# Patient Record
Sex: Female | Born: 1940 | Race: Black or African American | Hispanic: No | Marital: Married | Smoking: Never smoker
Health system: Southern US, Community
[De-identification: ages and names within clinical notes are randomized; demographics above are authoritative.]

## PROBLEM LIST (undated history)

## (undated) DIAGNOSIS — R000 Tachycardia, unspecified: Secondary | ICD-10-CM

## (undated) DIAGNOSIS — N840 Polyp of corpus uteri: Secondary | ICD-10-CM

## (undated) DIAGNOSIS — G4733 Obstructive sleep apnea (adult) (pediatric): Secondary | ICD-10-CM

## (undated) DIAGNOSIS — M1990 Unspecified osteoarthritis, unspecified site: Secondary | ICD-10-CM

## (undated) DIAGNOSIS — E669 Obesity, unspecified: Secondary | ICD-10-CM

## (undated) DIAGNOSIS — Z8742 Personal history of other diseases of the female genital tract: Secondary | ICD-10-CM

## (undated) DIAGNOSIS — I10 Essential (primary) hypertension: Secondary | ICD-10-CM

## (undated) DIAGNOSIS — Z86010 Personal history of colonic polyps: Secondary | ICD-10-CM

## (undated) DIAGNOSIS — E785 Hyperlipidemia, unspecified: Secondary | ICD-10-CM

## (undated) DIAGNOSIS — F99 Mental disorder, not otherwise specified: Secondary | ICD-10-CM

## (undated) HISTORY — DX: Mental disorder, not otherwise specified: F99

## (undated) HISTORY — DX: Obesity, unspecified: E66.9

## (undated) HISTORY — PX: OTHER SURGICAL HISTORY: SHX169

## (undated) HISTORY — PX: TUBAL LIGATION: SHX77

## (undated) HISTORY — DX: Unspecified osteoarthritis, unspecified site: M19.90

## (undated) HISTORY — PX: BACK SURGERY: SHX140

## (undated) HISTORY — PX: CHOLECYSTECTOMY: SHX55

## (undated) HISTORY — DX: Essential (primary) hypertension: I10

## (undated) HISTORY — DX: Obstructive sleep apnea (adult) (pediatric): G47.33

## (undated) HISTORY — DX: Hyperlipidemia, unspecified: E78.5

## (undated) HISTORY — DX: Personal history of colonic polyps: Z86.010

## (undated) HISTORY — DX: Personal history of other diseases of the female genital tract: Z87.42

## (undated) HISTORY — DX: Tachycardia, unspecified: R00.0

## (undated) HISTORY — DX: Polyp of corpus uteri: N84.0

---

## 1997-12-06 DIAGNOSIS — Z86010 Personal history of colonic polyps: Secondary | ICD-10-CM

## 1997-12-06 DIAGNOSIS — Z860101 Personal history of adenomatous and serrated colon polyps: Secondary | ICD-10-CM

## 1997-12-06 HISTORY — DX: Personal history of colonic polyps: Z86.010

## 1997-12-06 HISTORY — DX: Personal history of adenomatous and serrated colon polyps: Z86.0101

## 2001-10-02 ENCOUNTER — Ambulatory Visit (HOSPITAL_COMMUNITY): Admission: RE | Admit: 2001-10-02 | Discharge: 2001-10-02 | Payer: Self-pay | Admitting: *Deleted

## 2001-10-02 ENCOUNTER — Encounter: Payer: Self-pay | Admitting: *Deleted

## 2002-03-06 HISTORY — PX: COLONOSCOPY: SHX174

## 2002-03-21 ENCOUNTER — Ambulatory Visit (HOSPITAL_COMMUNITY): Admission: RE | Admit: 2002-03-21 | Discharge: 2002-03-21 | Payer: Self-pay | Admitting: Internal Medicine

## 2002-06-16 ENCOUNTER — Other Ambulatory Visit: Admission: RE | Admit: 2002-06-16 | Discharge: 2002-06-16 | Payer: Self-pay | Admitting: Obstetrics & Gynecology

## 2002-10-03 ENCOUNTER — Encounter: Payer: Self-pay | Admitting: *Deleted

## 2002-10-03 ENCOUNTER — Ambulatory Visit (HOSPITAL_COMMUNITY): Admission: RE | Admit: 2002-10-03 | Discharge: 2002-10-03 | Payer: Self-pay | Admitting: *Deleted

## 2003-12-25 ENCOUNTER — Ambulatory Visit (HOSPITAL_COMMUNITY): Admission: RE | Admit: 2003-12-25 | Discharge: 2003-12-25 | Payer: Self-pay | Admitting: Family Medicine

## 2004-03-24 ENCOUNTER — Ambulatory Visit (HOSPITAL_COMMUNITY): Admission: RE | Admit: 2004-03-24 | Discharge: 2004-03-24 | Payer: Self-pay | Admitting: Obstetrics & Gynecology

## 2004-12-11 ENCOUNTER — Ambulatory Visit (HOSPITAL_COMMUNITY): Admission: RE | Admit: 2004-12-11 | Discharge: 2004-12-11 | Payer: Self-pay | Admitting: Family Medicine

## 2005-12-13 ENCOUNTER — Ambulatory Visit (HOSPITAL_COMMUNITY): Admission: RE | Admit: 2005-12-13 | Discharge: 2005-12-13 | Payer: Self-pay | Admitting: Family Medicine

## 2006-12-15 ENCOUNTER — Ambulatory Visit (HOSPITAL_COMMUNITY): Admission: RE | Admit: 2006-12-15 | Discharge: 2006-12-15 | Payer: Self-pay | Admitting: Obstetrics & Gynecology

## 2007-08-21 ENCOUNTER — Other Ambulatory Visit: Admission: RE | Admit: 2007-08-21 | Discharge: 2007-08-21 | Payer: Self-pay | Admitting: Obstetrics and Gynecology

## 2007-12-18 ENCOUNTER — Ambulatory Visit (HOSPITAL_COMMUNITY): Admission: RE | Admit: 2007-12-18 | Discharge: 2007-12-18 | Payer: Self-pay | Admitting: Obstetrics & Gynecology

## 2008-09-19 ENCOUNTER — Other Ambulatory Visit: Admission: RE | Admit: 2008-09-19 | Discharge: 2008-09-19 | Payer: Self-pay | Admitting: Obstetrics and Gynecology

## 2008-09-24 ENCOUNTER — Ambulatory Visit (HOSPITAL_COMMUNITY): Admission: RE | Admit: 2008-09-24 | Discharge: 2008-09-24 | Payer: Self-pay | Admitting: Obstetrics & Gynecology

## 2008-09-24 IMAGING — US US TRANSVAGINAL NON-OB
1 series · 13 of 25 positions shown · non-contrast
Comparison: None.

CLINICAL DATA: Post menopausal bleeding.

TRANSABDOMINAL AND TRANSVAGINAL ULTRASOUND OF PELVIS
TECHNIQUE: Both transabdominal and transvaginal ultrasound
examinations of the pelvis were performed including evaluation of
the uterus, ovaries, adnexal regions, and pelvic cul-de-sac.

[Series 1: us transvaginal non-ob · 0.28mm/px · 13 of 73 slices shown]
[im 1/73]
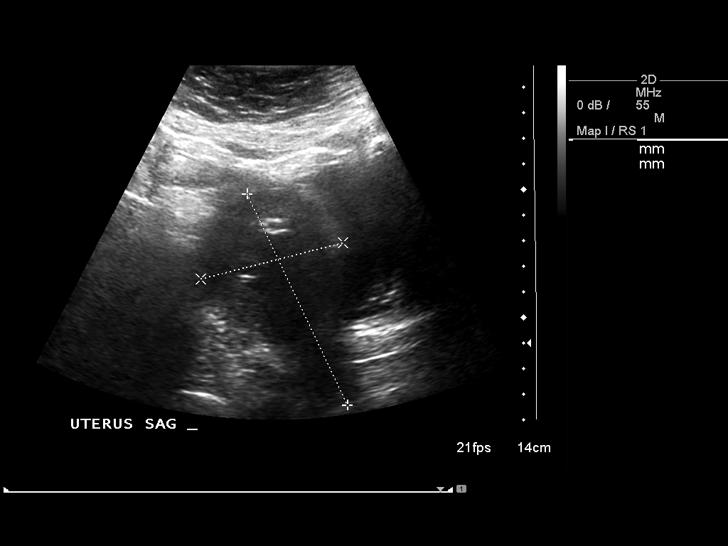
[im 7/73]
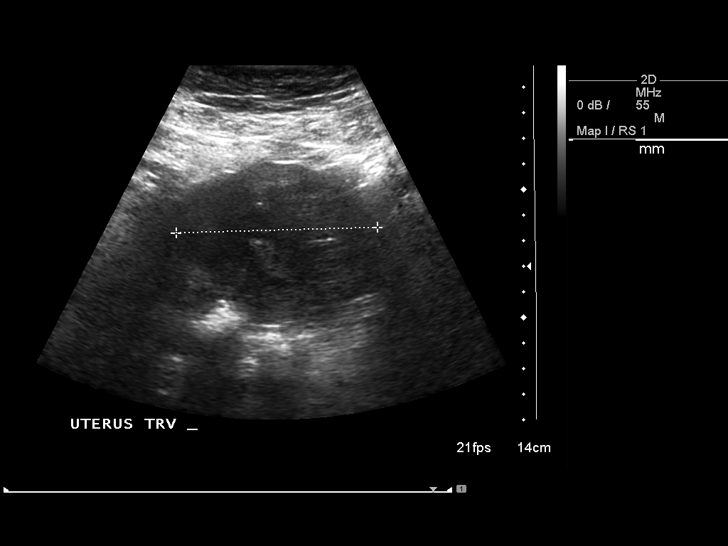
[im 13/73]
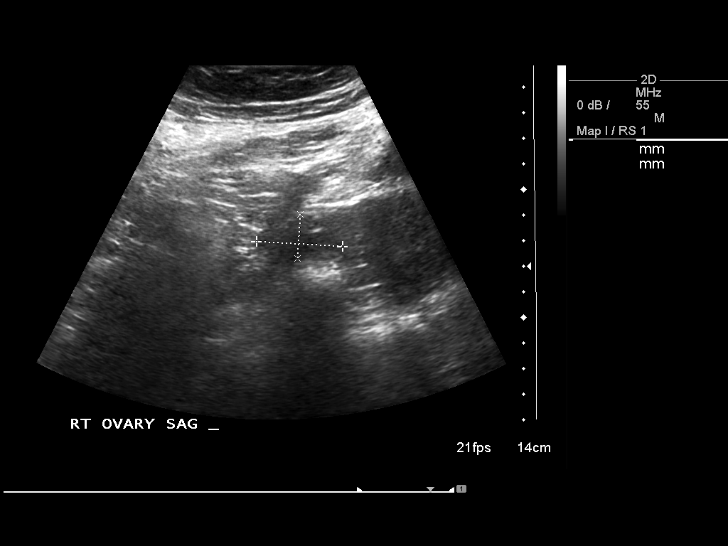
[im 19/73]
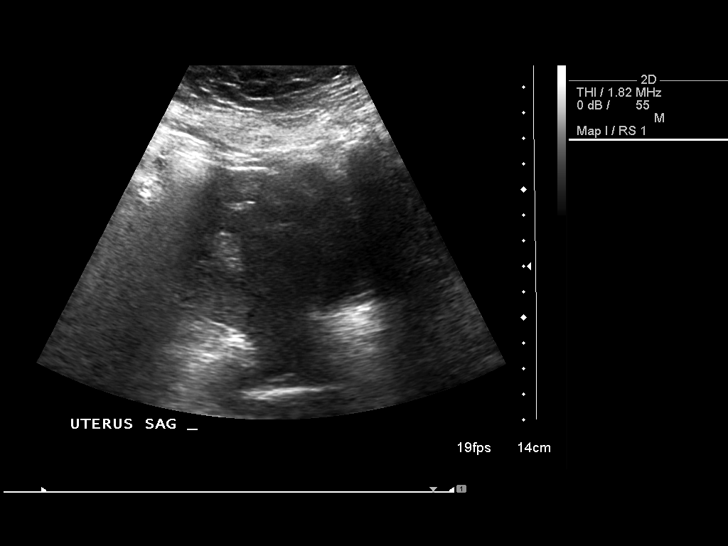
[im 25/73]
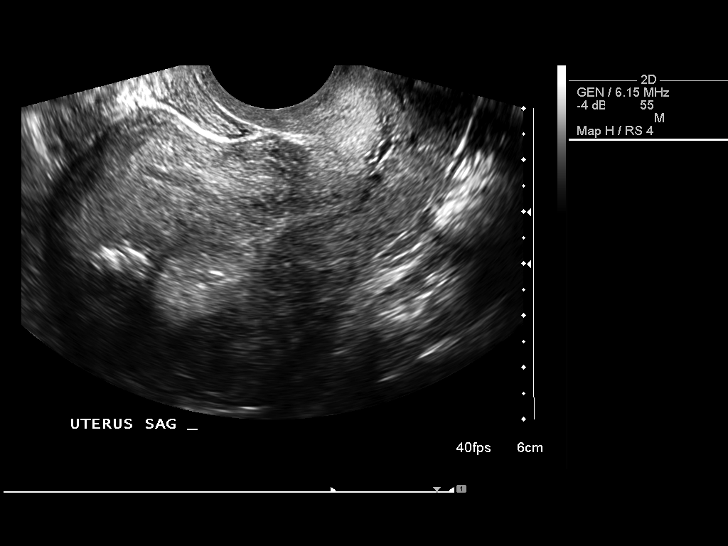
[im 31/73]
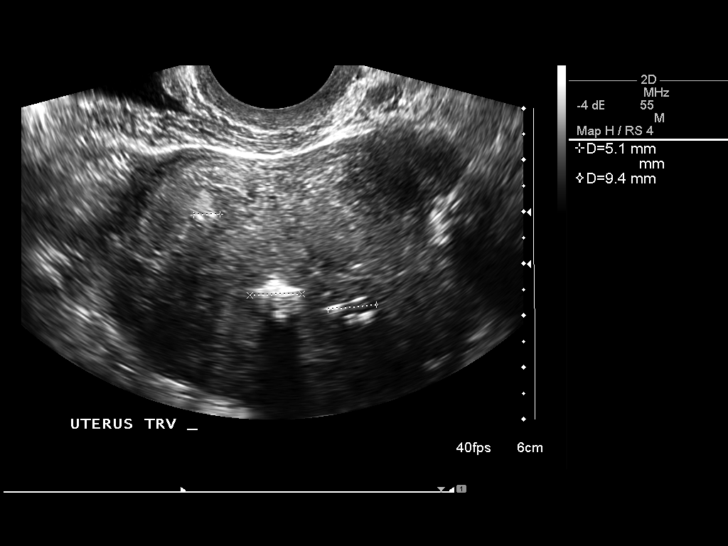
[im 37/73]
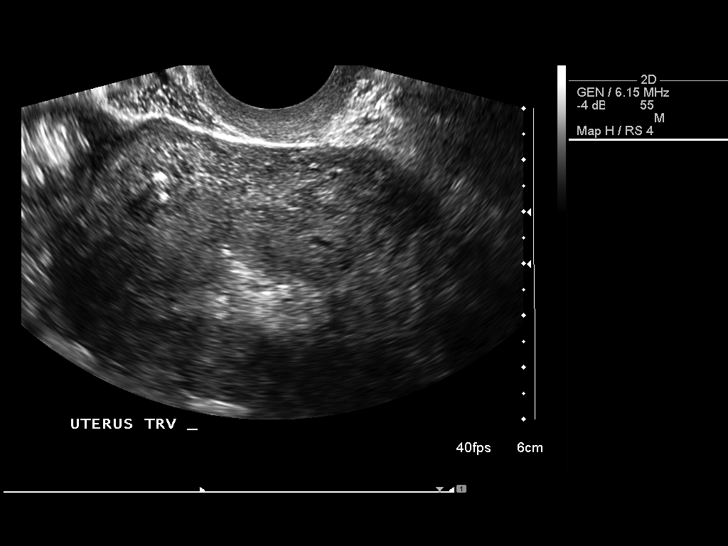
[im 43/73]
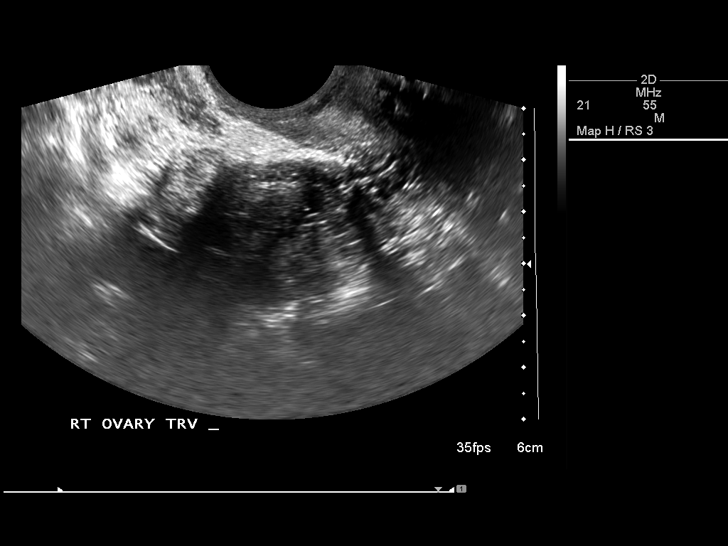
[im 49/73]
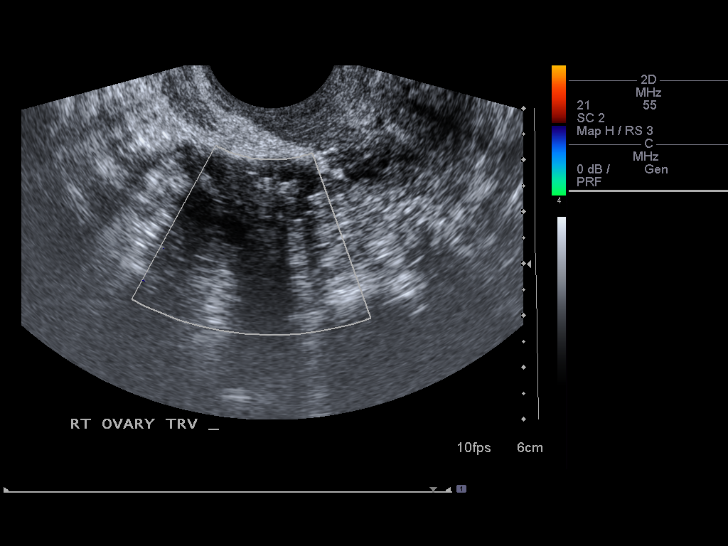
[im 55/73]
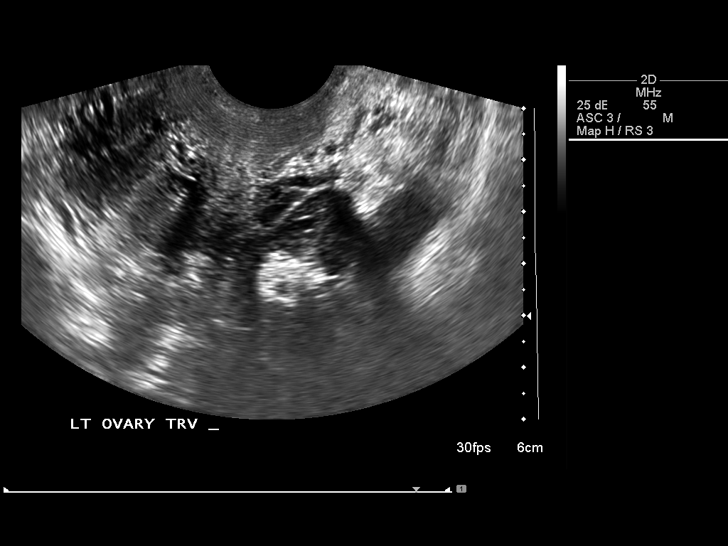
[im 61/73]
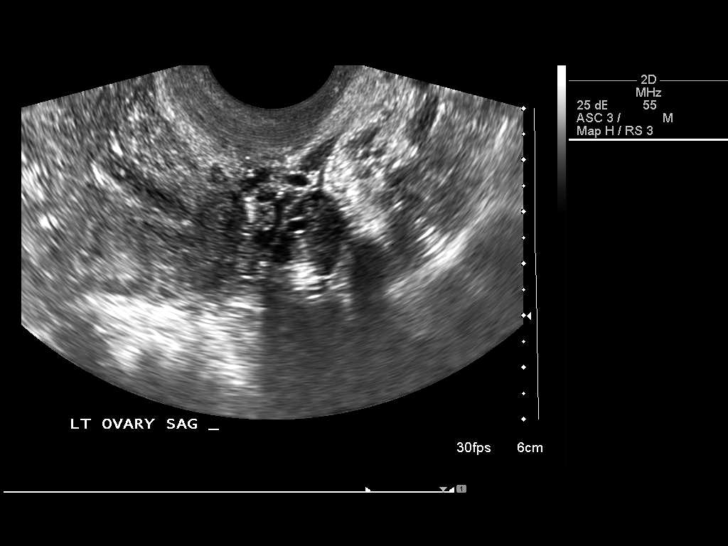
[im 67/73]
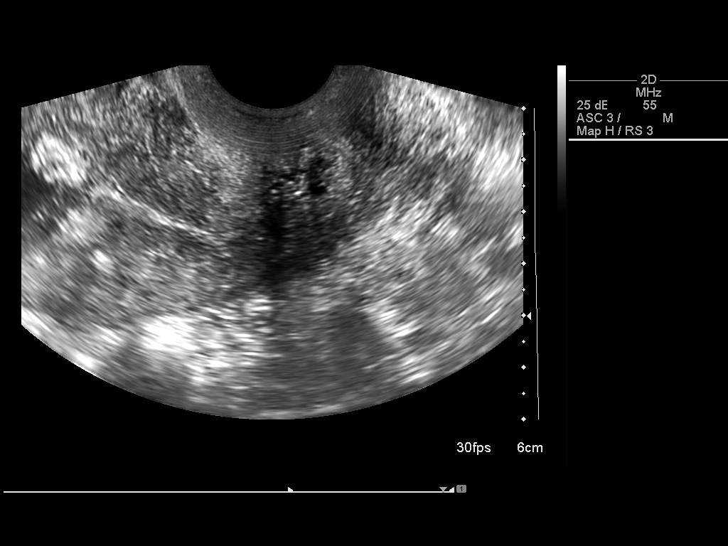
[im 73/73]
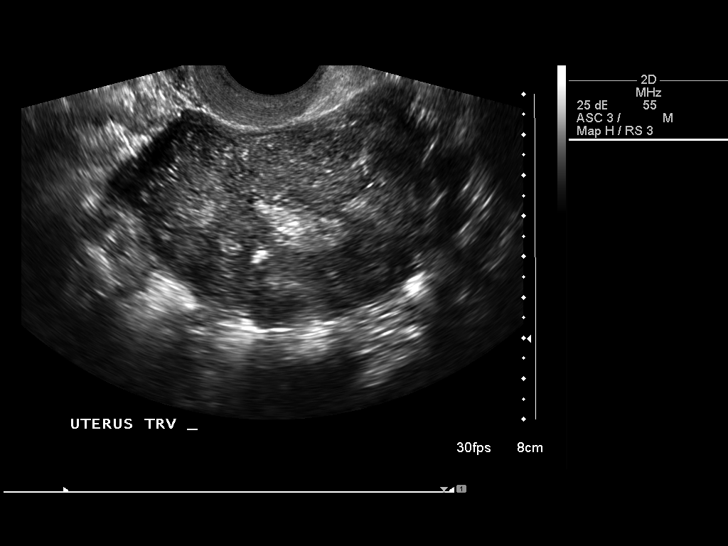

[13 of 25 positions shown; findings below may reference images not displayed]

FINDINGS: Uterus measures 9.2 x 5.7 x 7.9 cm.  There are multiple
fibroids which are difficult to measure as separate fibroids some
which contains calcifications extending into a sub serosal and
submucosal position and potentially contributing to the patient's
post menopausal bleeding.  Endometrial lining thickness is more
than expected based on the post menopausal state with maximal
thickness of 1.3 cm.  Within the endometrial lining thickness there
is echogenic material.  No well-defined polyp is identified however
this will require further delineation as malignancy cannot be
completely excluded.

Right ovary measures 2.9 x 1.7 x 2.6 cm and contains a 1 cm cyst.

The left ovary measures 2.6 x 1.7 x 1.7 cm.  Along the margin of
the left ovary is a 1.2 cm echogenic structure which appears to
arise from the ovary.  This does not shadow as expected with
calcifications.  This may represent a fatty lesion possibly a
dermoid.  If further delineation at the present time is clinically
desired, this can be evaluated with MR. This will require follow-
up.
IMPRESSION: Multiple uterine fibroids with prominent endometrial lining
thickness as described above.

1.2 cm echogenic structure arises from the left ovary.  Please see
above discussion.

## 2008-10-18 ENCOUNTER — Other Ambulatory Visit: Admission: RE | Admit: 2008-10-18 | Discharge: 2008-10-18 | Payer: Self-pay | Admitting: Obstetrics & Gynecology

## 2008-12-23 ENCOUNTER — Ambulatory Visit (HOSPITAL_COMMUNITY): Admission: RE | Admit: 2008-12-23 | Discharge: 2008-12-23 | Payer: Self-pay | Admitting: Obstetrics & Gynecology

## 2008-12-23 IMAGING — MG MM DIGITAL SCREENING
6 series · 6 of 6 positions shown · non-contrast
Comparison: Prior studies.

DG SCREEN MAMMOGRAM BILATERAL
Bilateral CC and MLO view(s) were taken.

DIGITAL SCREENING MAMMOGRAM

[L CC]
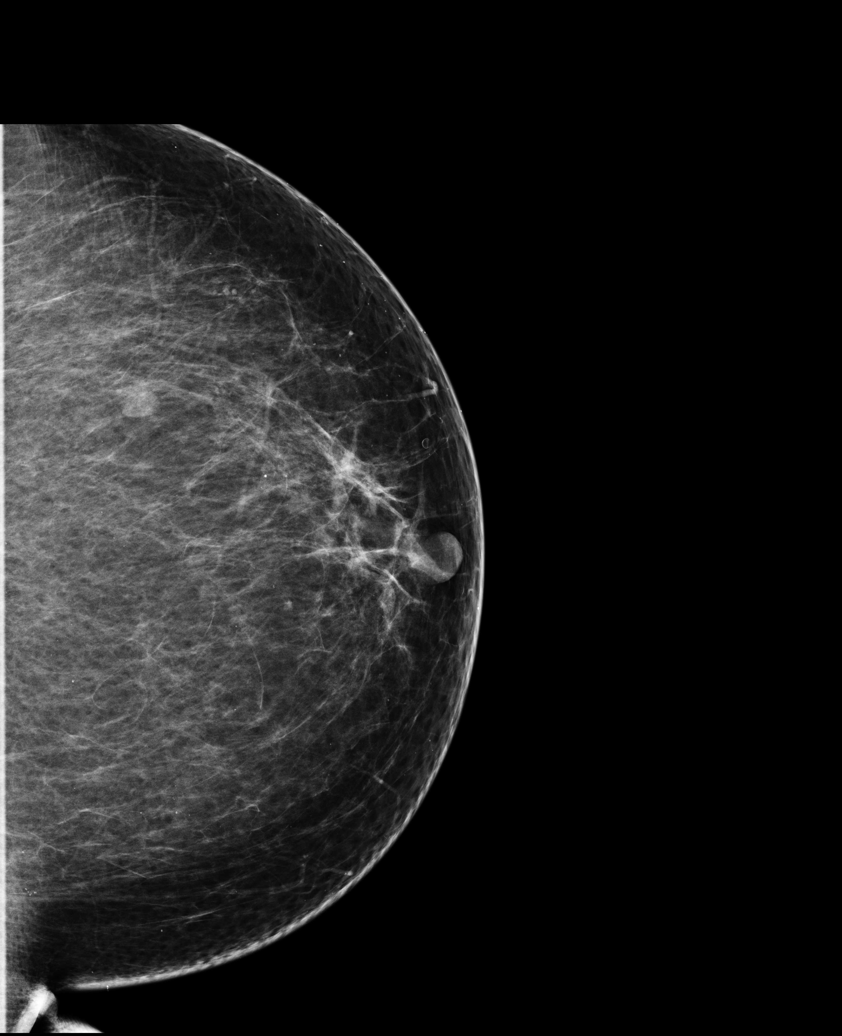

[L MLO (1 of 2)]
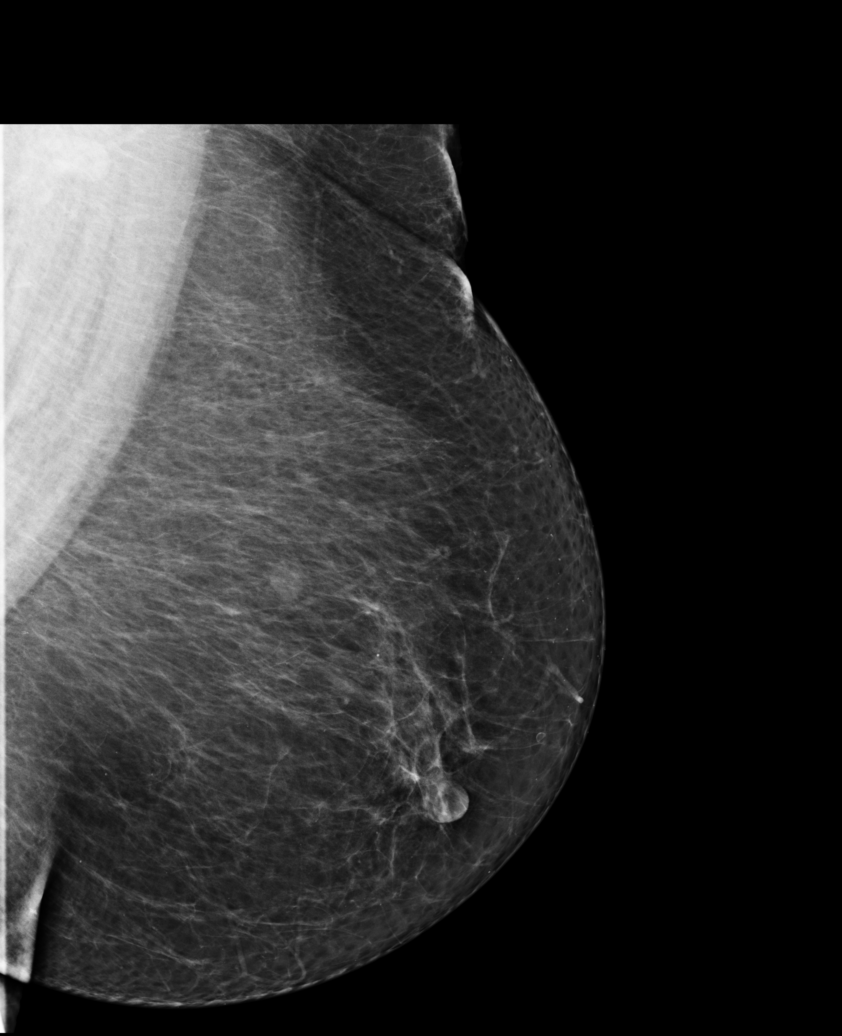

[R CC]
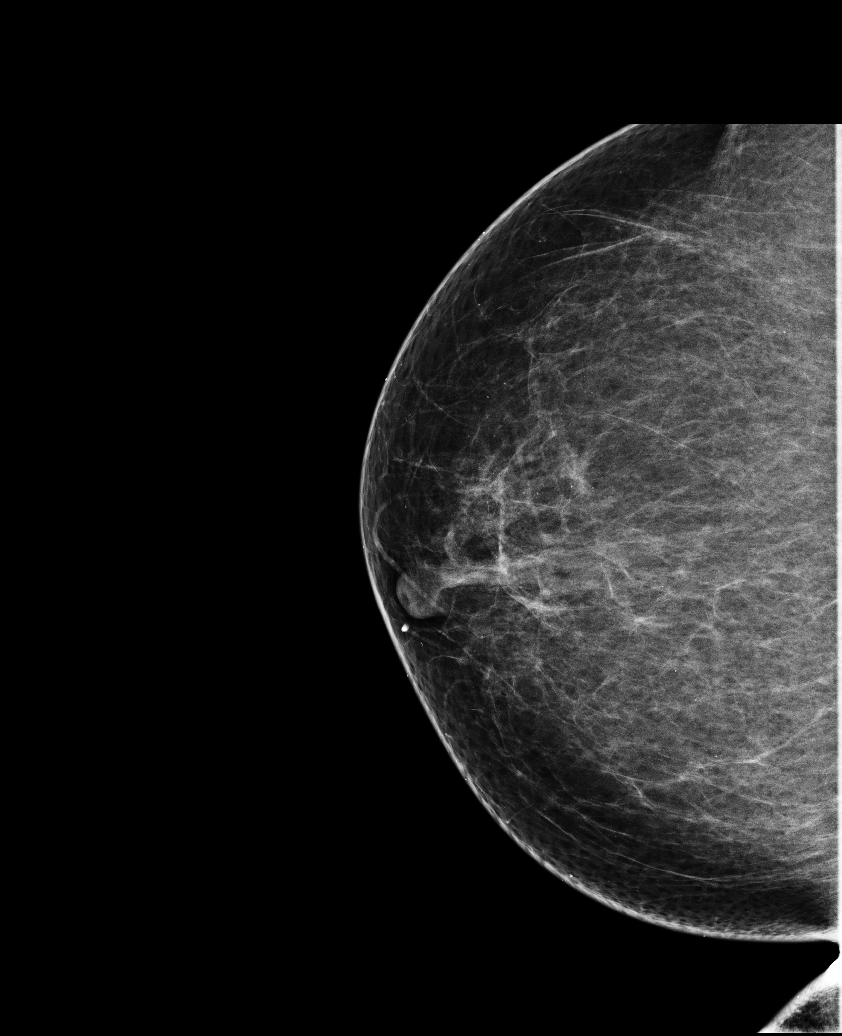

[R MLO (1 of 2)]
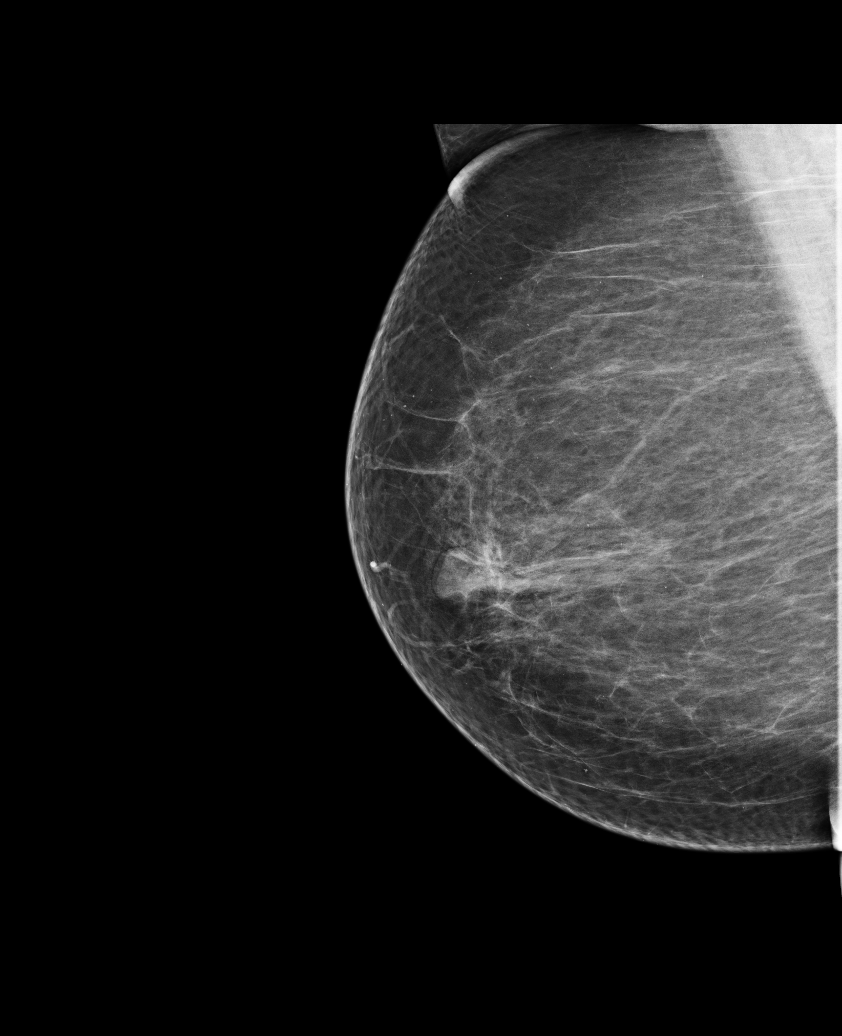

[L MLO (2 of 2)]
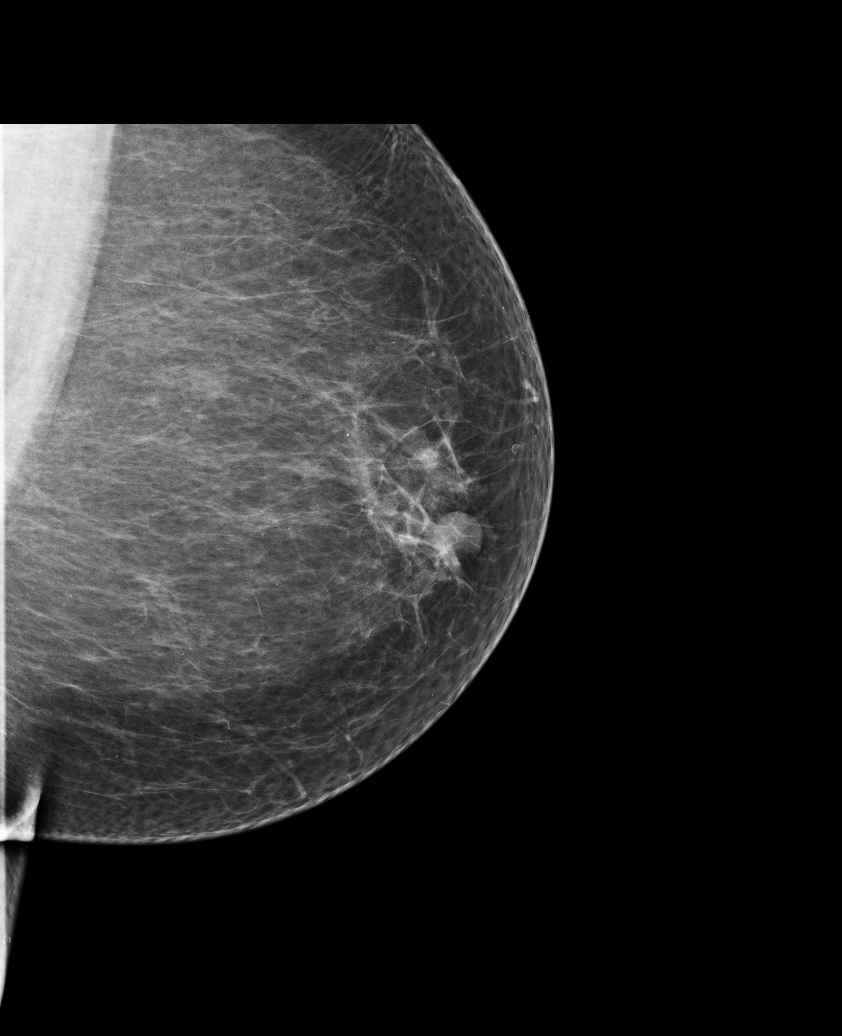

[R MLO (2 of 2)]
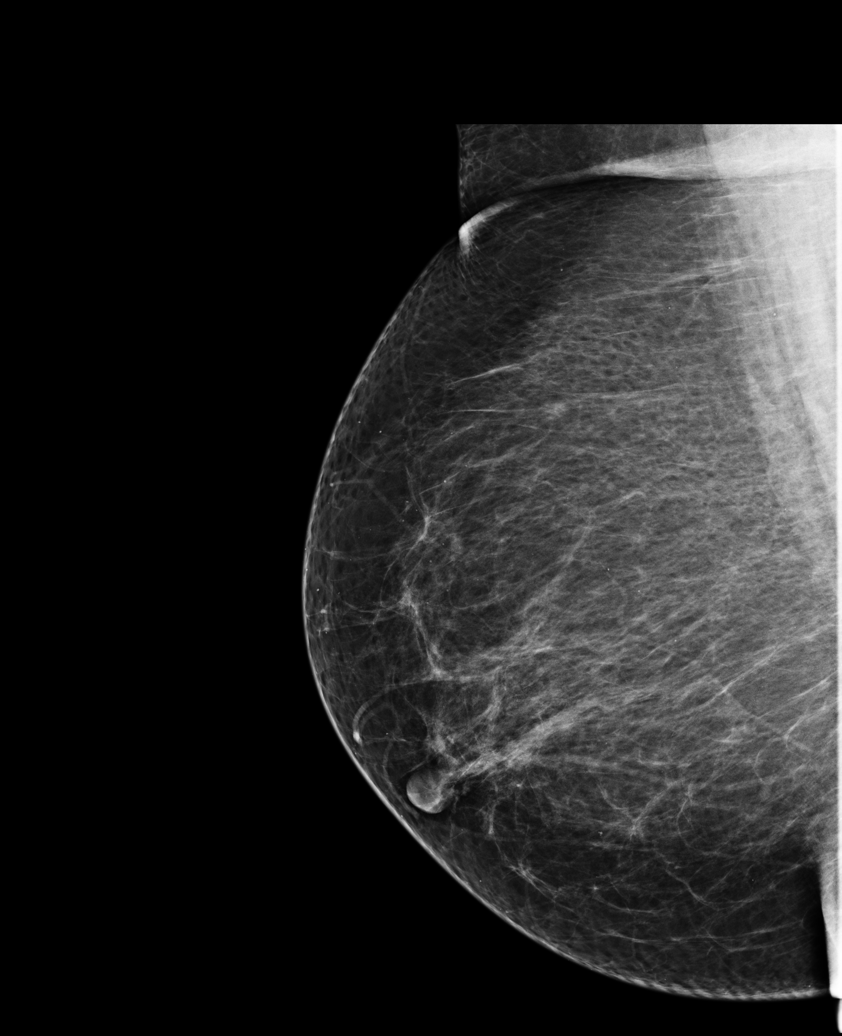

[6 of 6 positions shown; findings below may reference images not displayed]

The breast tissue is almost entirely fatty.  There is no dominant mass, architectural distortion or
calcification to suggest malignancy.
IMPRESSION: No mammographic evidence of malignancy.  Suggest yearly screening mammography.

ASSESSMENT: Negative - BI-RADS 1

Screening mammogram in 1 year.
, THIS PROCEDURE WAS A DIGITAL MAMMOGRAM.

## 2009-09-25 ENCOUNTER — Other Ambulatory Visit: Admission: RE | Admit: 2009-09-25 | Discharge: 2009-09-25 | Payer: Self-pay | Admitting: Obstetrics and Gynecology

## 2009-10-01 ENCOUNTER — Ambulatory Visit (HOSPITAL_COMMUNITY): Admission: RE | Admit: 2009-10-01 | Discharge: 2009-10-01 | Payer: Self-pay | Admitting: Obstetrics & Gynecology

## 2009-10-01 IMAGING — US US PELVIS COMPLETE MODIFY
1 series · 13 of 25 positions shown · non-contrast
Comparison: [DATE]

CLINICAL DATA: Post menopausal bleeding

TRANSABDOMINAL AND TRANSVAGINAL ULTRASOUND OF PELVIS
TECHNIQUE: Both transabdominal and transvaginal ultrasound
examinations of the pelvis were performed including evaluation of
the uterus, ovaries, adnexal regions, and pelvic cul-de-sac.

[Series 1: us pelvis complete modify · 0.39mm/px · 13 of 56 slices shown]
[im 1/56]
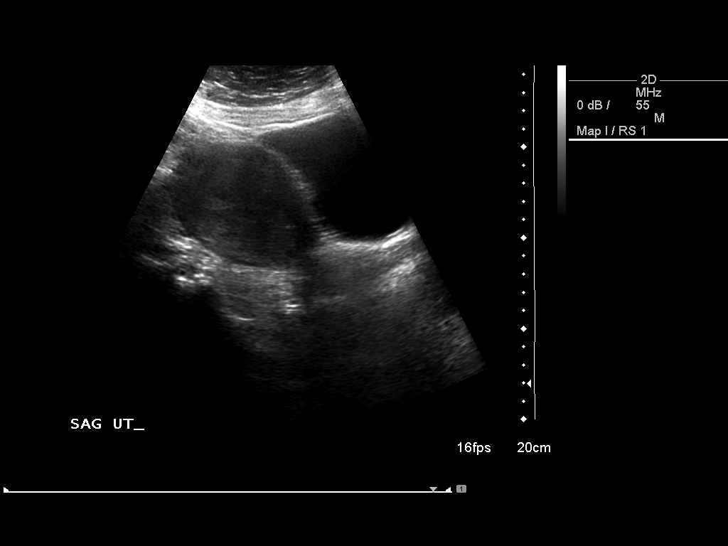
[im 5/56]
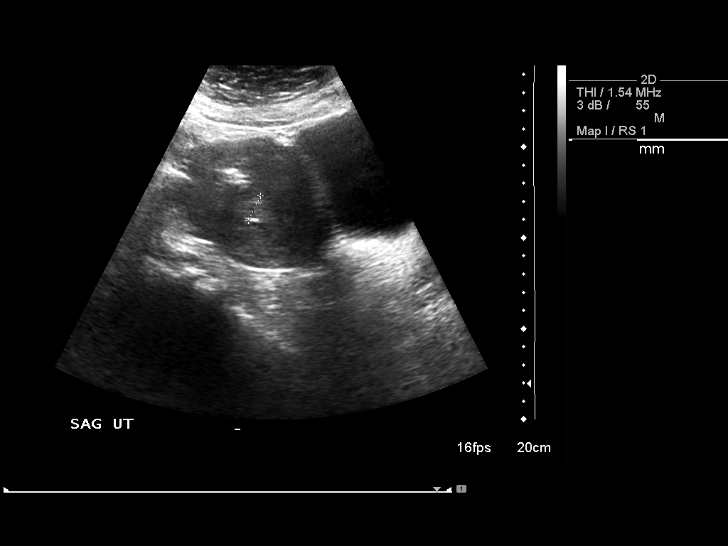
[im 10/56]
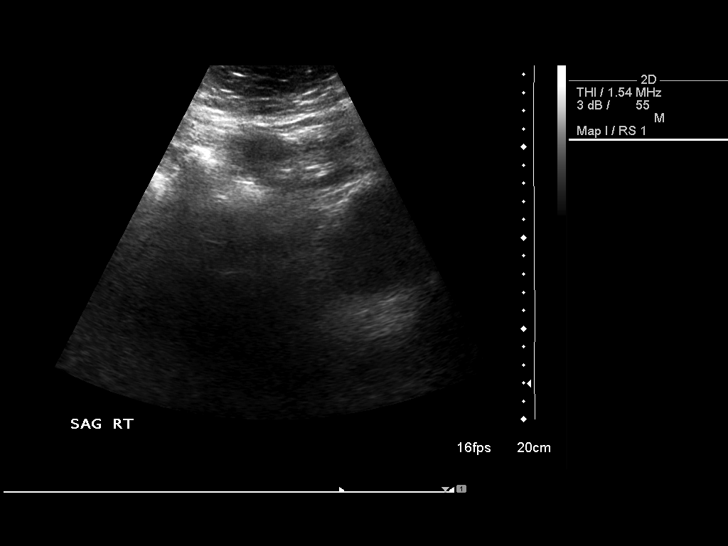
[im 14/56]
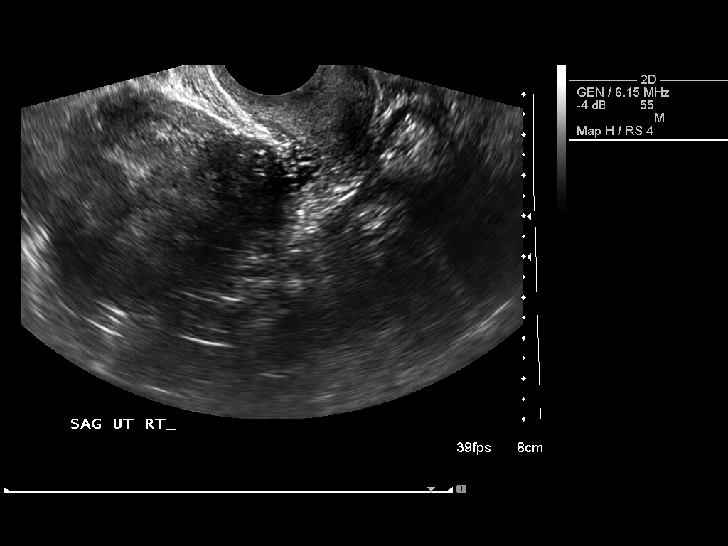
[im 19/56]
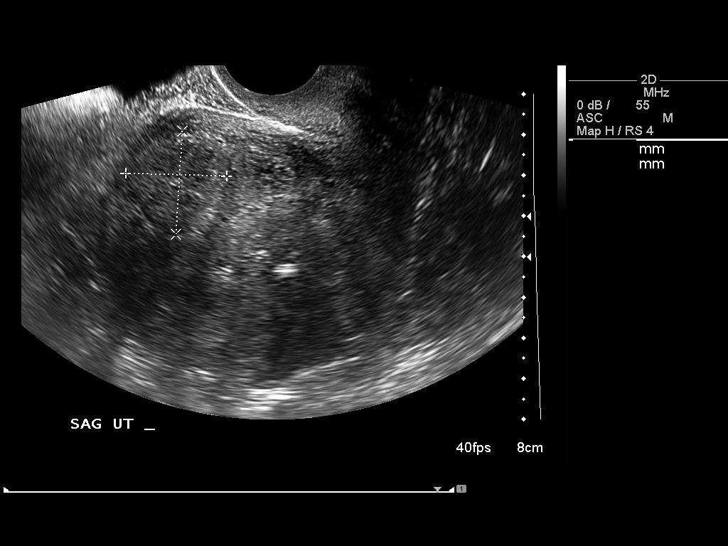
[im 23/56]
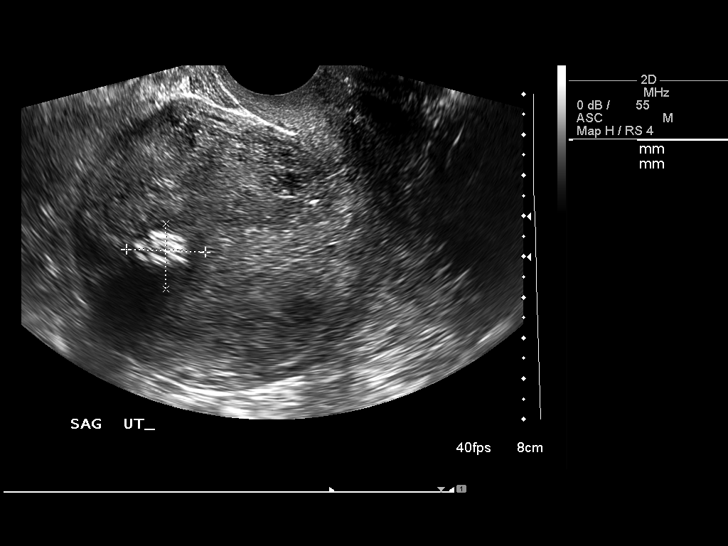
[im 28/56]
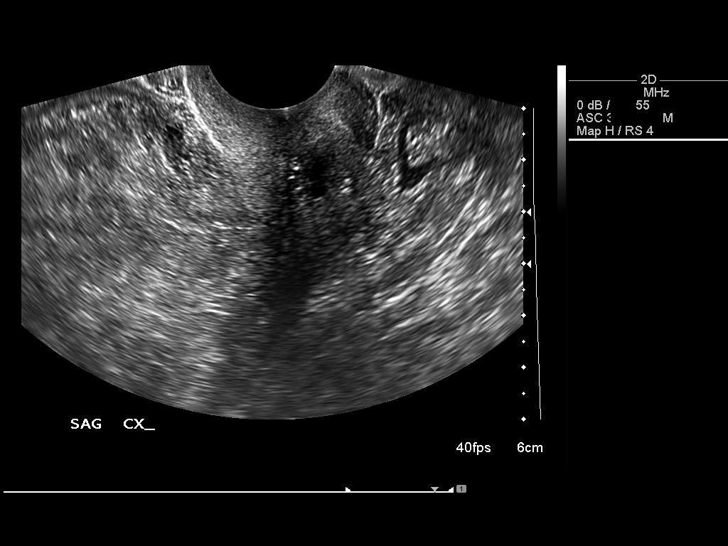
[im 33/56]
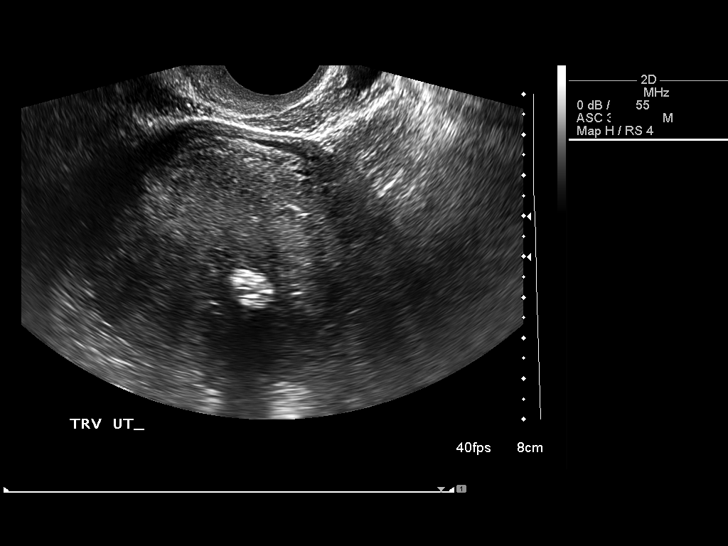
[im 37/56]
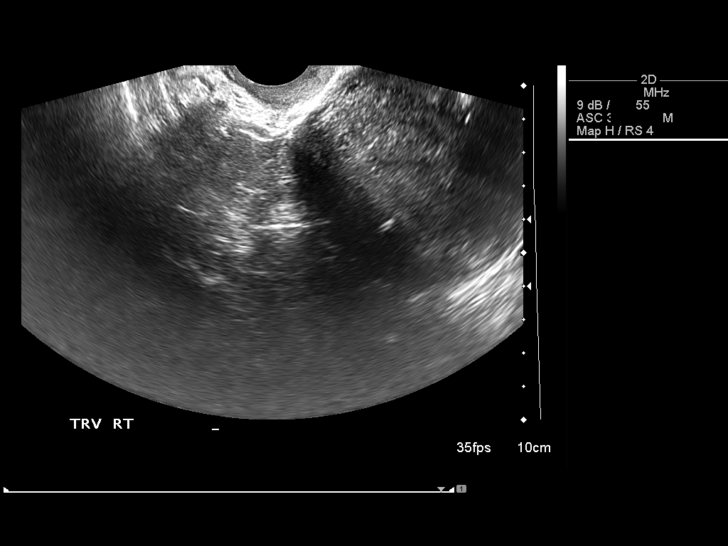
[im 42/56]
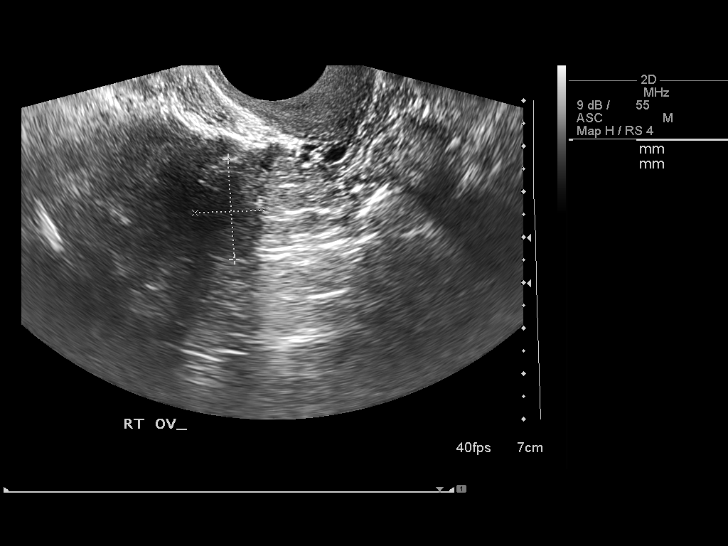
[im 46/56]
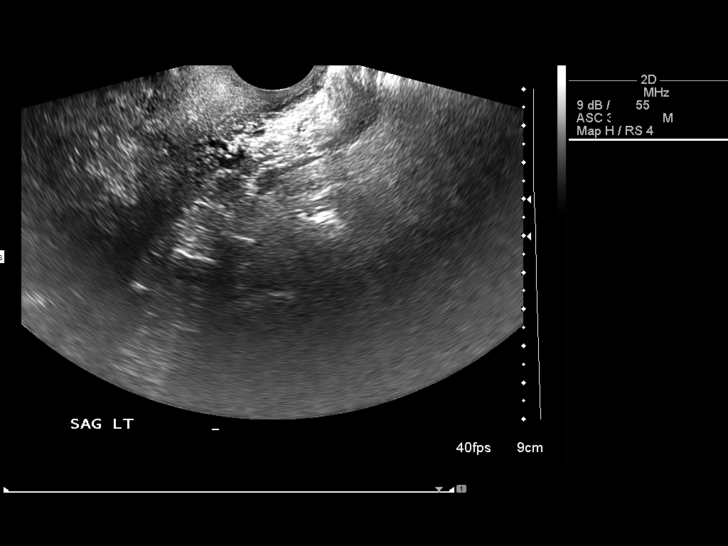
[im 51/56]
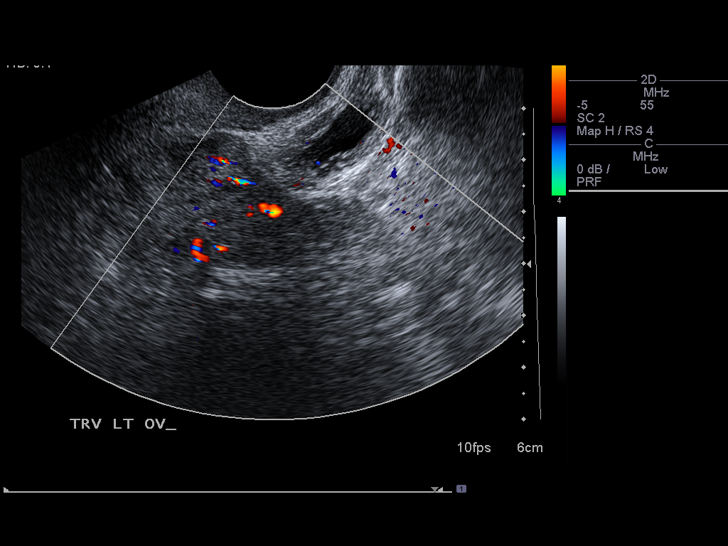
[im 56/56]
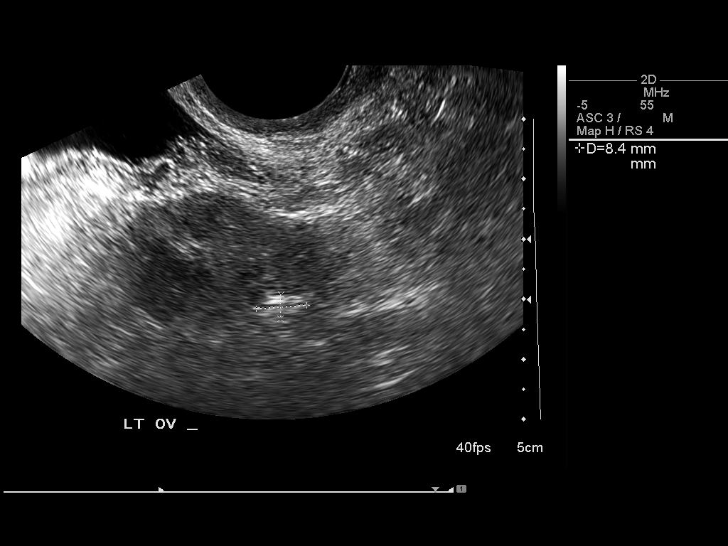

[13 of 25 positions shown; findings below may reference images not displayed]

FINDINGS: Uterus 11.4 cm length by 6.4 cm AP by 8.3 cm transverse.
Multiple calcified noncalcified uterine fibroids are identified.
Largest fibroid is in the anterior fundus, 2.7 x 2.5 x 2.5 cm.

Endometrium abnormally thickened for a post menopausal patient at
15 mm diameter

Right Ovary measures 2.2 x 1.6 x 2.1 cm.  Normal morphology without
mass.

Left Ovary measures 2.6 x 1.2 x 1.8 cm.  Small hyperechoic focus 8
mm diameter, nonspecific, without definite shadowing.  This could
represent an nonshadowing calcification or a small dermoid tumor.
This appears smaller but is less well visualized than on the
previous study, question related to bowel loops in the left adnexa.

Other Findings:  No other adnexal masses or free pelvic fluid.
IMPRESSION: Enlarged uterus containing multiple leiomyomas.
Significantly thickened abnormal endometrial stripe for post
menopausal patient, cannot exclude malignancy, tissue diagnosis
recommended.
Hyperechoic focus at left ovary, question dermoid tumor, measuring
smaller than seen on the preceding exam though though the left
adnexa is less well visualized due to bowel gas.

## 2009-12-25 ENCOUNTER — Ambulatory Visit (HOSPITAL_COMMUNITY): Admission: RE | Admit: 2009-12-25 | Discharge: 2009-12-25 | Payer: Self-pay | Admitting: Obstetrics & Gynecology

## 2009-12-25 IMAGING — MG MM DIGITAL SCREENING
2 series · 2 of 2 positions shown · non-contrast
Comparison: none

DG SCREEN MAMMOGRAM BILATERAL
Bilateral CC and MLO view(s) were taken.

DIGITAL SCREENING MAMMOGRAM WITH CAD:
There are scattered fibroglandular densities.  No masses or malignant type calcifications are 
identified.  Compared with prior studies.
Images were processed with CAD.

[R CC]
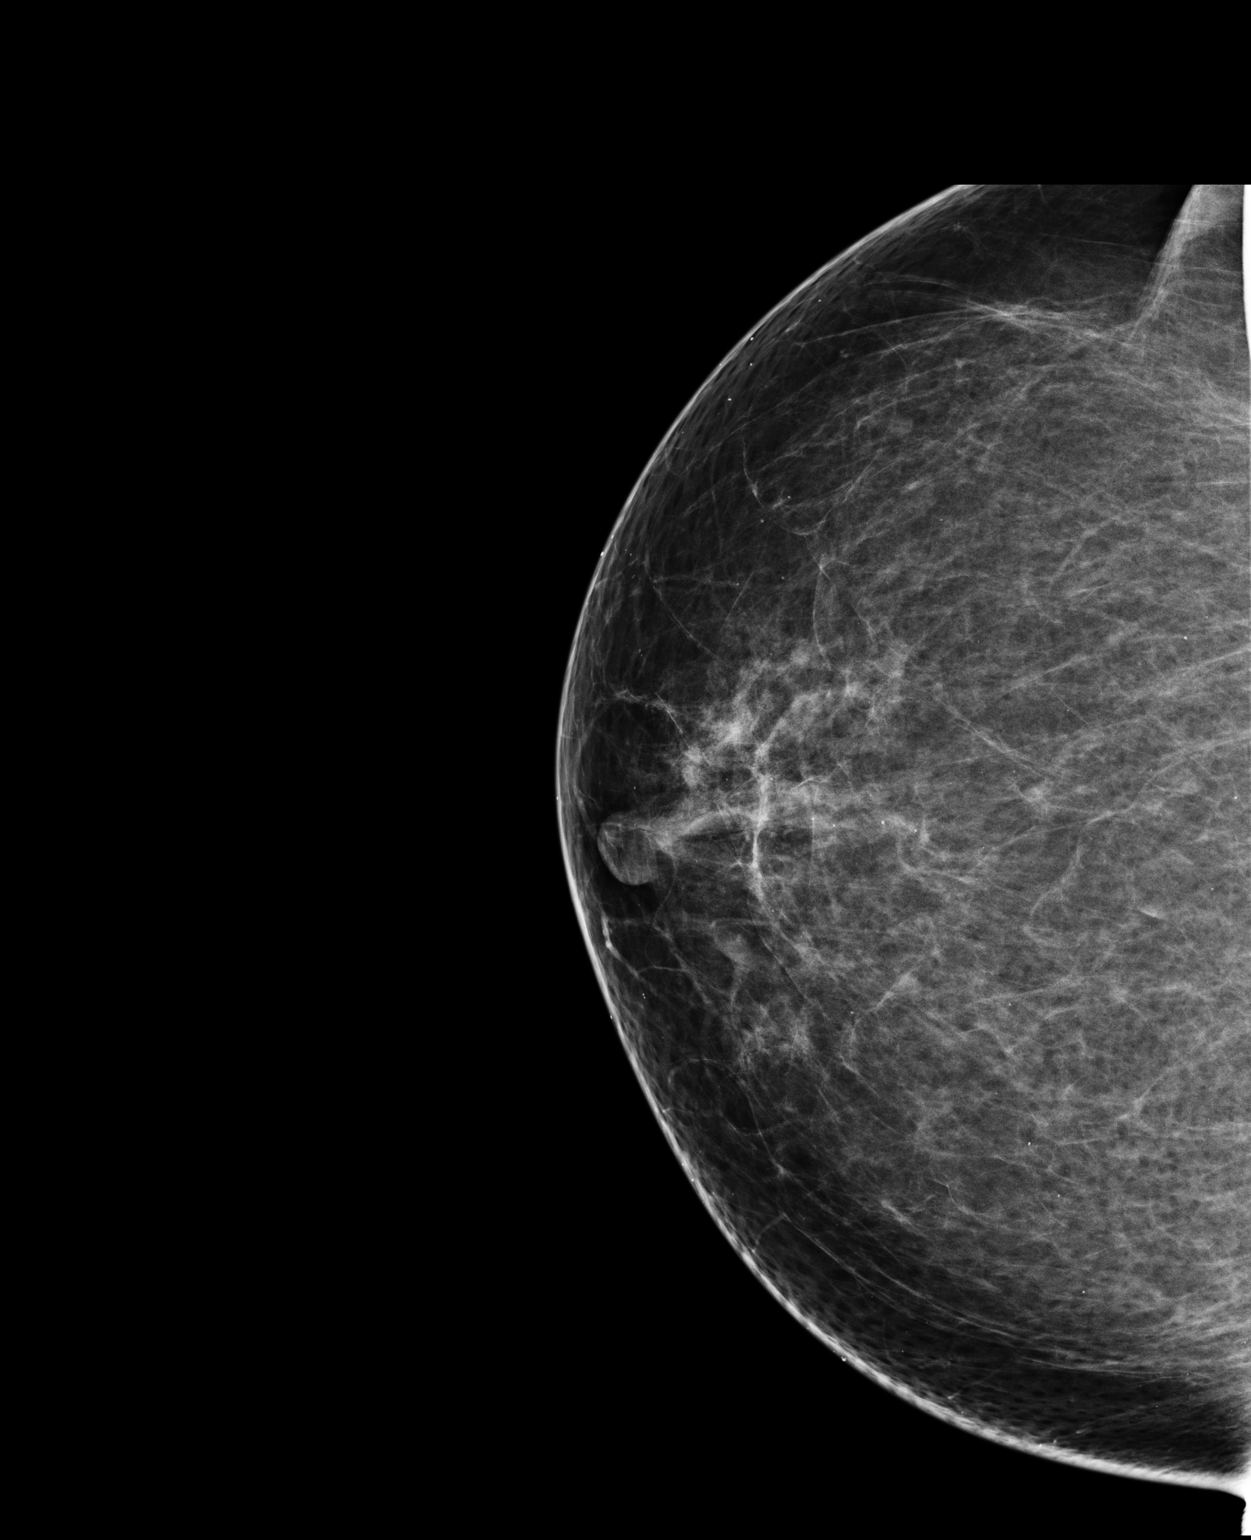

[R MLO]
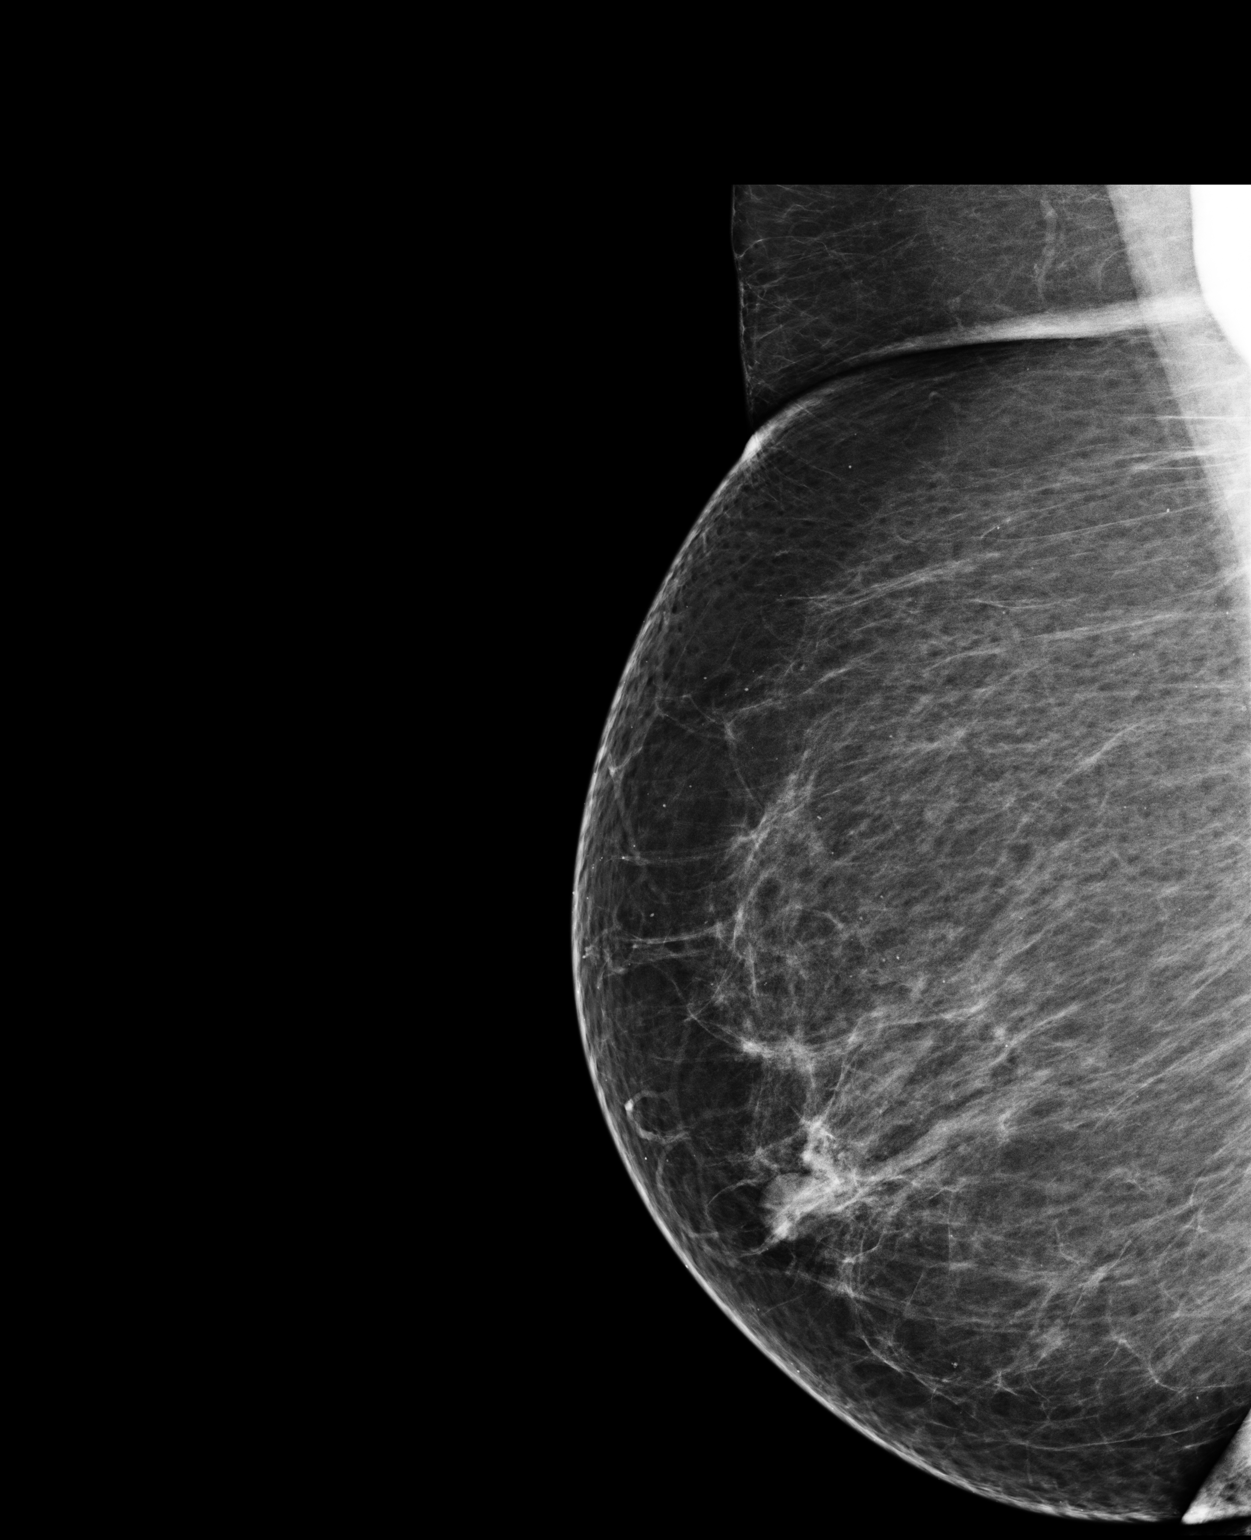

[2 of 2 positions shown; findings below may reference images not displayed]

IMPRESSION: No specific mammographic evidence of malignancy.  Next screening mammogram is recommended in one 
year.

A result letter of this screening mammogram will be mailed directly to the patient.

ASSESSMENT: Negative - BI-RADS 1

Screening mammogram in 1 year.
,

## 2010-01-13 ENCOUNTER — Ambulatory Visit: Payer: Self-pay | Admitting: Emergency Medicine

## 2010-10-13 ENCOUNTER — Other Ambulatory Visit: Admission: RE | Admit: 2010-10-13 | Discharge: 2010-10-13 | Payer: Self-pay | Admitting: Obstetrics & Gynecology

## 2010-12-28 ENCOUNTER — Ambulatory Visit (HOSPITAL_COMMUNITY)
Admission: RE | Admit: 2010-12-28 | Discharge: 2010-12-28 | Payer: Self-pay | Source: Home / Self Care | Attending: Obstetrics & Gynecology | Admitting: Obstetrics & Gynecology

## 2010-12-28 IMAGING — MG MM DIGITAL SCREENING
5 series · 5 of 5 positions shown · non-contrast
Comparison: none

DG SCREEN MAMMOGRAM BILATERAL
Bilateral CC and MLO view(s) were taken.

DIGITAL SCREENING MAMMOGRAM WITH CAD:
There are scattered fibroglandular densities.  No masses or malignant type calcifications are 
identified.  Compared with prior studies.
Images were processed with CAD.

[L CC]
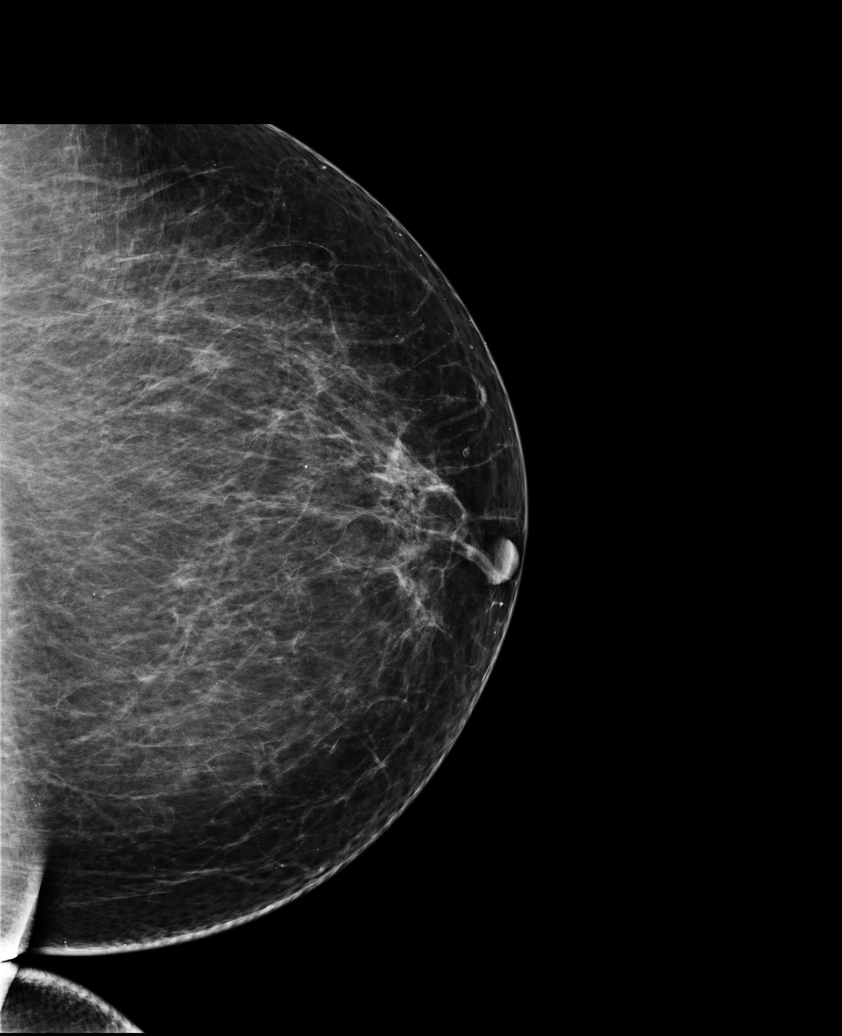

[L MLO]
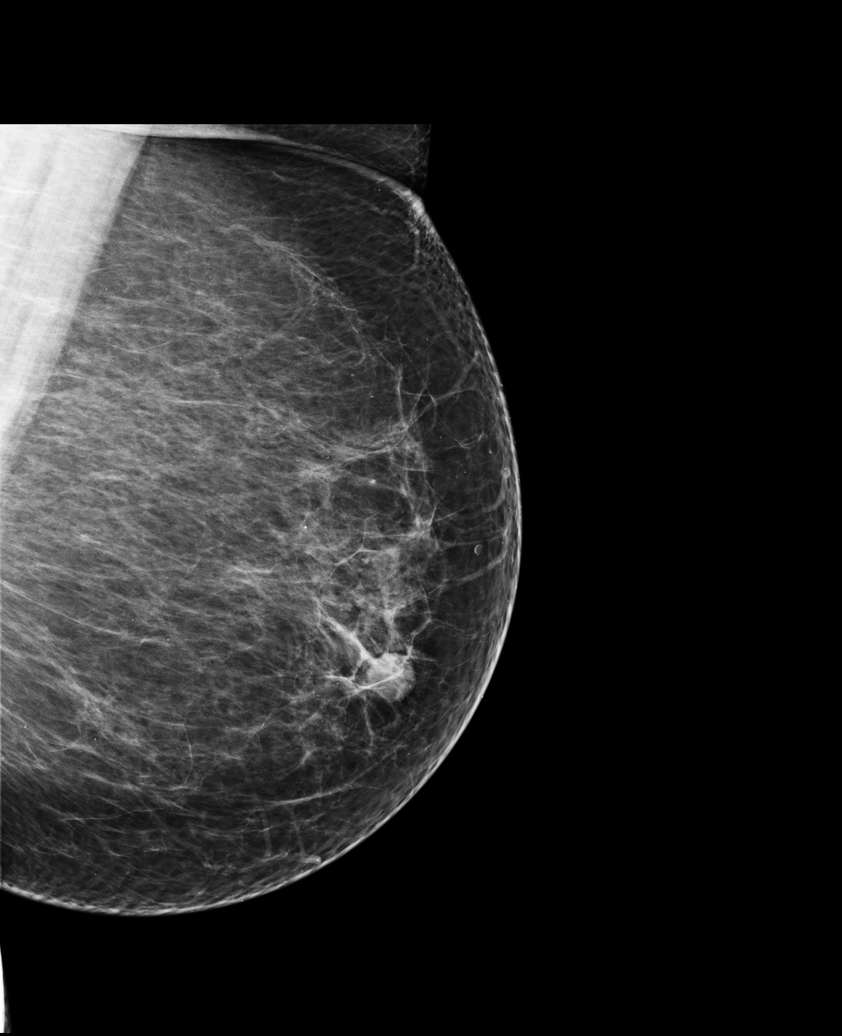

[R CC (1 of 2)]
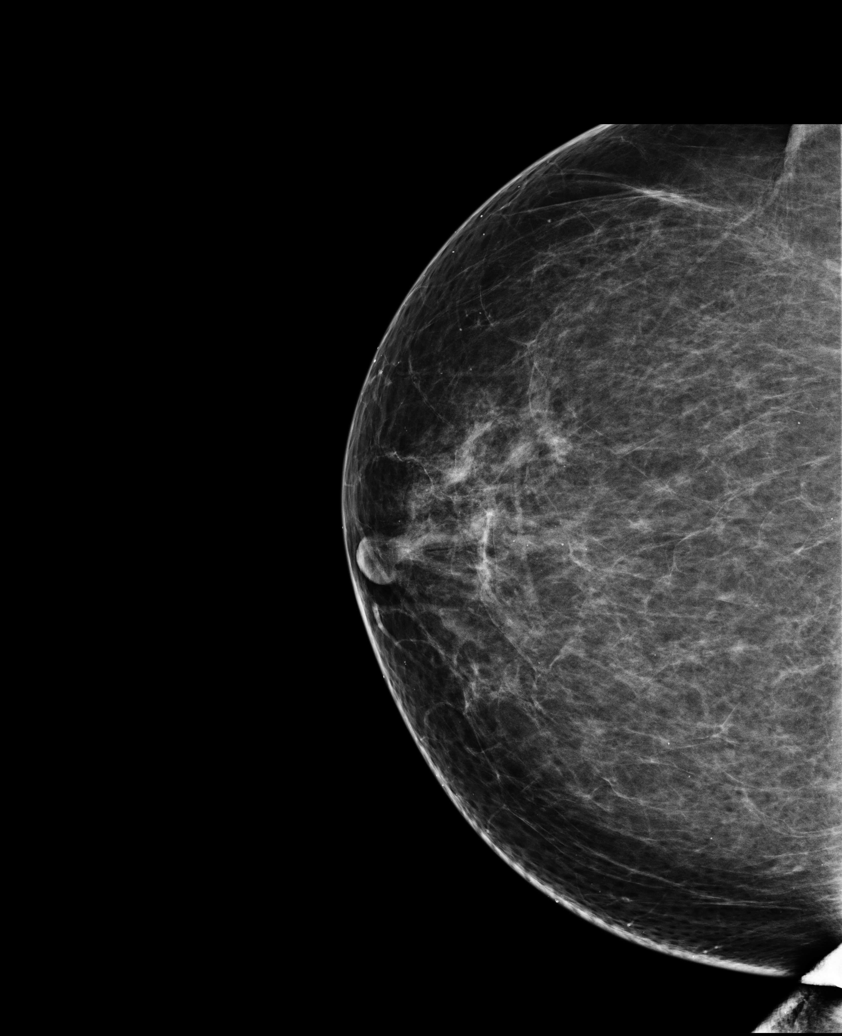

[R MLO]
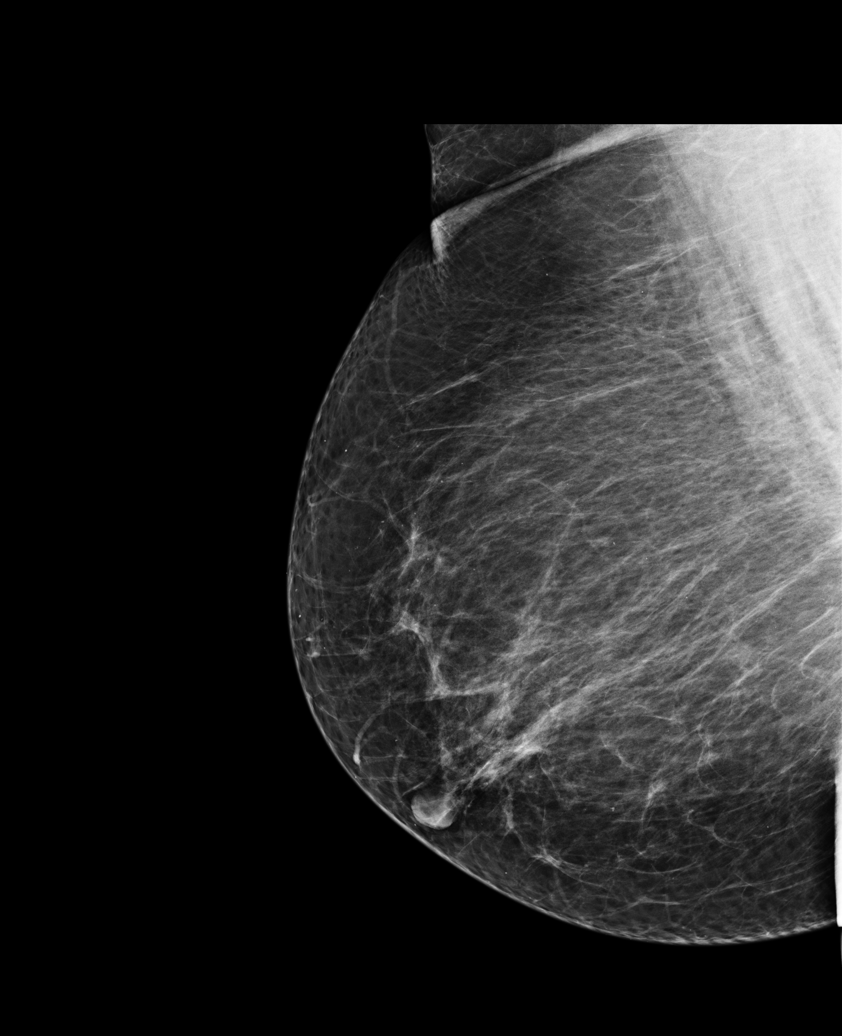

[R CC (2 of 2)]
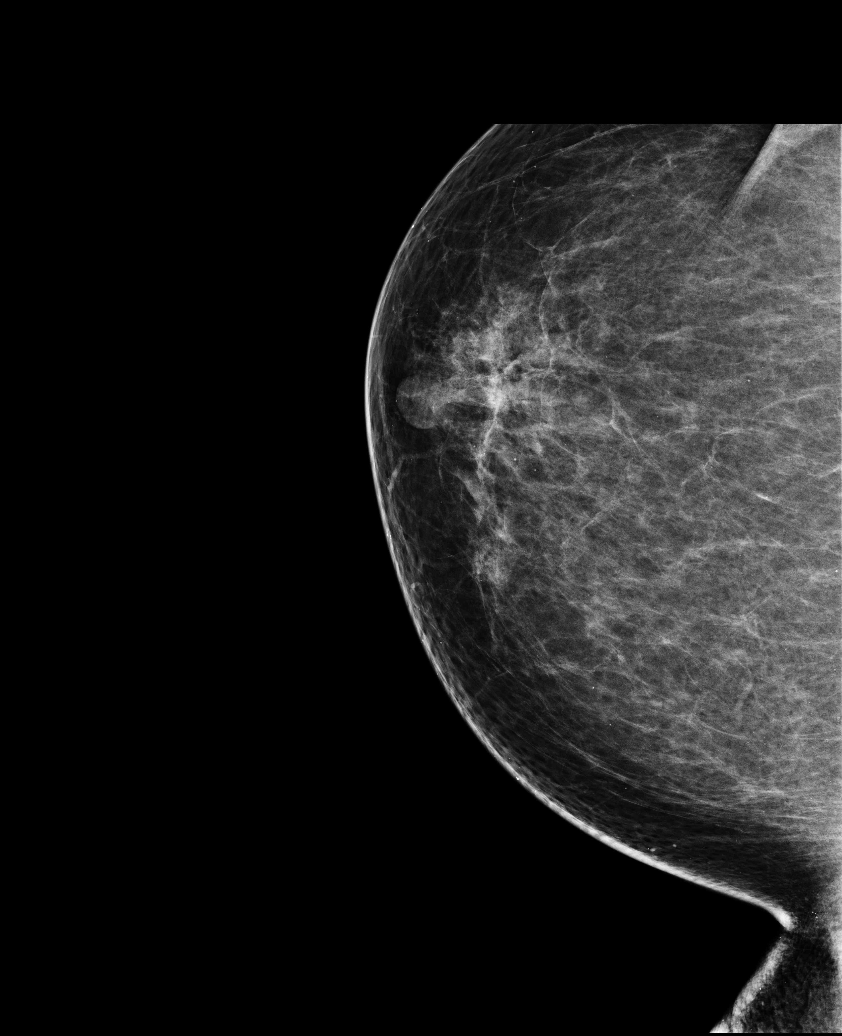

[5 of 5 positions shown; findings below may reference images not displayed]

IMPRESSION: No specific mammographic evidence of malignancy.  Next screening mammogram is recommended in one 
year.

A result letter of this screening mammogram will be mailed directly to the patient.

ASSESSMENT: Negative - BI-RADS 1

Screening mammogram in 1 year.
,

## 2011-04-23 NOTE — Op Note (Signed)
NAME:  Shelley Mitchell, Shelley Mitchell                            ACCOUNT NO.:  [id number]   MEDICAL RECORD NO.:  [id number]                   PATIENT TYPE:  AMB   LOCATION:  DAY                                  FACILITY:  [facility]   PHYSICIAN:  Lazaro Arms, M.D.                DATE OF BIRTH:  1941-02-11   DATE OF PROCEDURE:  03/24/2004  DATE OF DISCHARGE:  03/24/2004                                 OPERATIVE REPORT   PREOPERATIVE DIAGNOSES:  1. Postmenopausal bleeding.  2. Complex endometrial stripe.   POSTOPERATIVE DIAGNOSES:  1. Postmenopausal bleeding.  2. Complex endometrial stripe.  3. Endometrial polyp.   PROCEDURE:  Hysteroscopy dilatation and curettage with removal of  endometrial polyp.   SURGEON:  Lazaro Arms, M.D.   ANESTHESIA:  Laryngeal mask airway.   FINDINGS:  At the time of surgery with hysteroscopy was found to have a  relatively large endometrial polyp.  The rest of the endometrium appeared  normal and appeared benign.   DESCRIPTION OF PROCEDURE:  The patient was taken to the operating room,  underwent her anesthetic, was placed in the dorsal lithotomy position and  prepped and draped in the usual sterile fashion.  A weighted speculum was  placed, bladder was drained.  Her cervix was grasped.  It was dilated  serially to allow passage of the hysteroscope.  The hysteroscope was placed  in the endometrial cavity without difficulty.  Normal saline was used as the  distending media.  Endometrial polyps were seen. The rest of the endometrium  appeared to be normal.  The polyps were removed using sharp curettage.  I  would scrape and then look back again to make sure that everything was  removed.  All specimens were sent to pathology.  Blood loss was minimal.  The endometrium had good uterine cry in all areas.  All polyps were removed.  The patient was awakened from anesthesia, instruments removed.  She  underwent routine postoperative care in the recovery room and blood loss,  again, was minimal.  All counts were correct.      ___________________________________________                                            Lazaro Arms, M.D.   LHE/MEDQ  D:  04/14/2004  T:  04/14/2004  Job:  [id number]

## 2011-04-23 NOTE — Op Note (Signed)
[facility]  Patient:    Shelley Mitchell, Shelley Mitchell Visit Number: [id number] MRN: [id number]          Service Type: END Location: DAY Attending Physician:  Jonathon Bellows Dictated by:   Roetta Sessions, M.D. Proc. Date: 03/21/02 Admit Date:  03/21/2002   CC:         [facility], [place], [place] e   Operative Report  PROCEDURE:  Colonoscopy.  INDICATIONS:  The patient has a history of adenoma removed from her colon at hepatic flexure in 1999.  She is here for surveillance.  She has not had any interim bowel complaints.  Colonoscopy is now being done as surveillance maneuver.  This approach has been discussed with Ms. Brusseau previously. Potential risks, benefits, and alternatives have been reviewed with questions answered.  She is agreeable.  Please see my handwritten H&P for more information.  I feel she is low risk for conscious sedation with Versed and Demerol.  She developed bradycardia during her last colonoscopy which was asymptomatic.  GASTROENTEROLOGIST:  Roetta Sessions, M.D.  PROCEDURE NOTE:  O2 saturation, blood pressure, pulses of this patient were monitored throughout the entire procedure.  CONSCIOUS SEDATION:  Versed 3 mg IV, Demerol 75 mg IV in divided doses.  OTHER MEDICATIONS:  Ampicillin 2 g IV, gentamicin 80 mg IV for prophylaxis. Atropine 0.5 mg IV.  INSTRUMENT: Olympus video chip colonoscope.  FINDINGS:  Digital rectal examination revealed no abnormalities.  ENDOSCOPIC FINDINGS:  Prep was good.  Rectum:  Examination of rectal mucosa including retroflexed view of the anal verge revealed minimal internal hemorrhoids.  Otherwise the rectal mucosa appeared normal.  COLON:  Colonic mucosa was surveyed from the rectosigmoid junction through the left transverse right colon to the area of the appendiceal orifice, ileocecal valve, and cecum.  These structures were well seen and appeared normal as did the remainder  of the colon.  From the level of the cecum and ileocecal valve, the scope was slowly withdrawn.  All previously mentioned mucosal surfaces were again seen.  No other abnormalities were observed.  The patient tolerated the procedure well and was reacted in endoscopy.  IMPRESSION: 1. Minimal internal hemorrhoids. 2. Otherwise normal rectum. 3. Normal colon.  RECOMMENDATIONS: Repeat surveillance colonoscopy in five years. Dictated by:   Roetta Sessions, M.D. Attending Physician:  Jonathon Bellows DD:  03/21/02 TD:  03/21/02 Job: [id number]

## 2011-05-25 ENCOUNTER — Ambulatory Visit: Payer: Self-pay | Admitting: Gastroenterology

## 2011-05-31 ENCOUNTER — Encounter: Payer: Self-pay | Admitting: Gastroenterology

## 2011-05-31 ENCOUNTER — Ambulatory Visit (INDEPENDENT_AMBULATORY_CARE_PROVIDER_SITE_OTHER): Payer: Medicare Other | Admitting: Gastroenterology

## 2011-05-31 ENCOUNTER — Ambulatory Visit: Payer: Self-pay | Admitting: Urgent Care

## 2011-05-31 VITALS — BP 145/86 | HR 71 | Temp 97.1°F | Ht 63.0 in | Wt 264.4 lb

## 2011-05-31 DIAGNOSIS — Z86010 Personal history of colonic polyps: Secondary | ICD-10-CM | POA: Insufficient documentation

## 2011-05-31 DIAGNOSIS — Z800 Family history of malignant neoplasm of digestive organs: Secondary | ICD-10-CM

## 2011-05-31 NOTE — Assessment & Plan Note (Signed)
Due for five year surveillance TCS for h/o adenomatous colonic polyps. ?FH of CRC. Patient states she's not sure what kind of cancer her father had.  I have discussed the risks, alternatives, benefits with regards to but not limited to the risk of reaction to medication, bleeding, infection, perforation and the patient is agreeable to proceed. Written consent to be obtained.

## 2011-05-31 NOTE — Progress Notes (Signed)
Primary Care Physician:  Elease Hashimoto, MD, MD  Primary Gastroenterologist:  Roetta Sessions, MD  Chief Complaint  Patient presents with  . Colonoscopy    HPI:  Shelley Mitchell is a 70 y.o. female here Chronic constipation. BM regular with high fiber diet. Some vaginal spotting, followed by Dr. Despina Hidden. No rectal bleeding. No heartburn or N/V. Good appetite.   Current Outpatient Prescriptions  Medication Sig Dispense Refill  . aspirin 81 MG EC tablet Take 81 mg by mouth daily.        . CELEBREX 200 MG capsule       . doxazosin (CARDURA) 1 MG tablet       . hydrALAZINE (APRESOLINE) 100 MG tablet       . hydrochlorothiazide 50 MG tablet       . losartan (COZAAR) 100 MG tablet       . megestrol (MEGACE) 40 MG tablet       . Multiple Vitamin (MULTIVITAMIN) capsule Take 1 capsule by mouth daily.        Marland Kitchen NIFEdipine (PROCARDIA XL/ADALAT-CC) 90 MG 24 hr tablet       . omeprazole (PRILOSEC) 20 MG capsule Take 20 mg by mouth daily.        . pravastatin (PRAVACHOL) 40 MG tablet         Allergies as of 05/31/2011  . (No Known Allergies)   Past Medical History  Diagnosis Date  . Hx of adenomatous colonic polyps 1999  . Osteoarthritis   . HTN (hypertension)   . Hyperlipidemia   . Obesity   . Endometrial polyp     Dr. Despina Hidden  . Tachycardia     subsequently with bradycardia (?secondary to meds)     Past Surgical History  Procedure Date  . Tubal ligation   . Cholecystectomy   . Colonoscopy 03/2002    Dr. Fleet Contras internal hemorrhoids  . 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 Diabetes Brother   . Diabetes Sister   . Cancer Father     ?primary  . Stroke Mother 106    History   Social History  . Marital Status: Married    Spouse Name: N/A    Number of Children: 4  . Years of Education: N/A   Occupational History  .     Social History Main Topics  . Smoking status: Never Smoker   . Smokeless tobacco: Not on  file  . Alcohol Use: No  . Drug Use: No  . Sexually Active: Not on file   Other Topics Concern  . Not on file   Social History Narrative  . No narrative on file      ROS:  General: Negative for anorexia, weight loss, fever, chills, fatigue, weakness. Eyes: Negative for vision changes.  ENT: Negative for hoarseness, difficulty swallowing , nasal congestion. CV: Negative for chest pain, angina, palpitations, dyspnea on exertion, peripheral edema.  Respiratory: Negative for dyspnea at rest, dyspnea on exertion, cough, sputum, wheezing.  GI: See history of present illness. GU:  Negative for dysuria, hematuria, urinary incontinence, urinary frequency, nocturnal urination.  MS: Negative for joint pain, low back pain.  Derm: Negative for rash or itching.  Neuro: Negative for weakness, abnormal sensation, seizure, frequent headaches, memory loss, confusion.  Psych: Negative for anxiety, depression, suicidal ideation, hallucinations.  Endo: Negative for unusual weight change.  Heme: Negative for bruising or bleeding. Allergy: Negative for rash or hives.  Physical Examination:  BP 145/86  Pulse 71  Temp(Src) 97.1 F (36.2 C) (Temporal)  Ht 5\' 3"  (1.6 m)  Wt 264 lb 6.4 oz (119.931 kg)  BMI 46.84 kg/m2   General: Well-nourished, well-developed in no acute distress.  Head: Normocephalic, atraumatic.   Eyes: Conjunctiva pink, no icterus. Mouth: Oropharyngeal mucosa moist and pink , no lesions erythema or exudate. Neck: Supple without thyromegaly, masses, or lymphadenopathy.  Lungs: Clear to auscultation bilaterally.  Heart: Regular rate and rhythm, no murmurs rubs or gallops.  Abdomen: Bowel sounds are normal, nontender, nondistended, no hepatosplenomegaly or masses, no abdominal bruits or    hernia , no rebound or guarding.   Extremities: No lower extremity edema.  Neuro: Alert and oriented x 4 , grossly normal neurologically.  Skin: Warm and dry, no rash or jaundice.     Psych: Alert and cooperative, normal mood and affect.

## 2011-06-04 NOTE — Progress Notes (Signed)
AGREE

## 2011-06-07 NOTE — Progress Notes (Signed)
Cc to PCP 

## 2011-06-18 MED ORDER — SODIUM CHLORIDE 0.45 % IV SOLN
Freq: Once | INTRAVENOUS | Status: AC
  Administered 2011-06-21: 10:00:00 via INTRAVENOUS

## 2011-06-21 ENCOUNTER — Encounter (HOSPITAL_COMMUNITY)
Admission: RE | Disposition: A | Discharge status: Home / Self Care | Payer: Self-pay | Source: Ambulatory Visit | Attending: Internal Medicine

## 2011-06-21 ENCOUNTER — Other Ambulatory Visit: Payer: Self-pay | Admitting: Internal Medicine

## 2011-06-21 ENCOUNTER — Encounter: Payer: Medicare Other | Admitting: Internal Medicine

## 2011-06-21 ENCOUNTER — Encounter (HOSPITAL_COMMUNITY): Payer: Self-pay | Admitting: *Deleted

## 2011-06-21 ENCOUNTER — Ambulatory Visit (HOSPITAL_COMMUNITY)
Admission: RE | Admit: 2011-06-21 | Discharge: 2011-06-21 | Disposition: A | Discharge status: Home / Self Care | Payer: Medicare Other | Source: Ambulatory Visit | Attending: Internal Medicine | Admitting: Internal Medicine

## 2011-06-21 DIAGNOSIS — I10 Essential (primary) hypertension: Secondary | ICD-10-CM | POA: Insufficient documentation

## 2011-06-21 DIAGNOSIS — Z860100 Personal history of colon polyps, unspecified: Secondary | ICD-10-CM | POA: Insufficient documentation

## 2011-06-21 DIAGNOSIS — K648 Other hemorrhoids: Secondary | ICD-10-CM | POA: Insufficient documentation

## 2011-06-21 DIAGNOSIS — Z7982 Long term (current) use of aspirin: Secondary | ICD-10-CM | POA: Insufficient documentation

## 2011-06-21 DIAGNOSIS — E785 Hyperlipidemia, unspecified: Secondary | ICD-10-CM | POA: Insufficient documentation

## 2011-06-21 DIAGNOSIS — Z1211 Encounter for screening for malignant neoplasm of colon: Secondary | ICD-10-CM

## 2011-06-21 DIAGNOSIS — Z79899 Other long term (current) drug therapy: Secondary | ICD-10-CM | POA: Insufficient documentation

## 2011-06-21 DIAGNOSIS — D126 Benign neoplasm of colon, unspecified: Secondary | ICD-10-CM

## 2011-06-21 HISTORY — PX: COLONOSCOPY: SHX5424

## 2011-06-21 SURGERY — COLONOSCOPY
Anesthesia: Moderate Sedation

## 2011-06-21 MED ORDER — SIMETHICONE 40 MG/0.6ML PO SUSP
ORAL | Status: DC | PRN
  Administered 2011-06-21: 10:00:00

## 2011-06-21 MED ORDER — MEPERIDINE HCL 100 MG/ML IJ SOLN
INTRAMUSCULAR | Status: AC
  Filled 2011-06-21: qty 1

## 2011-06-21 MED ORDER — MIDAZOLAM HCL 5 MG/5ML IJ SOLN
INTRAMUSCULAR | Status: DC | PRN
Start: 2011-06-21 — End: 2011-06-21
  Administered 2011-06-21 (×2): 2 mg via INTRAVENOUS

## 2011-06-21 MED ORDER — MIDAZOLAM HCL 5 MG/5ML IJ SOLN
INTRAMUSCULAR | Status: AC
  Filled 2011-06-21: qty 10

## 2011-06-21 MED ORDER — MEPERIDINE HCL 100 MG/ML IJ SOLN
INTRAMUSCULAR | Status: DC | PRN
  Administered 2011-06-21: 50 mg via INTRAVENOUS
  Administered 2011-06-21: 25 mg via INTRAVENOUS

## 2011-07-01 ENCOUNTER — Encounter (HOSPITAL_COMMUNITY): Payer: Self-pay | Admitting: Internal Medicine

## 2011-10-19 ENCOUNTER — Other Ambulatory Visit (HOSPITAL_COMMUNITY)
Admission: RE | Admit: 2011-10-19 | Discharge: 2011-10-19 | Disposition: A | Discharge status: Home / Self Care | Payer: Medicare Other | Source: Ambulatory Visit | Attending: Obstetrics & Gynecology | Admitting: Obstetrics & Gynecology

## 2011-10-19 ENCOUNTER — Other Ambulatory Visit: Payer: Self-pay | Admitting: Obstetrics & Gynecology

## 2011-10-19 DIAGNOSIS — Z124 Encounter for screening for malignant neoplasm of cervix: Secondary | ICD-10-CM | POA: Insufficient documentation

## 2011-11-25 ENCOUNTER — Other Ambulatory Visit: Payer: Self-pay | Admitting: Obstetrics & Gynecology

## 2011-11-25 DIAGNOSIS — Z139 Encounter for screening, unspecified: Secondary | ICD-10-CM

## 2011-12-31 ENCOUNTER — Ambulatory Visit (HOSPITAL_COMMUNITY)
Admission: RE | Admit: 2011-12-31 | Discharge: 2011-12-31 | Disposition: A | Discharge status: Home / Self Care | Payer: Medicare Other | Source: Ambulatory Visit | Attending: Obstetrics & Gynecology | Admitting: Obstetrics & Gynecology

## 2011-12-31 ENCOUNTER — Ambulatory Visit (HOSPITAL_COMMUNITY): Payer: Medicare Other

## 2011-12-31 DIAGNOSIS — Z139 Encounter for screening, unspecified: Secondary | ICD-10-CM

## 2011-12-31 DIAGNOSIS — Z1231 Encounter for screening mammogram for malignant neoplasm of breast: Secondary | ICD-10-CM | POA: Insufficient documentation

## 2011-12-31 IMAGING — MG MM DIGITAL SCREENING BILAT
7 series · 7 of 7 positions shown · non-contrast
Comparison: none

DG SCREEN MAMMOGRAM BILATERAL
Bilateral CC and MLO view(s) were taken.

DIGITAL SCREENING MAMMOGRAM WITH CAD:
There are scattered fibroglandular densities.  No masses or malignant type calcifications are 
identified.  Compared with prior studies.
Images were processed with CAD.

[L CC]
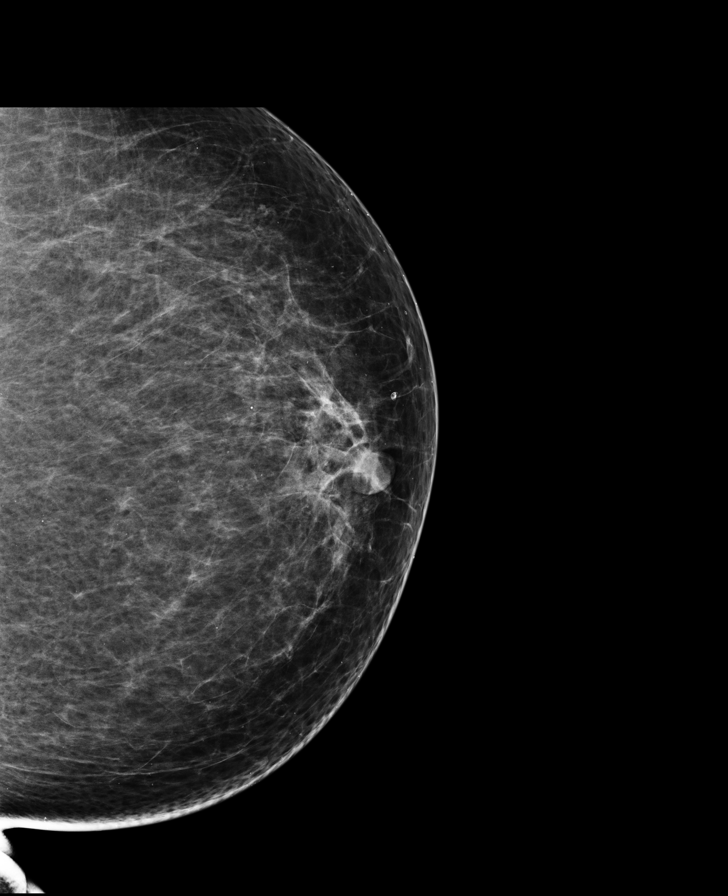

[L MLO]
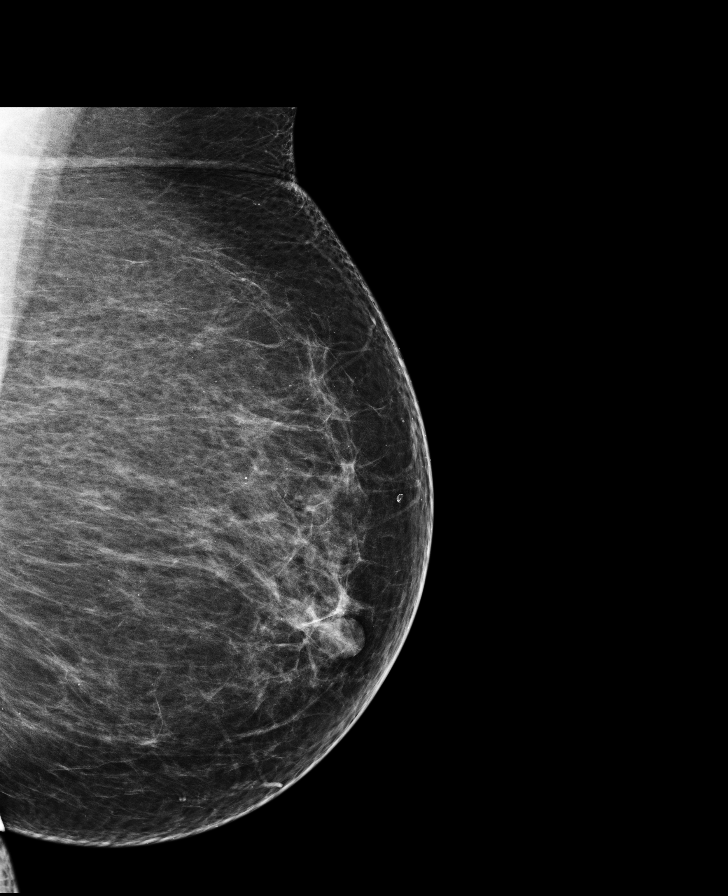

[R CC]
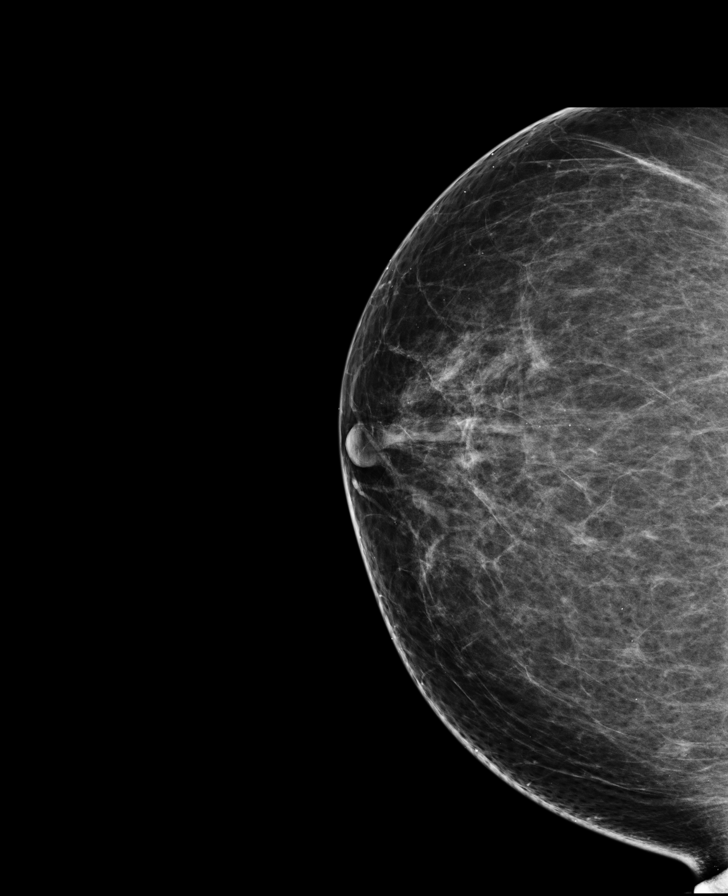

[R MLO (1 of 4)]
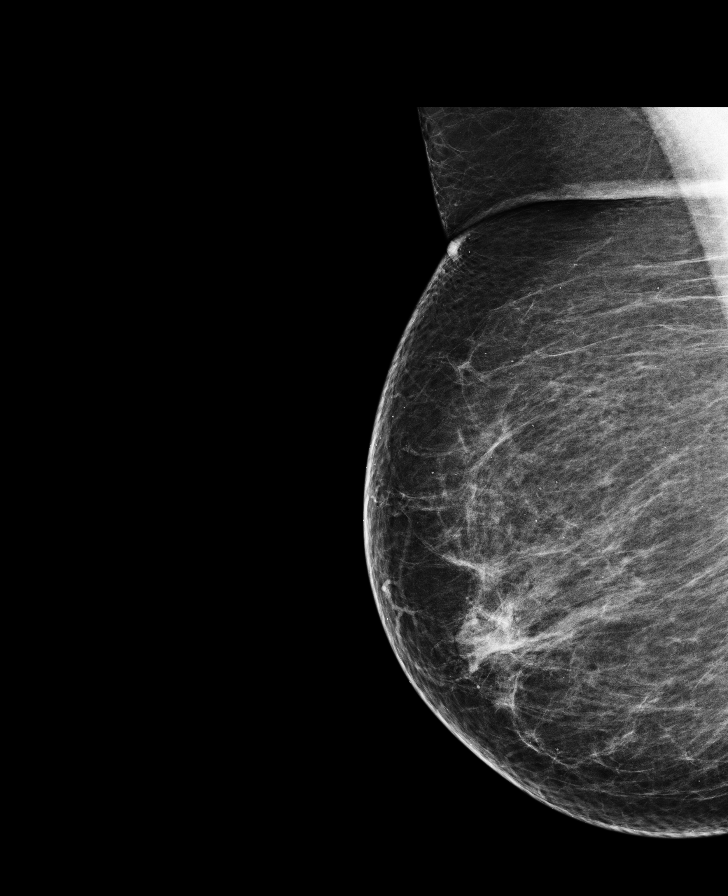

[R MLO (2 of 4)]
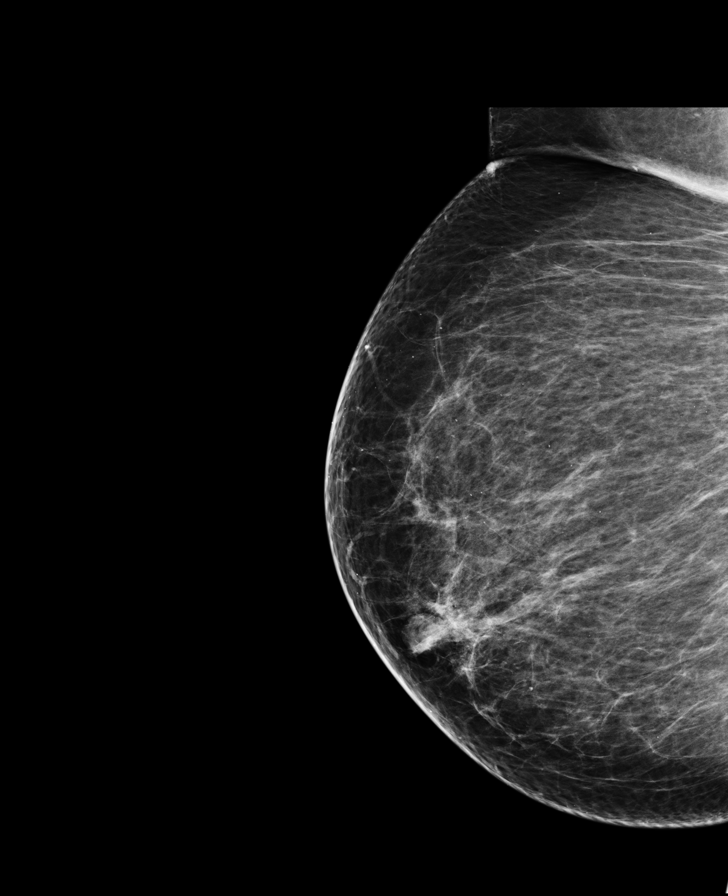

[R MLO (3 of 4)]
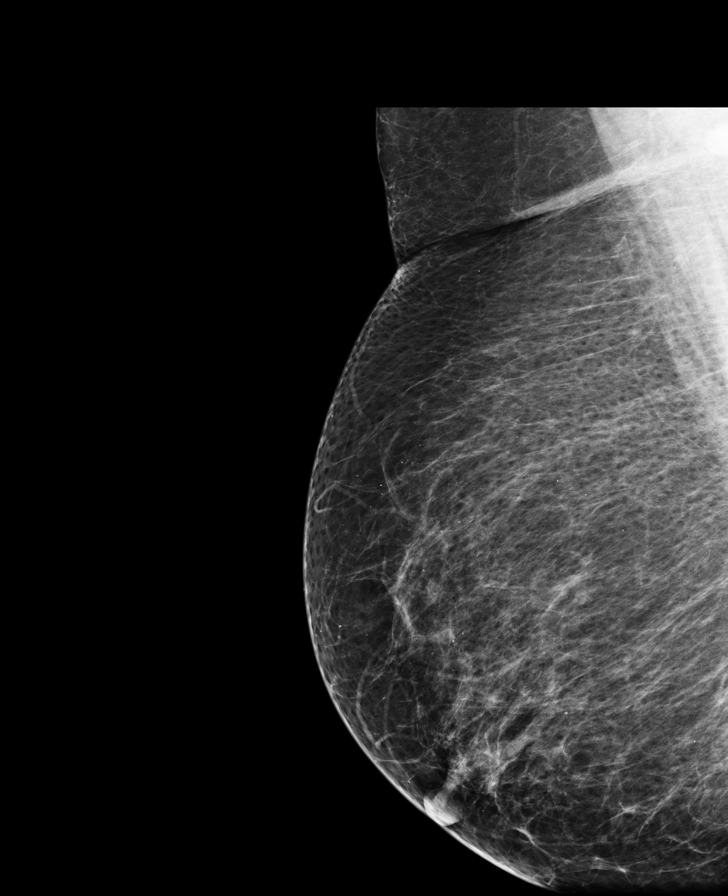

[R MLO (4 of 4)]
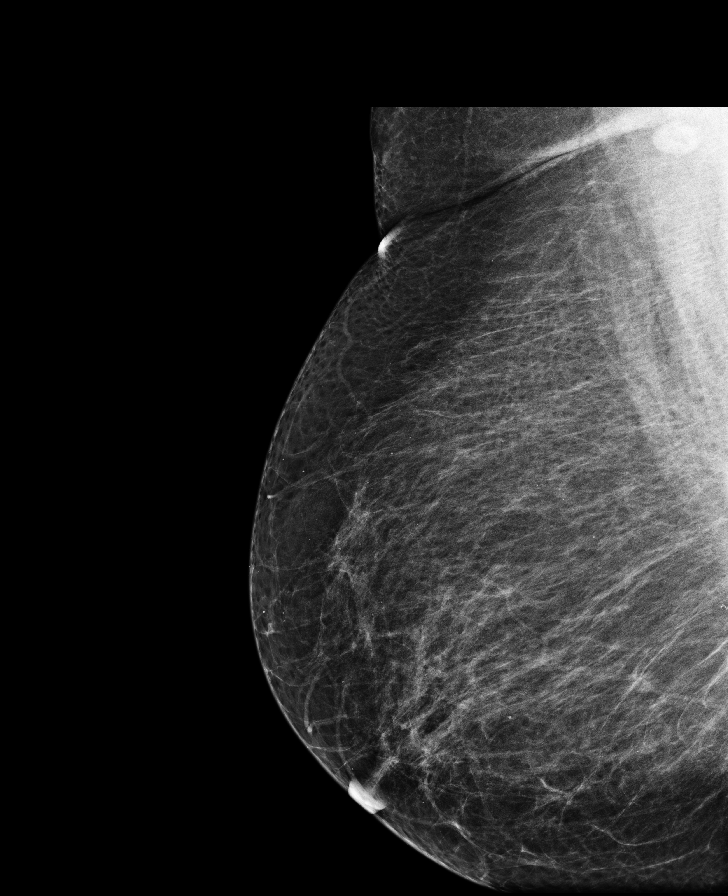

[7 of 7 positions shown; findings below may reference images not displayed]

IMPRESSION: No specific mammographic evidence of malignancy.  Next screening mammogram is recommended in one 
year.

A result letter of this screening mammogram will be mailed directly to the patient.

ASSESSMENT: Negative - BI-RADS 1

Screening mammogram in 1 year.
,

## 2012-12-01 ENCOUNTER — Other Ambulatory Visit: Payer: Self-pay | Admitting: Obstetrics & Gynecology

## 2012-12-01 DIAGNOSIS — Z139 Encounter for screening, unspecified: Secondary | ICD-10-CM

## 2013-01-02 ENCOUNTER — Ambulatory Visit (HOSPITAL_COMMUNITY)
Admission: RE | Admit: 2013-01-02 | Discharge: 2013-01-02 | Disposition: A | Discharge status: Home / Self Care | Payer: Medicare Other | Source: Ambulatory Visit | Attending: Obstetrics & Gynecology | Admitting: Obstetrics & Gynecology

## 2013-01-02 DIAGNOSIS — Z1231 Encounter for screening mammogram for malignant neoplasm of breast: Secondary | ICD-10-CM | POA: Insufficient documentation

## 2013-01-02 DIAGNOSIS — Z139 Encounter for screening, unspecified: Secondary | ICD-10-CM

## 2013-01-02 IMAGING — MG MM DIGITAL SCREENING BILAT
6 series · 6 of 6 positions shown · non-contrast
Comparison: Previous exams.

CLINICAL DATA: Screening.

DIGITAL BILATERAL SCREENING MAMMOGRAM WITH CAD

[L CC]
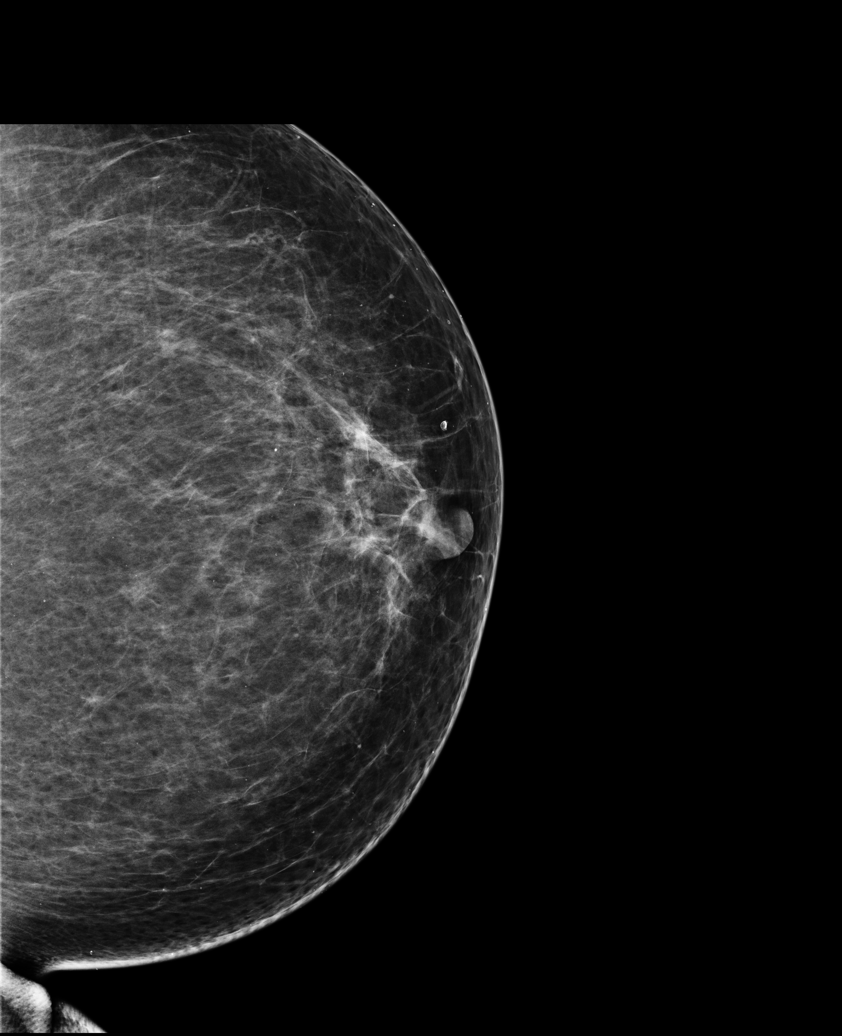

[L MLO (1 of 2)]
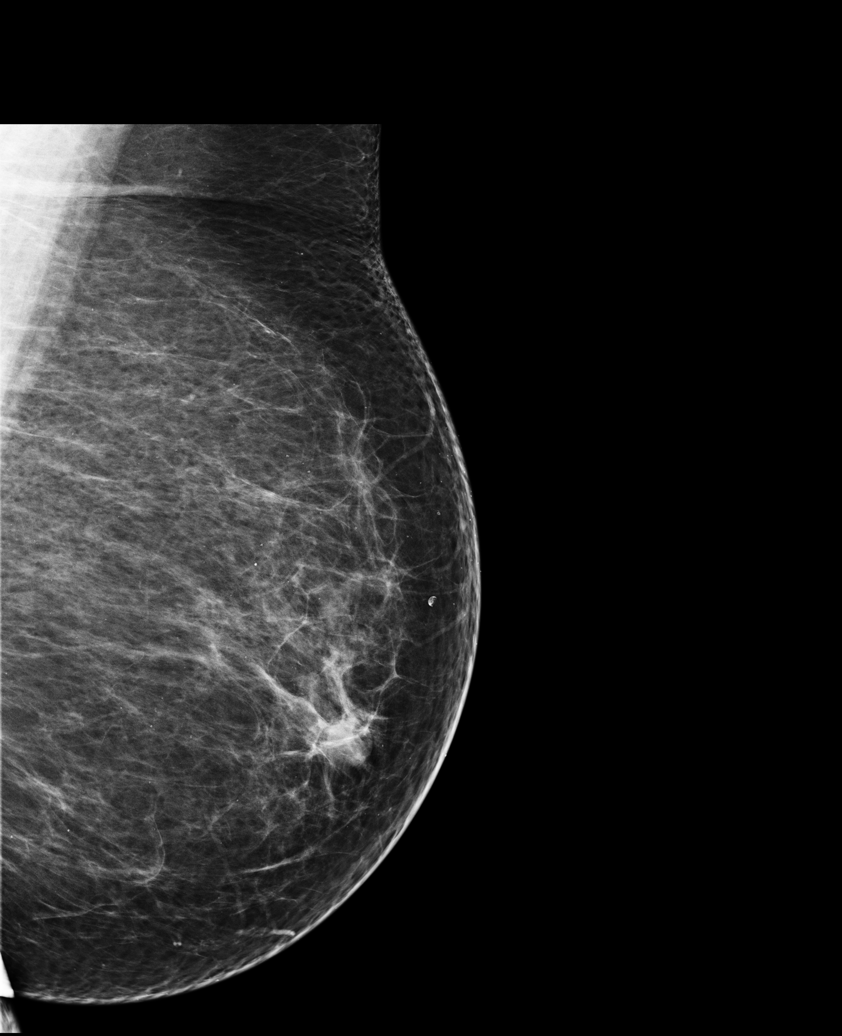

[R CC]
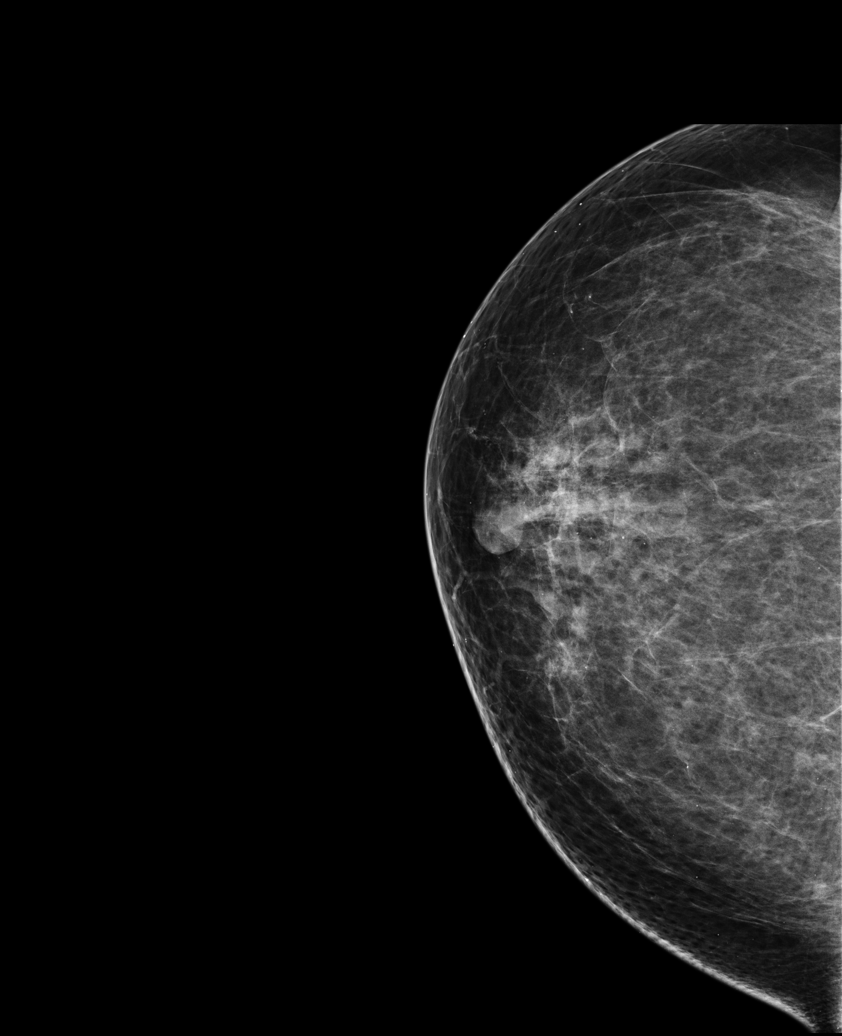

[R MLO (1 of 2)]
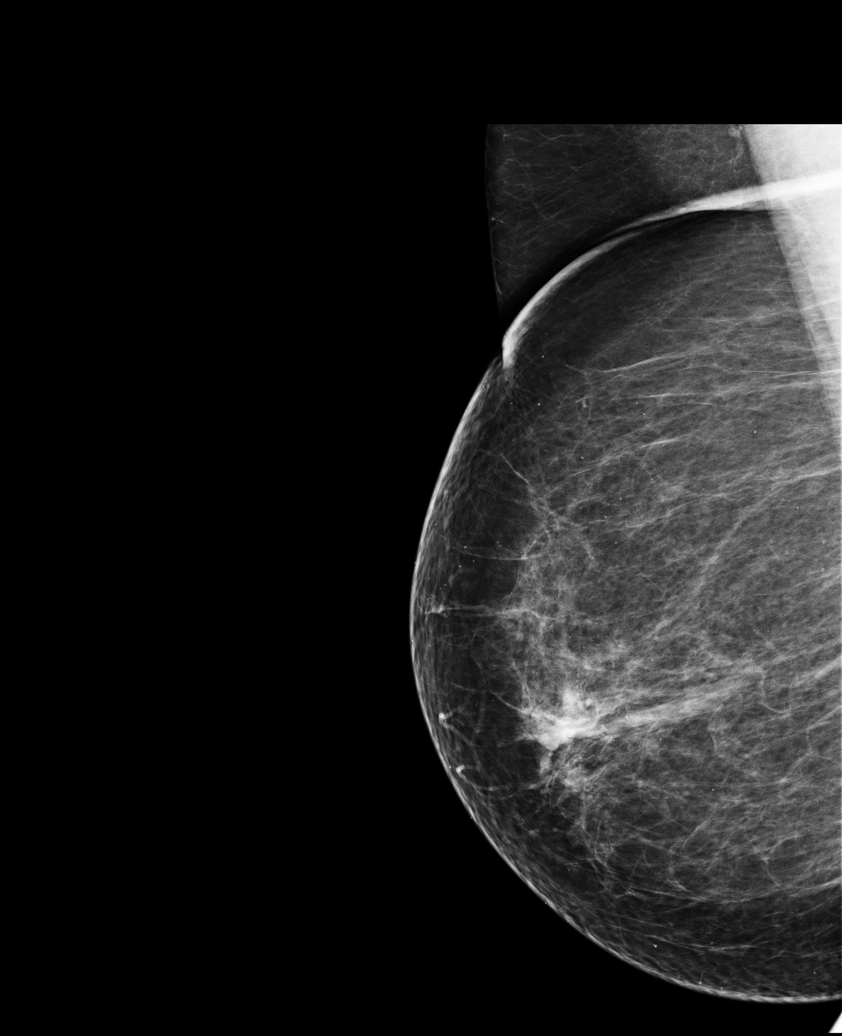

[L MLO (2 of 2)]
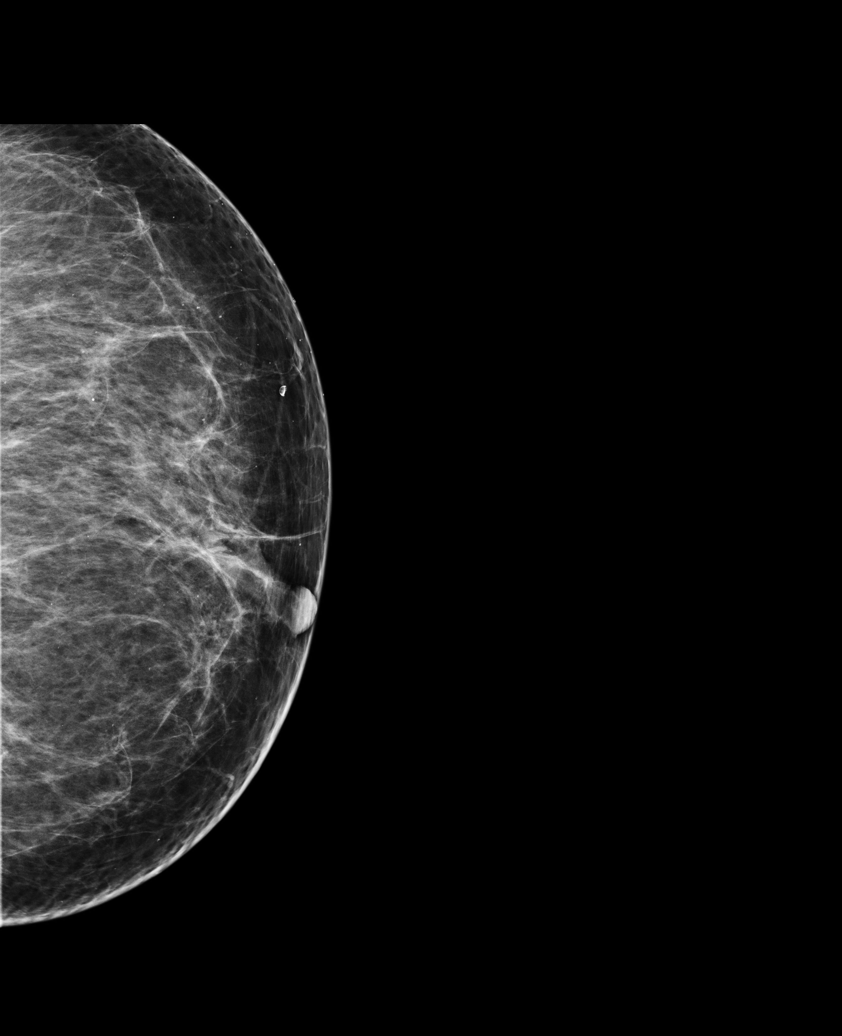

[R MLO (2 of 2)]
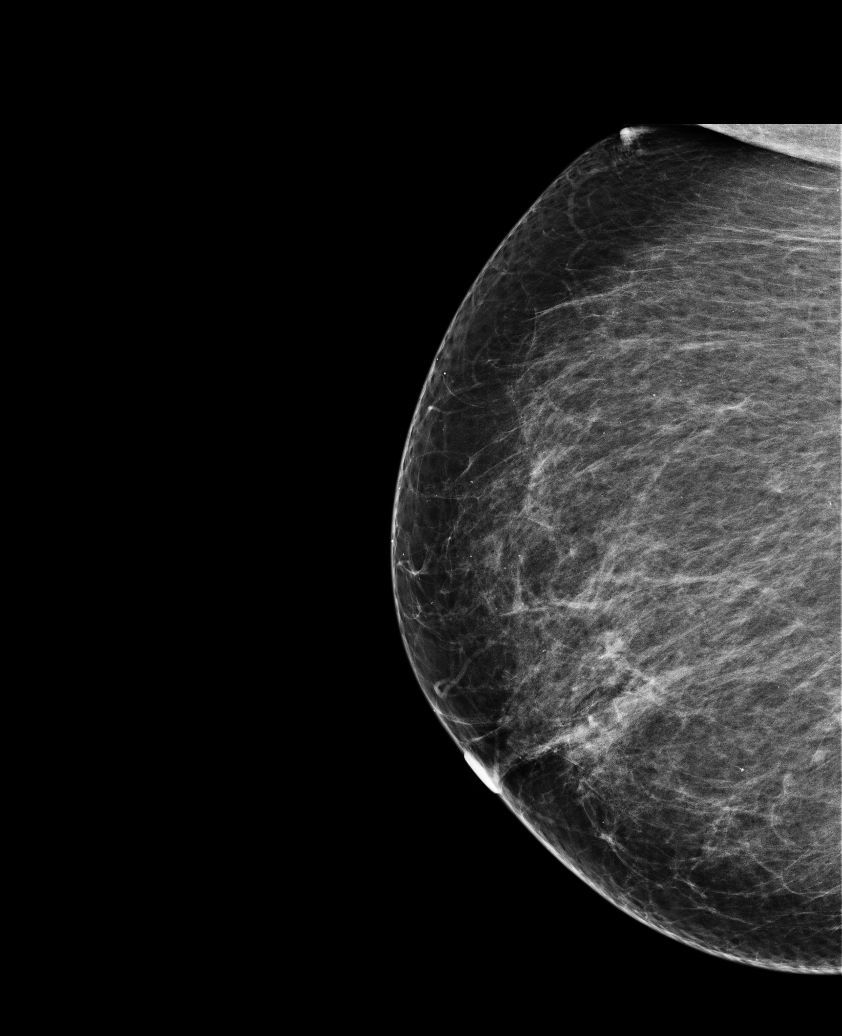

[6 of 6 positions shown; findings below may reference images not displayed]

FINDINGS: ACR Breast Density Category 2: There is a scattered fibroglandular
pattern.

No suspicious masses, architectural distortion, or calcifications
are present.

Images were processed with CAD.
IMPRESSION: No mammographic evidence of malignancy.

A result letter of this screening mammogram will be mailed directly
to the patient.

RECOMMENDATION:
Screening mammogram in one year. (Code:[FI])

BI-RADS CATEGORY 1:  Negative.

## 2013-06-11 ENCOUNTER — Other Ambulatory Visit: Payer: Self-pay | Admitting: *Deleted

## 2013-06-11 MED ORDER — MEGESTROL ACETATE 40 MG PO TABS: 40.0000 mg | ORAL_TABLET | Freq: Every day | ORAL | Status: DC

## 2013-10-23 ENCOUNTER — Other Ambulatory Visit (HOSPITAL_COMMUNITY)
Admission: RE | Admit: 2013-10-23 | Discharge: 2013-10-23 | Disposition: A | Discharge status: Home / Self Care | Payer: Medicare Other | Source: Ambulatory Visit | Attending: Obstetrics & Gynecology | Admitting: Obstetrics & Gynecology

## 2013-10-23 ENCOUNTER — Encounter: Payer: Self-pay | Admitting: Obstetrics & Gynecology

## 2013-10-23 ENCOUNTER — Ambulatory Visit (INDEPENDENT_AMBULATORY_CARE_PROVIDER_SITE_OTHER): Payer: PRIVATE HEALTH INSURANCE | Admitting: Obstetrics & Gynecology

## 2013-10-23 VITALS — BP 130/60 | Ht 63.0 in | Wt 263.0 lb

## 2013-10-23 DIAGNOSIS — Z01419 Encounter for gynecological examination (general) (routine) without abnormal findings: Secondary | ICD-10-CM

## 2013-10-23 DIAGNOSIS — Z124 Encounter for screening for malignant neoplasm of cervix: Secondary | ICD-10-CM | POA: Insufficient documentation

## 2013-10-23 DIAGNOSIS — Z1212 Encounter for screening for malignant neoplasm of rectum: Secondary | ICD-10-CM

## 2013-10-23 MED ORDER — MEDROXYPROGESTERONE ACETATE 10 MG PO TABS: 10.0000 mg | ORAL_TABLET | Freq: Every day | ORAL | Status: DC

## 2013-10-23 NOTE — Addendum Note (Signed)
Addended by: Richardson Chiquito on: 10/23/2013 10:41 AM   Modules accepted: Orders

## 2013-10-23 NOTE — Progress Notes (Signed)
Patient ID: Shelley Mitchell, female   DOB: Apr 18, 1941, 72 y.o.   MRN: [id number] Subjective:     NYAZIA Mitchell is a 72 y.o. female here for a routine exam.  No LMP recorded. Patient is postmenopausal. No obstetric history on file. Current complaints: none Does bleed/spot monthly after finishing her megace.  Increases her apetite will try to switch to provera.  She is on monthly progesterone to suppress her intrinsic stimulation of her endometrium   Gynecologic History No LMP recorded. Patient is postmenopausal. Contraception: post menopausal status Last Pap: 2013. Results were: normal Last mammogram: 2014. Results were: normal  Past Medical History  Diagnosis Date  . Hx of adenomatous colonic polyps 1999  . Osteoarthritis   . HTN (hypertension)   . Hyperlipidemia   . Obesity   . Endometrial polyp     Dr. Despina Hidden  . Tachycardia     subsequently with bradycardia (?secondary to meds)    Past Surgical History  Procedure Laterality Date  . Tubal ligation    . Cholecystectomy    . Colonoscopy  03/2002    Dr. Fleet Contras internal hemorrhoids  . 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 Colonoscopy  06/21/2011    Procedure: COLONOSCOPY;  Surgeon: Corbin Ade, MD;  Location: [facility];  Service: Endoscopy;  Laterality: N/A;    OB History   Grav Para Term Preterm Abortions TAB SAB Ect Mult Living                  History   Social History  . Marital Status: Married    Spouse Name: N/A    Number of Children: 4  . Years of Education: N/A   Occupational History  .     Social History Main Topics  . Smoking status: Never Smoker   . Smokeless tobacco: None  . Alcohol Use: No  . Drug Use: No  . Sexual Activity: None   Other Topics Concern  . None   Social History Narrative  . None    Family History  Problem Relation Age of Onset  . Diabetes Brother   . Diabetes Sister   . Cancer Father     ?primary  . Stroke Mother 49     Review of Systems  Review of  Systems  Constitutional: Negative for fever, chills, weight loss, malaise/fatigue and diaphoresis.  HENT: Negative for hearing loss, ear pain, nosebleeds, congestion, sore throat, neck pain, tinnitus and ear discharge.   Eyes: Negative for blurred vision, double vision, photophobia, pain, discharge and redness.  Respiratory: Negative for cough, hemoptysis, sputum production, shortness of breath, wheezing and stridor.   Cardiovascular: Negative for chest pain, palpitations, orthopnea, claudication, leg swelling and PND.  Gastrointestinal: negative for abdominal pain. Negative for heartburn, nausea, vomiting, diarrhea, constipation, blood in stool and melena.  Genitourinary: Negative for dysuria, urgency, frequency, hematuria and flank pain.  Musculoskeletal: Negative for myalgias, back pain, joint pain and falls.  Skin: Negative for itching and rash.  Neurological: Negative for dizziness, tingling, tremors, sensory change, speech change, focal weakness, seizures, loss of consciousness, weakness and headaches.  Endo/Heme/Allergies: Negative for environmental allergies and polydipsia. Does not bruise/bleed easily.  Psychiatric/Behavioral: Negative for depression, suicidal ideas, hallucinations, memory loss and substance abuse. The patient is not nervous/anxious and does not have insomnia.        Objective:    Physical Exam  Vitals reviewed. Constitutional: She is oriented to person, place, and time. She appears well-developed and well-nourished.  HENT:  Head: Normocephalic and atraumatic.        Right Ear: External ear normal.  Left Ear: External ear normal.  Nose: Nose normal.  Mouth/Throat: Oropharynx is clear and moist.  Eyes: Conjunctivae and EOM are normal. Pupils are equal, round, and reactive to light. Right eye exhibits no discharge. Left eye exhibits no discharge. No scleral icterus.  Neck: Normal range of motion. Neck supple. No tracheal deviation present. No thyromegaly present.   Cardiovascular: Normal rate, regular rhythm, normal heart sounds and intact distal pulses.  Exam reveals no gallop and no friction rub.   No murmur heard. Respiratory: Effort normal and breath sounds normal. No respiratory distress. She has no wheezes. She has no rales. She exhibits no tenderness.  GI: Soft. Bowel sounds are normal. She exhibits no distension and no mass. There is no tenderness. There is no rebound and no guarding.  Genitourinary:  Breasts no masses skin changes or nipple changes bilaterally      Vulva is normal without lesions Vagina is pink moist without discharge Cervix normal in appearance and pap is done Uterus is normal size shape and contour Adnexa is negative with normal sized ovaries  Rectal    hemoccult negative, normal tone, no masses  Musculoskeletal: Normal range of motion. She exhibits no edema and no tenderness.  Neurological: She is alert and oriented to person, place, and time. She has normal reflexes. She displays normal reflexes. No cranial nerve deficit. She exhibits normal muscle tone. Coordination normal.  Skin: Skin is warm and dry. No rash noted. No erythema. No pallor.  Psychiatric: She has a normal mood and affect. Her behavior is normal. Judgment and thought content normal.       Assessment:    Healthy female exam.    Plan:    Follow up in: 1 year. Provera 10 mg x 10 days  month to suppress endometrial development

## 2013-12-20 ENCOUNTER — Other Ambulatory Visit (HOSPITAL_COMMUNITY): Payer: Self-pay | Admitting: Family Medicine

## 2013-12-20 DIAGNOSIS — Z139 Encounter for screening, unspecified: Secondary | ICD-10-CM

## 2014-01-03 ENCOUNTER — Ambulatory Visit (HOSPITAL_COMMUNITY)
Admission: RE | Admit: 2014-01-03 | Discharge: 2014-01-03 | Disposition: A | Discharge status: Home / Self Care | Payer: Medicare Other | Source: Ambulatory Visit | Attending: Family Medicine | Admitting: Family Medicine

## 2014-01-03 DIAGNOSIS — Z139 Encounter for screening, unspecified: Secondary | ICD-10-CM

## 2014-01-03 DIAGNOSIS — Z1231 Encounter for screening mammogram for malignant neoplasm of breast: Secondary | ICD-10-CM | POA: Insufficient documentation

## 2014-10-29 ENCOUNTER — Other Ambulatory Visit: Payer: Self-pay | Admitting: *Deleted

## 2014-10-30 MED ORDER — MEDROXYPROGESTERONE ACETATE 10 MG PO TABS: 10.0000 mg | ORAL_TABLET | Freq: Every day | ORAL | Status: DC

## 2014-12-10 ENCOUNTER — Other Ambulatory Visit: Payer: Self-pay | Admitting: Obstetrics & Gynecology

## 2014-12-10 DIAGNOSIS — Z1231 Encounter for screening mammogram for malignant neoplasm of breast: Secondary | ICD-10-CM

## 2015-01-06 ENCOUNTER — Ambulatory Visit (HOSPITAL_COMMUNITY)
Admission: RE | Admit: 2015-01-06 | Discharge: 2015-01-06 | Disposition: A | Discharge status: Home / Self Care | Payer: Medicare PPO | Source: Ambulatory Visit | Attending: Obstetrics & Gynecology | Admitting: Obstetrics & Gynecology

## 2015-01-06 DIAGNOSIS — Z1231 Encounter for screening mammogram for malignant neoplasm of breast: Secondary | ICD-10-CM | POA: Diagnosis not present

## 2015-11-06 ENCOUNTER — Other Ambulatory Visit: Payer: Self-pay | Admitting: Obstetrics & Gynecology

## 2015-11-21 ENCOUNTER — Other Ambulatory Visit (HOSPITAL_COMMUNITY)
Admission: RE | Admit: 2015-11-21 | Discharge: 2015-11-21 | Disposition: A | Discharge status: Home / Self Care | Payer: Medicare PPO | Source: Ambulatory Visit | Attending: Obstetrics & Gynecology | Admitting: Obstetrics & Gynecology

## 2015-11-21 ENCOUNTER — Ambulatory Visit (INDEPENDENT_AMBULATORY_CARE_PROVIDER_SITE_OTHER): Payer: Medicare PPO | Admitting: Obstetrics & Gynecology

## 2015-11-21 VITALS — BP 140/80 | HR 62 | Ht 64.0 in | Wt 246.0 lb

## 2015-11-21 DIAGNOSIS — Z124 Encounter for screening for malignant neoplasm of cervix: Secondary | ICD-10-CM | POA: Insufficient documentation

## 2015-11-21 DIAGNOSIS — Z01419 Encounter for gynecological examination (general) (routine) without abnormal findings: Secondary | ICD-10-CM | POA: Diagnosis not present

## 2015-11-21 MED ORDER — MEDROXYPROGESTERONE ACETATE 10 MG PO TABS: 10.0000 mg | ORAL_TABLET | Freq: Every day | ORAL | Status: DC

## 2015-11-21 NOTE — Progress Notes (Signed)
Patient ID: Shelley Mitchell, female   DOB: [birth year], 74 y.o.   MRN: [id number] Subjective:     Shelley Mitchell is a 74 y.o. female here for a routine exam.  No LMP recorded. Patient is postmenopausal. No obstetric history on file. Birth Control Method:  pm Menstrual Calendar(currently): occasional  Current complaints: none.   Current acute medical issues:  Take cyclical provera x 10 days monthly  Due to her history of complex endoemtrial hyperplasia from years ago, she has rare bleeding   Recent Gynecologic History No LMP recorded. Patient is postmenopausal. Last Pap: 2015,  normal Last mammogram: 2016,  normal  Past Medical History  Diagnosis Date  . Hx of adenomatous colonic polyps 1999  . Osteoarthritis   . HTN (hypertension)   . Hyperlipidemia   . Obesity   . Endometrial polyp     Dr. Elonda Husky  . Tachycardia     subsequently with bradycardia (?secondary to meds)    Past Surgical History  Procedure Laterality Date  . Tubal ligation    . Cholecystectomy    . Colonoscopy  03/2002    Dr. Jamesetta Geralds internal hemorrhoids  . 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 Colonoscopy  06/21/2011    Procedure: COLONOSCOPY;  Surgeon: Daneil Dolin, MD;  Location: [facility];  Service: Endoscopy;  Laterality: N/A;    OB History    No data available      Social History   Social History  . Marital Status: Married    Spouse Name: N/A  . Number of Children: 4  . Years of Education: N/A   Occupational History  .     Social History Main Topics  . Smoking status: Never Smoker   . Smokeless tobacco: Not on file  . Alcohol Use: No  . Drug Use: No  . Sexual Activity: Not on file   Other Topics Concern  . Not on file   Social History Narrative  . No narrative on file    Family History  Problem Relation Age of Onset  . Diabetes Brother   . Diabetes Sister   . Cancer Father     ?primary  . Stroke Mother 73     Current outpatient prescriptions:  .  aspirin 81 MG EC  tablet, Take 81 mg by mouth daily.  , Disp: , Rfl:  .  CELEBREX 200 MG capsule, , Disp: , Rfl:  .  hydrALAZINE (APRESOLINE) 100 MG tablet, , Disp: , Rfl:  .  hydrochlorothiazide 50 MG tablet, , Disp: , Rfl:  .  losartan (COZAAR) 100 MG tablet, , Disp: , Rfl:  .  medroxyPROGESTERone (PROVERA) 10 MG tablet, Take 1 tablet (10 mg total) by mouth daily., Disp: 30 tablet, Rfl: 11 .  NIFEdipine (PROCARDIA XL/ADALAT-CC) 90 MG 24 hr tablet, , Disp: , Rfl:  .  pravastatin (PRAVACHOL) 40 MG tablet, , Disp: , Rfl:   Review of Systems  Review of Systems  Constitutional: Negative for fever, chills, weight loss, malaise/fatigue and diaphoresis.  HENT: Negative for hearing loss, ear pain, nosebleeds, congestion, sore throat, neck pain, tinnitus and ear discharge.   Eyes: Negative for blurred vision, double vision, photophobia, pain, discharge and redness.  Respiratory: Negative for cough, hemoptysis, sputum production, shortness of breath, wheezing and stridor.   Cardiovascular: Negative for chest pain, palpitations, orthopnea, claudication, leg swelling and PND.  Gastrointestinal: negative for abdominal pain. Negative for heartburn, nausea, vomiting, diarrhea, constipation, blood in stool and melena.  Genitourinary: Negative for  dysuria, urgency, frequency, hematuria and flank pain.  Musculoskeletal: Negative for myalgias, back pain, joint pain and falls.  Skin: Negative for itching and rash.  Neurological: Negative for dizziness, tingling, tremors, sensory change, speech change, focal weakness, seizures, loss of consciousness, weakness and headaches.  Endo/Heme/Allergies: Negative for environmental allergies and polydipsia. Does not bruise/bleed easily.  Psychiatric/Behavioral: Negative for depression, suicidal ideas, hallucinations, memory loss and substance abuse. The patient is not nervous/anxious and does not have insomnia.        Objective:  Blood pressure 140/80, pulse 62, height 5\' 4"  (1.626 m),  weight 246 lb (111.585 kg).   Physical Exam  Vitals reviewed. Constitutional: She is oriented to person, place, and time. She appears well-developed and well-nourished.  HENT:  Head: Normocephalic and atraumatic.        Right Ear: External ear normal.  Left Ear: External ear normal.  Nose: Nose normal.  Mouth/Throat: Oropharynx is clear and moist.  Eyes: Conjunctivae and EOM are normal. Pupils are equal, round, and reactive to light. Right eye exhibits no discharge. Left eye exhibits no discharge. No scleral icterus.  Neck: Normal range of motion. Neck supple. No tracheal deviation present. No thyromegaly present.  Cardiovascular: Normal rate, regular rhythm, normal heart sounds and intact distal pulses.  Exam reveals no gallop and no friction rub.   No murmur heard. Respiratory: Effort normal and breath sounds normal. No respiratory distress. She has no wheezes. She has no rales. She exhibits no tenderness.  GI: Soft. Bowel sounds are normal. She exhibits no distension and no mass. There is no tenderness. There is no rebound and no guarding.  Genitourinary:  Breasts no masses skin changes or nipple changes bilaterally      Vulva is normal without lesions Vagina is pink moist without discharge Cervix normal in appearance and pap is done Uterus is normal size shape and contour Adnexa is negative with normal sized ovaries   Musculoskeletal: Normal range of motion. She exhibits no edema and no tenderness.  Neurological: She is alert and oriented to person, place, and time. She has normal reflexes. She displays normal reflexes. No cranial nerve deficit. She exhibits normal muscle tone. Coordination normal.  Skin: Skin is warm and dry. No rash noted. No erythema. No pallor.  Psychiatric: She has a normal mood and affect. Her behavior is normal. Judgment and thought content normal.       Assessment:    Healthy female exam.    Plan:    Follow up in: 1 year.    Provera 10 mg x 10 days  monthly

## 2015-11-25 LAB — CYTOLOGY - PAP

## 2015-12-05 ENCOUNTER — Other Ambulatory Visit: Payer: Self-pay | Admitting: Obstetrics & Gynecology

## 2015-12-05 DIAGNOSIS — Z1231 Encounter for screening mammogram for malignant neoplasm of breast: Secondary | ICD-10-CM

## 2016-01-09 ENCOUNTER — Ambulatory Visit (HOSPITAL_COMMUNITY)
Admission: RE | Admit: 2016-01-09 | Discharge: 2016-01-09 | Disposition: A | Discharge status: Home / Self Care | Payer: Medicare Other | Source: Ambulatory Visit | Attending: Obstetrics & Gynecology | Admitting: Obstetrics & Gynecology

## 2016-01-09 DIAGNOSIS — Z1231 Encounter for screening mammogram for malignant neoplasm of breast: Secondary | ICD-10-CM | POA: Insufficient documentation

## 2016-05-13 ENCOUNTER — Encounter: Payer: Self-pay | Admitting: Internal Medicine

## 2016-06-23 ENCOUNTER — Other Ambulatory Visit: Payer: Self-pay

## 2016-06-23 ENCOUNTER — Ambulatory Visit (INDEPENDENT_AMBULATORY_CARE_PROVIDER_SITE_OTHER): Payer: Medicare Other | Admitting: Nurse Practitioner

## 2016-06-23 ENCOUNTER — Encounter: Payer: Self-pay | Admitting: Nurse Practitioner

## 2016-06-23 VITALS — BP 128/67 | HR 48 | Temp 98.5°F | Ht 63.0 in | Wt 250.0 lb

## 2016-06-23 DIAGNOSIS — Z86010 Personal history of colonic polyps: Secondary | ICD-10-CM | POA: Diagnosis not present

## 2016-06-23 MED ORDER — PEG-KCL-NACL-NASULF-NA ASC-C 100 G PO SOLR: 1.0000 | ORAL | Status: DC

## 2016-06-23 NOTE — Progress Notes (Signed)
Primary Care Physician:  Petra Kuba, MD Primary Gastroenterologist:  Dr. Gala Romney  Chief Complaint  Patient presents with  . Colonoscopy    5 year recall    HPI:   Shelley Mitchell is a 75 y.o. female who presents to schedule 5 year repeat colonoscopy. Last colonoscopy performed 06/21/2011 for history of colonic adenomas, which found ileocecal valve polyp status post removal, anal canal hemorrhoids and papilla. Surgical pathology found the polyp to be tubular adenoma. Recommended repeat colonoscopy in 5 years.  Today she states she's doing well. Denies abdominal pain, N/V, fever, chills, unintentional weight loss, acute change in bowel habits. Denies chest pain, dyspnea, dizziness, lightheadedness, syncope, near syncope. Denies any other upper or lower GI symptoms.  Past Medical History  Diagnosis Date  . Hx of adenomatous colonic polyps 1999  . Osteoarthritis   . HTN (hypertension)   . Hyperlipidemia   . Obesity   . Endometrial polyp     Dr. Elonda Husky  . Tachycardia     subsequently with bradycardia (?secondary to meds)    Past Surgical History  Procedure Laterality Date  . Tubal ligation    . Cholecystectomy    . Colonoscopy  03/2002    Dr. Jamesetta Geralds internal hemorrhoids  . 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 Colonoscopy  06/21/2011    Procedure: COLONOSCOPY;  Surgeon: Daneil Dolin, MD;  Location: [facility];  Service: Endoscopy;  Laterality: N/A;    Current Outpatient Prescriptions  Medication Sig Dispense Refill  . aspirin 81 MG EC tablet Take 81 mg by mouth daily.      . CELEBREX 200 MG capsule     . hydrALAZINE (APRESOLINE) 100 MG tablet     . hydrochlorothiazide 50 MG tablet     . losartan (COZAAR) 100 MG tablet     . medroxyPROGESTERone (PROVERA) 10 MG tablet Take 1 tablet (10 mg total) by mouth daily. 30 tablet 11  . NIFEdipine (PROCARDIA XL/ADALAT-CC) 90 MG 24 hr tablet     . pravastatin (PRAVACHOL) 40 MG tablet      No current  facility-administered medications for this visit.    Allergies as of 06/23/2016  . (No Known Allergies)    Family History  Problem Relation Age of Onset  . Diabetes Brother   . Diabetes Sister   . Cancer Father     Likely prostate  . Stroke Mother 50  . Colon cancer Neg Hx     Social History   Social History  . Marital Status: Married    Spouse Name: N/A  . Number of Children: 4  . Years of Education: N/A   Occupational History  .     Social History Main Topics  . Smoking status: Never Smoker   . Smokeless tobacco: Never Used  . Alcohol Use: No  . Drug Use: No  . Sexual Activity: Not on file   Other Topics Concern  . Not on file   Social History Narrative    Review of Systems: General: Negative for anorexia, weight loss, fever, chills, fatigue, weakness. ENT: Negative for hoarseness, difficulty swallowing. CV: Negative for chest pain, angina, palpitations, peripheral edema.  Respiratory: Negative for dyspnea at rest, cough, sputum, wheezing.  GI: See history of present illness. MS: Chronic joint pain.  Derm: Negative for rash or itching.  Endo: Negative for unusual weight change.  Heme: Negative for bruising or bleeding. Allergy: Negative for rash or hives.    Physical Exam: BP 128/67 mmHg  Pulse 48  Temp(Src) 98.5 F (36.9 C) (Oral)  Ht 5\' 3"  (1.6 m)  Wt 250 lb (113.399 kg)  BMI 44.30 kg/m2 General:   Alert and oriented. Pleasant and cooperative. Well-nourished and well-developed.  Head:  Normocephalic and atraumatic. Eyes:  Without icterus, sclera clear and conjunctiva pink.  Ears:  Normal auditory acuity. Cardiovascular:  S1, S2 present without murmurs appreciated. Extremities without clubbing or edema. Respiratory:  Clear to auscultation bilaterally. No wheezes, rales, or rhonchi. No distress.  Gastrointestinal:  +BS, obese but soft, non-tender and non-distended. No HSM noted. No guarding or rebound. No masses appreciated.  Rectal:  Deferred    Neurologic:  Alert and oriented x4;  grossly normal neurologically. Psych:  Alert and cooperative. Normal mood and affect. Heme/Lymph/Immune: No excessive bruising noted.    06/23/2016 11:34 AM   Disclaimer: This note was dictated with voice recognition software. Similar sounding words can inadvertently be transcribed and may not be corrected upon review.

## 2016-06-23 NOTE — Patient Instructions (Signed)
1. We will schedule your procedure for you. 2. Return for follow-up based on the recommendations made after your procedure.

## 2016-06-23 NOTE — Progress Notes (Signed)
cc'ed to pcp °

## 2016-06-23 NOTE — Assessment & Plan Note (Signed)
History of adenomatous colon polyps. Last colonoscopy in 2012 with a single polyp found to be tubular adenoma. She returns today to schedule recommended 5 year repeat colonoscopy. She is generally asymptomatic from a GI standpoint. We will proceed with recommended colonoscopy.  Proceed with TCS with Dr. Gala Romney in near future: the risks, benefits, and alternatives have been discussed with the patient in detail. The patient states understanding and desires to proceed.  The patient is not on any anticoagulants, anxiolytics, chronic pain medications, or antidepressants. Conscious sedation should be adequate for her procedure.

## 2016-07-12 ENCOUNTER — Telehealth: Payer: Self-pay | Admitting: Internal Medicine

## 2016-07-12 ENCOUNTER — Other Ambulatory Visit: Payer: Self-pay

## 2016-07-12 MED ORDER — PEG 3350-KCL-NA BICARB-NACL 420 G PO SOLR: 4000.0000 mL | ORAL | 0 refills | Status: DC

## 2016-07-12 NOTE — Telephone Encounter (Signed)
Pt is scheduled for tcs on 8/10 and her prep is too expensive and can we call something else in for her. She uses [facility] in [place]

## 2016-07-12 NOTE — Telephone Encounter (Signed)
NEW PREP CALLED IN AN NEW INSTRUCTIONS FAXED TO PHARMACY AND SHE IS AWARE

## 2016-07-15 ENCOUNTER — Encounter (HOSPITAL_COMMUNITY): Payer: Self-pay | Admitting: *Deleted

## 2016-07-15 ENCOUNTER — Ambulatory Visit (HOSPITAL_COMMUNITY)
Admission: RE | Admit: 2016-07-15 | Discharge: 2016-07-15 | Disposition: A | Discharge status: Home / Self Care | Payer: Medicare Other | Source: Ambulatory Visit | Attending: Internal Medicine | Admitting: Internal Medicine

## 2016-07-15 ENCOUNTER — Encounter (HOSPITAL_COMMUNITY)
Admission: RE | Disposition: A | Discharge status: Home / Self Care | Payer: Self-pay | Source: Ambulatory Visit | Attending: Internal Medicine

## 2016-07-15 DIAGNOSIS — E669 Obesity, unspecified: Secondary | ICD-10-CM | POA: Insufficient documentation

## 2016-07-15 DIAGNOSIS — Z79899 Other long term (current) drug therapy: Secondary | ICD-10-CM | POA: Insufficient documentation

## 2016-07-15 DIAGNOSIS — I10 Essential (primary) hypertension: Secondary | ICD-10-CM | POA: Diagnosis not present

## 2016-07-15 DIAGNOSIS — Z7982 Long term (current) use of aspirin: Secondary | ICD-10-CM | POA: Diagnosis not present

## 2016-07-15 DIAGNOSIS — D123 Benign neoplasm of transverse colon: Secondary | ICD-10-CM | POA: Insufficient documentation

## 2016-07-15 DIAGNOSIS — E785 Hyperlipidemia, unspecified: Secondary | ICD-10-CM | POA: Diagnosis not present

## 2016-07-15 DIAGNOSIS — Z86010 Personal history of colonic polyps: Secondary | ICD-10-CM | POA: Insufficient documentation

## 2016-07-15 DIAGNOSIS — M1990 Unspecified osteoarthritis, unspecified site: Secondary | ICD-10-CM | POA: Diagnosis not present

## 2016-07-15 DIAGNOSIS — Z96653 Presence of artificial knee joint, bilateral: Secondary | ICD-10-CM | POA: Diagnosis not present

## 2016-07-15 DIAGNOSIS — Z1211 Encounter for screening for malignant neoplasm of colon: Secondary | ICD-10-CM | POA: Diagnosis present

## 2016-07-15 HISTORY — PX: COLONOSCOPY: SHX5424

## 2016-07-15 HISTORY — PX: POLYPECTOMY: SHX5525

## 2016-07-15 SURGERY — COLONOSCOPY
Anesthesia: Moderate Sedation

## 2016-07-15 MED ORDER — MEPERIDINE HCL 100 MG/ML IJ SOLN
INTRAMUSCULAR | Status: AC
  Filled 2016-07-15: qty 2

## 2016-07-15 MED ORDER — MIDAZOLAM HCL 5 MG/5ML IJ SOLN
INTRAMUSCULAR | Status: AC
  Filled 2016-07-15: qty 10

## 2016-07-15 MED ORDER — ONDANSETRON HCL 4 MG/2ML IJ SOLN
INTRAMUSCULAR | Status: AC
  Filled 2016-07-15: qty 2

## 2016-07-15 MED ORDER — MIDAZOLAM HCL 5 MG/5ML IJ SOLN
INTRAMUSCULAR | Status: DC | PRN
  Administered 2016-07-15: 2 mg via INTRAVENOUS

## 2016-07-15 MED ORDER — MEPERIDINE HCL 100 MG/ML IJ SOLN
INTRAMUSCULAR | Status: DC | PRN
  Administered 2016-07-15: 25 mg via INTRAVENOUS

## 2016-07-15 MED ORDER — SODIUM CHLORIDE 0.9 % IV SOLN
INTRAVENOUS | Status: DC
Start: 2016-07-15 — End: 2016-07-15
  Administered 2016-07-15: 1000 mL via INTRAVENOUS

## 2016-07-15 MED ORDER — ONDANSETRON HCL 4 MG/2ML IJ SOLN
INTRAMUSCULAR | Status: DC | PRN
  Administered 2016-07-15: 4 mg via INTRAVENOUS

## 2016-07-15 MED ORDER — STERILE WATER FOR IRRIGATION IR SOLN
Status: DC | PRN
  Administered 2016-07-15: 13:00:00

## 2016-07-15 NOTE — Discharge Instructions (Addendum)
°Colonoscopy °Discharge Instructions ° °Read the instructions outlined below and refer to this sheet in the next few weeks. These discharge instructions provide you with general information on caring for yourself after you leave the hospital. Your doctor may also give you specific instructions. While your treatment has been planned according to the most current medical practices available, unavoidable complications occasionally occur. If you have any problems or questions after discharge, call Dr. Rourk at [phone]. °ACTIVITY °· You may resume your regular activity, but move at a slower pace for the next 24 hours.  °· Take frequent rest periods for the next 24 hours.  °· Walking will help get rid of the air and reduce the bloated feeling in your belly (abdomen).  °· No driving for 24 hours (because of the medicine (anesthesia) used during the test).   °· Do not sign any important legal documents or operate any machinery for 24 hours (because of the anesthesia used during the test).  °NUTRITION °· Drink plenty of fluids.  °· You may resume your normal diet as instructed by your doctor.  °· Begin with a light meal and progress to your normal diet. Heavy or fried foods are harder to digest and may make you feel sick to your stomach (nauseated).  °· Avoid alcoholic beverages for 24 hours or as instructed.  °MEDICATIONS °· You may resume your normal medications unless your doctor tells you otherwise.  °WHAT YOU CAN EXPECT TODAY °· Some feelings of bloating in the abdomen.  °· Passage of more gas than usual.  °· Spotting of blood in your stool or on the toilet paper.  °IF YOU HAD POLYPS REMOVED DURING THE COLONOSCOPY: °· No aspirin products for 7 days or as instructed.  °· No alcohol for 7 days or as instructed.  °· Eat a soft diet for the next 24 hours.  °FINDING OUT THE RESULTS OF YOUR TEST °Not all test results are available during your visit. If your test results are not back during the visit, make an appointment  with your caregiver to find out the results. Do not assume everything is normal if you have not heard from your caregiver or the medical facility. It is important for you to follow up on all of your test results.  °SEEK IMMEDIATE MEDICAL ATTENTION IF: °· You have more than a spotting of blood in your stool.  °· Your belly is swollen (abdominal distention).  °· You are nauseated or vomiting.  °· You have a temperature over 101.  °· You have abdominal pain or discomfort that is severe or gets worse throughout the day.  ° ° °Colon polyp information provided °Further recommendations to follow pending review of pathology report ° ° °Colon Polyps °Polyps are lumps of extra tissue growing inside the body. Polyps can grow in the large intestine (colon). Most colon polyps are noncancerous (benign). However, some colon polyps can become cancerous over time. Polyps that are larger than a pea may be harmful. To be safe, caregivers remove and test all polyps. °CAUSES  °Polyps form when mutations in the genes cause your cells to grow and divide even though no more tissue is needed. °RISK FACTORS °There are a number of risk factors that can increase your chances of getting colon polyps. They include: °· Being older than 50 years. °· Family history of colon polyps or colon cancer. °· Long-term colon diseases, such as colitis or Crohn disease. °· Being overweight. °· Smoking. °· Being inactive. °· Drinking too much alcohol. °  SYMPTOMS  °Most small polyps do not cause symptoms. If symptoms are present, they may include: °· Blood in the stool. The stool may look dark red or black. °· Constipation or diarrhea that lasts longer than 1 week. °DIAGNOSIS °People often do not know they have polyps until their caregiver finds them during a regular checkup. Your caregiver can use 4 tests to check for polyps: °· Digital rectal exam. The caregiver wears gloves and feels inside the rectum. This test would find polyps only in the rectum. °· Barium  enema. The caregiver puts a liquid called barium into your rectum before taking X-rays of your colon. Barium makes your colon look white. Polyps are dark, so they are easy to see in the X-ray pictures. °· Sigmoidoscopy. A thin, flexible tube (sigmoidoscope) is placed into your rectum. The sigmoidoscope has a light and tiny camera in it. The caregiver uses the sigmoidoscope to look at the last third of your colon. °· Colonoscopy. This test is like sigmoidoscopy, but the caregiver looks at the entire colon. This is the most common method for finding and removing polyps. °TREATMENT  °Any polyps will be removed during a sigmoidoscopy or colonoscopy. The polyps are then tested for cancer. °PREVENTION  °To help lower your risk of getting more colon polyps: °· Eat plenty of fruits and vegetables. Avoid eating fatty foods. °· Do not smoke. °· Avoid drinking alcohol. °· Exercise every day. °· Lose weight if recommended by your caregiver. °· Eat plenty of calcium and folate. Foods that are rich in calcium include milk, cheese, and broccoli. Foods that are rich in folate include chickpeas, kidney beans, and spinach. °HOME CARE INSTRUCTIONS °Keep all follow-up appointments as directed by your caregiver. You may need periodic exams to check for polyps. °SEEK MEDICAL CARE IF: °You notice bleeding during a bowel movement. °  °This information is not intended to replace advice given to you by your health care provider. Make sure you discuss any questions you have with your health care provider. °  °Document Released: 08/18/2004 Document Revised: 12/13/2014 Document Reviewed: 02/01/2012 °Elsevier Interactive Patient Education ©2016 [employer] ° °

## 2016-07-15 NOTE — Progress Notes (Signed)
Patient will follow up with her PCP who manages her heart medication.  Husband and Patient verbalized understanding to follow up with continued low heart rate. History of tachycardia with subsequent bradycardia.

## 2016-07-15 NOTE — H&P (View-Only) (Signed)
Primary Care Physician:  Petra Kuba, MD Primary Gastroenterologist:  Dr. Gala Romney  Chief Complaint  Patient presents with  . Colonoscopy    5 year recall    HPI:   Shelley Mitchell is a 75 y.o. female who presents to schedule 5 year repeat colonoscopy. Last colonoscopy performed 06/21/2011 for history of colonic adenomas, which found ileocecal valve polyp status post removal, anal canal hemorrhoids and papilla. Surgical pathology found the polyp to be tubular adenoma. Recommended repeat colonoscopy in 5 years.  Today she states she's doing well. Denies abdominal pain, N/V, fever, chills, unintentional weight loss, acute change in bowel habits. Denies chest pain, dyspnea, dizziness, lightheadedness, syncope, near syncope. Denies any other upper or lower GI symptoms.  Past Medical History  Diagnosis Date  . Hx of adenomatous colonic polyps 1999  . Osteoarthritis   . HTN (hypertension)   . Hyperlipidemia   . Obesity   . Endometrial polyp     Dr. Elonda Husky  . Tachycardia     subsequently with bradycardia (?secondary to meds)    Past Surgical History  Procedure Laterality Date  . Tubal ligation    . Cholecystectomy    . Colonoscopy  03/2002    Dr. Jamesetta Geralds internal hemorrhoids  . 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 Colonoscopy  06/21/2011    Procedure: COLONOSCOPY;  Surgeon: Daneil Dolin, MD;  Location: [facility];  Service: Endoscopy;  Laterality: N/A;    Current Outpatient Prescriptions  Medication Sig Dispense Refill  . aspirin 81 MG EC tablet Take 81 mg by mouth daily.      . CELEBREX 200 MG capsule     . hydrALAZINE (APRESOLINE) 100 MG tablet     . hydrochlorothiazide 50 MG tablet     . losartan (COZAAR) 100 MG tablet     . medroxyPROGESTERone (PROVERA) 10 MG tablet Take 1 tablet (10 mg total) by mouth daily. 30 tablet 11  . NIFEdipine (PROCARDIA XL/ADALAT-CC) 90 MG 24 hr tablet     . pravastatin (PRAVACHOL) 40 MG tablet      No current  facility-administered medications for this visit.    Allergies as of 06/23/2016  . (No Known Allergies)    Family History  Problem Relation Age of Onset  . Diabetes Brother   . Diabetes Sister   . Cancer Father     Likely prostate  . Stroke Mother 50  . Colon cancer Neg Hx     Social History   Social History  . Marital Status: Married    Spouse Name: N/A  . Number of Children: 4  . Years of Education: N/A   Occupational History  .     Social History Main Topics  . Smoking status: Never Smoker   . Smokeless tobacco: Never Used  . Alcohol Use: No  . Drug Use: No  . Sexual Activity: Not on file   Other Topics Concern  . Not on file   Social History Narrative    Review of Systems: General: Negative for anorexia, weight loss, fever, chills, fatigue, weakness. ENT: Negative for hoarseness, difficulty swallowing. CV: Negative for chest pain, angina, palpitations, peripheral edema.  Respiratory: Negative for dyspnea at rest, cough, sputum, wheezing.  GI: See history of present illness. MS: Chronic joint pain.  Derm: Negative for rash or itching.  Endo: Negative for unusual weight change.  Heme: Negative for bruising or bleeding. Allergy: Negative for rash or hives.    Physical Exam: BP 128/67 mmHg  Pulse 48  Temp(Src) 98.5 F (36.9 C) (Oral)  Ht 5\' 3"  (1.6 m)  Wt 250 lb (113.399 kg)  BMI 44.30 kg/m2 General:   Alert and oriented. Pleasant and cooperative. Well-nourished and well-developed.  Head:  Normocephalic and atraumatic. Eyes:  Without icterus, sclera clear and conjunctiva pink.  Ears:  Normal auditory acuity. Cardiovascular:  S1, S2 present without murmurs appreciated. Extremities without clubbing or edema. Respiratory:  Clear to auscultation bilaterally. No wheezes, rales, or rhonchi. No distress.  Gastrointestinal:  +BS, obese but soft, non-tender and non-distended. No HSM noted. No guarding or rebound. No masses appreciated.  Rectal:  Deferred    Neurologic:  Alert and oriented x4;  grossly normal neurologically. Psych:  Alert and cooperative. Normal mood and affect. Heme/Lymph/Immune: No excessive bruising noted.    06/23/2016 11:34 AM   Disclaimer: This note was dictated with voice recognition software. Similar sounding words can inadvertently be transcribed and may not be corrected upon review.

## 2016-07-15 NOTE — Op Note (Signed)
[facility] Patient Name: Shelley Mitchell Procedure Date: 07/15/2016 12:49 PM MRN: [id number] Date of Birth: 05/04/1941 Attending MD: Norvel Richards , MD CSN: [id number] Age: 75 Admit Type: Outpatient Procedure:                Colonoscopy with multiple snare polypectomies Indications:              High risk colon cancer surveillance: Personal                            history of colonic polyps Providers:                Norvel Richards, MD, Renda Rolls, RN, Randa Spike, Technician Referring MD:              Medicines:                Midazolam 2 mg IV, Meperidine 25 mg IV, Ondansetron                            4 mg IV Complications:            No immediate complications. Estimated Blood Loss:     Estimated blood loss: none. Procedure:                Pre-Anesthesia Assessment:                           - Prior to the procedure, a History and Physical                            was performed, and patient medications and                            allergies were reviewed. The patient's tolerance of                            previous anesthesia was also reviewed. The risks                            and benefits of the procedure and the sedation                            options and risks were discussed with the patient.                            All questions were answered, and informed consent                            was obtained. [ASA Grade]. After reviewing the                            risks and benefits, the patient was deemed in  satisfactory condition to undergo the procedure.                           - Prior to the procedure, a History and Physical                            was performed, and patient medications and                            allergies were reviewed. The patient's tolerance of                            previous anesthesia was also reviewed. The risks                            and  benefits of the procedure and the sedation                            options and risks were discussed with the patient.                            All questions were answered, and informed consent                            was obtained. Prior Anticoagulants: The patient has                            taken no previous anticoagulant or antiplatelet                            agents. ASA Grade Assessment: III - A patient with                            severe systemic disease. After reviewing the risks                            and benefits, the patient was deemed in                            satisfactory condition to undergo the procedure.                           After obtaining informed consent, the colonoscope                            was passed under direct vision. Throughout the                            procedure, the patient's blood pressure, pulse, and                            oxygen saturations were monitored continuously. The  [id number]) scope was introduced through                            the anus and advanced to the the cecum, identified                            by appendiceal orifice and ileocecal valve. The                            quality of the bowel preparation was adequate. The                            entire colon was well visualized. The entire colon                            was well visualized. The ileocecal valve,                            appendiceal orifice, and rectum were photographed. Scope In: 1:10:13 PM Scope Out: 1:33:48 PM Scope Withdrawal Time: 0 hours 15 minutes 55 seconds  Total Procedure Duration: 0 hours 23 minutes 35 seconds  Findings:      The perianal and digital rectal examinations were normal.      A 8 mm polyp was found in the hepatic flexure. The polyp was       semi-pedunculated. The polyp was removed with a hot snare. Resection and       retrieval were complete. Estimated blood loss:  none.      A 12 mm polyp was found in the hepatic flexure. The polyp was       semi-pedunculated. The polyp was removed with a hot snare. Resection and       retrieval were complete. Estimated blood loss: none.      The exam was otherwise without abnormality on direct and retroflexion       views. Impression:               - One 8 mm polyp at the hepatic flexure, removed                            with a hot snare. Resected and retrieved.                           - One 12 mm polyp at the hepatic flexure, removed                            with a hot snare. Resected and retrieved.                           - The examination was otherwise normal on direct                            and retroflexion views. Moderate Sedation:      Moderate (conscious) sedation was administered by the endoscopy nurse       and supervised by the endoscopist. The following parameters were  monitored: oxygen saturation, heart rate, blood pressure, respiratory       rate, EKG, adequacy of pulmonary ventilation, and response to care.       Total physician intraservice time was 27 minutes. Recommendation:           - Patient has a contact number available for                            emergencies. The signs and symptoms of potential                            delayed complications were discussed with the                            patient. Return to normal activities tomorrow.                            Written discharge instructions were provided to the                            patient.                           - Advance diet as tolerated.                           - Continue present medications.                           - Await pathology results.                           - Repeat colonoscopy date to be determined after                            pending pathology results are reviewed for                            surveillance based on pathology results.                           - Return to GI  clinic (date not yet determined). Procedure Code(s):        --- Professional ---                           [phone], Colonoscopy, flexible; with removal of                            tumor(s), polyp(s), or other lesion(s) by snare                            technique                           [id number], Moderate sedation services provided by the  same physician or other qualified health care                            professional performing the diagnostic or                            therapeutic service that the sedation supports,                            requiring the presence of an independent trained                            observer to assist in the monitoring of the                            patient's level of consciousness and physiological                            status; initial 15 minutes of intraservice time,                            patient age 17 years or older                           [phone], Moderate sedation services; each additional                            15 minutes intraservice time Diagnosis Code(s):        --- Professional ---                           Z86.010, Personal history of colonic polyps                           D12.3, Benign neoplasm of transverse colon (hepatic                            flexure or splenic flexure) CPT copyright 2016 American [employer]. All rights reserved. The codes documented in this report are preliminary and upon coder review may  be revised to meet current compliance requirements. Cristopher Estimable. Jailan Trimm, MD Norvel Richards, MD 07/15/2016 2:41:08 PM This report has been signed electronically. Number of Addenda: 0

## 2016-07-15 NOTE — Interval H&P Note (Signed)
History and Physical Interval Note:  07/15/2016 1:02 PM  Shelley Mitchell  has presented today for surgery, with the diagnosis of history of polyps  The various methods of treatment have been discussed with the patient and family. After consideration of risks, benefits and other options for treatment, the patient has consented to  Procedure(s) with comments: COLONOSCOPY (N/A) - 100 as a surgical intervention .  The patient's history has been reviewed, patient examined, no change in status, stable for surgery.  I have reviewed the patient's chart and labs.  Questions were answered to the patient's satisfaction.     Shelley Mitchell  No change. Surveillance colonoscopy per plan. The risks, benefits, limitations, alternatives and imponderables have been reviewed with the patient. Questions have been answered. All parties are agreeable.

## 2016-07-20 ENCOUNTER — Encounter (HOSPITAL_COMMUNITY): Payer: Self-pay | Admitting: Internal Medicine

## 2016-07-20 ENCOUNTER — Encounter: Payer: Self-pay | Admitting: Internal Medicine

## 2016-11-22 ENCOUNTER — Other Ambulatory Visit: Payer: Medicare PPO | Admitting: Obstetrics & Gynecology

## 2016-12-10 ENCOUNTER — Ambulatory Visit (INDEPENDENT_AMBULATORY_CARE_PROVIDER_SITE_OTHER): Payer: Medicare Other | Admitting: Obstetrics & Gynecology

## 2016-12-10 ENCOUNTER — Encounter: Payer: Self-pay | Admitting: Obstetrics & Gynecology

## 2016-12-10 ENCOUNTER — Encounter (INDEPENDENT_AMBULATORY_CARE_PROVIDER_SITE_OTHER): Payer: Self-pay

## 2016-12-10 ENCOUNTER — Other Ambulatory Visit (HOSPITAL_COMMUNITY)
Admission: RE | Admit: 2016-12-10 | Discharge: 2016-12-10 | Disposition: A | Discharge status: Home / Self Care | Payer: Medicare Other | Source: Ambulatory Visit | Attending: Obstetrics & Gynecology | Admitting: Obstetrics & Gynecology

## 2016-12-10 VITALS — BP 120/80 | HR 56 | Wt 250.0 lb

## 2016-12-10 DIAGNOSIS — Z01419 Encounter for gynecological examination (general) (routine) without abnormal findings: Secondary | ICD-10-CM | POA: Diagnosis not present

## 2016-12-10 DIAGNOSIS — Z1211 Encounter for screening for malignant neoplasm of colon: Secondary | ICD-10-CM

## 2016-12-10 DIAGNOSIS — Z1212 Encounter for screening for malignant neoplasm of rectum: Secondary | ICD-10-CM

## 2016-12-10 DIAGNOSIS — Z124 Encounter for screening for malignant neoplasm of cervix: Secondary | ICD-10-CM | POA: Diagnosis present

## 2016-12-10 MED ORDER — MEDROXYPROGESTERONE ACETATE 10 MG PO TABS: 10.0000 mg | ORAL_TABLET | Freq: Every day | ORAL | 11 refills | Status: DC

## 2016-12-10 NOTE — Progress Notes (Signed)
Patient ID: Shelley Mitchell, female   DOB: [birth year], 76 y.o.   MRN: [id number] Subjective:     Shelley Mitchell is a 76 y.o. female here for a routine exam.  No LMP recorded. Patient is postmenopausal. No obstetric history on file. Birth Control Method:  pm Menstrual Calendar(currently): occasional  Current complaints: none.   Current acute medical issues:  Take cyclical provera x 10 days monthly  Due to her history of complex endoemtrial hyperplasia from years ago, she has rare bleeding   Recent Gynecologic History No LMP recorded. Patient is postmenopausal. Last Pap: 2015,  normal Last mammogram: 2016,  normal  Past Medical History:  Diagnosis Date  . Endometrial polyp    Dr. Elonda Husky  . HTN (hypertension)   . Hx of adenomatous colonic polyps 1999  . Hyperlipidemia   . Obesity   . Osteoarthritis   . Tachycardia    subsequently with bradycardia (?secondary to meds)    Past Surgical History:  Procedure Laterality Date  . 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 CHOLECYSTECTOMY    . COLONOSCOPY  03/2002   Dr. Jamesetta Geralds internal hemorrhoids  . COLONOSCOPY  06/21/2011   Procedure: COLONOSCOPY;  Surgeon: Daneil Dolin, MD;  Location: [facility];  Service: Endoscopy;  Laterality: N/A;  . COLONOSCOPY N/A 07/15/2016   Procedure: COLONOSCOPY;  Surgeon: Daneil Dolin, MD;  Location: [facility];  Service: Endoscopy;  Laterality: N/A;  100  . POLYPECTOMY  07/15/2016   Procedure: POLYPECTOMY;  Surgeon: Daneil Dolin, MD;  Location: [facility];  Service: Endoscopy;;  Splenic Flexure polyps x 2 removed via hot snare  . TUBAL LIGATION      OB History    No data available      Social History   Social History  . Marital status: Married    Spouse name: N/A  . Number of children: 4  . Years of education: N/A   Occupational History  .  Retired   Social History Main Topics  . Smoking status: Never Smoker  . Smokeless tobacco: Never Used  . Alcohol use No  . Drug use: No   . Sexual activity: Not Asked   Other Topics Concern  . None   Social History Narrative  . None    Family History  Problem Relation Age of Onset  . Diabetes Brother   . Diabetes Sister   . Cancer Father     Likely prostate  . Stroke Mother 84  . Colon cancer Neg Hx      Current Outpatient Prescriptions:  .  aspirin 81 MG EC tablet, Take 81 mg by mouth daily.  , Disp: , Rfl:  .  CELEBREX 200 MG capsule, , Disp: , Rfl:  .  hydrALAZINE (APRESOLINE) 100 MG tablet, , Disp: , Rfl:  .  hydrochlorothiazide 50 MG tablet, , Disp: , Rfl:  .  medroxyPROGESTERone (PROVERA) 10 MG tablet, Take 1 tablet (10 mg total) by mouth daily., Disp: 30 tablet, Rfl: 11 .  NIFEdipine (PROCARDIA XL/ADALAT-CC) 90 MG 24 hr tablet, Take 90 mg by mouth daily. , Disp: , Rfl:  .  pravastatin (PRAVACHOL) 40 MG tablet, Take 40 mg by mouth daily. , Disp: , Rfl:   Review of Systems  Review of Systems  Constitutional: Negative for fever, chills, weight loss, malaise/fatigue and diaphoresis.  HENT: Negative for hearing loss, ear pain, nosebleeds, congestion, sore throat, neck pain, tinnitus and ear discharge.   Eyes: Negative for blurred vision,  double vision, photophobia, pain, discharge and redness.  Respiratory: Negative for cough, hemoptysis, sputum production, shortness of breath, wheezing and stridor.   Cardiovascular: Negative for chest pain, palpitations, orthopnea, claudication, leg swelling and PND.  Gastrointestinal: negative for abdominal pain. Negative for heartburn, nausea, vomiting, diarrhea, constipation, blood in stool and melena.  Genitourinary: Negative for dysuria, urgency, frequency, hematuria and flank pain.  Musculoskeletal: Negative for myalgias, back pain, joint pain and falls.  Skin: Negative for itching and rash.  Neurological: Negative for dizziness, tingling, tremors, sensory change, speech change, focal weakness, seizures, loss of consciousness, weakness and headaches.   Endo/Heme/Allergies: Negative for environmental allergies and polydipsia. Does not bruise/bleed easily.  Psychiatric/Behavioral: Negative for depression, suicidal ideas, hallucinations, memory loss and substance abuse. The patient is not nervous/anxious and does not have insomnia.        Objective:  Blood pressure 120/80, pulse (!) 56, weight 250 lb (113.4 kg).   Physical Exam  Vitals reviewed. Constitutional: She is oriented to person, place, and time. She appears well-developed and well-nourished.  HENT:  Head: Normocephalic and atraumatic.        Right Ear: External ear normal.  Left Ear: External ear normal.  Nose: Nose normal.  Mouth/Throat: Oropharynx is clear and moist.  Eyes: Conjunctivae and EOM are normal. Pupils are equal, round, and reactive to light. Right eye exhibits no discharge. Left eye exhibits no discharge. No scleral icterus.  Neck: Normal range of motion. Neck supple. No tracheal deviation present. No thyromegaly present.  Cardiovascular: Normal rate, regular rhythm, normal heart sounds and intact distal pulses.  Exam reveals no gallop and no friction rub.   No murmur heard. Respiratory: Effort normal and breath sounds normal. No respiratory distress. She has no wheezes. She has no rales. She exhibits no tenderness.  GI: Soft. Bowel sounds are normal. She exhibits no distension and no mass. There is no tenderness. There is no rebound and no guarding.  Genitourinary:  Breasts no masses skin changes or nipple changes bilaterally      Vulva is normal without lesions Vagina is pink moist without discharge Cervix normal in appearance and pap is done Uterus is normal size shape and contour Adnexa is negative with normal sized ovaries   Musculoskeletal: Normal range of motion. She exhibits no edema and no tenderness.  Neurological: She is alert and oriented to person, place, and time. She has normal reflexes. She displays normal reflexes. No cranial nerve deficit. She  exhibits normal muscle tone. Coordination normal.  Skin: Skin is warm and dry. No rash noted. No erythema. No pallor.  Psychiatric: She has a normal mood and affect. Her behavior is normal. Judgment and thought content normal.       Assessment:    Healthy female exam.    Plan:    Follow up in: 1 year.    Provera 10 mg x 10 days monthly Mammogram in february

## 2016-12-15 LAB — CYTOLOGY - PAP: Diagnosis: NEGATIVE

## 2017-02-09 ENCOUNTER — Other Ambulatory Visit: Payer: Self-pay | Admitting: Obstetrics & Gynecology

## 2017-02-09 DIAGNOSIS — Z1231 Encounter for screening mammogram for malignant neoplasm of breast: Secondary | ICD-10-CM

## 2017-03-04 ENCOUNTER — Ambulatory Visit (HOSPITAL_COMMUNITY)
Admission: RE | Admit: 2017-03-04 | Discharge: 2017-03-04 | Disposition: A | Discharge status: Home / Self Care | Payer: Medicare Other | Source: Ambulatory Visit | Attending: Obstetrics & Gynecology | Admitting: Obstetrics & Gynecology

## 2017-03-04 DIAGNOSIS — Z1231 Encounter for screening mammogram for malignant neoplasm of breast: Secondary | ICD-10-CM

## 2017-03-04 IMAGING — MG 2D DIGITAL SCREENING BILATERAL MAMMOGRAM WITH CAD AND ADJUNCT TO
6 series · 6 of 14 positions shown · non-contrast
Comparison: Previous exam(s).

CLINICAL DATA: Screening.

EXAM:
2D DIGITAL SCREENING BILATERAL MAMMOGRAM WITH CAD AND ADJUNCT TOMO

[L CC]
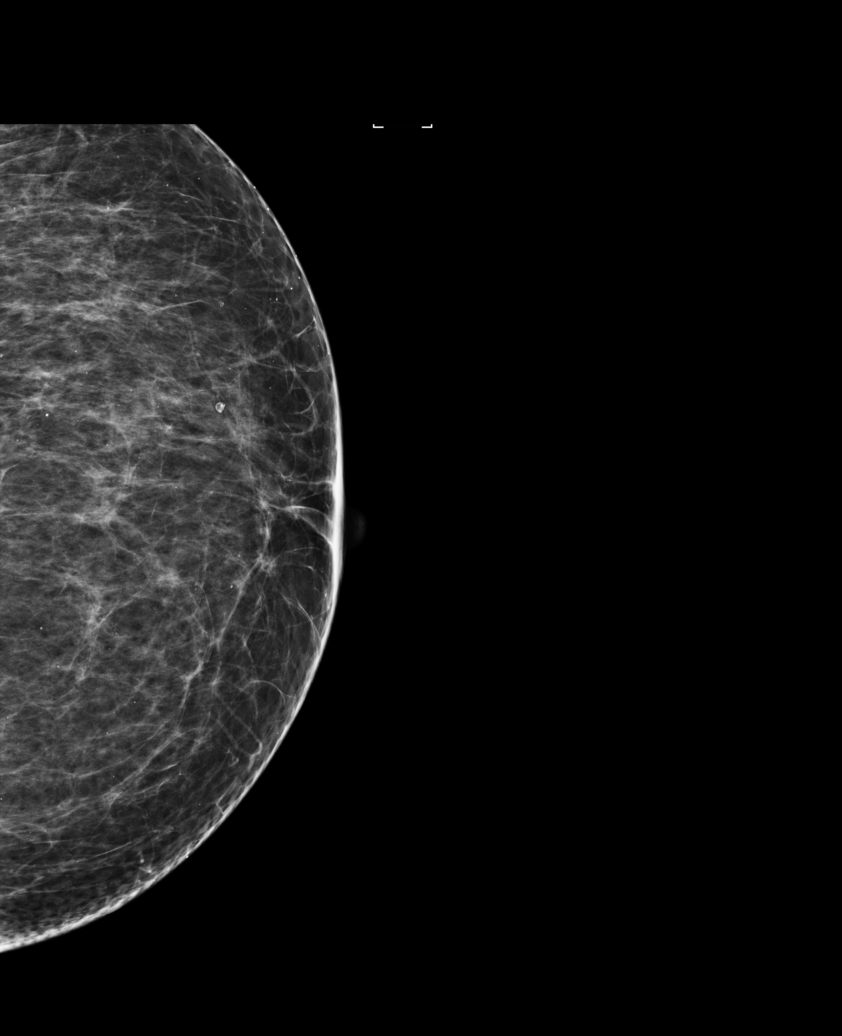

[L MLO]
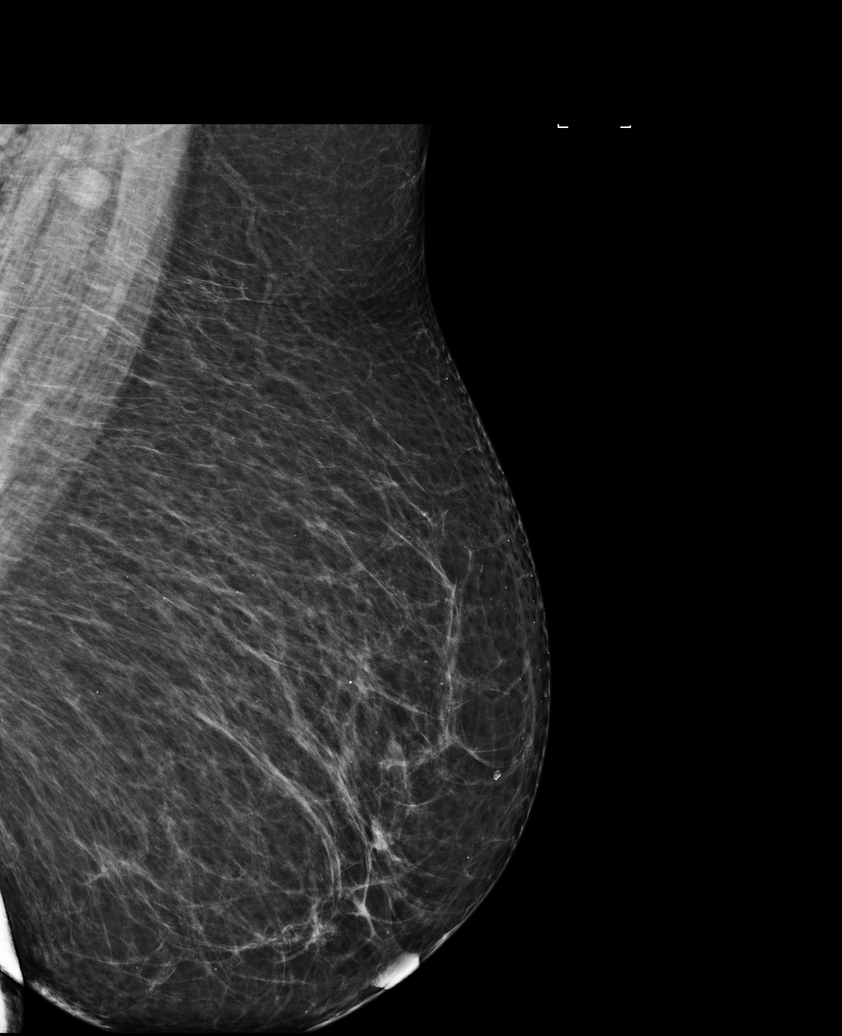

[R CC]
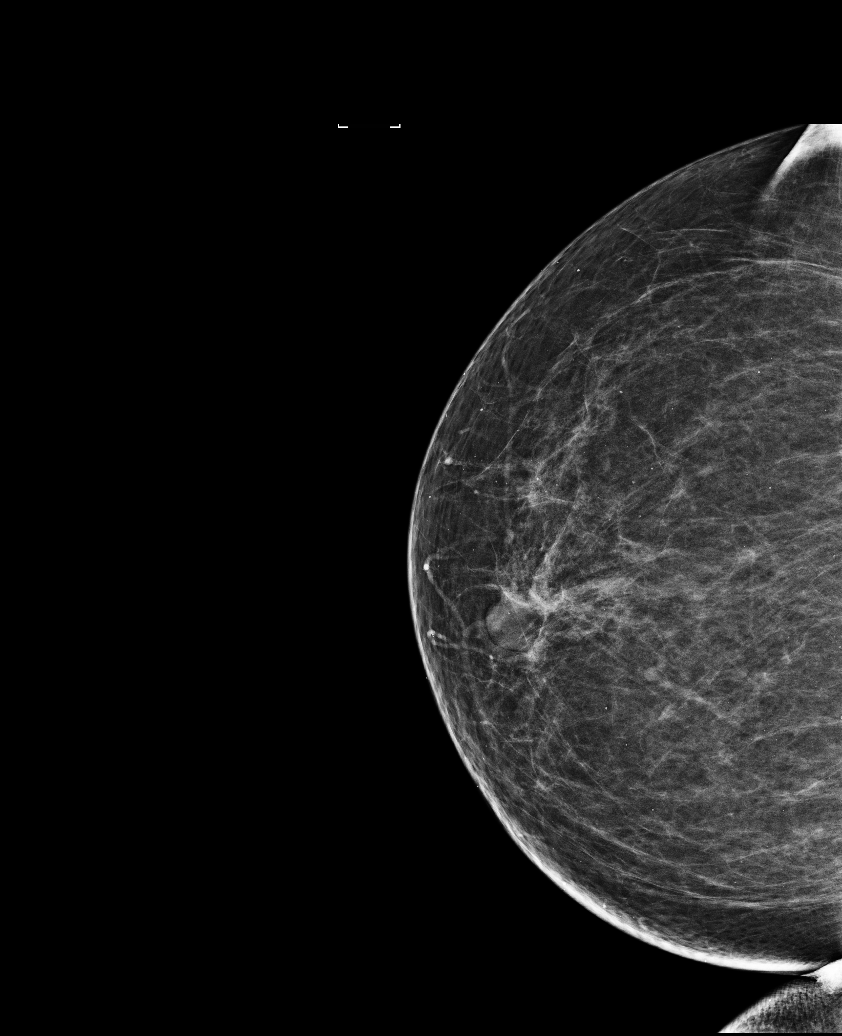

[R MLO]
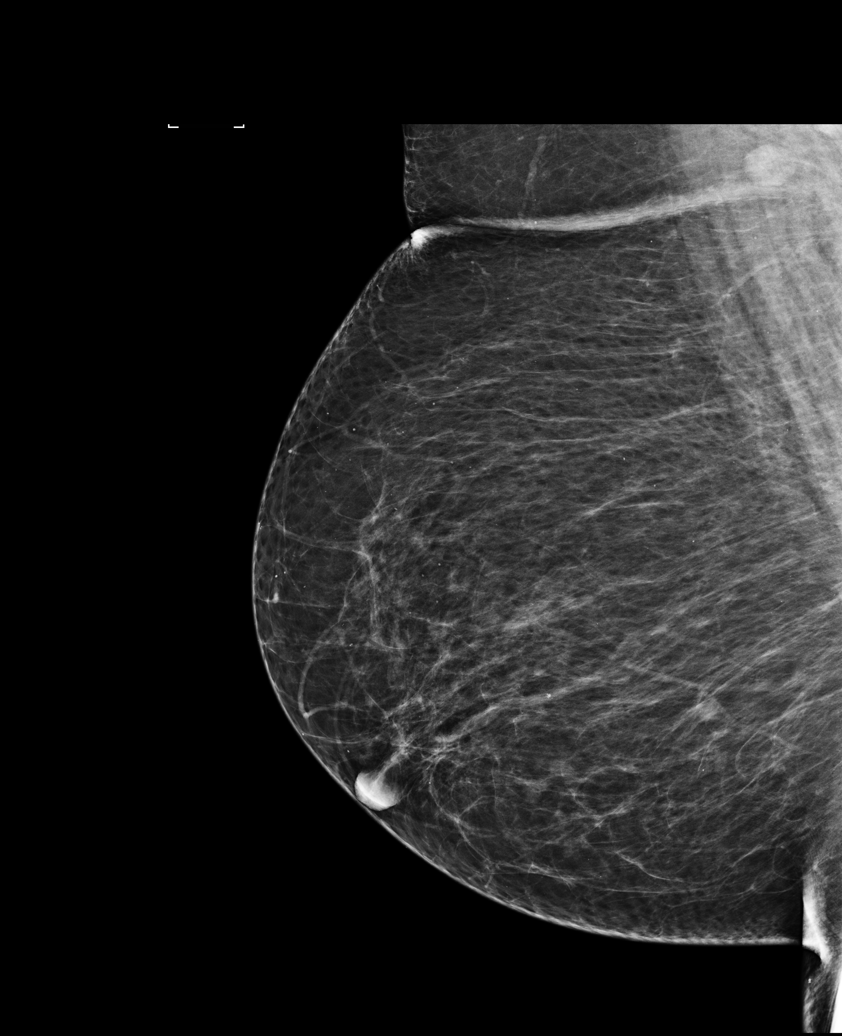

[R CC tomo · tomo slice 43/85.0]
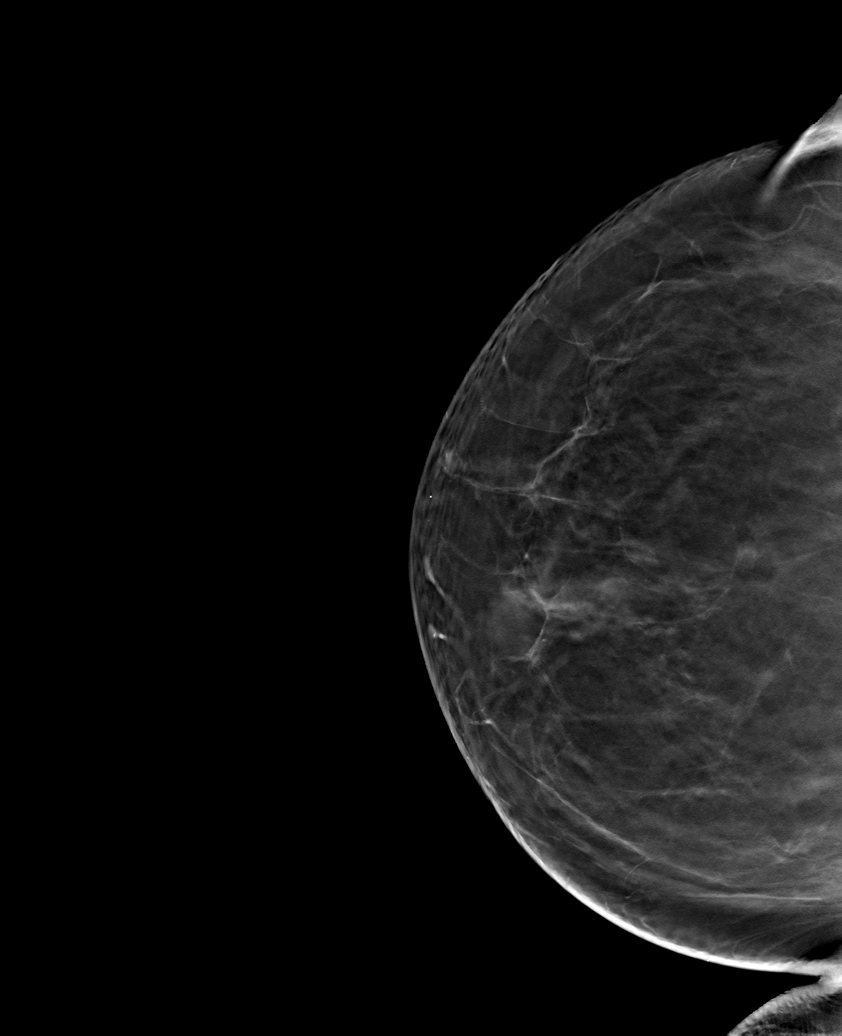

[R MLO tomo · tomo slice 51/100.0]
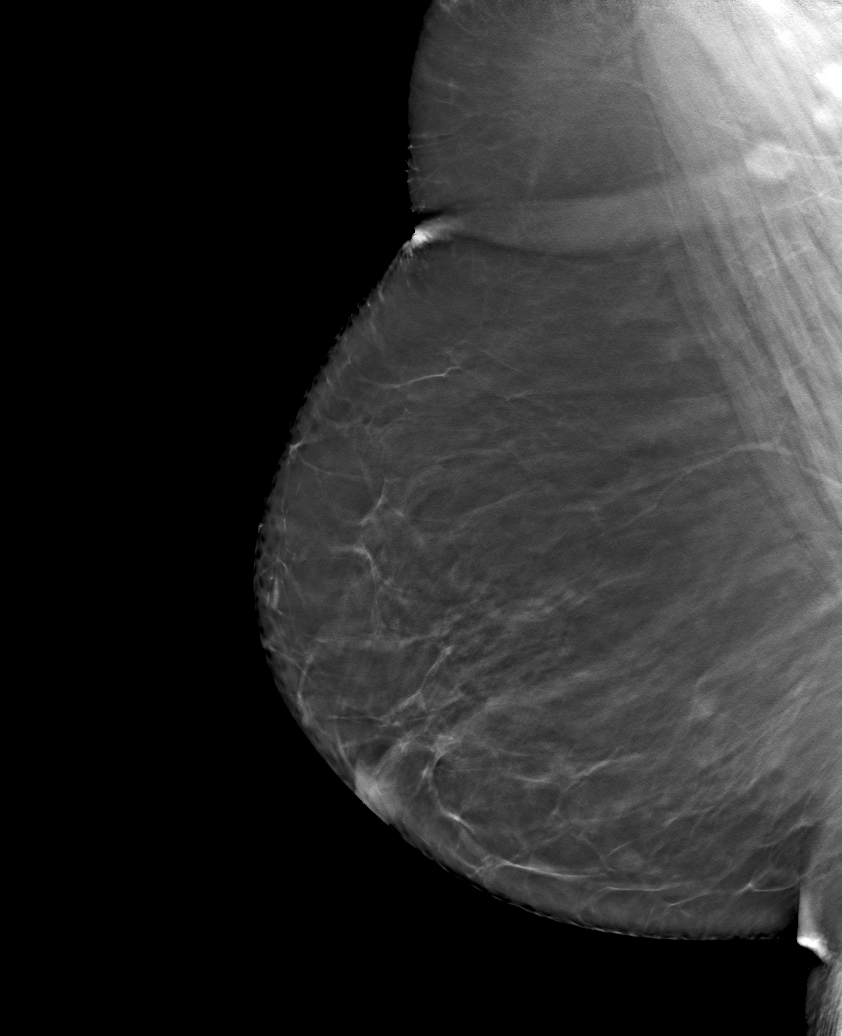

[6 of 14 positions shown; findings below may reference images not displayed]

ACR Breast Density Category b: There are scattered areas of
fibroglandular density.
FINDINGS: In the right breast, a possible asymmetry warrants further
evaluation. In the left breast, no findings suspicious for
malignancy. Images were processed with CAD.
IMPRESSION: Further evaluation is suggested for possible asymmetry in the right
breast.

RECOMMENDATION:
Diagnostic mammogram and possibly ultrasound of the right breast.
(Code:[SH])

The patient will be contacted regarding the findings, and additional
imaging will be scheduled.

BI-RADS CATEGORY  0: Incomplete. Need additional imaging evaluation
and/or prior mammograms for comparison.

## 2017-03-07 ENCOUNTER — Other Ambulatory Visit: Payer: Self-pay | Admitting: Obstetrics & Gynecology

## 2017-03-07 DIAGNOSIS — R928 Other abnormal and inconclusive findings on diagnostic imaging of breast: Secondary | ICD-10-CM

## 2017-03-22 ENCOUNTER — Ambulatory Visit (HOSPITAL_COMMUNITY)
Admission: RE | Admit: 2017-03-22 | Discharge: 2017-03-22 | Disposition: A | Discharge status: Home / Self Care | Payer: Medicare Other | Source: Ambulatory Visit | Attending: Obstetrics & Gynecology | Admitting: Obstetrics & Gynecology

## 2017-03-22 DIAGNOSIS — Z1231 Encounter for screening mammogram for malignant neoplasm of breast: Secondary | ICD-10-CM | POA: Diagnosis present

## 2017-03-22 DIAGNOSIS — R928 Other abnormal and inconclusive findings on diagnostic imaging of breast: Secondary | ICD-10-CM

## 2017-04-05 ENCOUNTER — Encounter (HOSPITAL_COMMUNITY): Payer: Medicare Other

## 2017-12-12 ENCOUNTER — Other Ambulatory Visit: Payer: Self-pay | Admitting: Obstetrics & Gynecology

## 2017-12-12 ENCOUNTER — Other Ambulatory Visit: Payer: Medicare Other | Admitting: Obstetrics & Gynecology

## 2017-12-20 ENCOUNTER — Encounter: Payer: Self-pay | Admitting: Adult Health

## 2017-12-20 ENCOUNTER — Ambulatory Visit: Payer: Medicare Other | Admitting: Adult Health

## 2017-12-20 ENCOUNTER — Encounter (INDEPENDENT_AMBULATORY_CARE_PROVIDER_SITE_OTHER): Payer: Self-pay

## 2017-12-20 VITALS — BP 130/80 | HR 97 | Ht 63.0 in | Wt 252.0 lb

## 2017-12-20 DIAGNOSIS — Z1212 Encounter for screening for malignant neoplasm of rectum: Secondary | ICD-10-CM | POA: Diagnosis not present

## 2017-12-20 DIAGNOSIS — N950 Postmenopausal bleeding: Secondary | ICD-10-CM | POA: Diagnosis not present

## 2017-12-20 DIAGNOSIS — Z1211 Encounter for screening for malignant neoplasm of colon: Secondary | ICD-10-CM

## 2017-12-20 DIAGNOSIS — Z01411 Encounter for gynecological examination (general) (routine) with abnormal findings: Secondary | ICD-10-CM

## 2017-12-20 DIAGNOSIS — Z8742 Personal history of other diseases of the female genital tract: Secondary | ICD-10-CM | POA: Insufficient documentation

## 2017-12-20 DIAGNOSIS — Z01419 Encounter for gynecological examination (general) (routine) without abnormal findings: Secondary | ICD-10-CM

## 2017-12-20 LAB — HEMOCCULT GUIAC POC 1CARD (OFFICE): Fecal Occult Blood, POC: NEGATIVE

## 2017-12-20 MED ORDER — MEDROXYPROGESTERONE ACETATE 5 MG PO TABS
5.0000 mg | ORAL_TABLET | Freq: Every day | ORAL | 12 refills | Status: DC
Start: 2017-12-20 — End: 2019-01-19

## 2017-12-20 NOTE — Progress Notes (Signed)
Patient ID: Shelley Mitchell, female   DOB: [birth year], 77 y.o.   MRN: [id number] History of Present Illness:  Shelley Mitchell is a 77 year old black female married, PM if for well woman gyn exam, she had normal pap 12/10/16 with Dr Elonda Husky.She is taking provera 10 mg for 10 days every month for history of endometrial hyperplasia.She says she bleeds some every month for about 3 days after taking provera.  Her husband is with her.  PCP is Dr Shade Flood.   Current Medications, Allergies, Past Medical History, Past Surgical History, Family History and Social History were reviewed in [employer] record.     Review of Systems: Patient denies any headaches, hearing loss, fatigue, blurred vision, shortness of breath, chest pain, abdominal pain, problems with bowel movements, urination(has trouble starting stream at times), or intercourse(not having sex). No mood swings.+PMB on provera  +pain in knees,esp right leg, had back surgery in June, and is using walker.   Physical Exam:BP 130/80 (BP Location: Left Arm, Patient Position: Sitting, Cuff Size: Large)   Pulse 97   Ht 5\' 3"  (1.6 m)   Wt 252 lb (114.3 kg)   BMI 44.64 kg/m  General:  Well developed, well nourished, no acute distress Skin:  Warm and dry Neck:  Midline trachea, normal thyroid, good ROM, no lymphadenopathy,no carotid bruits heard  Lungs; Clear to auscultation bilaterally Breast:  No dominant palpable mass, retraction, or nipple discharge Cardiovascular: Regular rate and rhythm Abdomen:  Soft, non tender, no hepatosplenomegaly Pelvic:  External genitalia is normal in appearance, no lesions.  The vagina is atrophic and has red blood in vault. Urethra has no lesions or masses. The cervix is poorly seen.  Uterus is felt to be normal size, shape, and contour.  No adnexal masses or tenderness noted.Bladder is non tender, no masses felt. Rectal: Good sphincter tone, no polyps, or hemorrhoids felt.  Hemoccult  negative. Extremities/musculoskeletal:  No swelling or varicosities noted, no clubbing or cyanosis Psych:  No mood changes, alert and cooperative,seems happy PHQ 9 score 11 is on meds and denies being suicidal. Discussed with Dr Elonda Husky will change provera to 5 mg daily and no need for [place] per him since on provera, at this time.   Impression: 1. Encounter for well woman exam with routine gynecological exam   2. Screening for colorectal cancer   3. History of endometrial hyperplasia   4. PMB (postmenopausal bleeding)       Plan: Rx provera 5 mg #30 take 1 daily with 12 refills F/U in 3 months by phone or appt for any bleeding, may get [place] if bleeding continues after dose change  Physical in 1 year Pap in 2021 if desired  Mammogram yearly  Labs with PCP

## 2018-02-14 ENCOUNTER — Other Ambulatory Visit: Payer: Self-pay | Admitting: Obstetrics & Gynecology

## 2018-02-14 DIAGNOSIS — Z1231 Encounter for screening mammogram for malignant neoplasm of breast: Secondary | ICD-10-CM

## 2018-03-06 ENCOUNTER — Ambulatory Visit (HOSPITAL_COMMUNITY): Payer: Medicare Other

## 2018-03-13 ENCOUNTER — Ambulatory Visit (HOSPITAL_COMMUNITY)
Admission: RE | Admit: 2018-03-13 | Discharge: 2018-03-13 | Disposition: A | Discharge status: Home / Self Care | Payer: Medicare Other | Source: Ambulatory Visit | Attending: Obstetrics & Gynecology | Admitting: Obstetrics & Gynecology

## 2018-03-13 ENCOUNTER — Encounter (HOSPITAL_COMMUNITY): Payer: Self-pay

## 2018-03-13 DIAGNOSIS — Z1231 Encounter for screening mammogram for malignant neoplasm of breast: Secondary | ICD-10-CM | POA: Insufficient documentation

## 2018-03-20 ENCOUNTER — Ambulatory Visit: Payer: Medicare Other | Admitting: Adult Health

## 2019-01-16 ENCOUNTER — Ambulatory Visit: Payer: Medicare Other | Attending: Neurology | Admitting: Physical Therapy

## 2019-01-16 ENCOUNTER — Other Ambulatory Visit: Payer: Self-pay

## 2019-01-16 ENCOUNTER — Encounter: Payer: Self-pay | Admitting: Physical Therapy

## 2019-01-16 DIAGNOSIS — R262 Difficulty in walking, not elsewhere classified: Secondary | ICD-10-CM

## 2019-01-16 DIAGNOSIS — M6281 Muscle weakness (generalized): Secondary | ICD-10-CM

## 2019-01-16 NOTE — Therapy (Signed)
[place] MAIN [facility] SERVICES [address] [place], [place], [address] Phone: [phone]   Fax:  [phone]  Physical Therapy Evaluation  Patient Details  Name: Shelley Mitchell MRN: [id number] Date of Birth: [birth year] Referring Provider (PT): Vladimir Crofts,   Encounter Date: 01/16/2019  PT End of Session - 01/16/19 1328    Visit Number  1    Number of Visits  17    Date for PT Re-Evaluation  03/13/19    PT Start Time  0105    PT Stop Time  0200    PT Time Calculation (min)  55 min    Equipment Utilized During Treatment  Gait belt    Activity Tolerance  Patient tolerated treatment well;Patient limited by fatigue    Behavior During Therapy  [facility] for tasks assessed/performed       Past Medical History:  Diagnosis Date  . Endometrial polyp    Dr. Elonda Husky  . History of endometrial hyperplasia   . HTN (hypertension)   . Hx of adenomatous colonic polyps 1999  . Hyperlipidemia   . Mental disorder    GAD  . Obesity   . Osteoarthritis   . Tachycardia    subsequently with bradycardia (?secondary to meds)    Past Surgical History:  Procedure Laterality Date  . BACK SURGERY    . 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 CHOLECYSTECTOMY    . COLONOSCOPY  03/2002   Dr. Jamesetta Geralds internal hemorrhoids  . COLONOSCOPY  06/21/2011   Procedure: COLONOSCOPY;  Surgeon: Daneil Dolin, MD;  [facility];  Service: Endoscopy;  Laterality: N/A;  . COLONOSCOPY N/A 07/15/2016   Procedure: COLONOSCOPY;  Surgeon: Daneil Dolin, MD;  Location[facility];  Service: Endoscopy;  Laterality: N/A;  100  . POLYPECTOMY  07/15/2016   Procedure: POLYPECTOMY;  Surgeon: Daneil Dolin, MD;  [facility];  Service: Endoscopy;;  Splenic Flexure polyps x 2 removed via hot snare  . TUBAL LIGATION      There were no vitals filed for this visit.   Subjective Assessment - 01/16/19 1315    Subjective  She is getting weaker , she cant get  her feet in and out of the car, she cant get her feet up in the bed, she is having difficulty with moving in the bed.  She walks 10 feet leaning on the AD.   Patient is accompained by:  Family member    Pertinent History  Patient had back surgery 05/2017 . she was not able to walk outside of the house following the back surgery. She was walking indoors with RW 2 years ago. Her legs began getting weak and now she cant stand up and cant walk unless she has the Rw. Currently she is able to walk short distances.     How long can you stand comfortably?  5 mins    How long can you walk comfortably?  less than 1 minute    Patient Stated Goals  She wants to be able to get stronger and walk better.     Currently in Pain?  Yes    Pain Score  3     Pain Location  Leg    Pain Orientation  Right    Pain Descriptors / Indicators  Aching    Pain Type  Chronic pain    Pain Onset  More than a month ago    Pain Frequency  Intermittent  Aggravating Factors   standing    Pain Relieving Factors  leg     Effect of Pain on Daily Activities  difficult to ambulate    Multiple Pain Sites  No         [facility] PT Assessment - 01/16/19 1320      Assessment   Medical Diagnosis  weakness    Referring Provider (PT)  Manuella Ghazi, Hemang K,    Hand Dominance  Right    Prior Therapy  no      Precautions   Precautions  Fall      Restrictions   Weight Bearing Restrictions  No      Balance Screen   Has the patient fallen in the past 6 months  Yes    How many times?  2    Has the patient had a decrease in activity level because of a fear of falling?   Yes    Is the patient reluctant to leave their home because of a fear of falling?   Yes      [place] residence    Living Arrangements  Spouse/significant other    Available Help at Discharge  Family    Type of [place]  One level    [place] - 2 wheels;Walker - 4  wheels;Bedside commode;Shower seat;Grab bars - tub/shower      Prior Function   Level of Independence  Independent with gait;Needs assistance with ADLs;Needs assistance with homemaking;Needs assistance with transfers    Vocation  Retired   Patient leans over 90 degress during ambulation    Leisure  TV      Cognition   Overall Cognitive Status  Within Functional Limits for tasks assessed        PAIN: intermittent pain to back and right hip   POSTURE: poor standing posture with trunk flexed    PROM/AROM: Hip flex limited by girth   STRENGTH:  Graded on a 0-5 scale Muscle Group Left Right                          Hip Flex 1/5 1/5  Hip Abd 1/5 2/5  Hip Add 1/5 1/5  Hip Ext NT NT      Knee Flex 3/5 3/5  Knee Ext 3/5 3+/5  Ankle DF -3/5 -3/5  Ankle PF 3/5 3/5   SENSATION: RLE numbness intermittenly  FUNCTIONAL MOBILITY: Needs max assist for rolling right side Unable to roll supine to prone Severe difficulty with supine to sit  Needs higher chair to perform transfers sit to stand with definite use of hands   BALANCE: Standing Dynamic Balance  Normal Stand independently unsupported, able to weight shift and cross midline maximally   Good Stand independently unsupported, able to weight shift and cross midline moderately   Good-/Fair+ Stand independently unsupported, able to weight shift across midline minimally   Fair Stand independently unsupported, weight shift, and reach ipsilaterally, loss of balance when crossing midline   Poor+ Able to stand with Min A and reach ipsilaterally, unable to weight shift   Poor Able to stand with Mod A and minimally reach ipsilaterally, unable to cross midline. x   Static Standing Balance  Normal Able to maintain standing balance against maximal resistance   Good Able to maintain standing balance against moderate resistance   Good-/Fair+ Able  to maintain standing balance against minimal resistance   Fair Able to stand unsupported  without UE support and without LOB for 1-2 min x  Fair- Requires Min A and UE support to maintain standing without loss of balance   Poor+ Requires mod A and UE support to maintain standing without loss of balance   Poor Requires max A and UE support to maintain standing balance without loss       GAIT: Patient ambulates 10 feet with RW and right knee hyperextending during stance and + trendelenburg, trunk leaning over AD 90 degrees   OUTCOME MEASURES: TEST Outcome Interpretation  5 times sit<>stand Unable to get up out of the 19 inch chair  >60 yo, >15 sec indicates increased risk for falls  10 meter walk test    Unable to ambulate >10 feet             <1.0 m/s indicates increased risk for falls; limited community ambulator  Timed up and Go    NA             sec <14 sec indicates increased risk for falls  6 minute walk test     NA           Feet 1000 feet is community Conservator, museum/gallery Assessment 10/56 <36/56 (100% risk for falls), 37-45 (80% risk for falls); 46-51 (>50% risk for falls); 52-55 (lower risk <25% of falls)  LEFS 12/80 Normal 80/80   Treatment HEP:  Hip abd supine x 10 x 2 Marching x 10 x 2 LAQ x 10 x2 CGA and mod verbal cues used throughout with increased in postural sway and LOB most seen with narrow base of support and while on uneven surfaces. Continues to have balance deficits typical with diagnosis. Patient performs intermediate level exercises without pain behaviors and needs verbal cuing for postural alignment and head positioning       Objective measurements completed on examination: See above findings.              PT Education - 01/16/19 1327    Education Details  plan of care    Person(s) Educated  Patient    Methods  Explanation    Comprehension  Verbalized understanding       PT Short Term Goals - 01/16/19 1329      PT SHORT TERM GOAL #1   Title  Patient will be independent in home exercise program to improve strength/mobility  for better functional independence with ADLs.    Time  4    Period  Weeks    Status  New    Target Date  02/13/19      PT SHORT TERM GOAL #2   Title  Patient (> 71 years old) will complete five times sit to stand test in < 15 seconds indicating an increased LE strength and improved balance.    Time  4    Period  Weeks    Status  New    Target Date  02/13/19        PT Long Term Goals - 01/16/19 1424      PT LONG TERM GOAL #1   Title  Patient will increase 10 meter walk test to >1.79m/s as to improve gait speed for better community ambulation and to reduce fall risk.    Time  8    Period  Weeks    Status  New    Target Date  03/13/19  PT LONG TERM GOAL #2   Title  Patient will increase BLE gross strength to 3/5 as to improve functional strength for independent gait, increased standing tolerance and increased ADL ability.    Time  8    Period  Weeks    Status  New    Target Date  03/13/19      PT LONG TERM GOAL #3   Title  Patient will increase lower extremity functional scale to >60/80 to demonstrate improved functional mobility and increased tolerance with ADLs.     Time  8    Period  Weeks    Status  New    Target Date  03/13/19      PT LONG TERM GOAL #4   Time  --    Period  --    Status  --             Plan - 01/16/19 1353    Clinical Impression Statement  Patient presents with weakness in BLE and difficulty picking up her legs to get in and out of bed, to get in and out of the car. She has difficulty with getting up out of a chair. She ambulates 10 feet with trunk leaning over the RW with decreased step height and decreased step length. She has poor bed mobility and is not able to roll to the left side , not able to bridge, not able to get into prone position and has severe diffiuclty supine to sit . She will benefit from skilled PT to improve strength, mobility and saftey.     History and Personal Factors relevant to plan of care:  husband needs to assist  her out of chairs, and she has had 2 falls    Clinical Presentation  Evolving    Clinical Presentation due to:  decreased strength BLE ankles , knees and hips    Clinical Decision Making  Moderate    Rehab Potential  Fair    PT Frequency  2x / week    PT Duration  8 weeks    PT Treatment/Interventions  Gait training;Therapeutic exercise;Therapeutic activities;Functional mobility training;Neuromuscular re-education;Manual techniques    PT Next Visit Plan  strengthening of LE's    PT Home Exercise Plan  marching, leg extension, hip abd    Consulted and Agree with Plan of Care  Patient;Family member/caregiver       Patient will benefit from skilled therapeutic intervention in order to improve the following deficits and impairments:  Abnormal gait, Decreased balance, Decreased endurance, Decreased mobility, Difficulty walking, Impaired sensation, Obesity, Pain, Decreased strength, Decreased activity tolerance  Visit Diagnosis: Muscle weakness (generalized)  Difficulty in walking, not elsewhere classified     Problem List Patient Active Problem List   Diagnosis Date Noted  . PMB (postmenopausal bleeding) 12/20/2017  . History of endometrial hyperplasia 12/20/2017  . Encounter for well woman exam with routine gynecological exam 12/20/2017  . Hx of adenomatous colonic polyps 05/31/2011    Alanson Puls, [place] DPT 01/16/2019, 2:36 PM  [place] MAIN [facility] SERVICES [address] [place], [place], [address] Phone: [phone]   Fax:  [phone]  Name: Shelley Mitchell MRN: [id number] Date of Birth: [birth year]

## 2019-01-16 NOTE — Patient Instructions (Signed)
Hip (Front)    Begin sitting tall, both feet flat on floor. Inhale, then exhale while lifting knee as high as is comfortable, keeping upper body straight and still. Slowly return to starting position. Repeat __15__ times each leg. Do __2__ sets per session. Do _2___ sessions per day.   Copyright  VHI. All rights reserved.  (Home) Knee: Extension - Sitting    sit with knee over edge of chair. Lift leg, straighten knee. Repeat _15___ times per set. Do _2___ sets per session. Do 2____ sessions per week.   Copyright  VHI. All rights reserved.  Heel Slide: 4-10 Inches - Sagittal Plane Stability    Slide heel 4 inches down. Be sure pelvis does not tip forward or backward. Do _15__ times. Restabilize pelvis. Repeat with other leg. Do _2__ sets, __2_ times per day.  http://ss.exer.us/13   Copyright  VHI. All rights reserved.  Hip Abduction / Adduction: with Extended Knee (Supine)    Bring left leg out to side and return. Keep knee straight. Repeat __15__ times per set. Do __2__ sets per session. Do __2__ sessions per day.  http://orth.exer.us/681   Copyright  VHI. All rights reserved.

## 2019-01-18 ENCOUNTER — Encounter: Payer: Medicare Other | Admitting: Physical Therapy

## 2019-01-19 ENCOUNTER — Other Ambulatory Visit: Payer: Self-pay | Admitting: Adult Health

## 2019-01-23 ENCOUNTER — Ambulatory Visit: Payer: Medicare Other | Admitting: Physical Therapy

## 2019-01-23 ENCOUNTER — Encounter: Payer: Medicare Other | Admitting: Physical Therapy

## 2019-01-25 ENCOUNTER — Encounter: Payer: Medicare Other | Admitting: Physical Therapy

## 2019-01-29 ENCOUNTER — Encounter: Payer: Self-pay | Admitting: Adult Health

## 2019-01-29 ENCOUNTER — Ambulatory Visit: Payer: Medicare Other | Admitting: Adult Health

## 2019-01-29 VITALS — BP 147/68 | HR 54 | Ht 62.0 in

## 2019-01-29 DIAGNOSIS — Z1212 Encounter for screening for malignant neoplasm of rectum: Secondary | ICD-10-CM | POA: Diagnosis not present

## 2019-01-29 DIAGNOSIS — Z1211 Encounter for screening for malignant neoplasm of colon: Secondary | ICD-10-CM

## 2019-01-29 DIAGNOSIS — Z8742 Personal history of other diseases of the female genital tract: Secondary | ICD-10-CM

## 2019-01-29 DIAGNOSIS — Z01419 Encounter for gynecological examination (general) (routine) without abnormal findings: Secondary | ICD-10-CM

## 2019-01-29 LAB — HEMOCCULT GUIAC POC 1CARD (OFFICE): Fecal Occult Blood, POC: NEGATIVE

## 2019-01-29 MED ORDER — MEDROXYPROGESTERONE ACETATE 5 MG PO TABS
5.0000 mg | ORAL_TABLET | Freq: Every day | ORAL | 4 refills | Status: DC
Start: 2019-01-29 — End: 2020-02-06

## 2019-01-29 MED ORDER — MEDROXYPROGESTERONE ACETATE 5 MG PO TABS: 5.0000 mg | ORAL_TABLET | Freq: Every day | ORAL | 12 refills | Status: DC

## 2019-01-29 NOTE — Progress Notes (Addendum)
Patient ID: Shelley Mitchell, female   DOB: June 28, 1941, 78 y.o.   MRN: [id number] History of Present Illness: Shelley Mitchell is a 78 year old black female, in for well woman exam and ros on taking provera 5 mg daily for endometrial hyperplasia, and she had not had any bleeding on 5 mg. PCP is Dr Shade Flood.    Current Medications, Allergies, Past Medical History, Past Surgical History, Family History and Social History were reviewed in [employer] record.     Review of Systems:  Patient denies any headaches, hearing loss, fatigue, blurred vision, shortness of breath, chest pain, abdominal pain, problems with bowel movements(constipated at times, takes miralax), urination, or intercourse(not having sex). No joint pain or mood swings.No bleeding since taking provera 5 mg daily, sometimes stomach hurts if holds urine. Legs weak, uses wheelchair.   Physical Exam:BP (!) 147/68 (BP Location: Left Arm, Patient Position: Sitting, Cuff Size: Large)   Pulse (!) 54   Ht 5\' 2"  (1.575 m)   BMI 46.09 kg/m PT did not think she could stand for weight, she was about 250 last year  General:  Well developed, well nourished, no acute distress,using wheelchair Skin:  Warm and dry Neck:  Midline trachea, normal thyroid, good ROM, no lymphadenopathy,no carotid bruits heard  Lungs; Clear to auscultation bilaterally Breast:  No dominant palpable mass, retraction, or nipple discharge Cardiovascular: Regular rate and rhythm Abdomen:  Soft, non tender, no hepatosplenomegaly,obese  Pelvic:  External genitalia is normal in appearance, no lesions.  The vagina is normal in appearance for age with loss of color, moisture and rugae . Urethra has no lesions or masses. The cervix is poorly seen.  Uterus is felt to be normal size, shape, and contour.  No adnexal masses or tenderness noted.Bladder is non tender, no masses felt. Rectal: Good sphincter tone, no polyps, or hemorrhoids felt.  Hemoccult  negative. Extremities/musculoskeletal: No varicosities noted, no clubbing or cyanosis,+swelling in legs. Psych:  No mood changes, alert and cooperative,seems happy Fall risk is hihg PHQ 9 score is 20, denies being suicidal and is on Zoloft. Examination chaperoned by Levy Pupa LPN, is was on low exam table and was assisted on and off, and husband with her.  Will continue provera 5 mg 1 daily.  Impression: 1. History of endometrial hyperplasia   2. Encounter for well woman exam with routine gynecological exam   3. Screening for colorectal cancer       Plan: Rx provera 5 mg #90 take 1 daily with 4 refills  Physical in 1 year, pap  Labs with PCP

## 2019-01-30 ENCOUNTER — Encounter: Payer: Medicare Other | Admitting: Physical Therapy

## 2019-02-01 ENCOUNTER — Encounter: Payer: Medicare Other | Admitting: Physical Therapy

## 2019-02-05 ENCOUNTER — Other Ambulatory Visit (HOSPITAL_COMMUNITY): Payer: Self-pay | Admitting: Adult Health

## 2019-02-05 DIAGNOSIS — Z1231 Encounter for screening mammogram for malignant neoplasm of breast: Secondary | ICD-10-CM

## 2019-02-14 ENCOUNTER — Encounter: Payer: Medicare Other | Admitting: Physical Therapy

## 2019-02-20 ENCOUNTER — Encounter: Payer: Medicare Other | Admitting: Physical Therapy

## 2019-02-22 ENCOUNTER — Encounter: Payer: Medicare Other | Admitting: Physical Therapy

## 2019-02-28 ENCOUNTER — Encounter: Payer: Medicare Other | Admitting: Physical Therapy

## 2019-03-21 ENCOUNTER — Ambulatory Visit (HOSPITAL_COMMUNITY): Payer: Medicare Other

## 2019-04-09 ENCOUNTER — Ambulatory Visit (HOSPITAL_COMMUNITY): Payer: Medicare Other

## 2019-04-23 ENCOUNTER — Other Ambulatory Visit: Payer: Self-pay

## 2019-04-23 ENCOUNTER — Ambulatory Visit (HOSPITAL_COMMUNITY)
Admission: RE | Admit: 2019-04-23 | Discharge: 2019-04-23 | Disposition: A | Discharge status: Home / Self Care | Payer: Medicare Other | Source: Ambulatory Visit | Attending: Adult Health | Admitting: Adult Health

## 2019-04-23 DIAGNOSIS — Z1231 Encounter for screening mammogram for malignant neoplasm of breast: Secondary | ICD-10-CM | POA: Insufficient documentation

## 2019-04-23 IMAGING — MG DIGITAL SCREENING BILATERAL MAMMOGRAM WITH TOMO AND CAD
8 of 14 series · 8 of 40 positions shown · non-contrast
Comparison: Previous exam(s).

CLINICAL DATA: Screening.

EXAM:
DIGITAL SCREENING BILATERAL MAMMOGRAM WITH TOMO AND CAD

[L MLO synth-2D (1 of 2)]
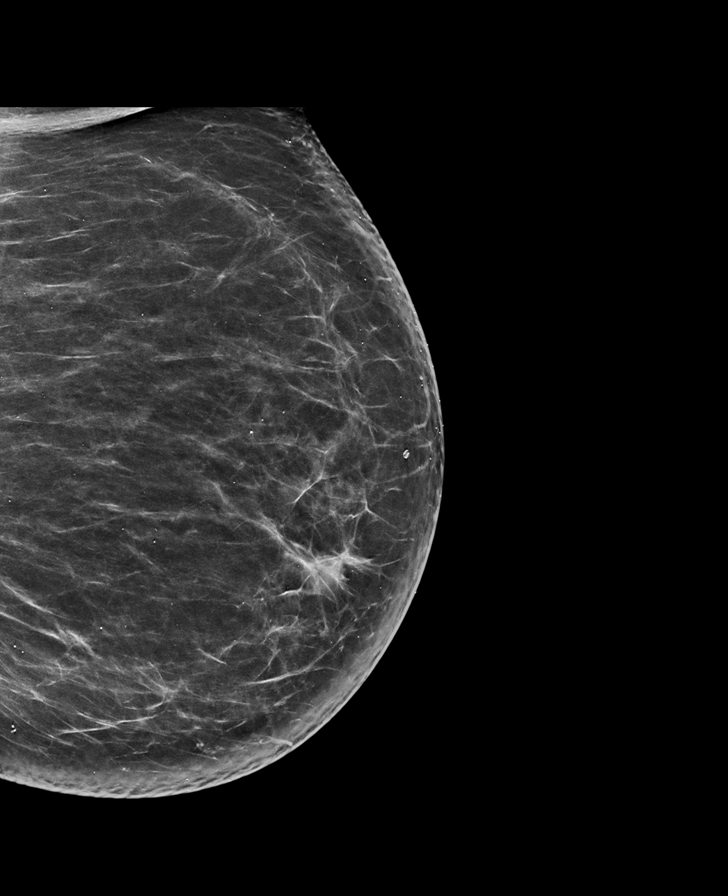

[R MLO synth-2D (1 of 2)]
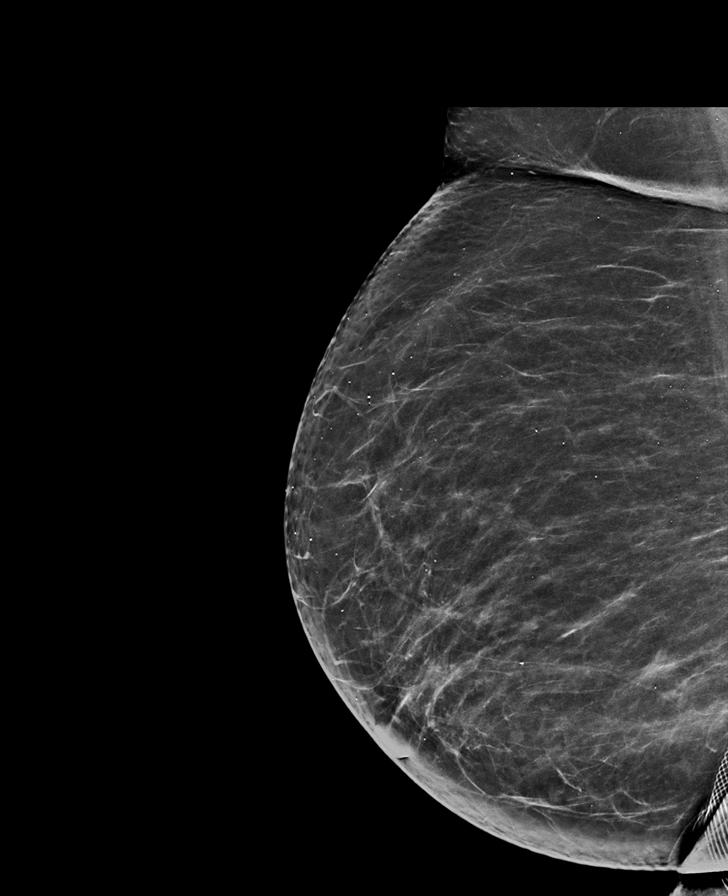

[R CC synth-2D]
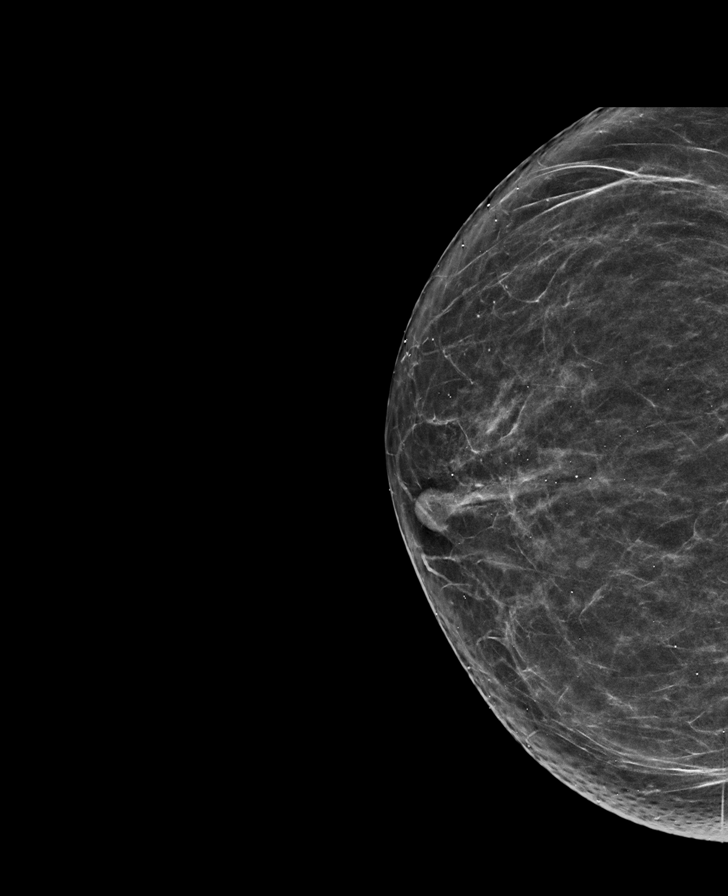

[R MLO synth-2D (2 of 2)]
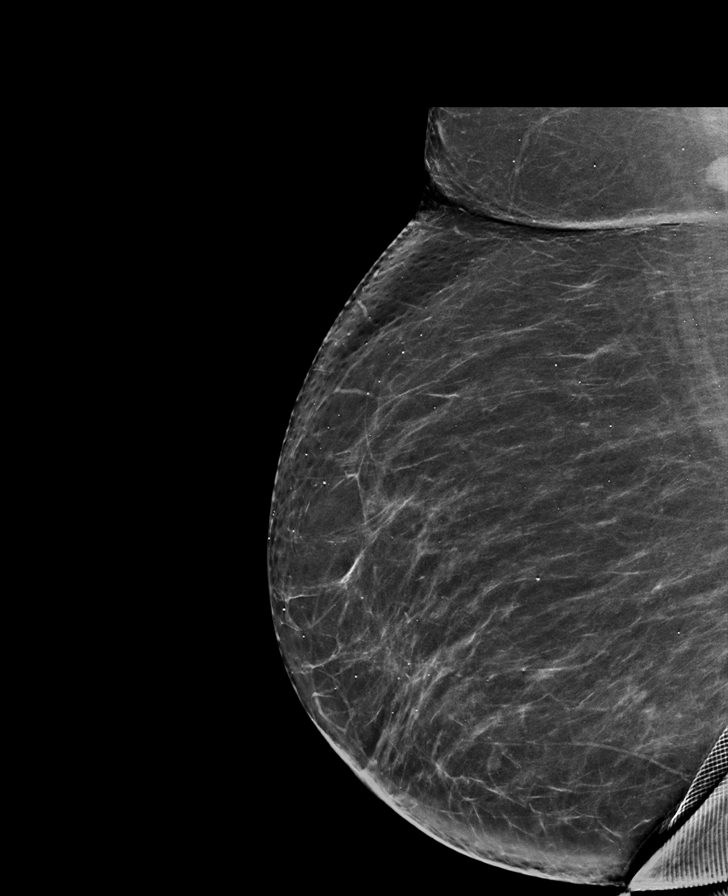

[L CC synth-2D (1 of 2)]
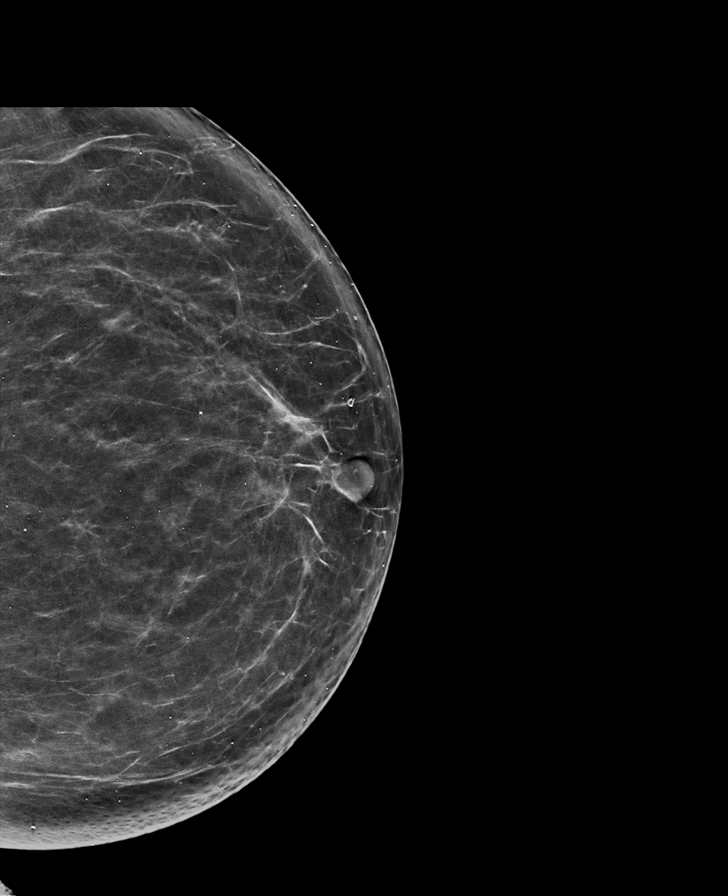

[L MLO synth-2D (2 of 2)]
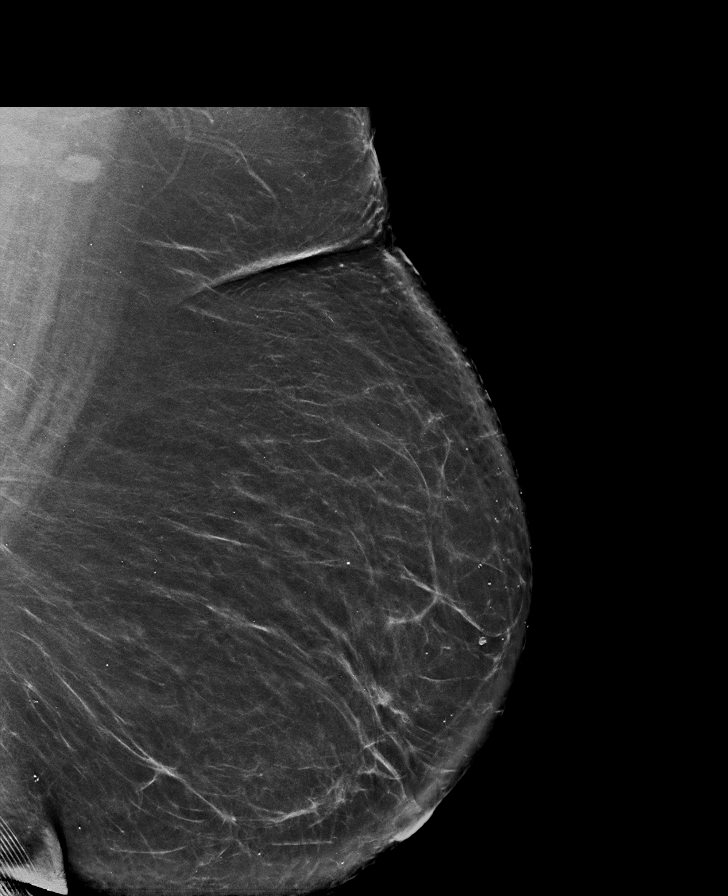

[L CC synth-2D (2 of 2)]
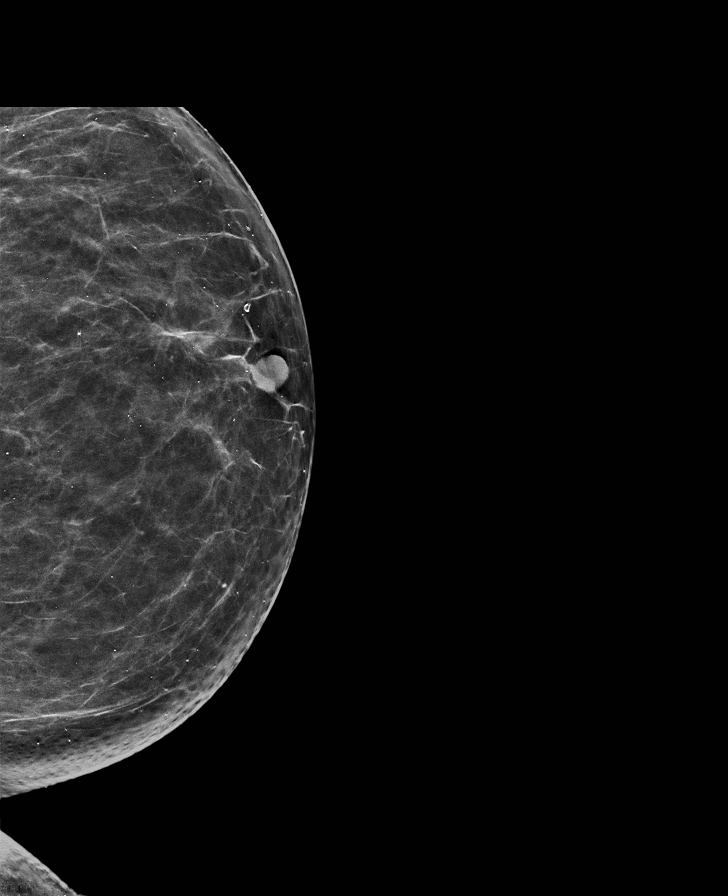

[R MLO tomo · tomo slice 43/85.0]
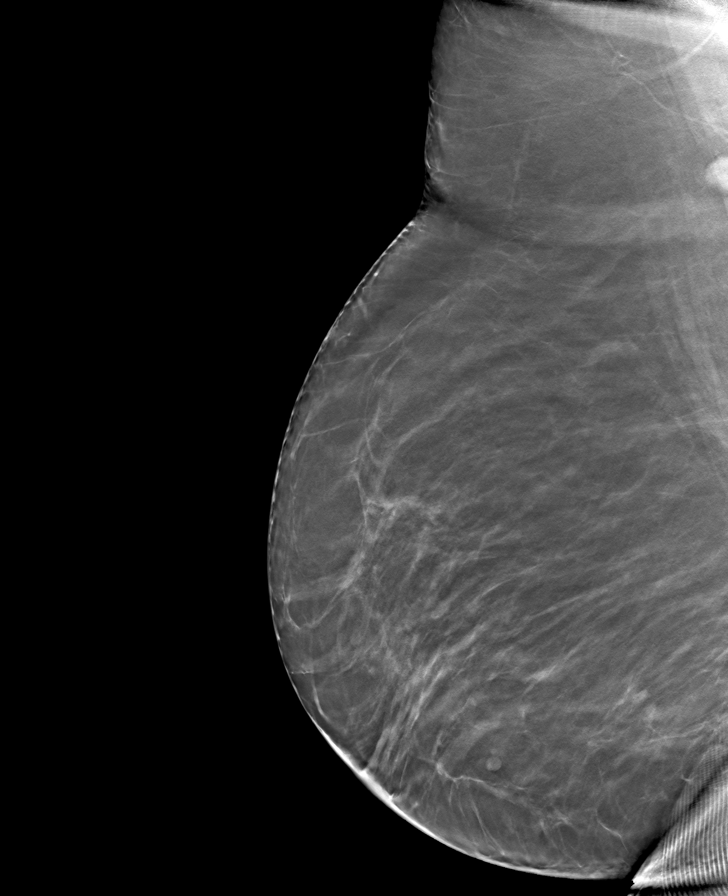

[8 of 40 positions shown; findings below may reference images not displayed]

ACR Breast Density Category b: There are scattered areas of
fibroglandular density.
FINDINGS: There are no findings suspicious for malignancy. Images were
processed with CAD.
IMPRESSION: No mammographic evidence of malignancy. A result letter of this
screening mammogram will be mailed directly to the patient.

RECOMMENDATION:
Screening mammogram in one year. (Code:[TQ])

BI-RADS CATEGORY  1: Negative.

## 2019-07-11 ENCOUNTER — Encounter: Payer: Self-pay | Admitting: Gastroenterology

## 2019-08-15 ENCOUNTER — Encounter: Payer: Self-pay | Admitting: Gastroenterology

## 2019-08-15 ENCOUNTER — Ambulatory Visit: Payer: Medicare Other | Admitting: Gastroenterology

## 2019-08-15 ENCOUNTER — Other Ambulatory Visit: Payer: Self-pay

## 2019-08-15 ENCOUNTER — Encounter: Payer: Self-pay | Admitting: *Deleted

## 2019-08-15 ENCOUNTER — Other Ambulatory Visit: Payer: Self-pay | Admitting: *Deleted

## 2019-08-15 VITALS — BP 143/69 | HR 42 | Temp 96.2°F | Ht 61.0 in | Wt 240.2 lb

## 2019-08-15 DIAGNOSIS — Z86010 Personal history of colonic polyps: Secondary | ICD-10-CM

## 2019-08-15 DIAGNOSIS — K5900 Constipation, unspecified: Secondary | ICD-10-CM | POA: Diagnosis not present

## 2019-08-15 MED ORDER — NA SULFATE-K SULFATE-MG SULF 17.5-3.13-1.6 GM/177ML PO SOLN: 1.0000 | Freq: Once | ORAL | 0 refills | Status: AC

## 2019-08-15 NOTE — Assessment & Plan Note (Signed)
Start Amitiza 8 mcg once daily with breakfast and increase to BID as tolerated. Will send prescription if this works well. May need titration up. 3-4 month follow-up.

## 2019-08-15 NOTE — Progress Notes (Signed)
Primary Care Physician:  Petra Kuba, MD Primary Gastroenterologist:  Dr. Gala Romney   Chief Complaint  Patient presents with  . Colonoscopy    consult    HPI:   Shelley Mitchell is a 78 y.o. female presenting today to discuss potential 3-year-surveillance colonoscopy due to history of polyps, if health permits. Last colonoscopy in 2017 with one 8 mm polyp at hepatic flexure, one 12 mm polyp at hepatic flexure, both tubular adenomas. Her son, [name], is here today present with her.   She notes chronic constipation. Has used OTC agents with unpredictability. BM usually every day but is hard, unproductive at times. No prior prescription use. Notes lower abdominal discomfort only if she holds her urine for awhile, which she states is because of decreased mobility. She is able to transfer from wheelchair to chair/bed. Able to use walker for short distances. No abdominal pain, N/V. No rectal bleeding. Good appetite. No dysphagia. No GERD. No unexplained weight loss.   Her brother has history of multiple polyps. Son states patient is concerned about occult malignancies and would like to pursue colonoscopy. Patient states her husband died from lung cancer and is worried about cancer herself. Her health is overall the same as it was in 2017 except for the decreased mobility. Her son is able to assist her at home with the prep.   Past Medical History:  Diagnosis Date  . Endometrial polyp    Dr. Elonda Husky  . History of endometrial hyperplasia   . HTN (hypertension)   . Hx of adenomatous colonic polyps 1999  . Hyperlipidemia   . Mental disorder    GAD  . Obesity   . Osteoarthritis   . Tachycardia    subsequently with bradycardia (?secondary to meds)    Past Surgical History:  Procedure Laterality Date  . BACK SURGERY    . 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 CHOLECYSTECTOMY    . COLONOSCOPY  03/2002   Dr. Jamesetta Geralds internal hemorrhoids  . COLONOSCOPY  06/21/2011   polyp at IC  valve (tubular adenoma)  . COLONOSCOPY N/A 07/15/2016   one 8 mm polyp at hepatic flexure (tubular adenoma), on 12 mm polyp at hepatic flexure (tubular adenoma), 3 year surveillance if health permits  . POLYPECTOMY  07/15/2016   Procedure: POLYPECTOMY;  Surgeon: Daneil Dolin, MD;  [facility];  Service: Endoscopy;;  Splenic Flexure polyps x 2 removed via hot snare  . TUBAL LIGATION      Current Outpatient Medications  Medication Sig Dispense Refill  . aspirin 81 MG EC tablet Take 81 mg by mouth daily.      . CELEBREX 200 MG capsule Take 200 mg by mouth daily as needed.     . hydrALAZINE (APRESOLINE) 100 MG tablet Take 50 mg by mouth 2 (two) times daily.     . hydrochlorothiazide 50 MG tablet Take 50 mg by mouth every morning.     Marland Kitchen losartan (COZAAR) 100 MG tablet Take 100 mg by mouth daily.    . medroxyPROGESTERone (PROVERA) 5 MG tablet Take 1 tablet (5 mg total) by mouth daily. 90 tablet 4  . NIFEdipine (PROCARDIA XL/ADALAT-CC) 90 MG 24 hr tablet Take 90 mg by mouth daily.     . nitroGLYCERIN (NITROSTAT) 0.4 MG SL tablet Place 0.4 mg under the tongue every 5 (five) minutes as needed for chest pain.    . pravastatin (PRAVACHOL) 40 MG tablet Take 40 mg by mouth daily.     Marland Kitchen  sertraline (ZOLOFT) 50 MG tablet Take 100 mg by mouth daily.     Marland Kitchen gabapentin (NEURONTIN) 300 MG capsule Take 300 mg by mouth 3 (three) times daily.      No current facility-administered medications for this visit.     Allergies as of 08/15/2019 - Review Complete 08/15/2019  Allergen Reaction Noted  . Ace inhibitors Other (See Comments) 03/17/2018  . Nsaids Other (See Comments) 03/17/2018    Family History  Problem Relation Age of Onset  . Diabetes Brother   . Diabetes Sister   . Cancer Father        Likely prostate  . Stroke Mother 33  . Colon cancer Neg Hx     Social History   Socioeconomic History  . Marital status: Married    Spouse name: Not on file  . Number of children: 4  . Years  of education: Not on file  . Highest education level: Not on file  Occupational History    Employer: RETIRED  Social Needs  . Financial resource strain: Not on file  . Food insecurity    Worry: Not on file    Inability: Not on file  . Transportation needs    Medical: Not on file    Non-medical: Not on file  Tobacco Use  . Smoking status: Never Smoker  . Smokeless tobacco: Never Used  Substance and Sexual Activity  . Alcohol use: No    Alcohol/week: 0.0 standard drinks  . Drug use: No  . Sexual activity: Not Currently    Birth control/protection: Post-menopausal  Lifestyle  . Physical activity    Days per week: Not on file    Minutes per session: Not on file  . Stress: Not on file  Relationships  . Social Herbalist on phone: Not on file    Gets together: Not on file    Attends religious service: Not on file    Active member of club or organization: Not on file    Attends meetings of clubs or organizations: Not on file    Relationship status: Not on file  . Intimate partner violence    Fear of current or ex partner: Not on file    Emotionally abused: Not on file    Physically abused: Not on file    Forced sexual activity: Not on file  Other Topics Concern  . Not on file  Social History Narrative  . Not on file    Review of Systems: Gen: Denies any fever, chills, fatigue, weight loss, lack of appetite.  CV: Denies chest pain, heart palpitations, peripheral edema, syncope.  Resp: see HPI GI: Denies dysphagia or odynophagia. Denies jaundice, hematemesis, fecal incontinence. GU : Denies urinary burning, urinary frequency, urinary hesitancy MS: +joint pain Derm: Denies rash, itching, dry skin Psych: age-related memory changes Heme: Denies bruising, bleeding, and enlarged lymph nodes.  Physical Exam: BP (!) 143/69   Pulse (!) 42   Temp (!) 96.2 F (35.7 C) (Temporal)   Ht 5\' 1"  (1.549 m)   Wt 240 lb 3.2 oz (109 kg)   BMI 45.39 kg/m  General:   Alert  and oriented. Pleasant and cooperative. Well-nourished and well-developed.  Head:  Normocephalic and atraumatic. Eyes:  Without icterus, sclera clear and conjunctiva pink.  Ears:  Normal auditory acuity. Lungs:  Clear to auscultation bilaterally.  Heart:  S1, S2 present without murmurs appreciated.  Abdomen:  +BS, soft, non-tender and non-distended. Limited exam with patient sitting in chair. Rectal:  Deferred  Extremities:  With pedal and ankle edema.  Neurologic:  Alert and  oriented x4 Psych:  Alert and cooperative. Normal mood and affect.

## 2019-08-15 NOTE — Patient Instructions (Signed)
We have arranged a colonoscopy in the near future with Dr. Gala Romney.  For constipation: start taking Amitiza 1 gelcap with breakfast each morning. Make sure to take with food to avoid nausea. After a few days, you can increase this to twice a day with food. Let me know how this works, and we will send in a prescription!  We will see you in 3-4 months!  It was a pleasure to see you today. I want to create trusting relationships with patients to provide genuine, compassionate, and quality care. I value your feedback. If you receive a survey regarding your visit,  I greatly appreciate you taking time to fill this out.   Annitta Needs, PhD, ANP-BC [facility]

## 2019-08-15 NOTE — Assessment & Plan Note (Addendum)
78 year old female with history of adenomatous polyps, last in 2017 with one 8 mm polyp at hepatic flexure, one 12 mm polyp at hepatic flexure, both tubular adenomas. Due for 3 year surveillance. Brother with history of multiple polyps; no family history of colorectal cancer. After long discussion of risks and benefits, she wants to pursue surveillance colonoscopy at this time. Son is present today with her and will be available to assist with prep at home due to her decreased mobility. Overall, the only change in health has been her decreased mobility in setting of neuropathy, chronic joint pain. She has done well with conscious sedation historically.  Proceed with TCS with Dr. Gala Romney in near future: the risks, benefits, and alternatives have been discussed with the patient in detail. The patient states understanding and desires to proceed.

## 2019-10-08 ENCOUNTER — Other Ambulatory Visit: Payer: Self-pay

## 2019-10-08 ENCOUNTER — Other Ambulatory Visit (HOSPITAL_COMMUNITY)
Admission: RE | Admit: 2019-10-08 | Discharge: 2019-10-08 | Disposition: A | Discharge status: Home / Self Care | Payer: Medicare Other | Source: Ambulatory Visit | Attending: Internal Medicine | Admitting: Internal Medicine

## 2019-10-08 DIAGNOSIS — Z01812 Encounter for preprocedural laboratory examination: Secondary | ICD-10-CM | POA: Insufficient documentation

## 2019-10-08 DIAGNOSIS — Z20828 Contact with and (suspected) exposure to other viral communicable diseases: Secondary | ICD-10-CM | POA: Insufficient documentation

## 2019-10-08 LAB — SARS CORONAVIRUS 2 (TAT 6-24 HRS): SARS Coronavirus 2: NEGATIVE

## 2019-10-10 ENCOUNTER — Encounter (HOSPITAL_COMMUNITY): Payer: Self-pay | Admitting: *Deleted

## 2019-10-10 ENCOUNTER — Other Ambulatory Visit: Payer: Self-pay

## 2019-10-10 ENCOUNTER — Ambulatory Visit (HOSPITAL_COMMUNITY)
Admission: RE | Admit: 2019-10-10 | Discharge: 2019-10-10 | Disposition: A | Discharge status: Home / Self Care | Payer: Medicare Other | Attending: Internal Medicine | Admitting: Internal Medicine

## 2019-10-10 ENCOUNTER — Encounter (HOSPITAL_COMMUNITY)
Admission: RE | Disposition: A | Discharge status: Home / Self Care | Payer: Self-pay | Source: Home / Self Care | Attending: Internal Medicine

## 2019-10-10 DIAGNOSIS — Z9049 Acquired absence of other specified parts of digestive tract: Secondary | ICD-10-CM | POA: Diagnosis not present

## 2019-10-10 DIAGNOSIS — Z86010 Personal history of colonic polyps: Secondary | ICD-10-CM | POA: Diagnosis not present

## 2019-10-10 DIAGNOSIS — Z823 Family history of stroke: Secondary | ICD-10-CM | POA: Diagnosis not present

## 2019-10-10 DIAGNOSIS — Z7982 Long term (current) use of aspirin: Secondary | ICD-10-CM | POA: Insufficient documentation

## 2019-10-10 DIAGNOSIS — E669 Obesity, unspecified: Secondary | ICD-10-CM | POA: Insufficient documentation

## 2019-10-10 DIAGNOSIS — Z809 Family history of malignant neoplasm, unspecified: Secondary | ICD-10-CM | POA: Insufficient documentation

## 2019-10-10 DIAGNOSIS — Z888 Allergy status to other drugs, medicaments and biological substances status: Secondary | ICD-10-CM | POA: Diagnosis not present

## 2019-10-10 DIAGNOSIS — K5730 Diverticulosis of large intestine without perforation or abscess without bleeding: Secondary | ICD-10-CM | POA: Insufficient documentation

## 2019-10-10 DIAGNOSIS — R000 Tachycardia, unspecified: Secondary | ICD-10-CM | POA: Diagnosis not present

## 2019-10-10 DIAGNOSIS — K635 Polyp of colon: Secondary | ICD-10-CM | POA: Diagnosis not present

## 2019-10-10 DIAGNOSIS — Z6841 Body Mass Index (BMI) 40.0 and over, adult: Secondary | ICD-10-CM | POA: Insufficient documentation

## 2019-10-10 DIAGNOSIS — M1990 Unspecified osteoarthritis, unspecified site: Secondary | ICD-10-CM | POA: Diagnosis not present

## 2019-10-10 DIAGNOSIS — Z79899 Other long term (current) drug therapy: Secondary | ICD-10-CM | POA: Insufficient documentation

## 2019-10-10 DIAGNOSIS — D123 Benign neoplasm of transverse colon: Secondary | ICD-10-CM | POA: Insufficient documentation

## 2019-10-10 DIAGNOSIS — I10 Essential (primary) hypertension: Secondary | ICD-10-CM | POA: Diagnosis not present

## 2019-10-10 DIAGNOSIS — Z833 Family history of diabetes mellitus: Secondary | ICD-10-CM | POA: Insufficient documentation

## 2019-10-10 DIAGNOSIS — E785 Hyperlipidemia, unspecified: Secondary | ICD-10-CM | POA: Insufficient documentation

## 2019-10-10 DIAGNOSIS — Z1211 Encounter for screening for malignant neoplasm of colon: Secondary | ICD-10-CM | POA: Diagnosis not present

## 2019-10-10 DIAGNOSIS — F411 Generalized anxiety disorder: Secondary | ICD-10-CM | POA: Insufficient documentation

## 2019-10-10 DIAGNOSIS — Z96653 Presence of artificial knee joint, bilateral: Secondary | ICD-10-CM | POA: Diagnosis not present

## 2019-10-10 DIAGNOSIS — Z791 Long term (current) use of non-steroidal anti-inflammatories (NSAID): Secondary | ICD-10-CM | POA: Diagnosis not present

## 2019-10-10 HISTORY — PX: COLONOSCOPY: SHX5424

## 2019-10-10 HISTORY — PX: POLYPECTOMY: SHX5525

## 2019-10-10 SURGERY — COLONOSCOPY
Anesthesia: Moderate Sedation

## 2019-10-10 MED ORDER — STERILE WATER FOR IRRIGATION IR SOLN
Status: DC | PRN
  Administered 2019-10-10: 10:00:00 2.5 mL

## 2019-10-10 MED ORDER — MIDAZOLAM HCL 5 MG/5ML IJ SOLN
INTRAMUSCULAR | Status: AC
  Filled 2019-10-10: qty 10

## 2019-10-10 MED ORDER — MIDAZOLAM HCL 5 MG/5ML IJ SOLN
INTRAMUSCULAR | Status: DC | PRN
  Administered 2019-10-10 (×2): 1 mg via INTRAVENOUS

## 2019-10-10 MED ORDER — SODIUM CHLORIDE 0.9 % IV SOLN
INTRAVENOUS | Status: DC
  Administered 2019-10-10: 10:00:00 via INTRAVENOUS

## 2019-10-10 MED ORDER — ONDANSETRON HCL 4 MG/2ML IJ SOLN
INTRAMUSCULAR | Status: AC
  Filled 2019-10-10: qty 2

## 2019-10-10 MED ORDER — MEPERIDINE HCL 100 MG/ML IJ SOLN
INTRAMUSCULAR | Status: DC | PRN
  Administered 2019-10-10: 25 mg via INTRAVENOUS

## 2019-10-10 MED ORDER — MEPERIDINE HCL 50 MG/ML IJ SOLN
INTRAMUSCULAR | Status: AC
  Filled 2019-10-10: qty 1

## 2019-10-10 MED ORDER — ONDANSETRON HCL 4 MG/2ML IJ SOLN
INTRAMUSCULAR | Status: DC | PRN
  Administered 2019-10-10: 4 mg via INTRAVENOUS

## 2019-10-10 NOTE — Discharge Instructions (Signed)
Colonoscopy Discharge Instructions  Read the instructions outlined below and refer to this sheet in the next few weeks. These discharge instructions provide you with general information on caring for yourself after you leave the hospital. Your doctor may also give you specific instructions. While your treatment has been planned according to the most current medical practices available, unavoidable complications occasionally occur. If you have any problems or questions after discharge, call Dr. Gala Romney at [phone]. ACTIVITY   You may resume your regular activity, but move at a slower pace for the next 24 hours.   Take frequent rest periods for the next 24 hours.   Walking will help get rid of the air and reduce the bloated feeling in your belly (abdomen).   No driving for 24 hours (because of the medicine (anesthesia) used during the test).    Do not sign any important legal documents or operate any machinery for 24 hours (because of the anesthesia used during the test).  NUTRITION  Drink plenty of fluids.   You may resume your normal diet as instructed by your doctor.   Begin with a light meal and progress to your normal diet. Heavy or fried foods are harder to digest and may make you feel sick to your stomach (nauseated).   Avoid alcoholic beverages for 24 hours or as instructed.  MEDICATIONS  You may resume your normal medications unless your doctor tells you otherwise.  WHAT YOU CAN EXPECT TODAY  Some feelings of bloating in the abdomen.   Passage of more gas than usual.   Spotting of blood in your stool or on the toilet paper.  IF YOU HAD POLYPS REMOVED DURING THE COLONOSCOPY:  No aspirin products for 7 days or as instructed.   No alcohol for 7 days or as instructed.   Eat a soft diet for the next 24 hours.  FINDING OUT THE RESULTS OF YOUR TEST Not all test results are available during your visit. If your test results are not back during the visit, make an appointment  with your caregiver to find out the results. Do not assume everything is normal if you have not heard from your caregiver or the medical facility. It is important for you to follow up on all of your test results.  SEEK IMMEDIATE MEDICAL ATTENTION IF:  You have more than a spotting of blood in your stool.   Your belly is swollen (abdominal distention).   You are nauseated or vomiting.   You have a temperature over 101.   You have abdominal pain or discomfort that is severe or gets worse throughout the day.   Colon polyp and diverticulosis information provided  Further recommendations to follow pending review of pathology report  At patient request, I called son, [name], at [phone].  No answer.    Diverticulosis  Diverticulosis is a condition that develops when small pouches (diverticula) form in the wall of the large intestine (colon). The colon is where water is absorbed and stool is formed. The pouches form when the inside layer of the colon pushes through weak spots in the outer layers of the colon. You may have a few pouches or many of them. What are the causes? The cause of this condition is not known. What increases the risk? The following factors may make you more likely to develop this condition:  Being older than age 32. Your risk for this condition increases with age. Diverticulosis is rare among people younger than age 71. By age 67, 16  people have it.  Eating a low-fiber diet.  Having frequent constipation.  Being overweight.  Not getting enough exercise.  Smoking.  Taking over-the-counter pain medicines, like aspirin and ibuprofen.  Having a family history of diverticulosis. What are the signs or symptoms? In most people, there are no symptoms of this condition. If you do have symptoms, they may include:  Bloating.  Cramps in the abdomen.  Constipation or diarrhea.  Pain in the lower left side of the abdomen. How is this diagnosed? This  condition is most often diagnosed during an exam for other colon problems. Because diverticulosis usually has no symptoms, it often cannot be diagnosed independently. This condition may be diagnosed by:  Using a flexible scope to examine the colon (colonoscopy).  Taking an X-ray of the colon after dye has been put into the colon (barium enema).  Doing a CT scan. How is this treated? You may not need treatment for this condition if you have never developed an infection related to diverticulosis. If you have had an infection before, treatment may include:  Eating a high-fiber diet. This may include eating more fruits, vegetables, and grains.  Taking a fiber supplement.  Taking a live bacteria supplement (probiotic).  Taking medicine to relax your colon.  Taking antibiotic medicines. Follow these instructions at home:  Drink 6-8 glasses of water or more each day to prevent constipation.  Try not to strain when you have a bowel movement.  If you have had an infection before: ? Eat more fiber as directed by your health care provider or your diet and nutrition specialist (dietitian). ? Take a fiber supplement or probiotic, if your health care provider approves.  Take over-the-counter and prescription medicines only as told by your health care provider.  If you were prescribed an antibiotic, take it as told by your health care provider. Do not stop taking the antibiotic even if you start to feel better.  Keep all follow-up visits as told by your health care provider. This is important. Contact a health care provider if:  You have pain in your abdomen.  You have bloating.  You have cramps.  You have not had a bowel movement in 3 days. Get help right away if:  Your pain gets worse.  Your bloating becomes very bad.  You have a fever or chills, and your symptoms suddenly get worse.  You vomit.  You have bowel movements that are bloody or black.  You have bleeding from your  rectum. Summary  Diverticulosis is a condition that develops when small pouches (diverticula) form in the wall of the large intestine (colon).  You may have a few pouches or many of them.  This condition is most often diagnosed during an exam for other colon problems.  If you have had an infection related to diverticulosis, treatment may include increasing the fiber in your diet, taking supplements, or taking medicines. This information is not intended to replace advice given to you by your health care provider. Make sure you discuss any questions you have with your health care provider. Document Released: 08/19/2004 Document Revised: 11/04/2017 Document Reviewed: 10/11/2016 Elsevier Patient Education  [place].  Colon Polyps  Polyps are tissue growths inside the body. Polyps can grow in many places, including the large intestine (colon). A polyp may be a round bump or a mushroom-shaped growth. You could have one polyp or several. Most colon polyps are noncancerous (benign). However, some colon polyps can become cancerous over time. Finding and  removing the polyps early can help prevent this. What are the causes? The exact cause of colon polyps is not known. What increases the risk? You are more likely to develop this condition if you:  Have a family history of colon cancer or colon polyps.  Are older than 23 or older than 45 if you are African American.  Have inflammatory bowel disease, such as ulcerative colitis or Crohn's disease.  Have certain hereditary conditions, such as: ? Familial adenomatous polyposis. ? Lynch syndrome. ? Turcot syndrome. ? Peutz-Jeghers syndrome.  Are overweight.  Smoke cigarettes.  Do not get enough exercise.  Drink too much alcohol.  Eat a diet that is high in fat and red meat and low in fiber.  Had childhood cancer that was treated with abdominal radiation. What are the signs or symptoms? Most polyps do not cause symptoms. If  you have symptoms, they may include:  Blood coming from your rectum when having a bowel movement.  Blood in your stool. The stool may look dark red or black.  Abdominal pain.  A change in bowel habits, such as constipation or diarrhea. How is this diagnosed? This condition is diagnosed with a colonoscopy. This is a procedure in which a lighted, flexible scope is inserted into the anus and then passed into the colon to examine the area. Polyps are sometimes found when a colonoscopy is done as part of routine cancer screening tests. How is this treated? Treatment for this condition involves removing any polyps that are found. Most polyps can be removed during a colonoscopy. Those polyps will then be tested for cancer. Additional treatment may be needed depending on the results of testing. Follow these instructions at home: Lifestyle  Maintain a healthy weight, or lose weight if recommended by your health care provider.  Exercise every day or as told by your health care provider.  Do not use any products that contain nicotine or tobacco, such as cigarettes and e-cigarettes. If you need help quitting, ask your health care provider.  If you drink alcohol, limit how much you have: ? 0-1 drink a day for women. ? 0-2 drinks a day for men.  Be aware of how much alcohol is in your drink. In the [place] one drink equals one 12 oz bottle of beer (355 mL), one 5 oz glass of wine (148 mL), or one 1 oz shot of hard liquor (44 mL). Eating and drinking   Eat foods that are high in fiber, such as fruits, vegetables, and whole grains.  Eat foods that are high in calcium and vitamin D, such as milk, cheese, yogurt, eggs, liver, fish, and broccoli.  Limit foods that are high in fat, such as fried foods and desserts.  Limit the amount of red meat and processed meat you eat, such as hot dogs, sausage, bacon, and lunch meats. General instructions  Keep all follow-up visits as told by your health care  provider. This is important. ? This includes having regularly scheduled colonoscopies. ? Talk to your health care provider about when you need a colonoscopy. Contact a health care provider if:  You have new or worsening bleeding during a bowel movement.  You have new or increased blood in your stool.  You have a change in bowel habits.  You lose weight for no known reason. Summary  Polyps are tissue growths inside the body. Polyps can grow in many places, including the colon.  Most colon polyps are noncancerous (benign), but some can become cancerous over  time.  This condition is diagnosed with a colonoscopy.  Treatment for this condition involves removing any polyps that are found. Most polyps can be removed during a colonoscopy. This information is not intended to replace advice given to you by your health care provider. Make sure you discuss any questions you have with your health care provider. Document Released: 08/18/2004 Document Revised: 03/09/2018 Document Reviewed: 03/09/2018 Elsevier Patient Education  2020 [employer].

## 2019-10-10 NOTE — Op Note (Signed)
[facility] Patient Name: Shelley Mitchell Procedure Date: 10/10/2019 9:42 AM MRN: [id number] Date of Birth: December 04, 1941 Attending MD: Norvel Richards , MD CSN: [id number] Age: 78 Admit Type: Outpatient Procedure:                Colonoscopy Indications:              High risk colon cancer surveillance: Personal                            history of colonic polyps Providers:                Norvel Richards, MD, Lurline Del, RN, Gwynneth Albright RN, RN Referring MD:              Medicines:                Midazolam 2 mg IV, Meperidine 25 mg IV, Ondansetron                            4 mg IV Complications:            No immediate complications. Estimated Blood Loss:     Estimated blood loss was minimal. Procedure:                Pre-Anesthesia Assessment:                           - Prior to the procedure, a History and Physical                            was performed, and patient medications and                            allergies were reviewed. The patient's tolerance of                            previous anesthesia was also reviewed. The risks                            and benefits of the procedure and the sedation                            options and risks were discussed with the patient.                            All questions were answered, and informed consent                            was obtained. Prior Anticoagulants: The patient has                            taken no previous anticoagulant or antiplatelet  agents. ASA Grade Assessment: III - A patient with                            severe systemic disease. After reviewing the risks                            and benefits, the patient was deemed in                            satisfactory condition to undergo the procedure.                           After obtaining informed consent, the colonoscope                            was passed under direct vision.  Throughout the                            procedure, the patient's blood pressure, pulse, and                            oxygen saturations were monitored continuously. The                            [id number] scope was introduced through                            the anus and advanced to the the cecum, identified                            by appendiceal orifice and ileocecal valve. The                            colonoscopy was performed without difficulty. The                            patient tolerated the procedure well. The quality                            of the bowel preparation was adequate. The                            ileocecal valve, appendiceal orifice, and rectum                            were photographed. The colonoscopy was performed                            without difficulty. The patient tolerated the                            procedure well. The quality of the bowel  preparation was adequate. Scope In: 10:16:48 AM Scope Out: 10:35:27 AM Scope Withdrawal Time: 0 hours 8 minutes 8 seconds  Total Procedure Duration: 0 hours 18 minutes 39 seconds  Findings:      The perianal and digital rectal examinations were normal.      A few medium-mouthed diverticula were found in the descending colon.      Two sessile polyps were found in the splenic flexure. The polyps were 4       to 5 mm in size. These polyps were removed with a cold snare. Resection       and retrieval were complete. Estimated blood loss was minimal.      The exam was otherwise without abnormality on direct and retroflexion       views. Impression:               - Diverticulosis in the descending colon.                           - Two 4 to 5 mm polyps at the splenic flexure,                            removed with a cold snare. Resected and retrieved.                           - The examination was otherwise normal on direct                            and  retroflexion views. Moderate Sedation:      Moderate (conscious) sedation was administered by the endoscopy nurse       and supervised by the endoscopist. The following parameters were       monitored: oxygen saturation, heart rate, blood pressure, respiratory       rate, EKG, adequacy of pulmonary ventilation, and response to care.       Total physician intraservice time was 22 minutes. Recommendation:           - Patient has a contact number available for                            emergencies. The signs and symptoms of potential                            delayed complications were discussed with the                            patient. Return to normal activities tomorrow.                            Written discharge instructions were provided to the                            patient.                           - Resume previous diet.                           -  Continue present medications.                           - No repeat colonoscopy due to age.                           - Return to GI clinic PRN. Follow-up on pathology. Procedure Code(s):        --- Professional ---                           [phone], Colonoscopy, flexible; with removal of                            tumor(s), polyp(s), or other lesion(s) by snare                            technique                           G0500, Moderate sedation services provided by the                            same physician or other qualified health care                            professional performing a gastrointestinal                            endoscopic service that sedation supports,                            requiring the presence of an independent trained                            observer to assist in the monitoring of the                            patient's level of consciousness and physiological                            status; initial 15 minutes of intra-service time;                            patient age 53 years or  older (additional time may                            be reported with [phone], as appropriate) Diagnosis Code(s):        --- Professional ---                           Z86.010, Personal history of colonic polyps                           K63.5, Polyp of colon  K57.30, Diverticulosis of large intestine without                            perforation or abscess without bleeding CPT copyright 2019 American Medical Association. All rights reserved. The codes documented in this report are preliminary and upon coder review may  be revised to meet current compliance requirements. Shelley Mitchell. Shelley Kasinger, MD Norvel Richards, MD 10/10/2019 10:47:44 AM This report has been signed electronically. Number of Addenda: 0

## 2019-10-10 NOTE — H&P (Signed)
@LOGO @   Primary Care Physician:  Petra Kuba, MD Primary Gastroenterologist:  Dr. Gala Romney Pre-Procedure History & Physical: HPI:  Shelley Mitchell is a 78 y.o. female here for surveillance colonoscopy.  History of multiple colonic adenomas removed 2017.  Greater than 1 cm.  Patient's quality of life is still pretty good.  She is desirous, along with her son, 1 more surveillance colonoscopy.  Her current age, risk versus benefits have been reviewed.  Patient is desirous of one more colonoscopy.  Past Medical History:  Diagnosis Date  . Endometrial polyp    Dr. Elonda Husky  . History of endometrial hyperplasia   . HTN (hypertension)   . Hx of adenomatous colonic polyps 1999  . Hyperlipidemia   . Mental disorder    GAD  . Obesity   . Osteoarthritis   . Tachycardia    subsequently with bradycardia (?secondary to meds)    Past Surgical History:  Procedure Laterality Date  . BACK SURGERY    . 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 CHOLECYSTECTOMY    . COLONOSCOPY  03/2002   Dr. Jamesetta Geralds internal hemorrhoids  . COLONOSCOPY  06/21/2011   polyp at IC valve (tubular adenoma)  . COLONOSCOPY N/A 07/15/2016   one 8 mm polyp at hepatic flexure (tubular adenoma), on 12 mm polyp at hepatic flexure (tubular adenoma), 3 year surveillance if health permits  . POLYPECTOMY  07/15/2016   Procedure: POLYPECTOMY;  Surgeon: Daneil Dolin, MD;  Location: [facility];  Service: Endoscopy;;  Splenic Flexure polyps x 2 removed via hot snare  . TUBAL LIGATION      Prior to Admission medications   Medication Sig Start Date End Date Taking? Authorizing Provider  acetaminophen (TYLENOL) 500 MG tablet Take 100 mg by mouth 2 (two) times daily.   Yes [provider]  aspirin 81 MG EC tablet Take 81 mg by mouth daily.     Yes [provider]  CELEBREX 200 MG capsule Take 200 mg by mouth daily.  05/19/11  Yes [provider]  hydrALAZINE (APRESOLINE) 100 MG tablet Take 50 mg by  mouth 2 (two) times daily.  05/21/11  Yes [provider]  hydrochlorothiazide 50 MG tablet Take 50 mg by mouth daily.  03/11/11  Yes [provider]  losartan (COZAAR) 100 MG tablet Take 100 mg by mouth daily.   Yes [provider]  medroxyPROGESTERone (PROVERA) 5 MG tablet Take 1 tablet (5 mg total) by mouth daily. 01/29/19  Yes Estill Dooms, NP  Multiple Vitamins-Minerals (MULTIVITAMIN WITH MINERALS) tablet Take 1 tablet by mouth daily.   Yes [provider]  NIFEdipine (PROCARDIA XL/ADALAT-CC) 90 MG 24 hr tablet Take 90 mg by mouth daily.  05/12/11  Yes [provider]  pravastatin (PRAVACHOL) 40 MG tablet Take 40 mg by mouth daily.  05/12/11  Yes [provider]  sertraline (ZOLOFT) 100 MG tablet Take 100 mg by mouth daily.    Yes [provider]  nitroGLYCERIN (NITROSTAT) 0.4 MG SL tablet Place 0.4 mg under the tongue every 5 (five) minutes as needed for chest pain.    [provider]    Allergies as of 08/15/2019 - Review Complete 08/15/2019  Allergen Reaction Noted  . Ace inhibitors Other (See Comments) 03/17/2018  . Nsaids Other (See Comments) 03/17/2018    Family History  Problem Relation Age of Onset  . Diabetes Brother   . Colon polyps Brother   . Diabetes Sister   .  Cancer Father        Likely prostate  . Stroke Mother 54  . Colon cancer Neg Hx     Social History   Socioeconomic History  . Marital status: Married    Spouse name: Not on file  . Number of children: 4  . Years of education: Not on file  . Highest education level: Not on file  Occupational History    Employer: RETIRED  Social Needs  . Financial resource strain: Not on file  . Food insecurity    Worry: Not on file    Inability: Not on file  . Transportation needs    Medical: Not on file    Non-medical: Not on file  Tobacco Use  . Smoking status: Never Smoker  . Smokeless tobacco: Never Used  Substance and Sexual Activity   . Alcohol use: No    Alcohol/week: 0.0 standard drinks  . Drug use: No  . Sexual activity: Not Currently    Birth control/protection: Post-menopausal  Lifestyle  . Physical activity    Days per week: Not on file    Minutes per session: Not on file  . Stress: Not on file  Relationships  . Social Herbalist on phone: Not on file    Gets together: Not on file    Attends religious service: Not on file    Active member of club or organization: Not on file    Attends meetings of clubs or organizations: Not on file    Relationship status: Not on file  . Intimate partner violence    Fear of current or ex partner: Not on file    Emotionally abused: Not on file    Physically abused: Not on file    Forced sexual activity: Not on file  Other Topics Concern  . Not on file  Social History Narrative  . Not on file    Review of Systems: See HPI, otherwise negative ROS  Physical Exam: BP (!) 112/52   Pulse 64   Temp (!) 97.4 F (36.3 C) (Oral)   Resp 20   Ht 5\' 2"  (1.575 m)   Wt 108.9 kg   SpO2 100%   BMI 43.90 kg/m  General:   Alert,  Well-developed, well-nourished, pleasant and cooperative in NAD Neck:  Supple; no masses or thyromegaly. No significant cervical adenopathy. Lungs:  Clear throughout to auscultation.   No wheezes, crackles, or rhonchi. No acute distress. Heart:  Regular rate and rhythm; no murmurs, clicks, rubs,  or gallops. Abdomen: Non-distended, normal bowel sounds.  Soft and nontender without appreciable mass or hepatosplenomegaly.  Pulses:  Normal pulses noted. Extremities:  Without clubbing or edema.  Impression/Plan: History of multiple colonic adenomas removed previously.  Here for 1 more surveillance colonoscopy per plan.  The risks, benefits, limitations, alternatives and imponderables have been reviewed with the patient. Questions have been answered. All parties are agreeable.      Notice: This dictation was prepared with Dragon dictation  along with smaller phrase technology. Any transcriptional errors that result from this process are unintentional and may not be corrected upon review.

## 2019-10-11 ENCOUNTER — Encounter: Payer: Self-pay | Admitting: Internal Medicine

## 2019-10-11 LAB — SURGICAL PATHOLOGY

## 2019-10-15 ENCOUNTER — Encounter (HOSPITAL_COMMUNITY): Payer: Self-pay | Admitting: Internal Medicine

## 2019-12-12 NOTE — Progress Notes (Signed)
Referring Provider: Petra Kuba, MD Primary Care Physician:  Petra Kuba, MD Primary GI: Dr. Gala Romney   Chief Complaint  Patient presents with  . Follow-up    fu from colonoscopy    HPI:   Shelley Mitchell is an 79 y.o. female presenting today with a history of constipation, recent colonoscopy completed in the interim with descending colon diverticulosis, two 4-5 mm polyps at splenic flexure (tubular adenomas). No surveillance due to age.   States she tried the Amitiza 8 mcg samples and like this. However, she has only been taking one gelcap as needed and not consistently. When needed, she will take with breakfast and sometimes has a BM that day, but sometimes will be a few days. No rectal bleeding. No GERD exacerbation or dysphagia. Good appetite. Purposeful weight loss with reducing soda intake and breads. Fleeting pain lower abdomen and sides lasting only a second every once in awhile. No N/V.   Past Medical History:  Diagnosis Date  . Endometrial polyp    Dr. Elonda Husky  . History of endometrial hyperplasia   . HTN (hypertension)   . Hx of adenomatous colonic polyps 1999  . Hyperlipidemia   . Mental disorder    GAD  . Obesity   . Osteoarthritis   . Tachycardia    subsequently with bradycardia (?secondary to meds)    Past Surgical History:  Procedure Laterality Date  . BACK SURGERY    . 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 CHOLECYSTECTOMY    . COLONOSCOPY  03/2002   Dr. Jamesetta Geralds internal hemorrhoids  . COLONOSCOPY  06/21/2011   polyp at IC valve (tubular adenoma)  . COLONOSCOPY N/A 07/15/2016   one 8 mm polyp at hepatic flexure (tubular adenoma), on 12 mm polyp at hepatic flexure (tubular adenoma), 3 year surveillance if health permits  . COLONOSCOPY N/A 10/10/2019   descending colon diverticulosis, two 4-5 mm polyps at splenic flexure (tubular adenomas). No surveillance due to age.   Marland Kitchen POLYPECTOMY  07/15/2016   Procedure: POLYPECTOMY;  Surgeon:  Daneil Dolin, MD;  Location: [facility];  Service: Endoscopy;;  Splenic Flexure polyps x 2 removed via hot snare  . POLYPECTOMY  10/10/2019   Procedure: POLYPECTOMY;  Surgeon: Daneil Dolin, MD;  Location: [facility];  Service: Endoscopy;;  . TUBAL LIGATION      Current Outpatient Medications  Medication Sig Dispense Refill  . acetaminophen (TYLENOL) 500 MG tablet Take 100 mg by mouth 2 (two) times daily.    Marland Kitchen aspirin 81 MG EC tablet Take 81 mg by mouth daily.      . CELEBREX 200 MG capsule Take 200 mg by mouth daily.     . hydrALAZINE (APRESOLINE) 100 MG tablet Take 50 mg by mouth 2 (two) times daily.     . hydrochlorothiazide 50 MG tablet Take 50 mg by mouth daily.     Marland Kitchen losartan (COZAAR) 100 MG tablet Take 100 mg by mouth daily.    . medroxyPROGESTERone (PROVERA) 5 MG tablet Take 1 tablet (5 mg total) by mouth daily. 90 tablet 4  . Multiple Vitamins-Minerals (MULTIVITAMIN WITH MINERALS) tablet Take 1 tablet by mouth daily.    Marland Kitchen NIFEdipine (PROCARDIA XL/ADALAT-CC) 90 MG 24 hr tablet Take 90 mg by mouth daily.     . nitroGLYCERIN (NITROSTAT) 0.4 MG SL tablet Place 0.4 mg under the tongue every 5 (five) minutes as needed for chest pain.    . pravastatin (PRAVACHOL) 40  MG tablet Take 40 mg by mouth daily.     . sertraline (ZOLOFT) 100 MG tablet Take 100 mg by mouth daily.     Marland Kitchen lubiprostone (AMITIZA) 8 MCG capsule Take 1 capsule (8 mcg total) by mouth daily with breakfast. 90 capsule 3   No current facility-administered medications for this visit.    Allergies as of 12/13/2019 - Review Complete 12/13/2019  Allergen Reaction Noted  . Ace inhibitors Other (See Comments) 03/17/2018  . Nsaids Other (See Comments) 03/17/2018    Family History  Problem Relation Age of Onset  . Diabetes Brother   . Colon polyps Brother   . Diabetes Sister   . Cancer Father        Likely prostate  . Stroke Mother 66  . Colon cancer Neg Hx     Social History   Socioeconomic History  .  Marital status: Married    Spouse name: Not on file  . Number of children: 4  . Years of education: Not on file  . Highest education level: Not on file  Occupational History    Employer: RETIRED  Tobacco Use  . Smoking status: Never Smoker  . Smokeless tobacco: Never Used  Substance and Sexual Activity  . Alcohol use: No    Alcohol/week: 0.0 standard drinks  . Drug use: No  . Sexual activity: Not Currently    Birth control/protection: Post-menopausal  Other Topics Concern  . Not on file  Social History Narrative  . Not on file   Social Determinants of Health   Financial Resource Strain:   . Difficulty of Paying Living Expenses: Not on file  Food Insecurity:   . Worried About Charity fundraiser in the Last Year: Not on file  . Ran Out of Food in the Last Year: Not on file  Transportation Needs:   . Lack of Transportation (Medical): Not on file  . Lack of Transportation (Non-Medical): Not on file  Physical Activity:   . Days of Exercise per Week: Not on file  . Minutes of Exercise per Session: Not on file  Stress:   . Feeling of Stress : Not on file  Social Connections:   . Frequency of Communication with Friends and Family: Not on file  . Frequency of Social Gatherings with Friends and Family: Not on file  . Attends Religious Services: Not on file  . Active Member of Clubs or Organizations: Not on file  . Attends Archivist Meetings: Not on file  . Marital Status: Not on file    Review of Systems: Gen: Denies fever, chills, anorexia. Denies fatigue, weakness, weight loss.  CV: Denies chest pain, palpitations, syncope, peripheral edema, and claudication. Resp: Denies dyspnea at rest, cough, wheezing, coughing up blood, and pleurisy. GI: see HPI Derm: Denies rash, itching, dry skin Psych: Denies depression, anxiety, memory loss, confusion. No homicidal or suicidal ideation.  Heme: Denies bruising, bleeding, and enlarged lymph nodes.  Physical Exam: BP  (!) 141/65   Pulse (!) 51   Temp (!) 96.2 F (35.7 C)   Ht 5\' 2"  (1.575 m)   Wt 235 lb (106.6 kg)   BMI 42.98 kg/m  General:   Alert and oriented. No distress noted. Pleasant and cooperative.  Head:  Normocephalic and atraumatic. Eyes:  Conjuctiva clear without scleral icterus. Abdomen:  +BS, soft, non-tender and non-distended. Limited exam with patient in chair Msk:  In wheelchair, slight kyphosis  Extremities:  With 1-2+ edema Neurologic:  Alert and  oriented x4 Psych:  Alert and cooperative. Normal mood and affect.  ASSESSMENT: NAYOMI BARUCH is an 79 y.o. female presenting today in follow-up after colonoscopy due to history of polyps; she will not need surveillance going forward due to age. Constipation was managed with Amitiza 71mcg but taking in a novel way of prn dosing. I feel she is not maximizing this as she has bouts with constipation taking in this manner. Discussed taking 8 mcg with breakfast daily, and she may increase to BID if needed. Discussed with patient that she would do best to have a more dedicated bowel regimen in this scenario. As she is doing well otherwise, will see her back in 1 year.    PLAN:   Amitiza 8 mcg sent to pharmacy to take once daily. May increase to BID if needed.  No surveillance colonoscopy needed  Return in 1 year  Annitta Needs, PhD, [facility]

## 2019-12-13 ENCOUNTER — Encounter: Payer: Self-pay | Admitting: Gastroenterology

## 2019-12-13 ENCOUNTER — Other Ambulatory Visit: Payer: Self-pay | Admitting: Gastroenterology

## 2019-12-13 ENCOUNTER — Ambulatory Visit (INDEPENDENT_AMBULATORY_CARE_PROVIDER_SITE_OTHER): Payer: Medicare HMO | Admitting: Gastroenterology

## 2019-12-13 ENCOUNTER — Other Ambulatory Visit: Payer: Self-pay

## 2019-12-13 VITALS — BP 141/65 | HR 51 | Temp 96.2°F | Ht 62.0 in | Wt 235.0 lb

## 2019-12-13 DIAGNOSIS — K5900 Constipation, unspecified: Secondary | ICD-10-CM

## 2019-12-13 DIAGNOSIS — Z86010 Personal history of colonic polyps: Secondary | ICD-10-CM | POA: Diagnosis not present

## 2019-12-13 MED ORDER — LUBIPROSTONE 8 MCG PO CAPS: 8.0000 ug | ORAL_CAPSULE | Freq: Every day | ORAL | 3 refills | Status: AC

## 2019-12-13 NOTE — Patient Instructions (Signed)
I have sent in Amitiza to start taking once each morning with breakfast. If needed, you can take up to twice a day (with a meal).  If once daily is too strong, you can do every other day.  We will see you in 1 year or sooner if needed! Please call with any questions in the meantime.  I enjoyed seeing you again today! As you know, I value our relationship and want to provide genuine, compassionate, and quality care. I welcome your feedback. If you receive a survey regarding your visit,  I greatly appreciate you taking time to fill this out. See you next time!  Annitta Needs, PhD, ANP-BC [facility]

## 2020-01-15 ENCOUNTER — Ambulatory Visit: Payer: Medicare HMO

## 2020-01-15 ENCOUNTER — Ambulatory Visit (INDEPENDENT_AMBULATORY_CARE_PROVIDER_SITE_OTHER): Payer: Medicare HMO | Admitting: Podiatry

## 2020-01-15 ENCOUNTER — Other Ambulatory Visit: Payer: Self-pay

## 2020-01-15 DIAGNOSIS — R233 Spontaneous ecchymoses: Secondary | ICD-10-CM | POA: Diagnosis not present

## 2020-01-15 DIAGNOSIS — L959 Vasculitis limited to the skin, unspecified: Secondary | ICD-10-CM | POA: Diagnosis not present

## 2020-01-15 DIAGNOSIS — M79672 Pain in left foot: Secondary | ICD-10-CM | POA: Diagnosis not present

## 2020-01-15 MED ORDER — METHYLPREDNISOLONE 4 MG PO TBPK: ORAL_TABLET | ORAL | 0 refills | Status: DC

## 2020-01-16 ENCOUNTER — Encounter: Payer: Self-pay | Admitting: Podiatry

## 2020-01-16 NOTE — Progress Notes (Signed)
Subjective:  Patient ID: Shelley Mitchell, female    DOB: 1941/09/01,  MRN: [id number]  Chief Complaint  Patient presents with  . Rash    pt has a possible skin rash around the left great hallux, as well as the left second toe, which has been going on for about 2 weeks. Pt states that she does not feel any pain, and also states that her toenails could be the cause behind it.    79 y.o. female presents with the above complaint.  Patient presents with left great toe as well as second toe with red petechiae skin lesion to the left side.  Patient states that this has been going for about 2 weeks have progressively gotten worse.  The petechiae started from the first on the second digit and now progressively getting worse in the traveling up.  Patient is not a diabetic.  They are not painful in nature.  Patient has tried some creams but has not helped much.  She denies seeing anyone else for this.  She would like to know if there is anything that could be done for this.  Patient denies that she is a smoker.   Review of Systems: Negative except as noted in the HPI. Denies N/V/F/Ch.  Past Medical History:  Diagnosis Date  . Endometrial polyp    Dr. Elonda Husky  . History of endometrial hyperplasia   . HTN (hypertension)   . Hx of adenomatous colonic polyps 1999  . Hyperlipidemia   . Mental disorder    GAD  . Obesity   . Osteoarthritis   . Tachycardia    subsequently with bradycardia (?secondary to meds)    Current Outpatient Medications:  .  acetaminophen (TYLENOL) 500 MG tablet, Take 100 mg by mouth 2 (two) times daily., Disp: , Rfl:  .  aspirin 81 MG EC tablet, Take 81 mg by mouth daily.  , Disp: , Rfl:  .  CELEBREX 200 MG capsule, Take 200 mg by mouth daily. , Disp: , Rfl:  .  hydrALAZINE (APRESOLINE) 100 MG tablet, Take 50 mg by mouth 2 (two) times daily. , Disp: , Rfl:  .  hydrochlorothiazide 50 MG tablet, Take 50 mg by mouth daily. , Disp: , Rfl:  .  losartan (COZAAR) 100 MG tablet, Take 100  mg by mouth daily., Disp: , Rfl:  .  lubiprostone (AMITIZA) 8 MCG capsule, Take 1 capsule (8 mcg total) by mouth daily with breakfast., Disp: 90 capsule, Rfl: 3 .  medroxyPROGESTERone (PROVERA) 5 MG tablet, Take 1 tablet (5 mg total) by mouth daily., Disp: 90 tablet, Rfl: 4 .  megestrol (MEGACE) 40 MG tablet, Take by mouth., Disp: , Rfl:  .  Multiple Vitamins-Minerals (MULTIVITAMIN WITH MINERALS) tablet, Take 1 tablet by mouth daily., Disp: , Rfl:  .  NIFEdipine (PROCARDIA XL/ADALAT-CC) 90 MG 24 hr tablet, Take 90 mg by mouth daily. , Disp: , Rfl:  .  nitroGLYCERIN (NITROSTAT) 0.4 MG SL tablet, Place 0.4 mg under the tongue every 5 (five) minutes as needed for chest pain., Disp: , Rfl:  .  pravastatin (PRAVACHOL) 40 MG tablet, Take 40 mg by mouth daily. , Disp: , Rfl:  .  sertraline (ZOLOFT) 100 MG tablet, Take 100 mg by mouth daily. , Disp: , Rfl:  .  methylPREDNISolone (MEDROL DOSEPAK) 4 MG TBPK tablet, Use as directed, Disp: 1 each, Rfl: 0  Social History   Tobacco Use  Smoking Status Never Smoker  Smokeless Tobacco Never Used    Allergies  Allergen Reactions  . Ace Inhibitors Other (See Comments)    itching  . Nsaids Other (See Comments)    itching   Objective:  There were no vitals filed for this visit. There is no height or weight on file to calculate BMI. Constitutional Well developed. Well nourished.  Vascular Dorsalis pedis pulses palpable bilaterally. Posterior tibial pulses palpable bilaterally. Capillary refill normal to all digits.  No cyanosis or clubbing noted. Pedal hair growth normal.  Neurologic Normal speech. Oriented to person, place, and time. Epicritic sensation to light touch grossly present bilaterally.  Dermatologic  small petechiae type lesion that are progressively traveling proximally starting from the distal tip of the second and fourth digit.  No pain on palpation to these lesions.  Orthopedic: Normal joint ROM without pain or crepitus  bilaterally. No visible deformities. No bony tenderness.   Radiographs: None Assessment:   1. 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 Petechiae    Plan:  Patient was evaluated and treated and all questions answered.  Vasculitis with skin lesions -I explained to the patient the etiology of vasculitis and various treatment options associated with it.  Given that this is progressively getting worse I believe patient will benefit from evaluation by dermatologist.  I also believe that patient will benefit from the short-term steroid management to help decrease some of the inflammatory component to this vasculitis.  I have given patient a Medrol Dosepak that is tapered down.  I would like for her to be seen by a dermatologist for further evaluation and management as this is generally not my clinical expertise. -I have explained this to the patien in great detail and patient states understanding.  Patient states that she has a dermatologist that she will follow-up in her hometown.  I have asked her to see them as soon as possible.  They state understanding    No follow-ups on file.

## 2020-02-06 ENCOUNTER — Other Ambulatory Visit: Payer: Self-pay | Admitting: Adult Health

## 2020-03-20 ENCOUNTER — Other Ambulatory Visit (HOSPITAL_COMMUNITY): Payer: Self-pay | Admitting: Family Medicine

## 2020-03-20 ENCOUNTER — Other Ambulatory Visit (HOSPITAL_COMMUNITY): Payer: Self-pay | Admitting: Anesthesiology

## 2020-03-20 ENCOUNTER — Other Ambulatory Visit (HOSPITAL_COMMUNITY): Payer: Self-pay

## 2020-03-20 DIAGNOSIS — Z1231 Encounter for screening mammogram for malignant neoplasm of breast: Secondary | ICD-10-CM

## 2020-04-28 ENCOUNTER — Ambulatory Visit (HOSPITAL_COMMUNITY): Payer: BC Managed Care – PPO

## 2020-05-01 ENCOUNTER — Other Ambulatory Visit: Payer: Self-pay

## 2020-05-01 ENCOUNTER — Ambulatory Visit (HOSPITAL_COMMUNITY)
Admission: RE | Admit: 2020-05-01 | Discharge: 2020-05-01 | Disposition: A | Discharge status: Home / Self Care | Payer: Medicare HMO | Source: Ambulatory Visit | Attending: Family Medicine | Admitting: Family Medicine

## 2020-05-01 DIAGNOSIS — Z1231 Encounter for screening mammogram for malignant neoplasm of breast: Secondary | ICD-10-CM | POA: Diagnosis not present

## 2020-05-01 IMAGING — MG DIGITAL SCREENING BILAT W/ TOMO W/ CAD
6 of 10 series · 6 of 30 positions shown · non-contrast
Comparison: Previous exam(s).

CLINICAL DATA: Screening.

EXAM:
DIGITAL SCREENING BILATERAL MAMMOGRAM WITH TOMO AND CAD

[L CC synth-2D (1 of 2)]
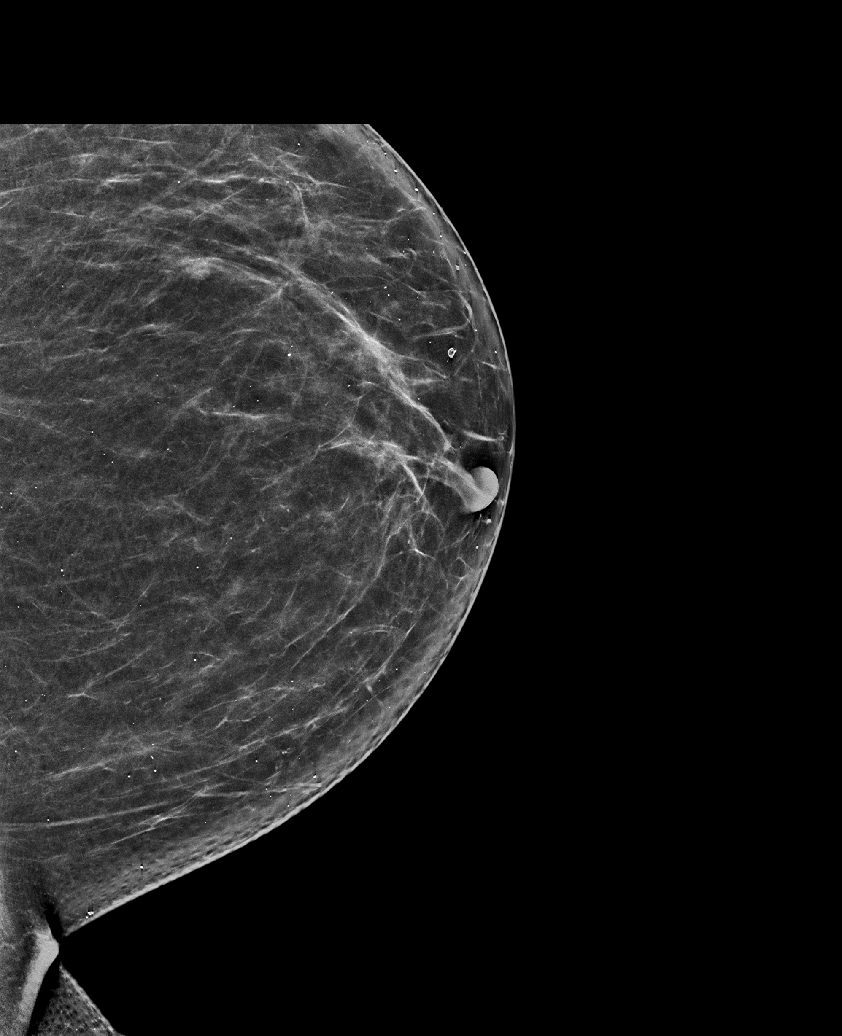

[R CC synth-2D]
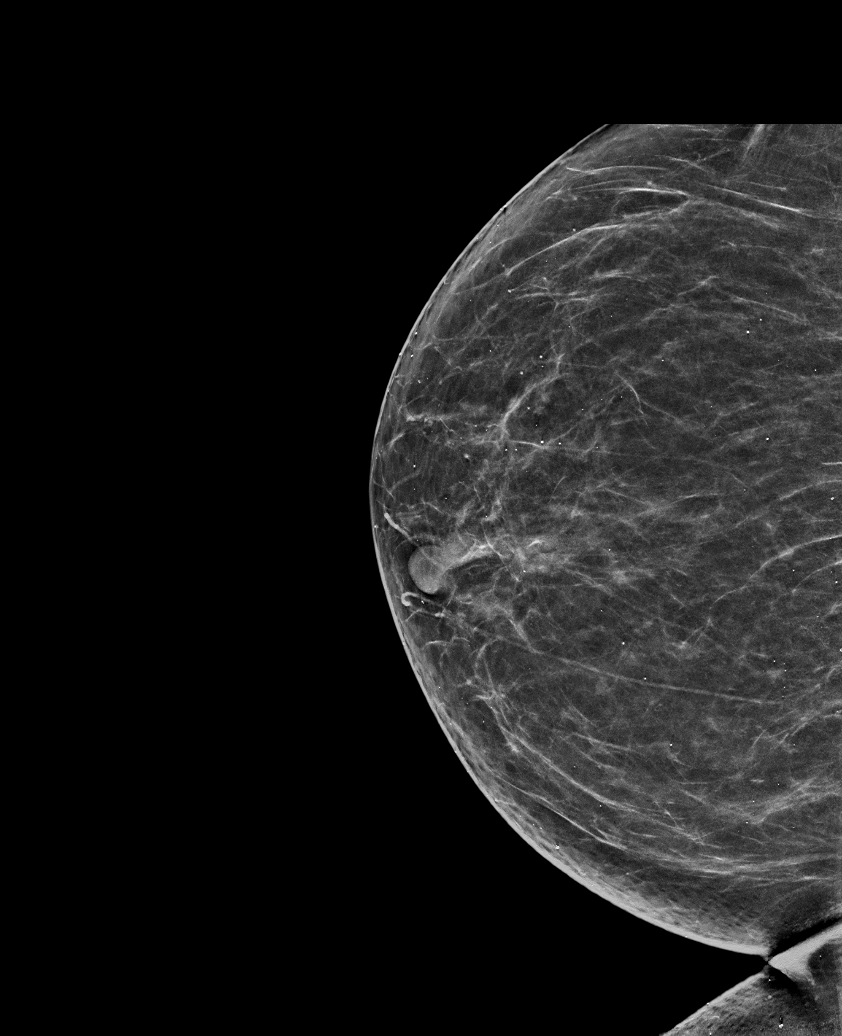

[L CC synth-2D (2 of 2)]
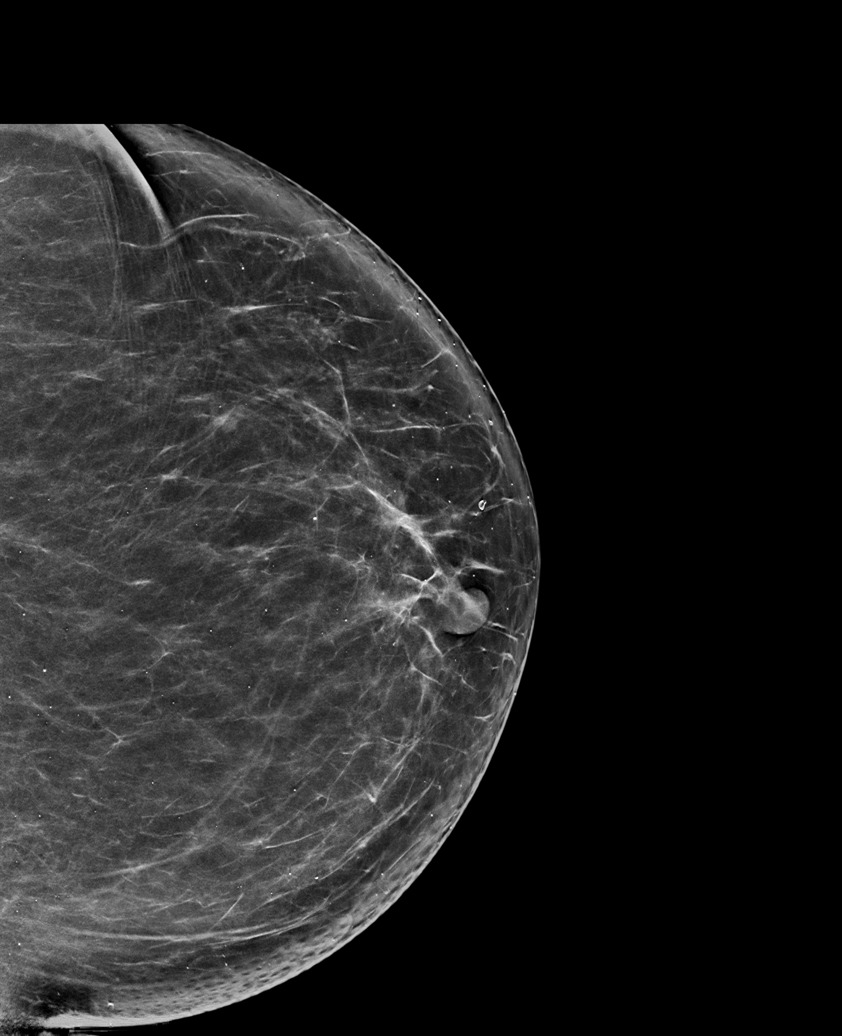

[R MLO synth-2D]
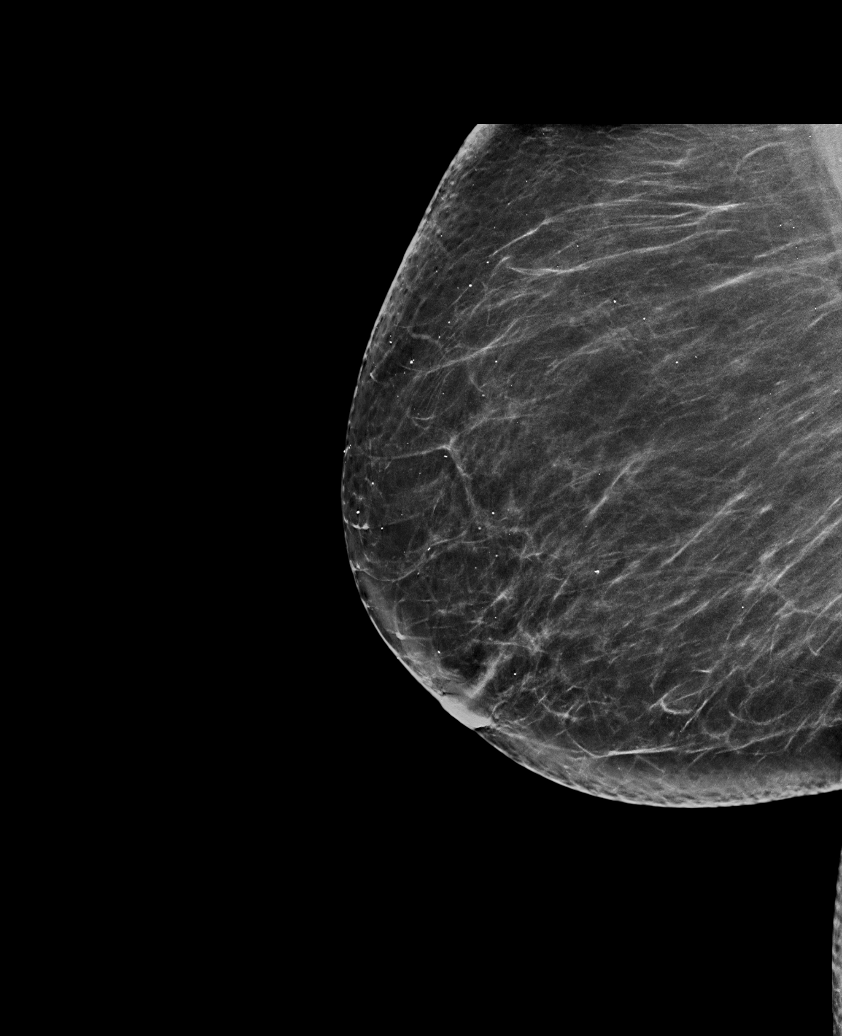

[L MLO synth-2D]
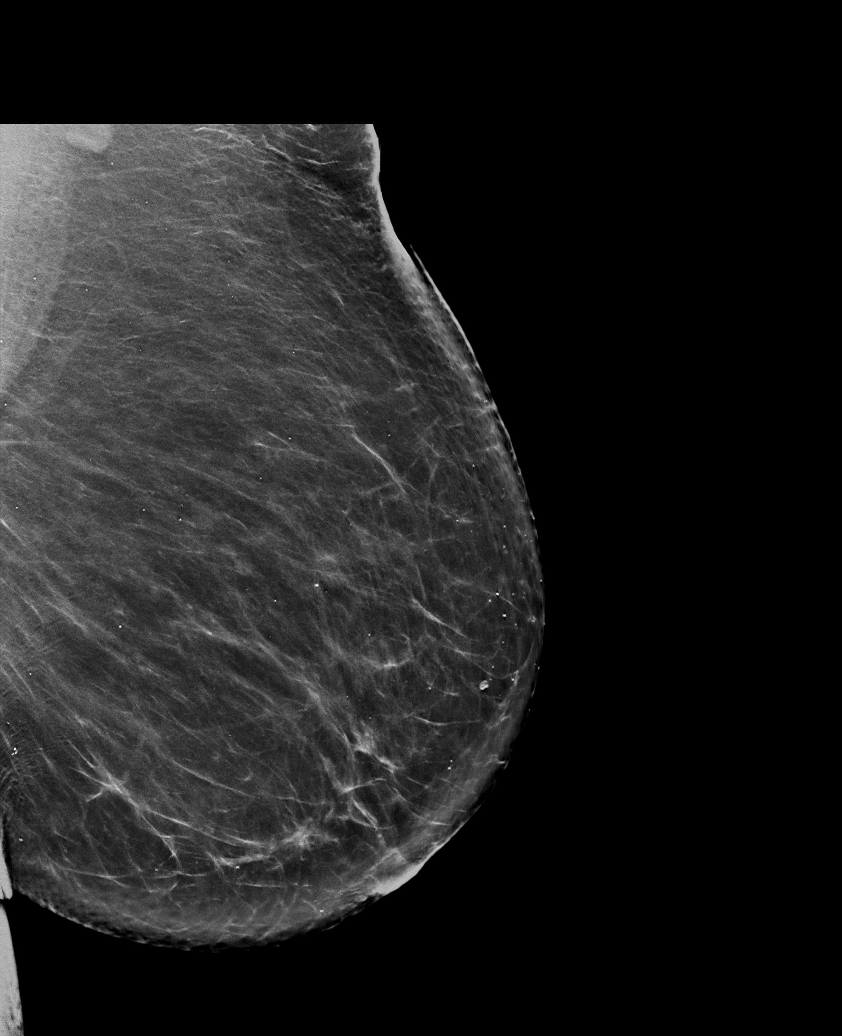

[R MLO tomo · tomo slice 43/86.0]
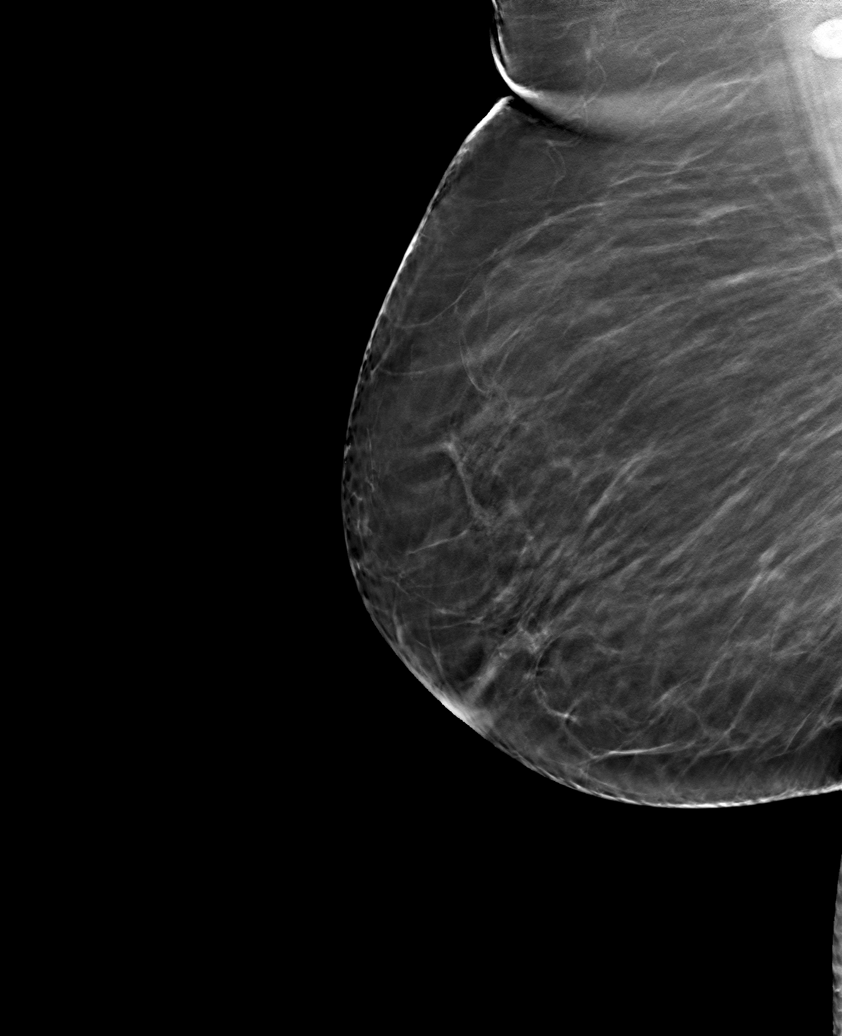

[6 of 30 positions shown; findings below may reference images not displayed]

ACR Breast Density Category b: There are scattered areas of
fibroglandular density.
FINDINGS: There are no findings suspicious for malignancy. Images were
processed with CAD.
IMPRESSION: No mammographic evidence of malignancy. A result letter of this
screening mammogram will be mailed directly to the patient.

RECOMMENDATION:
Screening mammogram in one year. (Code:[TQ])

BI-RADS CATEGORY  1: Negative.

## 2020-05-09 ENCOUNTER — Other Ambulatory Visit: Payer: Self-pay | Admitting: Adult Health

## 2020-06-30 ENCOUNTER — Telehealth: Payer: Self-pay | Admitting: Adult Health

## 2020-06-30 NOTE — Telephone Encounter (Signed)
Patient wants to know if she still needs to be taking provera 5 mg tablet still.

## 2020-06-30 NOTE — Telephone Encounter (Signed)
Telephoned patient at home number and advised patient per notes suppose to be taking Provera 5mg . Advised patient if she had concerns about medication could discuss at an appointment with physician. Patient voiced understanding.

## 2020-08-12 ENCOUNTER — Other Ambulatory Visit: Payer: Self-pay | Admitting: Adult Health

## 2020-09-08 ENCOUNTER — Telehealth: Payer: Self-pay | Admitting: Adult Health

## 2020-09-08 NOTE — Telephone Encounter (Signed)
Telephoned patient at home number and left message to return call.

## 2020-09-08 NOTE — Telephone Encounter (Signed)
Patient called stating that she uses [facility] prescription now and she states that she has been trying to send over a medication to them for them to have it so when she needs a refill of it, pt states that [facility] Rossetti has been trying to send over a prescription over to [place] but can not get in touch with [place]. Please contact pt

## 2020-09-18 ENCOUNTER — Telehealth: Payer: Self-pay | Admitting: Adult Health

## 2020-09-18 MED ORDER — MEDROXYPROGESTERONE ACETATE 5 MG PO TABS: 5.0000 mg | ORAL_TABLET | Freq: Every day | ORAL | 1 refills | Status: DC

## 2020-09-18 NOTE — Telephone Encounter (Signed)
Spoke with patient she needs Provera sent to [place].

## 2020-09-18 NOTE — Telephone Encounter (Signed)
Refilled provera 

## 2020-09-18 NOTE — Telephone Encounter (Signed)
Patient called stating that her insurance [employer] has sent over a request for a medication refill on one of her prescription. Pt states that [facility] states they have not heard anything from [place]. Please contact pt

## 2020-09-18 NOTE — Addendum Note (Signed)
Addended by: Derrek Monaco A on: 09/18/2020 05:03 PM   Modules accepted: Orders

## 2020-11-11 ENCOUNTER — Encounter: Payer: Self-pay | Admitting: Cardiology

## 2020-11-17 ENCOUNTER — Other Ambulatory Visit: Payer: Self-pay | Admitting: Family Medicine

## 2020-11-17 DIAGNOSIS — R0781 Pleurodynia: Secondary | ICD-10-CM

## 2021-02-05 ENCOUNTER — Ambulatory Visit: Payer: Medicare HMO | Admitting: Cardiology

## 2021-02-16 ENCOUNTER — Encounter: Payer: Self-pay | Admitting: Cardiology

## 2021-02-16 ENCOUNTER — Ambulatory Visit (INDEPENDENT_AMBULATORY_CARE_PROVIDER_SITE_OTHER): Payer: Medicare HMO | Admitting: Cardiology

## 2021-02-16 ENCOUNTER — Other Ambulatory Visit: Payer: Self-pay

## 2021-02-16 VITALS — BP 110/72 | HR 61 | Ht 62.0 in | Wt 241.1 lb

## 2021-02-16 DIAGNOSIS — I10 Essential (primary) hypertension: Secondary | ICD-10-CM

## 2021-02-16 DIAGNOSIS — R0683 Snoring: Secondary | ICD-10-CM

## 2021-02-16 DIAGNOSIS — R0602 Shortness of breath: Secondary | ICD-10-CM | POA: Diagnosis not present

## 2021-02-16 DIAGNOSIS — R011 Cardiac murmur, unspecified: Secondary | ICD-10-CM

## 2021-02-16 NOTE — Patient Instructions (Signed)
Medication Instructions:   Your physician recommends that you continue on your current medications as directed. Please refer to the Current Medication list given to you today.   *If you need a refill on your cardiac medications before your next appointment, please call your pharmacy*   Lab Work: None ordered If you have labs (blood work) drawn today and your tests are completely normal, you will receive your results only by: Marland Kitchen MyChart Message (if you have MyChart) OR . A paper copy in the mail If you have any lab test that is abnormal or we need to change your treatment, we will call you to review the results.   Testing/Procedures:  1.  Your physician has requested that you have an echocardiogram. Echocardiography is a painless test that uses sound waves to create images of your heart. It provides your doctor with information about the size and shape of your heart and how well your heart's chambers and valves are working. This procedure takes approximately one hour. There are no restrictions for this procedure.     Follow-Up: At [facility], you and your health needs are our priority.  As part of our continuing mission to provide you with exceptional heart care, we have created designated Provider Care Teams.  These Care Teams include your primary Cardiologist (physician) and Advanced Practice Providers (APPs -  Physician Assistants and Nurse Practitioners) who all work together to provide you with the care you need, when you need it.  We recommend signing up for the patient portal called "MyChart".  Sign up information is provided on this After Visit Summary.  MyChart is used to connect with patients for Virtual Visits (Telemedicine).  Patients are able to view lab/test results, encounter notes, upcoming appointments, etc.  Non-urgent messages can be sent to your provider as well.   To learn more about what you can do with MyChart, go to [email or web address].    Your next  appointment:   Follow up after Echo     The format for your next appointment:   In Person  Provider:   Kate Sable, MD   Other Instructions   Referral to Pulmonary for Sleep Eval

## 2021-02-16 NOTE — Progress Notes (Signed)
Cardiology Office Note:    Date:  02/16/2021   ID:  Shelley Mitchell, DOB 09/30/1941, MRN [id number]  PCP:  [name], [place]  Cardiologist:  Kate Sable, MD  Advanced Practice Provider:  No care team member to display Electrophysiologist:  None       Referring MD: [name], MD   Chief Complaint  Patient presents with  . New Patient (Initial Visit)    Ref by Dr. Maia Petties for heart murmur/diastolic dysfunction/HTN/atherosclerotic heart disease. Medications reviewed by the patient verbally. Patient c/o rapid heart beats, shortness of breath with exertion, LE edema and fatigue.      History of Present Illness:    Shelley Mitchell is a 80 y.o. female with a hx of hypertension, hyperlipidemia, cardiac murmur presenting with shortness of breath.  Patient states having shortness of breath for some time, will also establish care with cardiology.  She denies any history of heart disease, denies chest pain.  States having shortness of breath when she exerts herself.  Also endorses daytime fatigue, somnolence, lower extremity edema which usually worsens as the day progresses.  Past Medical History:  Diagnosis Date  . Endometrial polyp    Dr. Elonda Husky  . History of endometrial hyperplasia   . HTN (hypertension)   . Hx of adenomatous colonic polyps 1999  . Hyperlipidemia   . Mental disorder    GAD  . Obesity   . Osteoarthritis   . Tachycardia    subsequently with bradycardia (?secondary to meds)    Past Surgical History:  Procedure Laterality Date  . BACK SURGERY    . 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 CHOLECYSTECTOMY    . COLONOSCOPY  03/2002   Dr. Jamesetta Geralds internal hemorrhoids  . COLONOSCOPY  06/21/2011   polyp at IC valve (tubular adenoma)  . COLONOSCOPY N/A 07/15/2016   one 8 mm polyp at hepatic flexure (tubular adenoma), on 12 mm polyp at hepatic flexure (tubular adenoma), 3 year surveillance if health permits  . COLONOSCOPY N/A  10/10/2019   descending colon diverticulosis, two 4-5 mm polyps at splenic flexure (tubular adenomas). No surveillance due to age.   Marland Kitchen POLYPECTOMY  07/15/2016   Procedure: POLYPECTOMY;  Surgeon: Daneil Dolin, MD;  Location: [facility];  Service: Endoscopy;;  Splenic Flexure polyps x 2 removed via hot snare  . POLYPECTOMY  10/10/2019   Procedure: POLYPECTOMY;  Surgeon: Daneil Dolin, MD;  Location: [facility];  Service: Endoscopy;;  . TUBAL LIGATION      Current Medications: Current Meds  Medication Sig  . acetaminophen (TYLENOL) 500 MG tablet Take 100 mg by mouth 2 (two) times daily.  Marland Kitchen aspirin 81 MG EC tablet Take 81 mg by mouth daily.  . CELEBREX 200 MG capsule Take 200 mg by mouth daily.  . hydrALAZINE (APRESOLINE) 50 MG tablet Take 50 mg by mouth 3 (three) times daily.  . hydrochlorothiazide 50 MG tablet Take 50 mg by mouth daily.  Marland Kitchen latanoprost (XALATAN) 0.005 % ophthalmic solution INSTILL 1 DROP INTO BOTH EYES EVERY NIGHT  . losartan (COZAAR) 100 MG tablet Take 100 mg by mouth daily.  Marland Kitchen lubiprostone (AMITIZA) 8 MCG capsule Take 1 capsule (8 mcg total) by mouth daily with breakfast.  . methylPREDNISolone (MEDROL DOSEPAK) 4 MG TBPK tablet Use as directed  . Multiple Vitamins-Minerals (MULTIVITAMIN WITH MINERALS) tablet Take 1 tablet by mouth daily.  Marland Kitchen NIFEdipine (PROCARDIA XL/ADALAT-CC) 90 MG 24 hr tablet Take 90  mg by mouth daily.  . nitroGLYCERIN (NITROSTAT) 0.4 MG SL tablet Place 0.4 mg under the tongue every 5 (five) minutes as needed for chest pain.  . pravastatin (PRAVACHOL) 40 MG tablet Take 40 mg by mouth daily.  . sertraline (ZOLOFT) 100 MG tablet Take 100 mg by mouth daily.      Allergies:   Ace inhibitors and Nsaids   Social History   Socioeconomic History  . Marital status: Married    Spouse name: Not on file  . Number of children: 4  . Years of education: Not on file  . Highest education level: Not on file  Occupational History    Employer: RETIRED   Tobacco Use  . Smoking status: Never Smoker  . Smokeless tobacco: Never Used  Vaping Use  . Vaping Use: Never used  Substance and Sexual Activity  . Alcohol use: No    Alcohol/week: 0.0 standard drinks  . Drug use: No  . Sexual activity: Not Currently    Birth control/protection: Post-menopausal  Other Topics Concern  . Not on file  Social History Narrative  . Not on file   Social Determinants of Health   Financial Resource Strain: Not on file  Food Insecurity: Not on file  Transportation Needs: Not on file  Physical Activity: Not on file  Stress: Not on file  Social Connections: Not on file     Family History: The patient's family history includes Cancer in her father; Colon polyps in her brother; Diabetes in her brother and sister; Stroke (age of onset: 53) in her mother. There is no history of Colon cancer.  ROS:   Please see the history of present illness.     All other systems reviewed and are negative.  EKGs/Labs/Other Studies Reviewed:    The following studies were reviewed today:   EKG:  EKG is  ordered today.  The ekg ordered today demonstrates sinus rhythm, PACs  Recent Labs: No results found for requested labs within last 8760 hours.  Recent Lipid Panel No results found for: CHOL, TRIG, HDL, CHOLHDL, VLDL, LDLCALC, LDLDIRECT   Risk Assessment/Calculations:      Physical Exam:    VS:  BP 110/72 (BP Location: Left Wrist, Patient Position: Sitting, Cuff Size: Normal)   Pulse 61   Ht 5\' 2"  (1.575 m)   Wt 241 lb 2 oz (109.4 kg)   SpO2 98%   BMI 44.10 kg/m     Wt Readings from Last 3 Encounters:  02/16/21 241 lb 2 oz (109.4 kg)  12/13/19 235 lb (106.6 kg)  10/10/19 240 lb (108.9 kg)     GEN:  Well nourished, well developed in no acute distress HEENT: Normal NECK: No JVD; No carotid bruits LYMPHATICS: No lymphadenopathy CARDIAC: RRR, 2/6 systolic murmur RESPIRATORY:  Clear to auscultation without rales, wheezing or rhonchi  ABDOMEN: Soft,  non-tender, non-distended MUSCULOSKELETAL:  1+ edema; No deformity  SKIN: Warm and dry NEUROLOGIC:  Alert and oriented x 3 PSYCHIATRIC:  Normal affect   ASSESSMENT:    1. Shortness of breath   2. Systolic murmur   3. Primary hypertension   4. Snoring    PLAN:    In order of problems listed above:  1. Patient with shortness of breath, symptoms likely from deconditioning or diastolic dysfunction due to history of hypertension.  Obtain echocardiogram to evaluate systolic and diastolic function.  Continue current medications as prescribed.  She may also have OSA.  Leg elevation advised for edema.  Consider diuretic after  echo. 2. Systolic murmur on exam, echocardiogram was approved. 3. Hypertension, BP controlled.  Continue current BP meds 4. Snoring, daytime somnolence, fatigue.  Refer to pulmonary medicine for OSA eval and management.  Follow-up after echocardiogram     Medication Adjustments/Labs and Tests Ordered: Current medicines are reviewed at length with the patient today.  Concerns regarding medicines are outlined above.  Orders Placed This Encounter  Procedures  . Ambulatory referral to Pulmonology  . EKG 12-Lead  . ECHOCARDIOGRAM COMPLETE   No orders of the defined types were placed in this encounter.   Patient Instructions  Medication Instructions:   Your physician recommends that you continue on your current medications as directed. Please refer to the Current Medication list given to you today.   *If you need a refill on your cardiac medications before your next appointment, please call your pharmacy*   Lab Work: None ordered If you have labs (blood work) drawn today and your tests are completely normal, you will receive your results only by: Marland Kitchen MyChart Message (if you have MyChart) OR . A paper copy in the mail If you have any lab test that is abnormal or we need to change your treatment, we will call you to review the  results.   Testing/Procedures:  1.  Your physician has requested that you have an echocardiogram. Echocardiography is a painless test that uses sound waves to create images of your heart. It provides your doctor with information about the size and shape of your heart and how well your heart's chambers and valves are working. This procedure takes approximately one hour. There are no restrictions for this procedure.     Follow-Up: At [facility], you and your health needs are our priority.  As part of our continuing mission to provide you with exceptional heart care, we have created designated Provider Care Teams.  These Care Teams include your primary Cardiologist (physician) and Advanced Practice Providers (APPs -  Physician Assistants and Nurse Practitioners) who all work together to provide you with the care you need, when you need it.  We recommend signing up for the patient portal called "MyChart".  Sign up information is provided on this After Visit Summary.  MyChart is used to connect with patients for Virtual Visits (Telemedicine).  Patients are able to view lab/test results, encounter notes, upcoming appointments, etc.  Non-urgent messages can be sent to your provider as well.   To learn more about what you can do with MyChart, go to [email or web address].    Your next appointment:   Follow up after Echo     The format for your next appointment:   In Person  Provider:   Kate Sable, MD   Other Instructions   Referral to Pulmonary for Sleep Eval     Signed, Kate Sable, MD  02/16/2021 5:12 PM    [place]

## 2021-02-20 ENCOUNTER — Other Ambulatory Visit: Payer: Self-pay | Admitting: Adult Health

## 2021-03-04 ENCOUNTER — Telehealth: Payer: Self-pay | Admitting: Adult Health

## 2021-03-04 NOTE — Telephone Encounter (Signed)
Patient called stating that she received her a letter from [facility] stating that they will not longer pay for her Methylprednisolone, patient would like a call back from a nurse. Patient states that she would like to give her maybe the number to [facility] and their fax number. Please contact pt

## 2021-03-05 ENCOUNTER — Other Ambulatory Visit: Payer: Self-pay | Admitting: Obstetrics & Gynecology

## 2021-03-05 MED ORDER — MEDROXYPROGESTERONE ACETATE 10 MG PO TABS: 10.0000 mg | ORAL_TABLET | Freq: Every day | ORAL | 3 refills | Status: DC

## 2021-03-05 NOTE — Telephone Encounter (Signed)
Eprescribed.

## 2021-03-05 NOTE — Telephone Encounter (Signed)
There is a discrepancy, it was listed as methyprednisolone, I am assuming medroxyprogesterone,  Yes she needs to stay on it, it is cheap so she can pay out of pocket, every 3 years is fine

## 2021-03-05 NOTE — Telephone Encounter (Signed)
Pt was advised to stay on Medroxyprogesterone. Dr. Elonda Husky advised it is cheap. Please send prescription to [facility]. Thanks!! [place]

## 2021-03-05 NOTE — Telephone Encounter (Signed)
Great!

## 2021-03-10 ENCOUNTER — Other Ambulatory Visit: Payer: BC Managed Care – PPO

## 2021-03-26 ENCOUNTER — Other Ambulatory Visit: Payer: Self-pay | Admitting: Obstetrics & Gynecology

## 2021-03-26 ENCOUNTER — Other Ambulatory Visit (HOSPITAL_COMMUNITY): Payer: Self-pay | Admitting: Obstetrics & Gynecology

## 2021-03-26 DIAGNOSIS — Z1231 Encounter for screening mammogram for malignant neoplasm of breast: Secondary | ICD-10-CM

## 2021-03-31 NOTE — Progress Notes (Signed)
@Patient  ID: Shelley Mitchell, female    DOB: 1941/05/08, 80 y.o.   MRN: [id number]  Chief Complaint  Patient presents with  . Consult    Sleep consult- snoring, daytime sleepiness.    Referring provider: Kate Sable, MD  HPI: 80 year old female, never smoked.  Past medical history significant for hypertension, hyperlipidemia, systolic murmur, endometrial hyperplasia, constipation.  Patient referred to [facility] pulmonary by [name] for sleep consult due to snoring.  04/01/2021 Patient presents today for sleep consult. She experiences symptoms of shortness of breath with exertion. Associated fatigue and lower extremity edema.  She recently established with cardiology, shortness of breath likely from deconditioning or diastolic dysfunction due to history of hypertension. She had an echocardiogram earlier today. Consideration for adding diuretic after results from echo. Needing to rule out OSA.   Sleep questionnaire:   Prior sleep studies- None Symptoms-  Snoring and daytime sleepiness  Bedtime- 9-10pm Length of time to fall asleep- 15-30 mins Nocturnal awakenings- 4 Out of bed in morning- 9:30am Weight change- 10 lbs   Allergies  Allergen Reactions  . Ace Inhibitors Other (See Comments)    itching  . Nsaids Other (See Comments)    itching    Immunization History  Administered Date(s) Administered  . Moderna SARS-COV2 Booster Vaccination 11/11/2020  . Moderna Sars-Covid-2 Vaccination 12/21/2019, 01/18/2020    Past Medical History:  Diagnosis Date  . Endometrial polyp    Dr. Elonda Husky  . History of endometrial hyperplasia   . HTN (hypertension)   . Hx of adenomatous colonic polyps 1999  . Hyperlipidemia   . Mental disorder    GAD  . Obesity   . Osteoarthritis   . Tachycardia    subsequently with bradycardia (?secondary to meds)    Tobacco History: Social History   Tobacco Use  Smoking Status Never Smoker  Smokeless Tobacco Never Used   Counseling  given: Not Answered   Outpatient Medications Prior to Visit  Medication Sig Dispense Refill  . acetaminophen (TYLENOL) 500 MG tablet Take 100 mg by mouth 2 (two) times daily.    Marland Kitchen aspirin 81 MG EC tablet Take 81 mg by mouth daily.    . CELEBREX 200 MG capsule Take 200 mg by mouth daily.    . hydrALAZINE (APRESOLINE) 50 MG tablet Take 50 mg by mouth 3 (three) times daily.    . hydrochlorothiazide 50 MG tablet Take 50 mg by mouth daily.    Marland Kitchen latanoprost (XALATAN) 0.005 % ophthalmic solution INSTILL 1 DROP INTO BOTH EYES EVERY NIGHT    . losartan (COZAAR) 100 MG tablet Take 100 mg by mouth daily.    Marland Kitchen lubiprostone (AMITIZA) 8 MCG capsule Take 1 capsule (8 mcg total) by mouth daily with breakfast. 90 capsule 3  . medroxyPROGESTERone (PROVERA) 10 MG tablet Take 1 tablet (10 mg total) by mouth daily. 10 days per month 30 tablet 3  . methylPREDNISolone (MEDROL DOSEPAK) 4 MG TBPK tablet Use as directed 1 each 0  . Multiple Vitamins-Minerals (MULTIVITAMIN WITH MINERALS) tablet Take 1 tablet by mouth daily.    Marland Kitchen NIFEdipine (PROCARDIA XL/ADALAT-CC) 90 MG 24 hr tablet Take 90 mg by mouth daily.    . nitroGLYCERIN (NITROSTAT) 0.4 MG SL tablet Place 0.4 mg under the tongue every 5 (five) minutes as needed for chest pain.    . pravastatin (PRAVACHOL) 40 MG tablet Take 40 mg by mouth daily.    . sertraline (ZOLOFT) 100 MG tablet Take 100 mg by mouth daily.     Marland Kitchen  VOLTAREN 1 % GEL Apply 2 gram to affected area four times a day  for pain in neck/shoulder     No facility-administered medications prior to visit.    Review of Systems  Review of Systems  Constitutional: Positive for fatigue.  HENT: Negative.   Respiratory: Positive for shortness of breath.   Cardiovascular: Positive for leg swelling.  Psychiatric/Behavioral: Positive for sleep disturbance.    Physical Exam  BP 140/72 (BP Location: Left Arm, Patient Position: Sitting, Cuff Size: Normal)   Pulse (!) 56   Temp (!) 96.6 F (35.9 C)  (Temporal)   Ht 5\' 5"  (1.651 m)   Wt 241 lb (109.3 kg)   SpO2 97%   BMI 40.10 kg/m  Physical Exam Constitutional:      Appearance: Normal appearance.  HENT:     Mouth/Throat:     Mouth: Mucous membranes are moist.     Pharynx: Oropharynx is clear.     Comments: Mallampati class II Cardiovascular:     Rate and Rhythm: Normal rate and regular rhythm.  Pulmonary:     Effort: Pulmonary effort is normal.     Breath sounds: Normal breath sounds.  Musculoskeletal:        General: Normal range of motion.  Skin:    General: Skin is warm and dry.  Neurological:     General: No focal deficit present.     Mental Status: She is alert and oriented to person, place, and time. Mental status is at baseline.  Psychiatric:        Mood and Affect: Mood normal.        Behavior: Behavior normal.        Thought Content: Thought content normal.        Judgment: Judgment normal.      Lab Results:  CBC No results found for: WBC, RBC, HGB, HCT, PLT, MCV, MCH, MCHC, RDW, LYMPHSABS, MONOABS, EOSABS, BASOSABS  BMET No results found for: NA, K, CL, CO2, GLUCOSE, BUN, CREATININE, CALCIUM, GFRNONAA, GFRAA  BNP No results found for: BNP  ProBNP No results found for: PROBNP  Imaging: ECHOCARDIOGRAM COMPLETE  Result Date: 04/02/2021    ECHOCARDIOGRAM REPORT   Patient Name:   KATRYNA TSCHIRHART Date of Exam: 04/01/2021 Medical Rec #:  [id number]    Height:       62.0 in Accession #:    [id number]   Weight:       241.1 lb Date of Birth:  May 11, 1941    BSA:          2.070 m Patient Age:    55 years     BP:           110/72 mmHg Patient Gender: F            HR:           66 bpm. Exam Location:  [place] Procedure: 2D Echo, Color Doppler and Cardiac Doppler Indications:    R01.1 Murmur; R06.02 SOB  History:        Patient has no prior history of Echocardiogram examinations.                 Signs/Symptoms:Shortness of Breath, Dyspnea and Murmur; Risk                 Factors:Non-Smoker. Patient denies chest pain.  She has SOB with                 leg edema.  Sonographer:  Alecia Mackin RVT, RDCS (AE), RDMS Referring Phys: [id number] [facility]  Sonographer Comments: Suboptimal parasternal window and patient is morbidly obese. Image acquisition challenging due to patient body habitus and patient unable to roll or move on her own. IMPRESSIONS  1. Left ventricular ejection fraction, by estimation, is 65 to 70%. Left ventricular ejection fraction by 3D volume is 62 %. The left ventricle has normal function. The left ventricle has no regional wall motion abnormalities. Left ventricular diastolic  parameters are consistent with Grade I diastolic dysfunction (impaired relaxation).  2. Right ventricular systolic function is normal. The right ventricular size is normal.  3. The mitral valve is normal in structure. Trivial mitral valve regurgitation.  4. The aortic valve is tricuspid. Aortic valve regurgitation is not visualized. Mild to moderate aortic valve sclerosis/calcification is present, without any evidence of aortic stenosis.  5. The inferior vena cava is normal in size with greater than 50% respiratory variability, suggesting right atrial pressure of 3 mmHg. FINDINGS  Left Ventricle: Left ventricular ejection fraction, by estimation, is 65 to 70%. Left ventricular ejection fraction by 3D volume is 62 %. The left ventricle has normal function. The left ventricle has no regional wall motion abnormalities. The left ventricular internal cavity size was normal in size. There is no left ventricular hypertrophy. Left ventricular diastolic parameters are consistent with Grade I diastolic dysfunction (impaired relaxation). Right Ventricle: The right ventricular size is normal. No increase in right ventricular wall thickness. Right ventricular systolic function is normal. Left Atrium: Left atrial size was normal in size. Right Atrium: Right atrial size was normal in size. Pericardium: There is no evidence of pericardial effusion.  Mitral Valve: The mitral valve is normal in structure. Trivial mitral valve regurgitation. Tricuspid Valve: The tricuspid valve is normal in structure. Tricuspid valve regurgitation is not demonstrated. Aortic Valve: The aortic valve is tricuspid. Aortic valve regurgitation is not visualized. Mild to moderate aortic valve sclerosis/calcification is present, without any evidence of aortic stenosis. Aortic valve mean gradient measures 4.0 mmHg. Aortic valve peak gradient measures 9.0 mmHg. Aortic valve area, by VTI measures 2.56 cm. Pulmonic Valve: The pulmonic valve was not well visualized. Pulmonic valve regurgitation is not visualized. Aorta: The aortic root is normal in size and structure. Venous: The inferior vena cava is normal in size with greater than 50% respiratory variability, suggesting right atrial pressure of 3 mmHg. IAS/Shunts: No atrial level shunt detected by color flow Doppler.  LEFT VENTRICLE PLAX 2D LVIDd:         4.00 cm         Diastology LVIDs:         2.50 cm         LV e' medial:    5.28 cm/s LV PW:         0.80 cm         LV E/e' medial:  11.5 LV IVS:        1.20 cm         LV e' lateral:   10.90 cm/s LVOT diam:     2.00 cm         LV E/e' lateral: 5.6 LV SV:         66 LV SV Index:   32 LVOT Area:     3.14 cm        3D Volume EF  LV 3D EF:    Left                                             ventricular LV Volumes (MOD)                            ejection LV vol d, MOD    57.1 ml                    fraction by A2C:                                        3D volume LV vol d, MOD    57.1 ml                    is 62 %. A4C: LV vol s, MOD    24.1 ml A2C:                           3D Volume EF: LV vol s, MOD    17.4 ml       3D EF:        62 % A4C:                           LV EDV:       219 ml LV SV MOD A2C:   33.0 ml       LV ESV:       84 ml LV SV MOD A4C:   57.1 ml       LV SV:        135 ml LV SV MOD BP:    36.3 ml RIGHT VENTRICLE RV S prime:     15.10 cm/s  TAPSE (M-mode): 1.9 cm LEFT ATRIUM             Index       RIGHT ATRIUM           Index LA diam:        3.50 cm 1.69 cm/m  RA Area:     13.40 cm LA Vol (A2C):   41.9 ml 20.24 ml/m RA Volume:   30.40 ml  14.69 ml/m LA Vol (A4C):   53.2 ml 25.70 ml/m LA Biplane Vol: 48.8 ml 23.58 ml/m  AORTIC VALVE                   PULMONIC VALVE AV Area (Vmax):    2.43 cm    PV Vmax:       0.99 m/s AV Area (Vmean):   2.62 cm    PV Peak grad:  3.9 mmHg AV Area (VTI):     2.56 cm AV Vmax:           150.00 cm/s AV Vmean:          92.100 cm/s AV VTI:            0.256 m AV Peak Grad:      9.0 mmHg AV Mean Grad:      4.0 mmHg LVOT Vmax:         116.00 cm/s LVOT Vmean:  76.700 cm/s LVOT VTI:          0.209 m LVOT/AV VTI ratio: 0.82  AORTA Ao Root diam: 3.20 cm Ao Arch diam: 2.7 cm MITRAL VALVE                TRICUSPID VALVE MV Area (PHT): 2.39 cm     TR Peak grad:   29.4 mmHg MV Decel Time: 318 msec     TR Vmax:        271.00 cm/s MV E velocity: 60.60 cm/s MV A velocity: 115.50 cm/s  SHUNTS MV E/A ratio:  0.52         Systemic VTI:  0.21 m                             Systemic Diam: 2.00 cm Kate Sable MD Electronically signed by Kate Sable MD Signature Date/Time: 04/02/2021/8:01:09 AM    Final      Assessment & Plan:   Loud snoring - Referred to [facility] pulmonary for sleep consult by cardiology to r/o sleep apnea. Patient has symptoms of loud snoring and daytime sleepiness. She frequently wakes up at night. She gets on average 9-10 hours of sleep each night. Her weight is up 10lbs over the last 2 years. Needs HST to evaluate for underlying obstructive sleep apnea. We discussed risk of untreated sleep apnea including cardiac arrhthymias, stroke, pulm HTN or DM. We reviewed treatment options including weight loss, side sleeping position, oral appliance, CPAP therapy or referral to ENT for possible surgical options. FU in 4-6 weeks to review sleep study results.    Martyn Ehrich, NP 04/22/2021

## 2021-04-01 ENCOUNTER — Encounter: Payer: Self-pay | Admitting: Primary Care

## 2021-04-01 ENCOUNTER — Ambulatory Visit (INDEPENDENT_AMBULATORY_CARE_PROVIDER_SITE_OTHER): Payer: Medicare HMO

## 2021-04-01 ENCOUNTER — Other Ambulatory Visit: Payer: Self-pay | Admitting: Cardiology

## 2021-04-01 ENCOUNTER — Other Ambulatory Visit: Payer: Self-pay

## 2021-04-01 ENCOUNTER — Ambulatory Visit (INDEPENDENT_AMBULATORY_CARE_PROVIDER_SITE_OTHER): Payer: Medicare HMO | Admitting: Primary Care

## 2021-04-01 VITALS — BP 140/72 | HR 56 | Temp 96.6°F | Ht 65.0 in | Wt 241.0 lb

## 2021-04-01 DIAGNOSIS — R0683 Snoring: Secondary | ICD-10-CM

## 2021-04-01 DIAGNOSIS — R0602 Shortness of breath: Secondary | ICD-10-CM | POA: Diagnosis not present

## 2021-04-01 NOTE — Patient Instructions (Addendum)
Untreated sleep apnea put you at higher risk for cardiovascular disease, cardiac arrhythmias, pulmonary hypertension, stroke, diabetes and increased risk for daytime accidents  Treatment options for sleep apnea include weight loss, side sleeping position, oral appliance, CPAP therapy or referral to ear nose and throat for possible surgical options  Orders: Home sleep study re: snoring   Follow-up: 4-6 week televisit with Beth to review sleep study    Sleep Apnea Sleep apnea affects breathing during sleep. It causes breathing to stop for a short time or to become shallow. It can also increase the risk of:  Heart attack.  Stroke.  Being very overweight (obese).  Diabetes.  Heart failure.  Irregular heartbeat. The goal of treatment is to help you breathe normally again. What are the causes? There are three kinds of sleep apnea:  Obstructive sleep apnea. This is caused by a blocked or collapsed airway.  Central sleep apnea. This happens when the brain does not send the right signals to the muscles that control breathing.  Mixed sleep apnea. This is a combination of obstructive and central sleep apnea. The most common cause of this condition is a collapsed or blocked airway. This can happen if:  Your throat muscles are too relaxed.  Your tongue and tonsils are too large.  You are overweight.  Your airway is too small.   What increases the risk?  Being overweight.  Smoking.  Having a small airway.  Being older.  Being female.  Drinking alcohol.  Taking medicines to calm yourself (sedatives or tranquilizers).  Having family members with the condition. What are the signs or symptoms?  Trouble staying asleep.  Being sleepy or tired during the day.  Getting angry a lot.  Loud snoring.  Headaches in the morning.  Not being able to focus your mind (concentrate).  Forgetting things.  Less interest in sex.  Mood swings.  Personality  changes.  Feelings of sadness (depression).  Waking up a lot during the night to pee (urinate).  Dry mouth.  Sore throat. How is this diagnosed?  Your medical history.  A physical exam.  A test that is done when you are sleeping (sleep study). The test is most often done in a sleep lab but may also be done at home. How is this treated?  Sleeping on your side.  Using a medicine to get rid of mucus in your nose (decongestant).  Avoiding the use of alcohol, medicines to help you relax, or certain pain medicines (narcotics).  Losing weight, if needed.  Changing your diet.  Not smoking.  Using a machine to open your airway while you sleep, such as: ? An oral appliance. This is a mouthpiece that shifts your lower jaw forward. ? A CPAP device. This device blows air through a mask when you breathe out (exhale). ? An EPAP device. This has valves that you put in each nostril. ? A BPAP device. This device blows air through a mask when you breathe in (inhale) and breathe out.  Having surgery if other treatments do not work. It is important to get treatment for sleep apnea. Without treatment, it can lead to:  High blood pressure.  Coronary artery disease.  In men, not being able to have an erection (impotence).  Reduced thinking ability.   Follow these instructions at home: Lifestyle  Make changes that your doctor recommends.  Eat a healthy diet.  Lose weight if needed.  Avoid alcohol, medicines to help you relax, and some pain medicines.  Do  not use any products that contain nicotine or tobacco, such as cigarettes, e-cigarettes, and chewing tobacco. If you need help quitting, ask your doctor. General instructions  Take over-the-counter and prescription medicines only as told by your doctor.  If you were given a machine to use while you sleep, use it only as told by your doctor.  If you are having surgery, make sure to tell your doctor you have sleep apnea. You may need to bring your  device with you.  Keep all follow-up visits as told by your doctor. This is important. Contact a doctor if:  The machine that you were given to use during sleep bothers you or does not seem to be working.  You do not get better.  You get worse. Get help right away if:  Your chest hurts.  You have trouble breathing in enough air.  You have an uncomfortable feeling in your back, arms, or stomach.  You have trouble talking.  One side of your body feels weak.  A part of your face is hanging down. These symptoms may be an emergency. Do not wait to see if the symptoms will go away. Get medical help right away. Call your local emergency services (911 in the [place] Do not drive yourself to the hospital. Summary  This condition affects breathing during sleep.  The most common cause is a collapsed or blocked airway.  The goal of treatment is to help you breathe normally while you sleep. This information is not intended to replace advice given to you by your health care provider. Make sure you discuss any questions you have with your health care provider. Document Revised: 09/08/2018 Document Reviewed: 07/18/2018 Elsevier Patient Education  2021 [employer]  

## 2021-04-02 LAB — ECHOCARDIOGRAM COMPLETE
AR max vel: 2.43 cm2
AV Area VTI: 2.56 cm2
AV Area mean vel: 2.62 cm2
AV Mean grad: 4 mmHg
AV Peak grad: 9 mmHg
Ao pk vel: 1.5 m/s
Area-P 1/2: 2.39 cm2
Calc EF: 63.4 %
S' Lateral: 2.5 cm
Single Plane A2C EF: 57.8 %
Single Plane A4C EF: 69.5 %

## 2021-04-07 ENCOUNTER — Telehealth: Payer: Self-pay

## 2021-04-07 ENCOUNTER — Telehealth: Payer: Self-pay | Admitting: Medical

## 2021-04-07 NOTE — Telephone Encounter (Signed)
Patient son Shelley Mitchell calling to discuss necessity of upcoming visit.  Please call.

## 2021-04-07 NOTE — Telephone Encounter (Signed)
Spoke with patient and her son and gave them her Echo results.:  Normal systolic function, impaired relaxation, mild aortic valve calcification without any evidence for stenosis. Aortic valve calcification likely cause for cardiac murmur. Overall okay echocardiogram, no gross abnormalities.  Patient requested to cancel her follow up this week. Will route to [name] for new follow up time line recommendation.

## 2021-04-07 NOTE — Telephone Encounter (Signed)
Advised that he is not on release form but recommended they please keep appointment as scheduled. He expressed that transportation is an issue and therefore he wanted to make sure it was necessary. Advised that when she comes for appointment to request release of information form so that we can speak with him. He verbalized understanding of our conversation, agreement with plan, and had no further questions at this time.

## 2021-04-09 NOTE — Telephone Encounter (Signed)
Called and spoke with patients son [name] and informed him of Dr. Thereasa Solo recommendation as seen below. [name] verbalized understanding and agreed with plan. Put a recall in for the 1 year follow up.   Kate Sable, MD  Kavin Leech, RN Caller: Unspecified (2 days ago, 1:24 PM) Patient can follow-up in 1 year as needed.

## 2021-04-10 ENCOUNTER — Ambulatory Visit: Payer: BC Managed Care – PPO | Admitting: Medical

## 2021-04-10 ENCOUNTER — Ambulatory Visit: Payer: BC Managed Care – PPO | Admitting: Cardiology

## 2021-04-22 DIAGNOSIS — G4733 Obstructive sleep apnea (adult) (pediatric): Secondary | ICD-10-CM | POA: Insufficient documentation

## 2021-04-22 DIAGNOSIS — R0683 Snoring: Secondary | ICD-10-CM | POA: Insufficient documentation

## 2021-04-22 NOTE — Assessment & Plan Note (Addendum)
-   Referred to [facility] for sleep consult by cardiology to r/o sleep apnea. Patient has symptoms of loud snoring and daytime sleepiness. She frequently wakes up at night. She gets on average 9-10 hours of sleep each night. Her weight is up 10lbs over the last 2 years. Needs HST to evaluate for underlying obstructive sleep apnea. We discussed risk of untreated sleep apnea including cardiac arrhthymias, stroke, pulm HTN or DM. We reviewed treatment options including weight loss, side sleeping position, oral appliance, CPAP therapy or referral to ENT for possible surgical options. FU in 4-6 weeks to review sleep study results.

## 2021-04-30 ENCOUNTER — Ambulatory Visit: Payer: BC Managed Care – PPO | Admitting: Primary Care

## 2021-05-08 ENCOUNTER — Telehealth: Payer: Self-pay | Admitting: Primary Care

## 2021-05-08 NOTE — Telephone Encounter (Signed)
She does not need to keep apt with me if she is wishing to cancel HST. I still recommend she had sleep study d/t moderate suspicion she may have sleep apnea d/t frequent snoring, daytime fatigue and high amount of nocturnal awakenings. Cardiology wanted to rule out sleep apnea. I will forward to [name] to notify him that sleep study has been cancelled d.t patient request.

## 2021-05-08 NOTE — Telephone Encounter (Signed)
I called [name] today trying to get the home sleep study scheduled.  She stated that she would have to get someone in the family to bring her for the appt and would have to call me back.  [name] just called me back and she stated that no one could bring her over here and she doesn't feel that she has sleep apnea.  She stated that her heart test was fine and really didn't think that she needed to do the sleep study and wanted me to CXL it.  I told her that someone else could pick up the machine for her and bring it back but it seems like she doesn't have anyone to help her out.  Does she still need to keep her appt already scheduled with Beth?

## 2021-05-15 ENCOUNTER — Ambulatory Visit
Admission: RE | Admit: 2021-05-15 | Discharge: 2021-05-15 | Disposition: A | Discharge status: Home / Self Care | Payer: BC Managed Care – PPO | Source: Ambulatory Visit | Attending: Obstetrics & Gynecology | Admitting: Obstetrics & Gynecology

## 2021-05-15 ENCOUNTER — Other Ambulatory Visit: Payer: Self-pay

## 2021-05-15 DIAGNOSIS — Z1231 Encounter for screening mammogram for malignant neoplasm of breast: Secondary | ICD-10-CM

## 2021-05-15 IMAGING — MG MM DIGITAL SCREENING BILAT W/ TOMO AND CAD
6 of 12 series · 6 of 36 positions shown · non-contrast
Comparison: Previous exam(s).

CLINICAL DATA: Screening.

EXAM:
DIGITAL SCREENING BILATERAL MAMMOGRAM WITH TOMOSYNTHESIS AND CAD
TECHNIQUE: Bilateral screening digital craniocaudal and mediolateral oblique
mammograms were obtained. Bilateral screening digital breast
tomosynthesis was performed. The images were evaluated with
computer-aided detection.

[L CC synth-2D (1 of 2)]
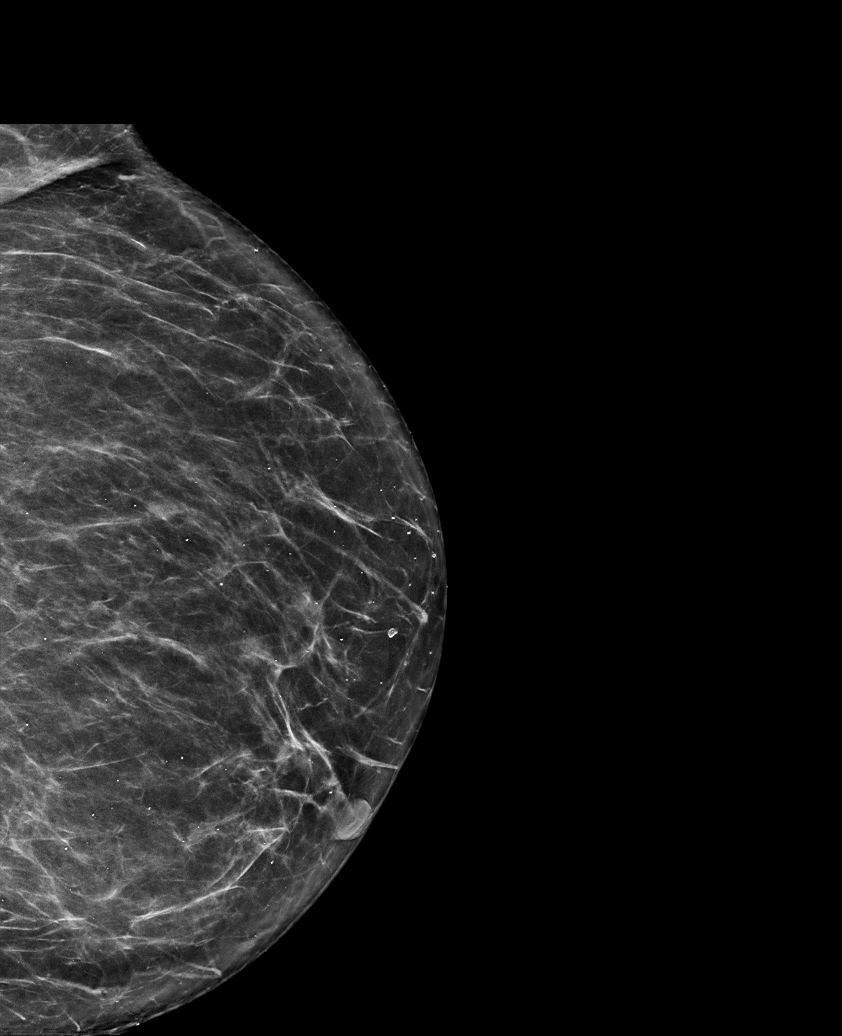

[R CC synth-2D (1 of 2)]
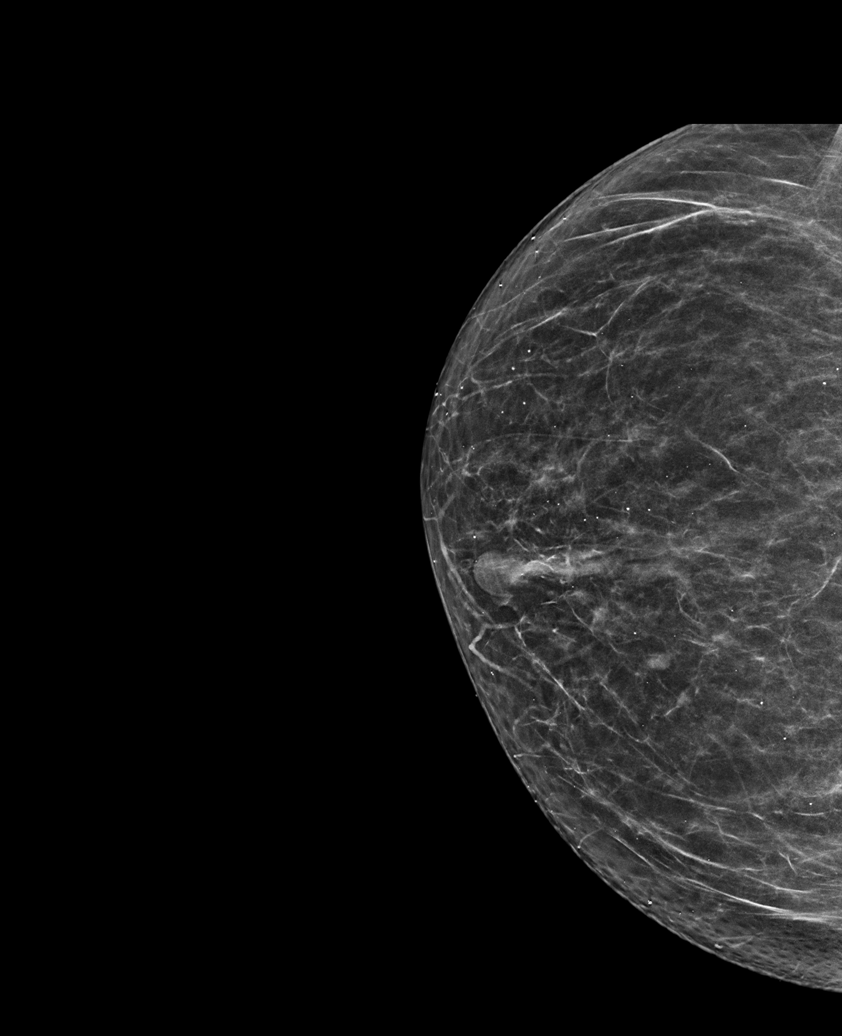

[R MLO synth-2D]
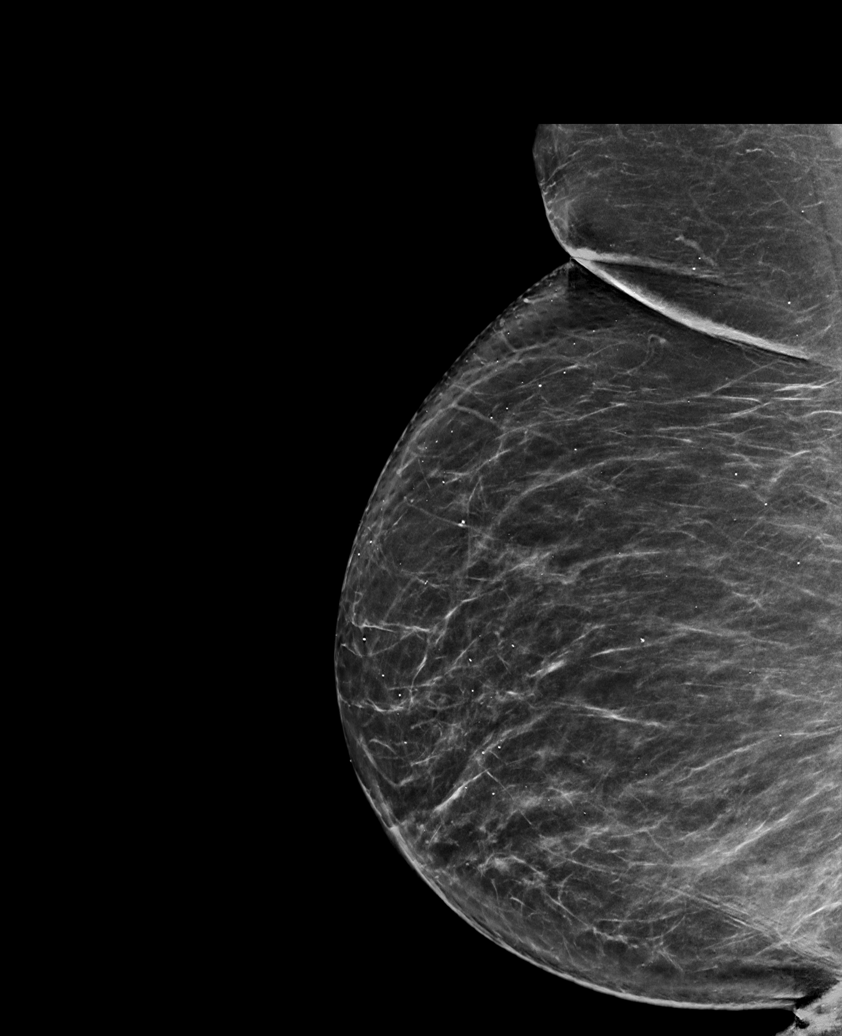

[L CC synth-2D (2 of 2)]
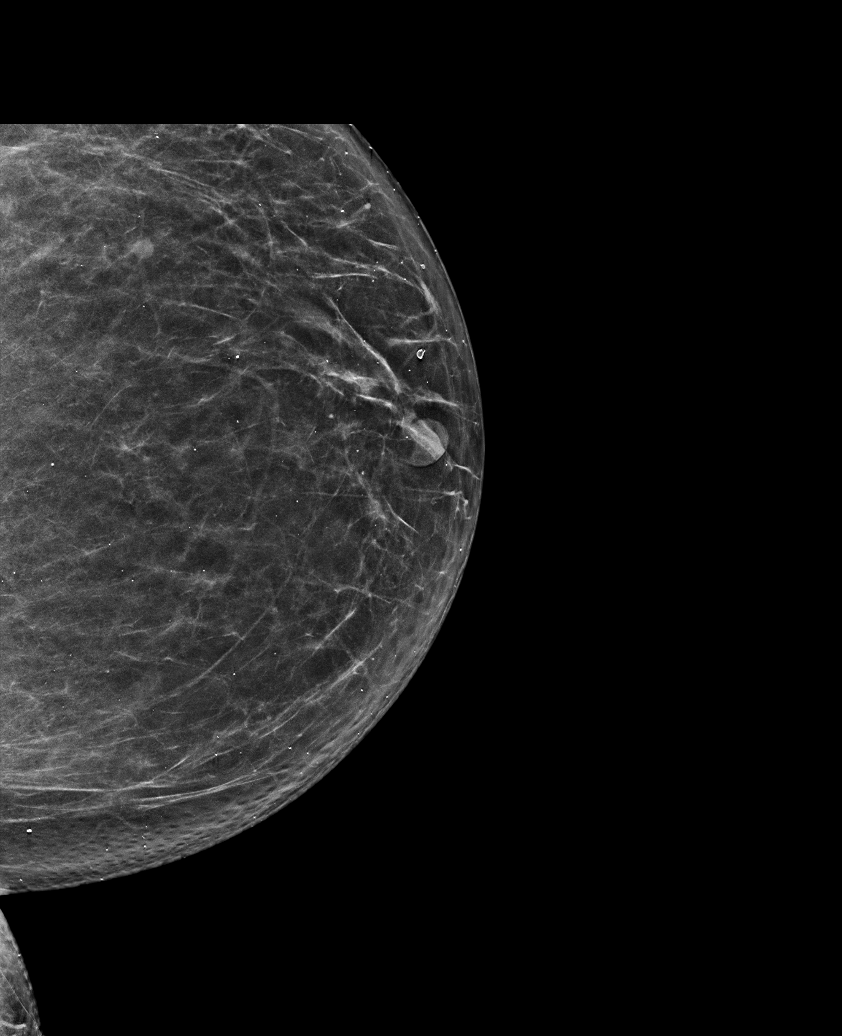

[R CC synth-2D (2 of 2)]
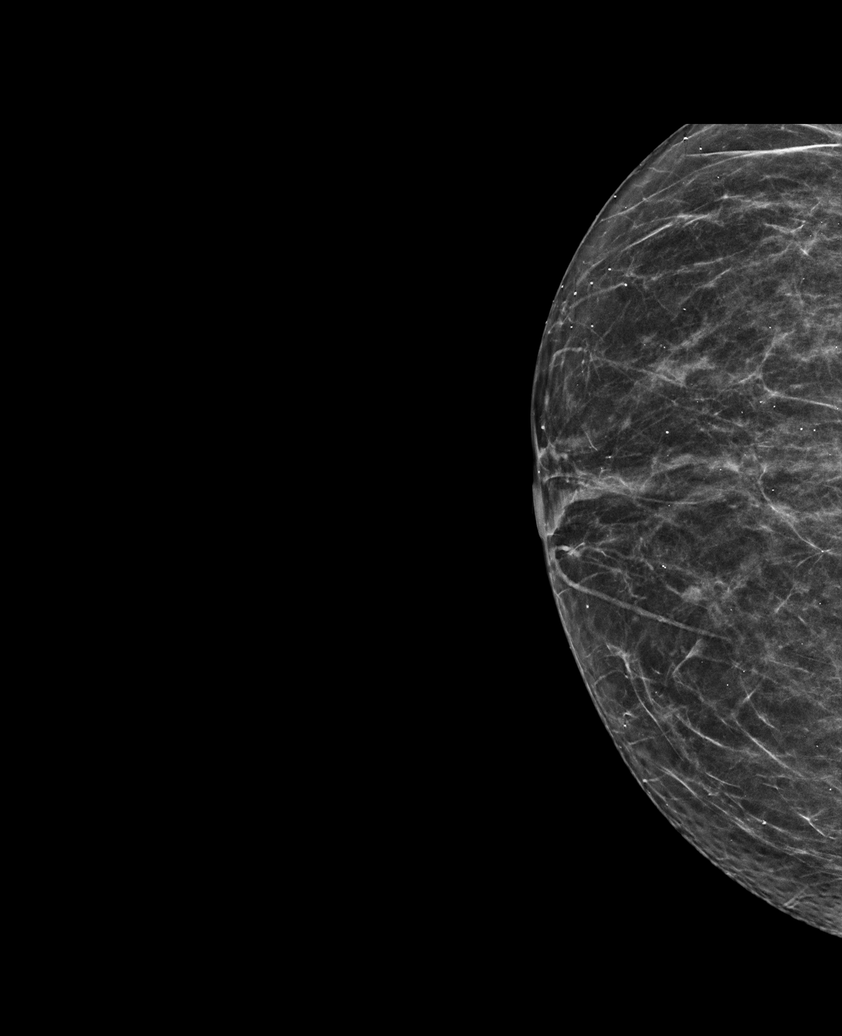

[L MLO synth-2D]
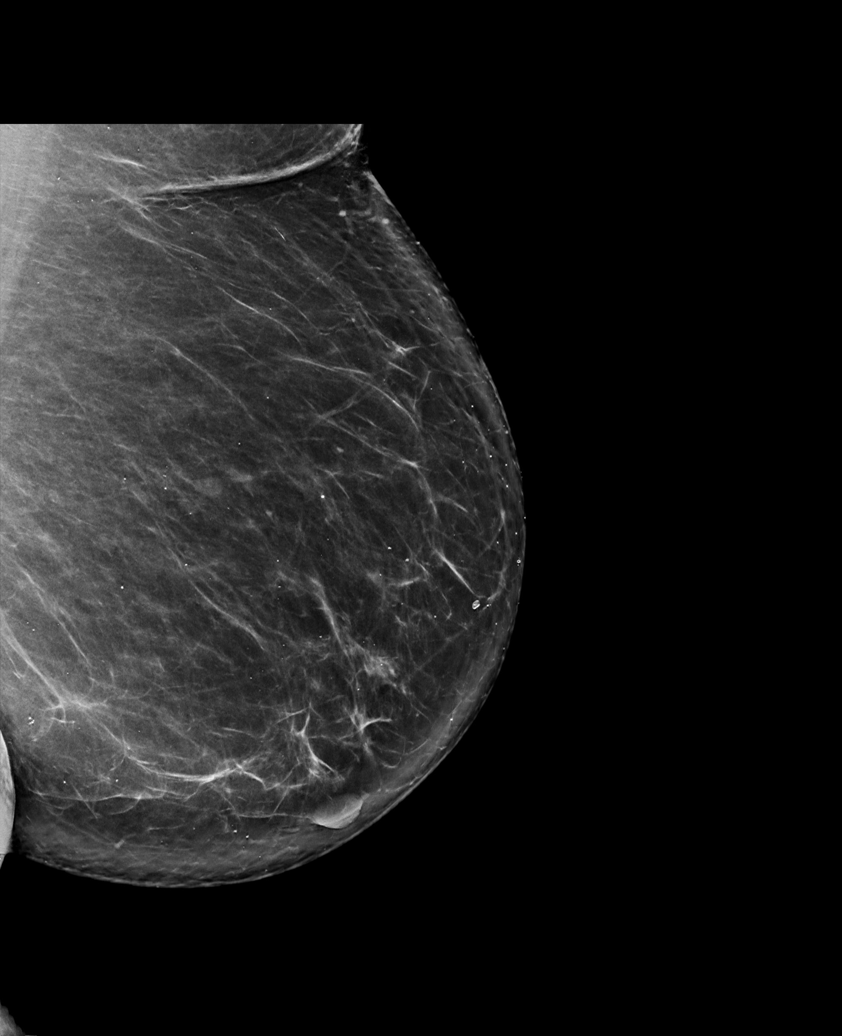

[6 of 36 positions shown; findings below may reference images not displayed]

ACR Breast Density Category b: There are scattered areas of
fibroglandular density.
FINDINGS: In the right breast, a possible mass warrants further evaluation. In
the left breast, a possible mass warrants further evaluation.
IMPRESSION: Further evaluation is suggested for possible masses in the right and
left breasts.

RECOMMENDATION:
Ultrasound of the right and left breasts.  (Code:[GJ])

The patient will be contacted regarding the findings, and additional
imaging will be scheduled.

BI-RADS CATEGORY  0: Incomplete. Need additional imaging evaluation
and/or prior mammograms for comparison.

## 2021-05-19 ENCOUNTER — Telehealth: Payer: Self-pay

## 2021-05-19 ENCOUNTER — Other Ambulatory Visit: Payer: Self-pay

## 2021-05-19 ENCOUNTER — Encounter: Payer: BC Managed Care – PPO | Admitting: Primary Care

## 2021-05-19 ENCOUNTER — Other Ambulatory Visit: Payer: Self-pay | Admitting: Obstetrics & Gynecology

## 2021-05-19 DIAGNOSIS — R928 Other abnormal and inconclusive findings on diagnostic imaging of breast: Secondary | ICD-10-CM

## 2021-05-19 NOTE — Telephone Encounter (Signed)
Spoke to pt today in regards to needing to reschedule her appt today with Derl Barrow. Pt agreed to proceeding with the Home sleep study, per Rodena Piety the [facility] she will call and get her scheduled today on 05/19/2021

## 2021-05-19 NOTE — Progress Notes (Signed)
 Err

## 2021-05-29 NOTE — Telephone Encounter (Signed)
I have spoke with Shelley Mitchell and she has been scheduled to pick up HST machine on 06/12/21 @ 11:00am

## 2021-06-09 ENCOUNTER — Telehealth: Payer: Self-pay | Admitting: Primary Care

## 2021-06-09 NOTE — Telephone Encounter (Signed)
I have spoke with Shelley Mitchell and her son Shelley Mitchell. He will be the person picking up the HST machine on 06/12/21 @ 9:00am

## 2021-06-10 ENCOUNTER — Ambulatory Visit
Admission: RE | Admit: 2021-06-10 | Discharge: 2021-06-10 | Disposition: A | Discharge status: Home / Self Care | Payer: BC Managed Care – PPO | Source: Ambulatory Visit | Attending: Obstetrics & Gynecology | Admitting: Obstetrics & Gynecology

## 2021-06-10 ENCOUNTER — Other Ambulatory Visit: Payer: Self-pay

## 2021-06-10 DIAGNOSIS — R928 Other abnormal and inconclusive findings on diagnostic imaging of breast: Secondary | ICD-10-CM

## 2021-06-10 IMAGING — US US BREAST*R* LIMITED INC AXILLA
1 series · 5 of 5 positions shown · non-contrast
Comparison: Previous exam(s).

CLINICAL DATA: 80-year-old female recalled from screening mammogram
dated [DATE] for bilateral breast masses. The patient is on
hormone replacement therapy.

EXAM:
ULTRASOUND OF THE BILATERAL BREAST

[Series 1: us breast*right* limited inc axilla · 0.06mm/px · 5 of 5 slices shown]
[im 1/5]
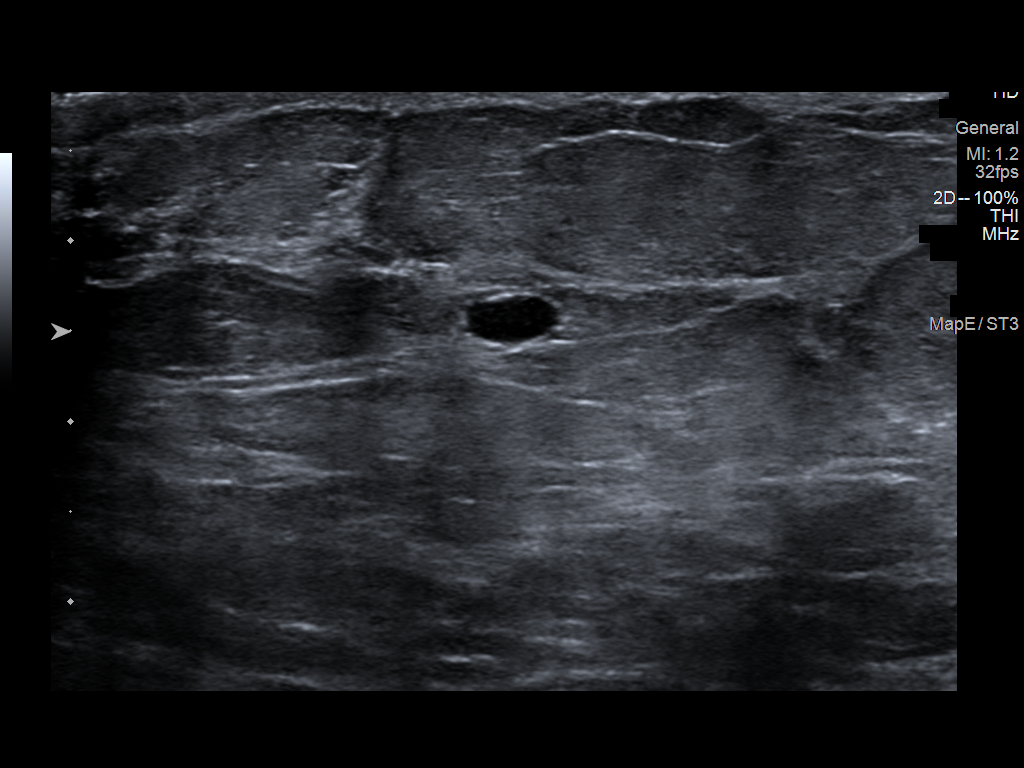
[im 2/5]
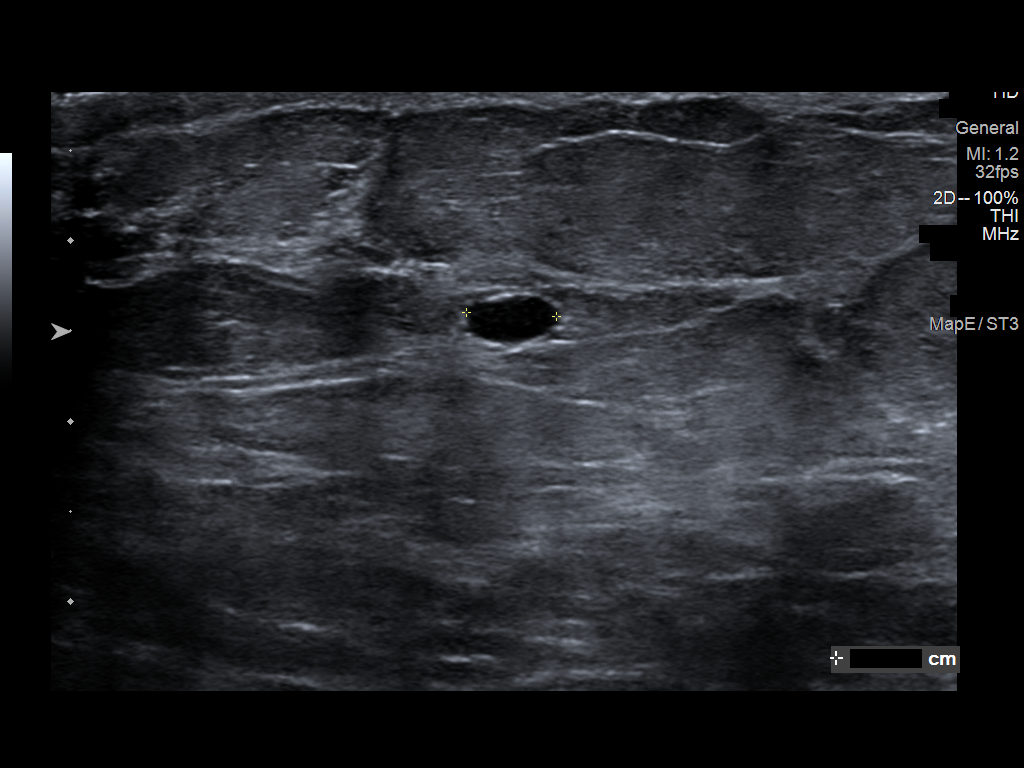
[im 3/5]
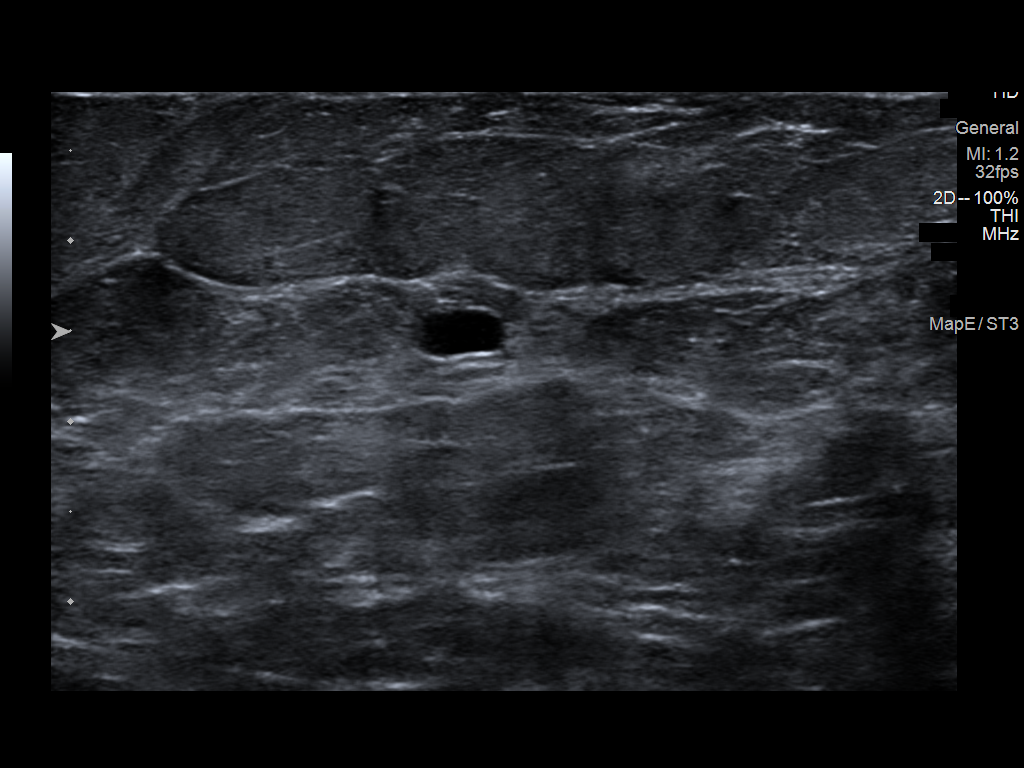
[im 4/5]
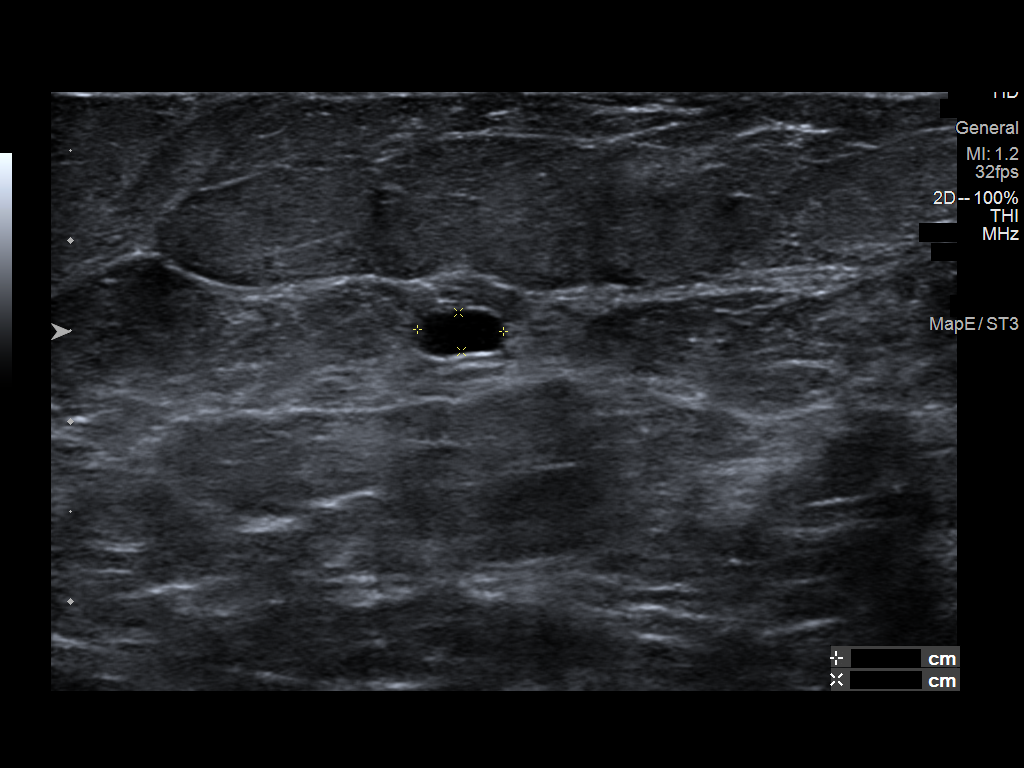
[im 5/5]
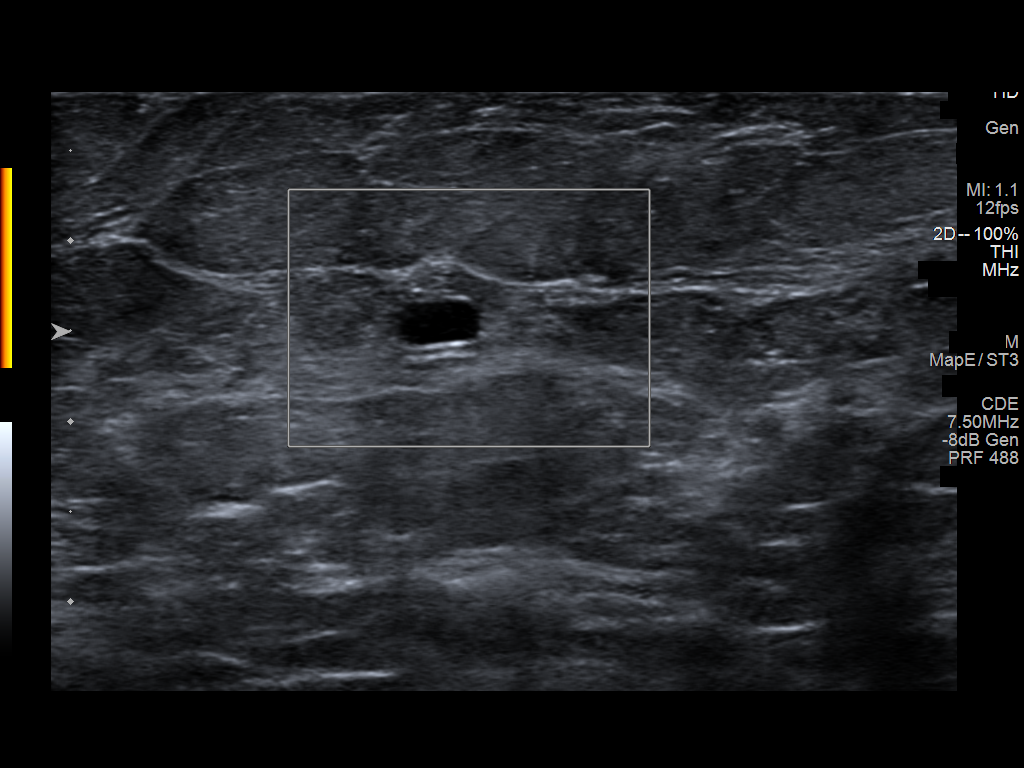

[5 of 5 positions shown; findings below may reference images not displayed]

FINDINGS: Targeted ultrasound is performed, showing stable, circumscribed
anechoic masses at the 3 o'clock position 1 cm from the nipple on
the right and [DATE] position 6 cm from the nipple on the right left.
They measure 5 x 5 x 2 mm and 5 x 4 x 3 mm respectively. These
correlate well with the mammographic finding and are consistent with
benign cysts.
IMPRESSION: Benign cyst corresponding with the screening mammographic findings.
No further imaging follow-up required.

RECOMMENDATION:
Screening mammogram in one year as clinically
indicated.(Code:[SU])

I have discussed the findings and recommendations with the patient.
If applicable, a reminder letter will be sent to the patient
regarding the next appointment.

BI-RADS CATEGORY  2: Benign.

## 2021-06-10 IMAGING — US US BREAST*L* LIMITED INC AXILLA
1 series · 8 of 8 positions shown · non-contrast
Comparison: Previous exam(s).

CLINICAL DATA: 80-year-old female recalled from screening mammogram
dated [DATE] for bilateral breast masses. The patient is on
hormone replacement therapy.

EXAM:
ULTRASOUND OF THE BILATERAL BREAST

[Series 1: us breast*left* limited inc axilla · 0.06mm/px · 8 of 8 slices shown]
[im 1/8]
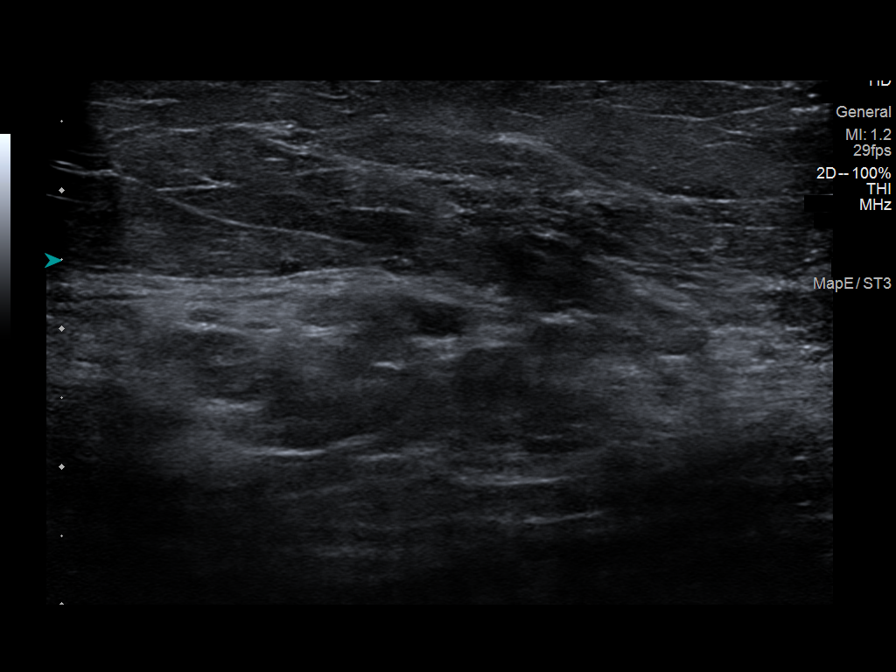
[im 2/8]
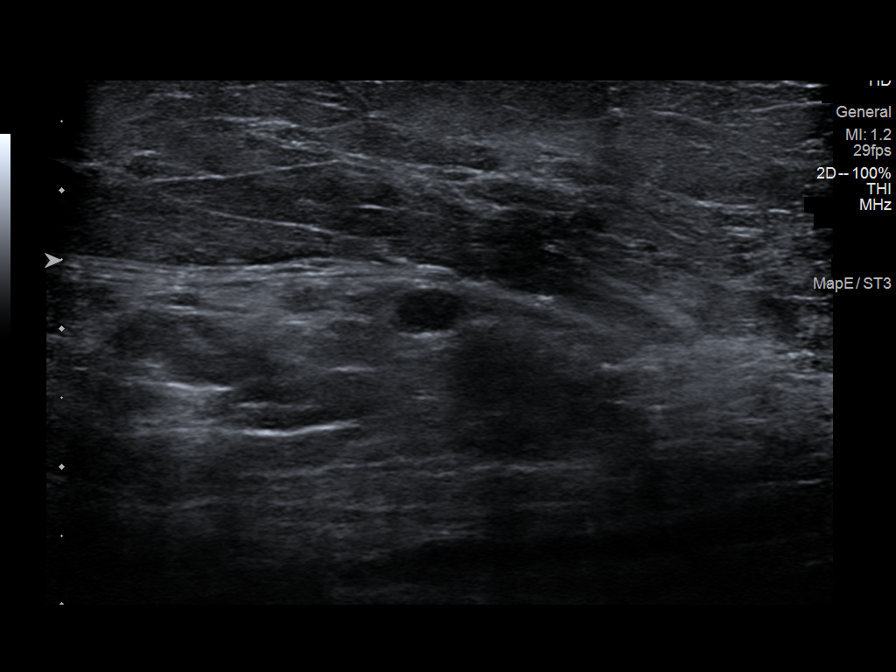
[im 3/8]
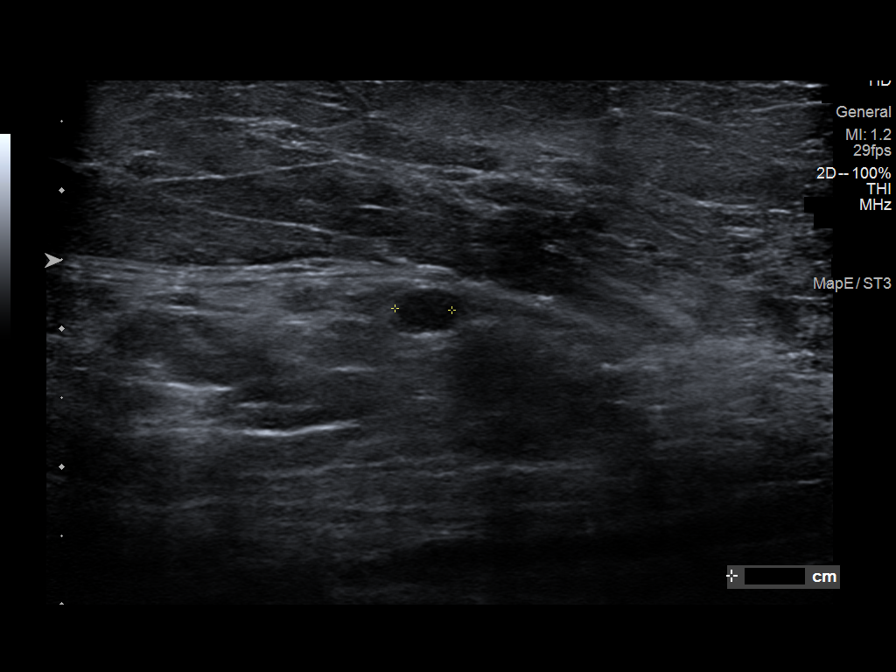
[im 4/8]
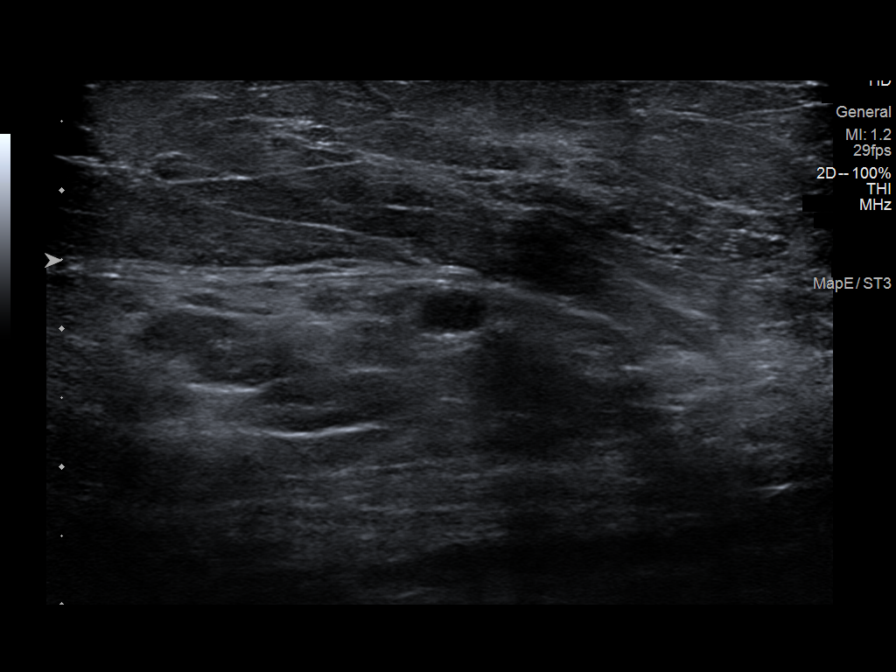
[im 5/8]
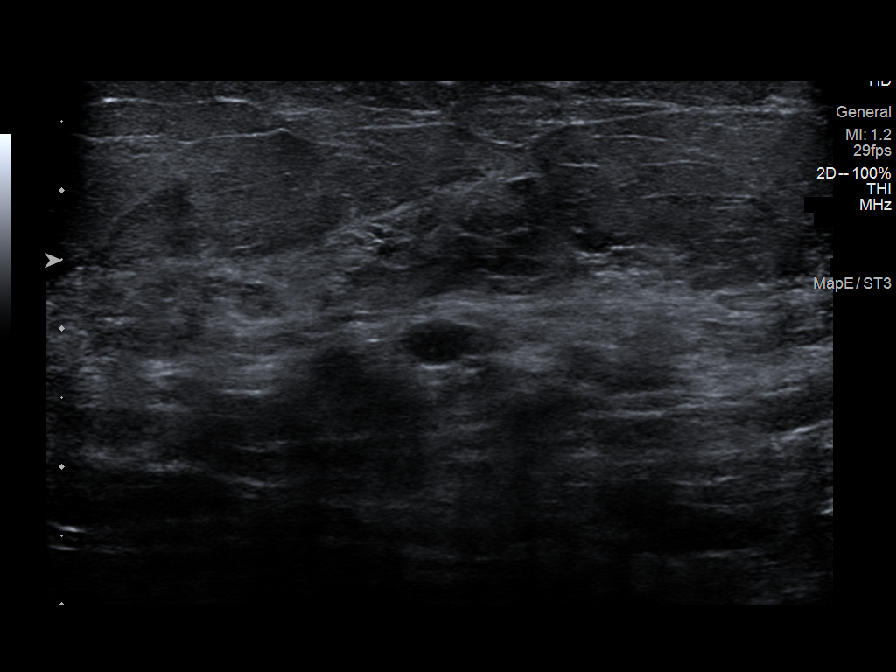
[im 6/8]
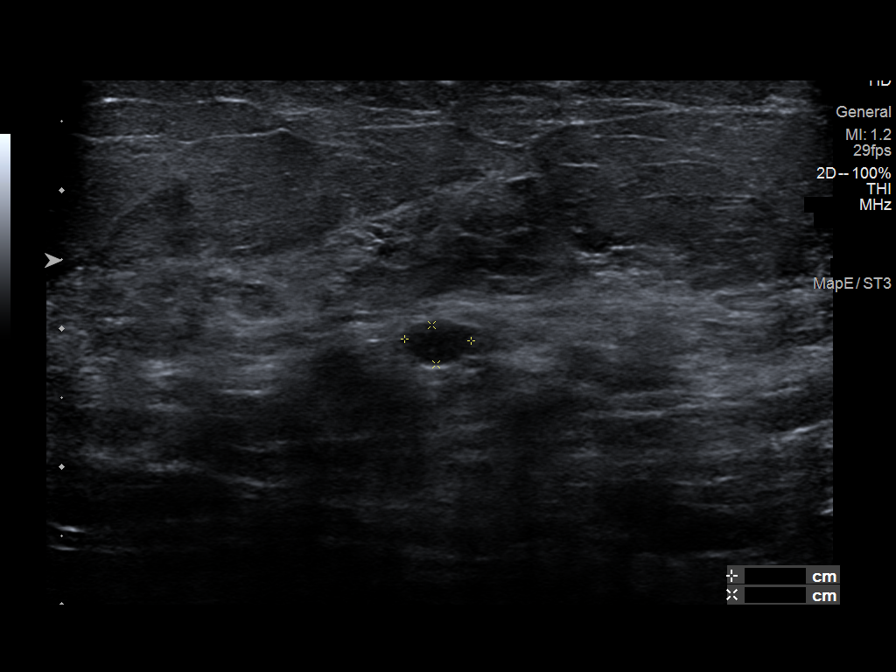
[im 7/8]
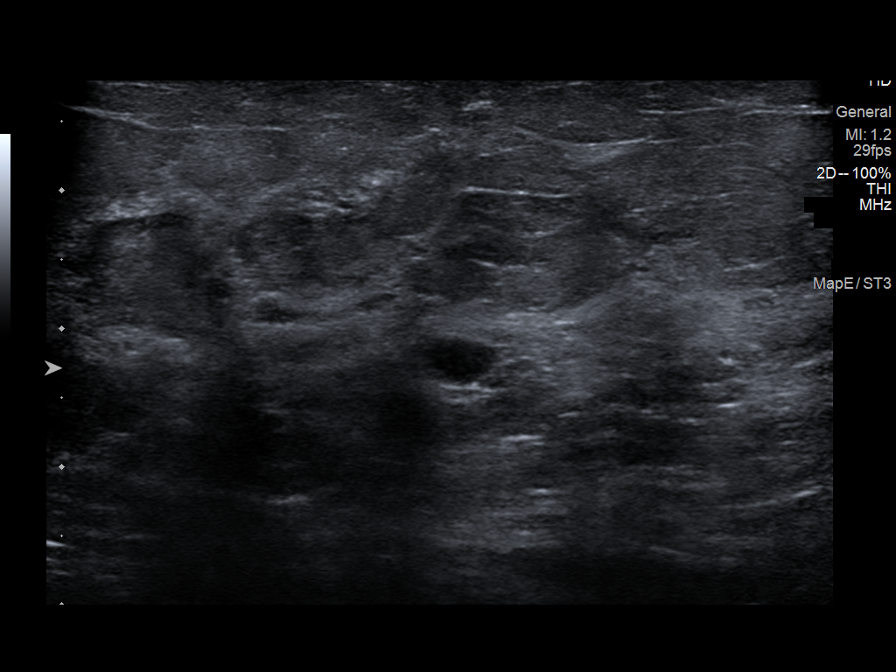
[im 8/8]
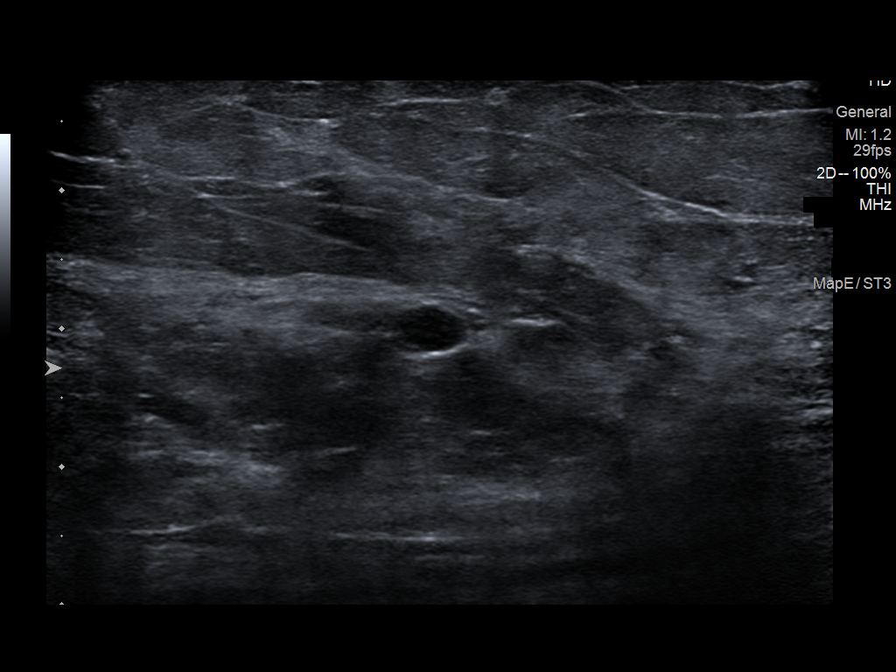

[8 of 8 positions shown; findings below may reference images not displayed]

FINDINGS: Targeted ultrasound is performed, showing stable, circumscribed
anechoic masses at the 3 o'clock position 1 cm from the nipple on
the right and [DATE] position 6 cm from the nipple on the right left.
They measure 5 x 5 x 2 mm and 5 x 4 x 3 mm respectively. These
correlate well with the mammographic finding and are consistent with
benign cysts.
IMPRESSION: Benign cyst corresponding with the screening mammographic findings.
No further imaging follow-up required.

RECOMMENDATION:
Screening mammogram in one year as clinically
indicated.(Code:[SU])

I have discussed the findings and recommendations with the patient.
If applicable, a reminder letter will be sent to the patient
regarding the next appointment.

BI-RADS CATEGORY  2: Benign.

## 2021-06-12 ENCOUNTER — Ambulatory Visit: Payer: BC Managed Care – PPO

## 2021-06-12 ENCOUNTER — Other Ambulatory Visit: Payer: Self-pay

## 2021-06-12 DIAGNOSIS — R0683 Snoring: Secondary | ICD-10-CM

## 2021-07-04 ENCOUNTER — Other Ambulatory Visit: Payer: Self-pay

## 2021-07-04 ENCOUNTER — Encounter (HOSPITAL_COMMUNITY): Payer: Self-pay | Admitting: Emergency Medicine

## 2021-07-04 ENCOUNTER — Emergency Department (HOSPITAL_COMMUNITY)
Admission: EM | Admit: 2021-07-04 | Discharge: 2021-07-04 | Disposition: A | Discharge status: Home / Self Care | Payer: Medicare HMO | Attending: Emergency Medicine | Admitting: Emergency Medicine

## 2021-07-04 ENCOUNTER — Emergency Department (HOSPITAL_COMMUNITY): Payer: Medicare HMO

## 2021-07-04 DIAGNOSIS — W06XXXA Fall from bed, initial encounter: Secondary | ICD-10-CM | POA: Insufficient documentation

## 2021-07-04 DIAGNOSIS — S0990XA Unspecified injury of head, initial encounter: Secondary | ICD-10-CM | POA: Diagnosis present

## 2021-07-04 DIAGNOSIS — Z7982 Long term (current) use of aspirin: Secondary | ICD-10-CM | POA: Diagnosis not present

## 2021-07-04 DIAGNOSIS — I10 Essential (primary) hypertension: Secondary | ICD-10-CM | POA: Insufficient documentation

## 2021-07-04 DIAGNOSIS — Z96653 Presence of artificial knee joint, bilateral: Secondary | ICD-10-CM | POA: Insufficient documentation

## 2021-07-04 DIAGNOSIS — S0231XA Fracture of orbital floor, right side, initial encounter for closed fracture: Secondary | ICD-10-CM | POA: Diagnosis not present

## 2021-07-04 DIAGNOSIS — Z79899 Other long term (current) drug therapy: Secondary | ICD-10-CM | POA: Insufficient documentation

## 2021-07-04 DIAGNOSIS — R040 Epistaxis: Secondary | ICD-10-CM | POA: Diagnosis not present

## 2021-07-04 DIAGNOSIS — S0285XA Fracture of orbit, unspecified, initial encounter for closed fracture: Secondary | ICD-10-CM

## 2021-07-04 DIAGNOSIS — W19XXXA Unspecified fall, initial encounter: Secondary | ICD-10-CM

## 2021-07-04 IMAGING — CT CT MAXILLOFACIAL W/O CM
3 series · 16 of 47 positions shown, 19 images · non-contrast
Comparison: Head CT earlier this day.

CLINICAL DATA: Facial trauma

EXAM:
CT MAXILLOFACIAL WITHOUT CONTRAST
TECHNIQUE: Multidetector CT imaging of the maxillofacial structures was
performed. Multiplanar CT image reconstructions were also generated.

[Series 2: max soft · axial · 0.36mm/px · z∈[-94,+56]mm · 10 of 89 slices shown, 13 images]
[im 7/89  brain]
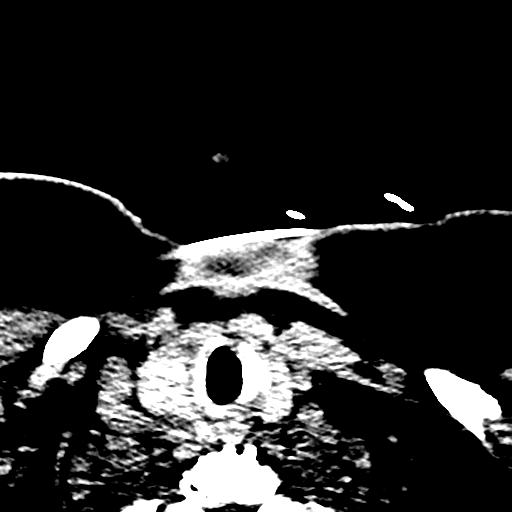
[im 7/89  bone]
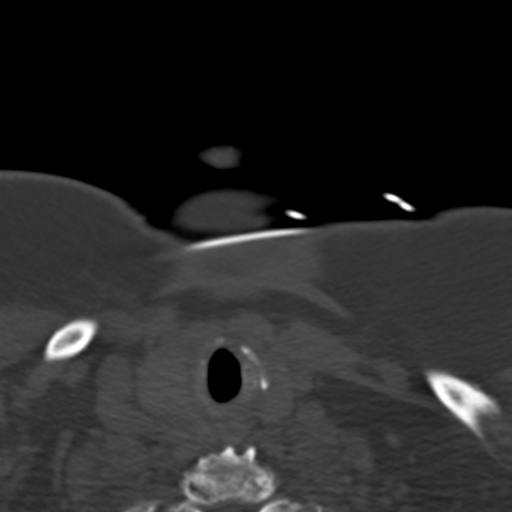
[im 16/89  bone]
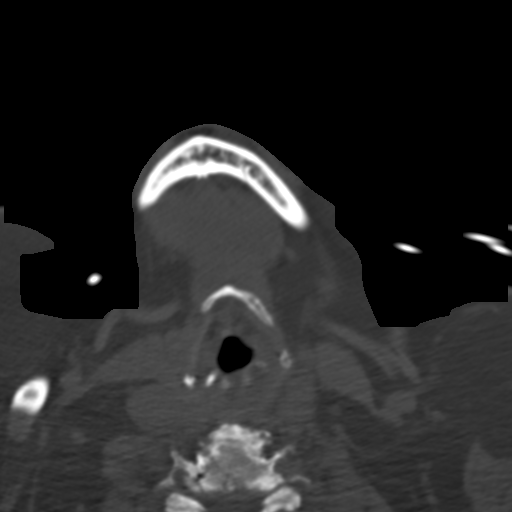
[im 25/89  bone]
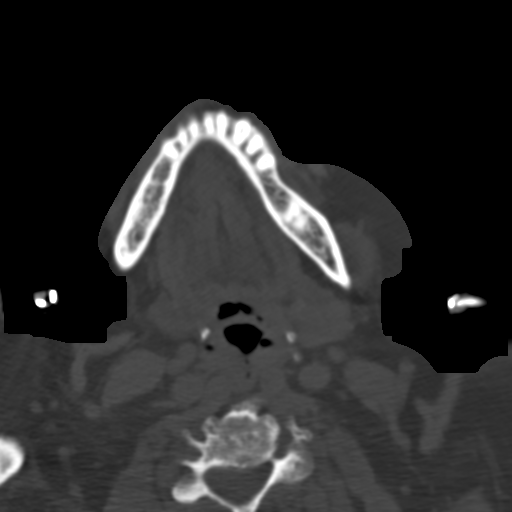
[im 31/89  bone]
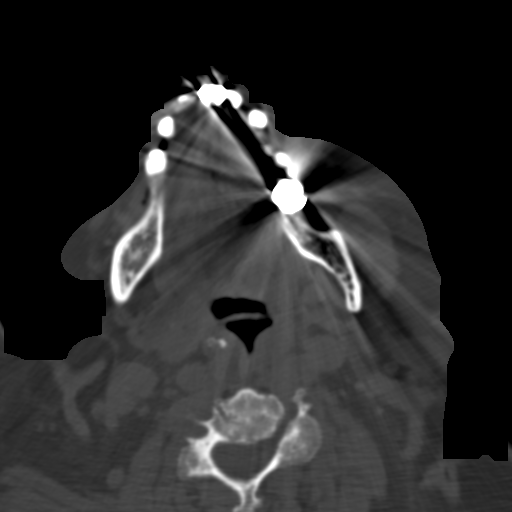
[im 40/89  brain]
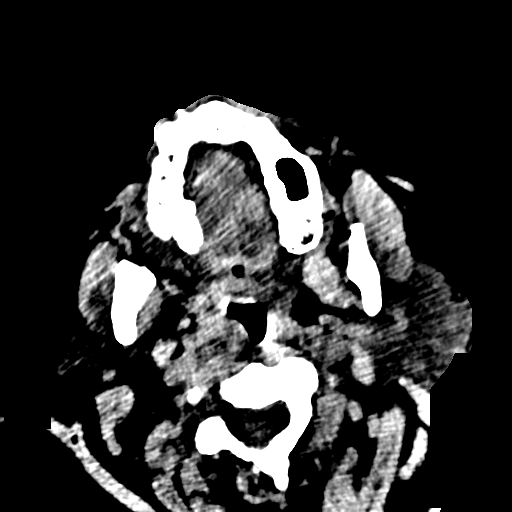
[im 40/89  bone]
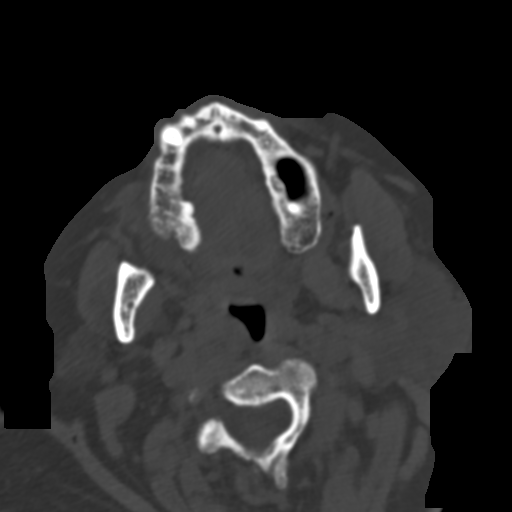
[im 49/89  bone]
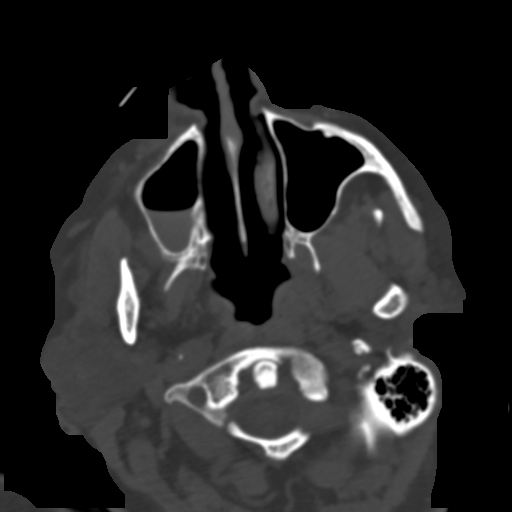
[im 58/89  bone]
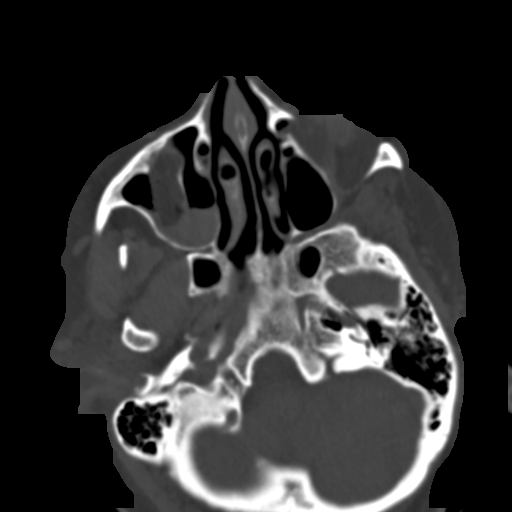
[im 67/89  bone]
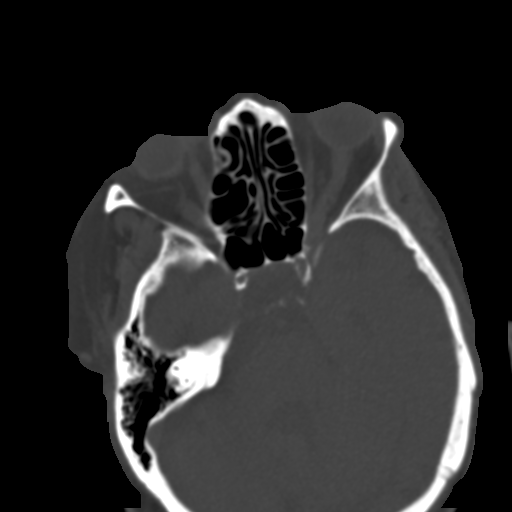
[im 73/89  brain]
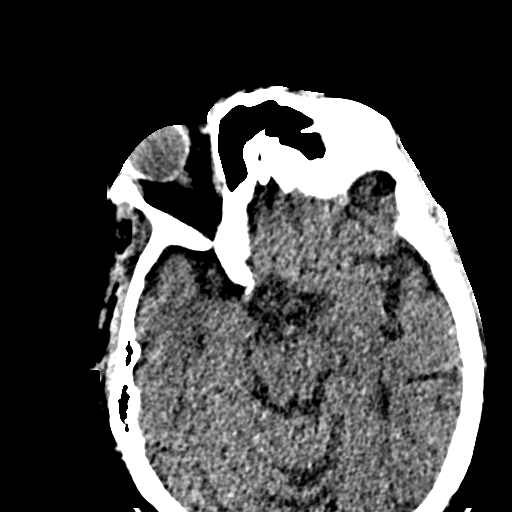
[im 73/89  bone]
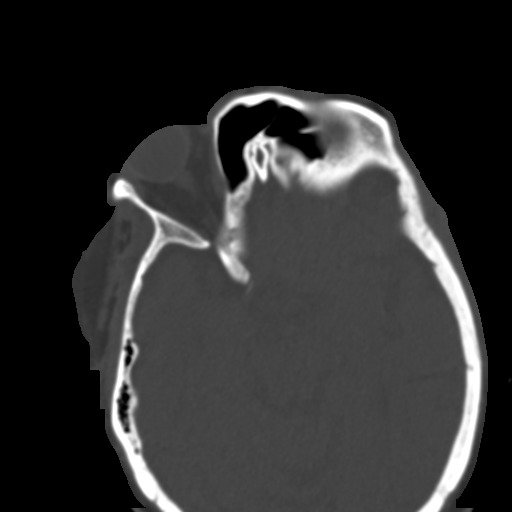
[im 82/89  bone]
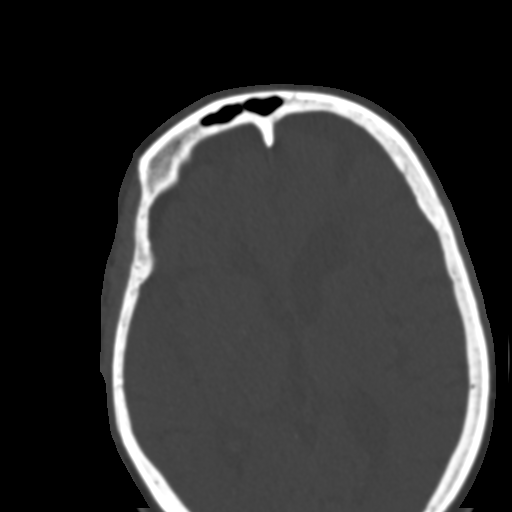

[Series 6: coronal soft · coronal · 0.35mm/px · 3 of 71 slices shown]
[im 24/71  bone]
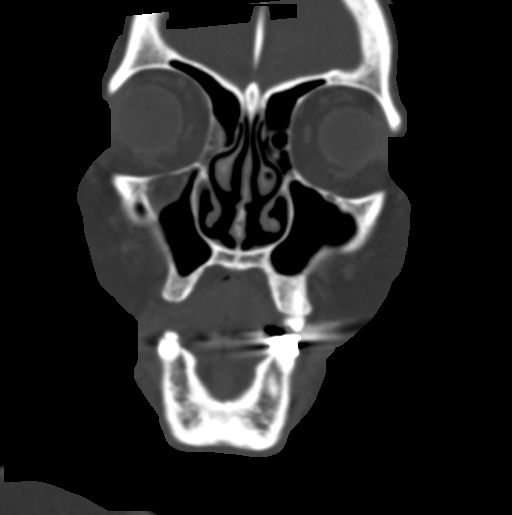
[im 32/71  bone]
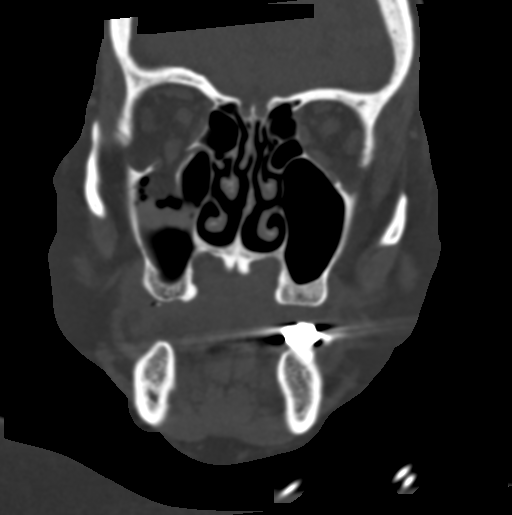
[im 39/71  bone]
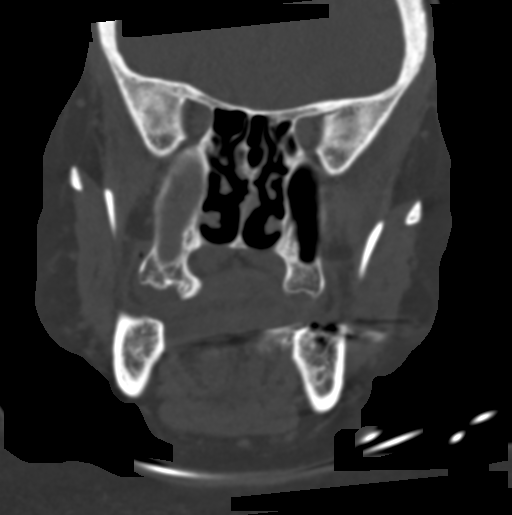

[Series 7: sagittal soft · sagittal · 0.36mm/px · 3 of 84 slices shown]
[im 28/84  bone]
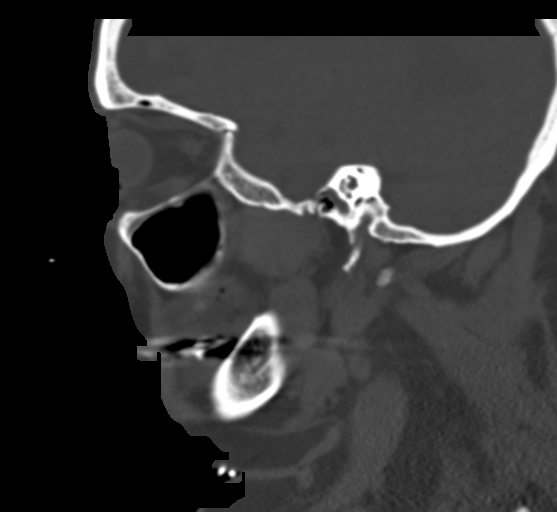
[im 42/84  bone]
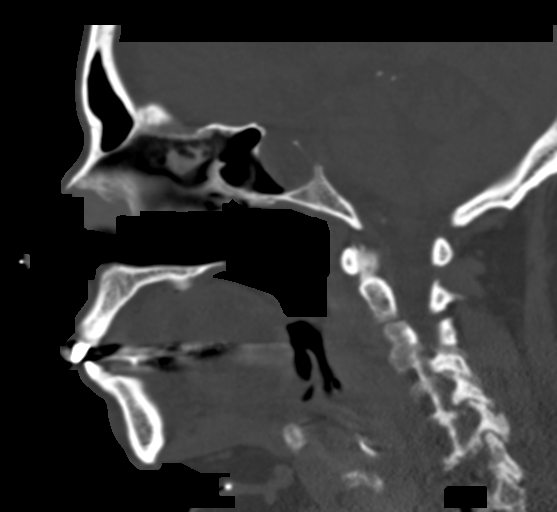
[im 56/84  bone]
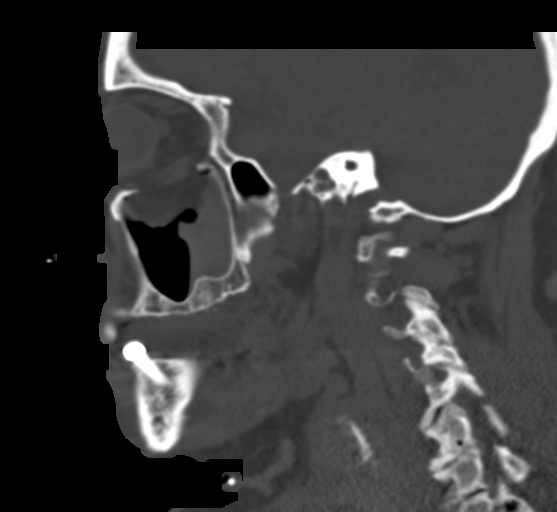

[16 of 47 positions shown; findings below may reference images not displayed]

FINDINGS: Osseous: No fracture of the zygomatic arches, nasal bone, or
mandibles. Temporomandibular joints are congruent. Many absent
teeth. Nasal septum is midline.

Orbits: Displaced right inferior orbital floor fracture. Fracture
fragments are displaced by 7 mm. The inferior rectus muscle
approaches the fracture defect and is minimally thickened. There is
herniation of intraorbital fat. No globe injury. No fracture of the
left orbit or left globe injury.

Sinuses: Left maxillary hemosinus related to orbital fracture. No
other sinus fracture. Paranasal sinuses are otherwise clear.

Soft tissues: Mild right periorbital soft tissue edema.

Limited intracranial: Assessed on concurrent head CT, reported
separately
IMPRESSION: Displaced right inferior orbital floor fracture with herniation of
intraorbital fat. The inferior rectus muscle approaches the fracture
defect and is minimally thickened.

## 2021-07-04 IMAGING — CT CT HEAD W/O CM
4 series · 15 of 47 positions shown, 17 images · non-contrast
Comparison: None.

CLINICAL DATA: Head trauma, fall this morning getting out of bed.
Abrasions to face.

EXAM:
CT HEAD WITHOUT CONTRAST
TECHNIQUE: Contiguous axial images were obtained from the base of the skull
through the vertex without intravenous contrast.

[Series 2: head w o · axial · 0.43mm/px · z∈[+4,+114]mm · 7 of 30 slices shown, 9 images]
[im 4/30  brain]
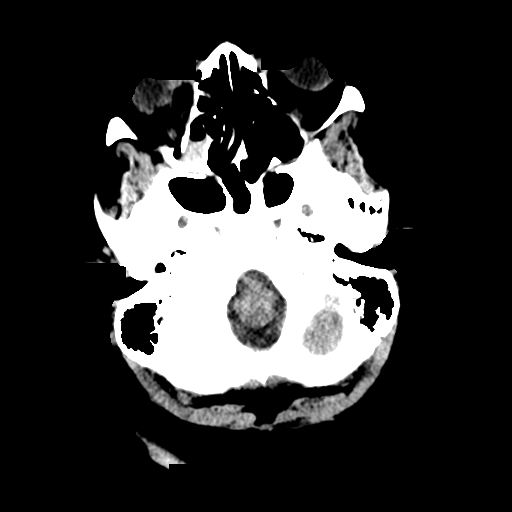
[im 4/30  bone]
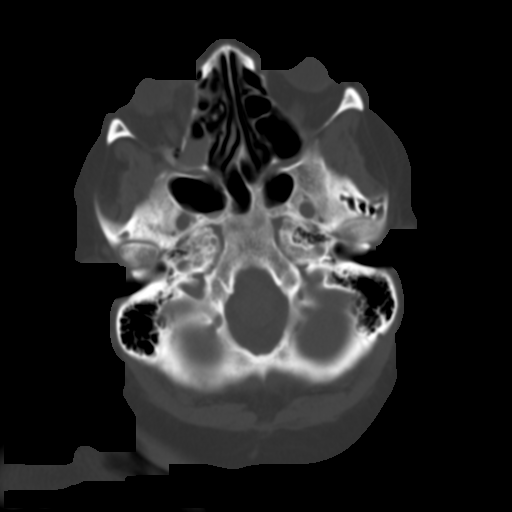
[im 8/30  brain]
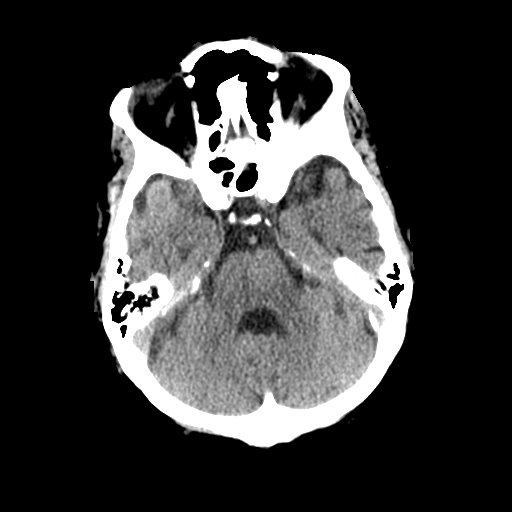
[im 11/30  brain]
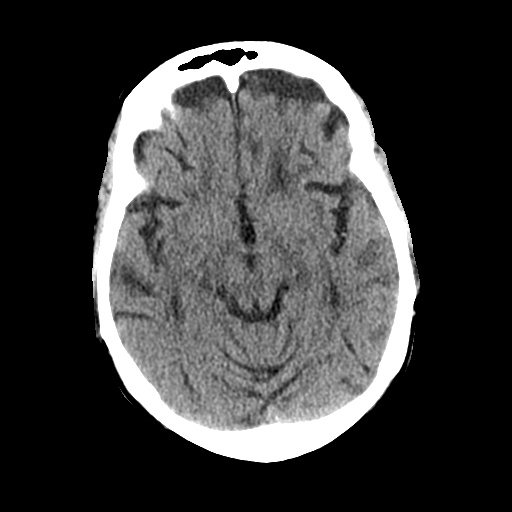
[im 15/30  brain]
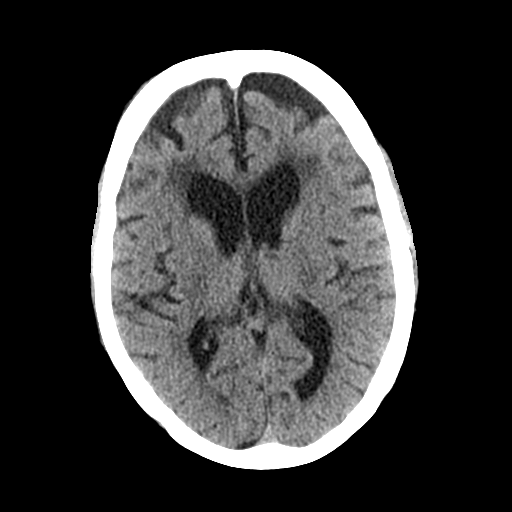
[im 19/30  brain]
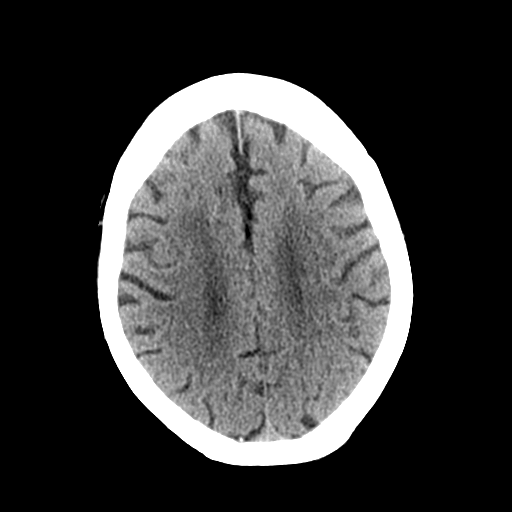
[im 19/30  bone]
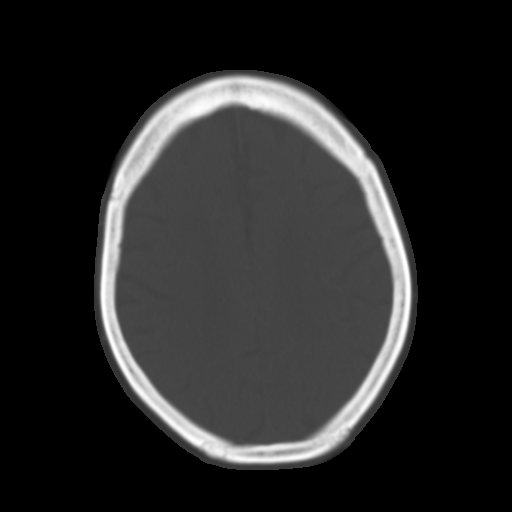
[im 22/30  brain]
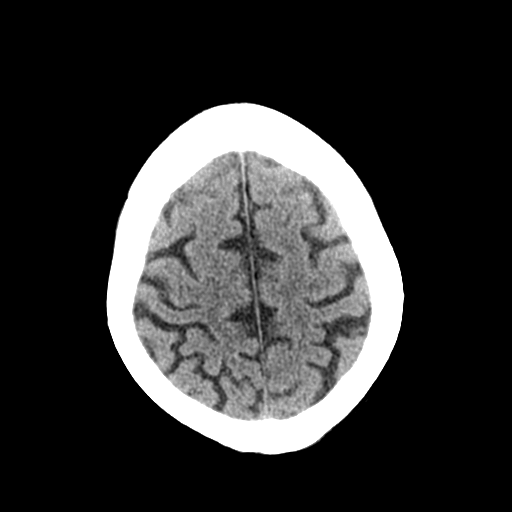
[im 26/30  brain]
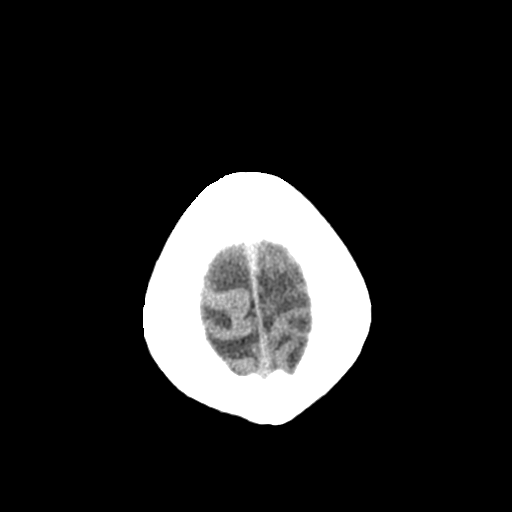

[Series 3: head bone · axial · 0.43mm/px · z∈[+3,+17]mm · 2 of 75 slices shown]
[im 8/75  bone]
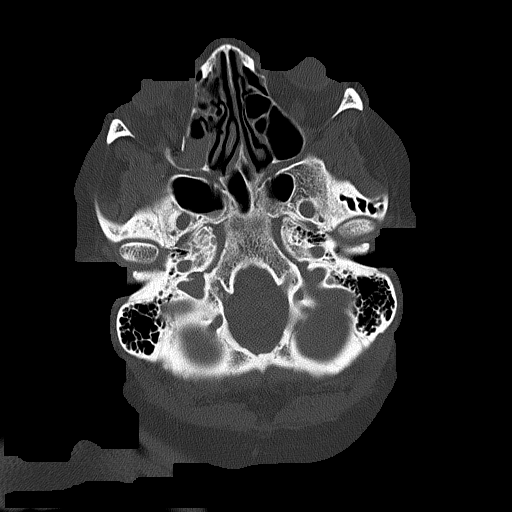
[im 15/75  bone]
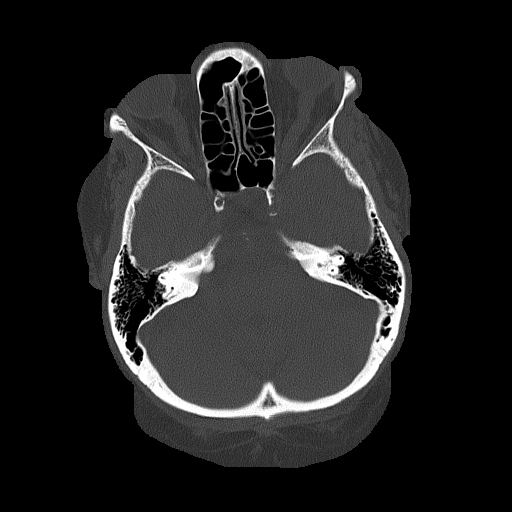

[Series 4: coronal soft · coronal · 0.29mm/px · 3 of 65 slices shown]
[im 22/65  brain]
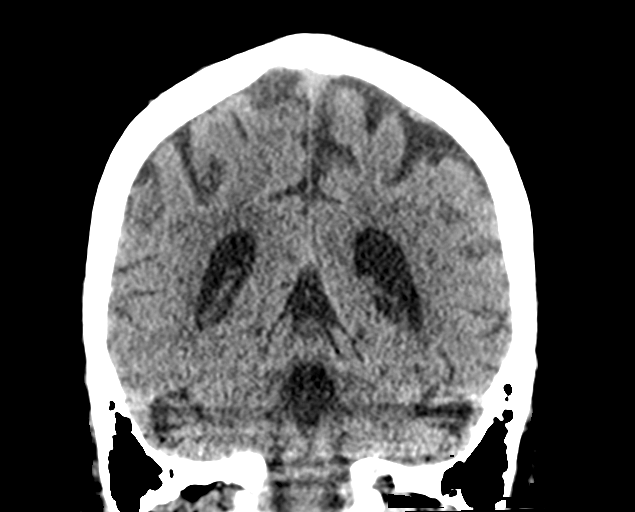
[im 29/65  brain]
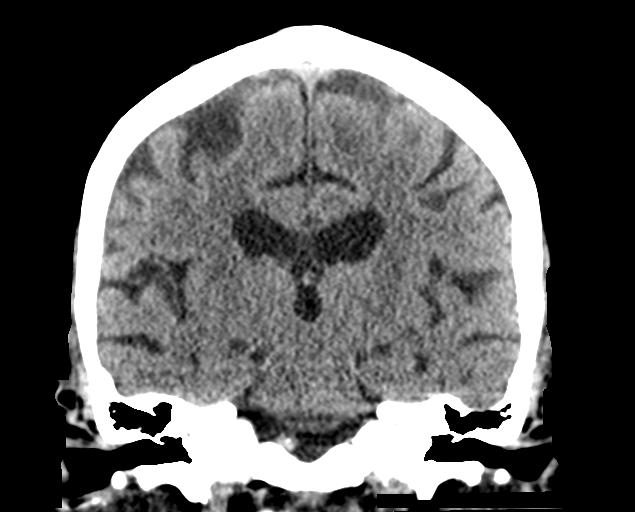
[im 36/65  brain]
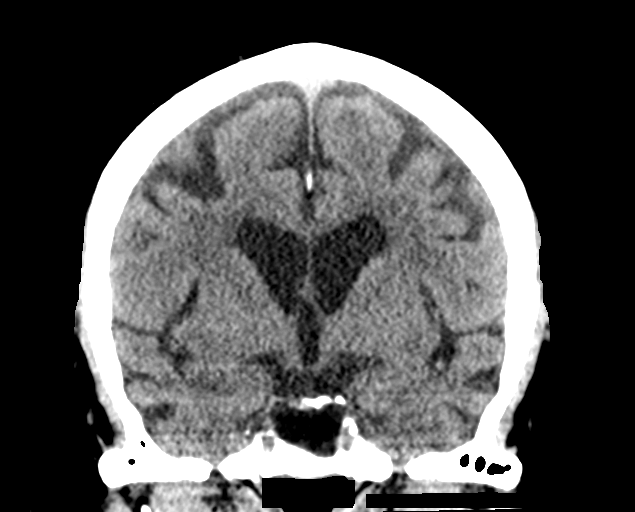

[Series 5: sagittal soft · sagittal · 0.29mm/px · 3 of 52 slices shown]
[im 18/52  brain]
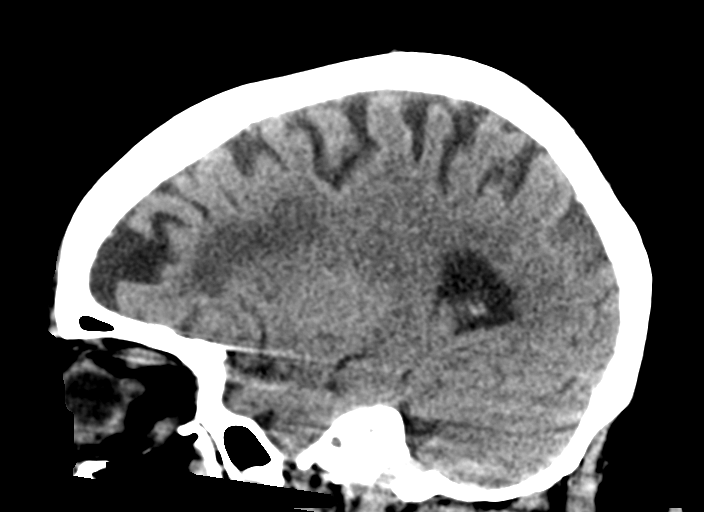
[im 26/52  brain]
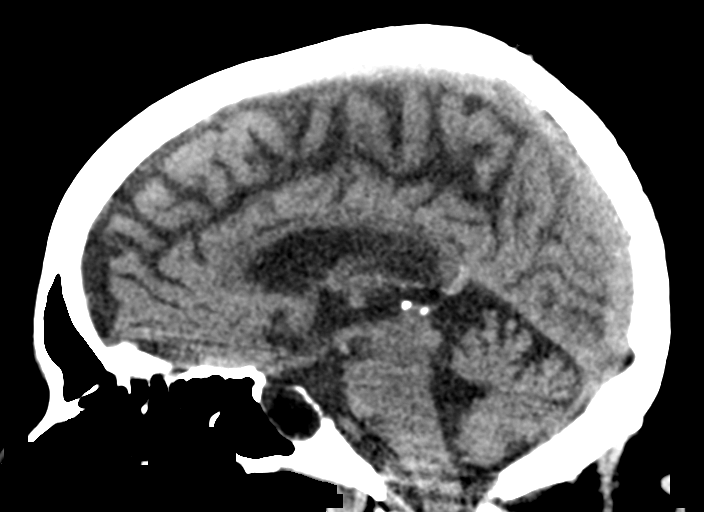
[im 35/52  brain]
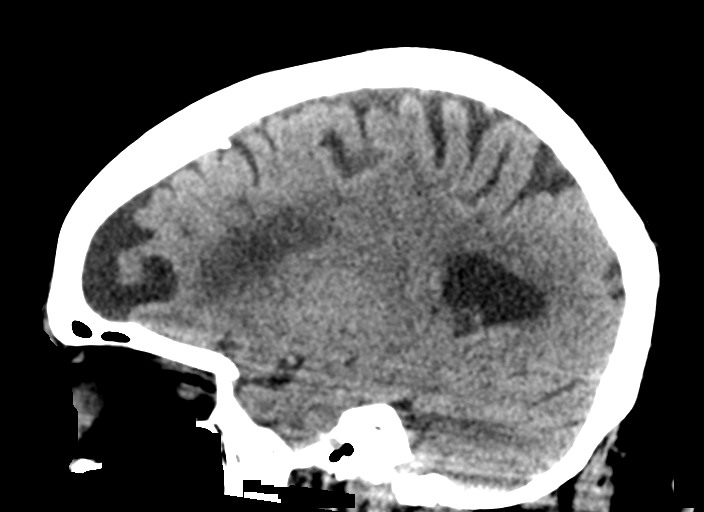

[15 of 47 positions shown; findings below may reference images not displayed]

FINDINGS: Brain: No intracranial hemorrhage. No subdural or extra-axial
collection. There is generalized atrophy. Ventricular dilatation is
likely secondary to central atrophy. Moderate periventricular and
deep white matter hypodensity nonspecific but typically chronic
small vessel ischemia. No evidence of acute infarct. No midline
shift or mass effect. Enlarged partially empty sella.

Vascular: Atherosclerosis of skullbase vasculature without
hyperdense vessel or abnormal calcification.

Skull: No fracture or focal lesion.

Sinuses/Orbits: Displaced fracture of the right inferior orbit with
herniation of fat. Right maxillary hemosinus. No mastoid effusion.

Other: None.
IMPRESSION: 1. No acute intracranial abnormality. No skull fracture.
2. Generalized atrophy and chronic small vessel ischemia.
3. Displaced fracture of the right inferior orbit with herniation of
fat. Right maxillary hemosinus. Recommend face CT as orbital
fracture is not entirely included in the field of view.

## 2021-07-04 MED ORDER — OXYMETAZOLINE HCL 0.05 % NA SOLN
1.0000 | Freq: Once | NASAL | Status: AC
  Administered 2021-07-04: 1 via NASAL
  Filled 2021-07-04: qty 30

## 2021-07-04 NOTE — ED Triage Notes (Signed)
Pt states she fell this morning while getting out of bed. Pt has 2 abrasions to face, had went to UC but was sent to ED for head CT. Pt has blood come out of nose every time she leans head down or tries to stand up. Pt denies LOC.

## 2021-07-04 NOTE — ED Provider Notes (Signed)
[facility] EMERGENCY DEPARTMENT Provider Note  CSN: [id number] Arrival date & time: 07/04/21 1819    History Chief Complaint  Patient presents with   Shelley Mitchell is a 80 y.o. female reports she lost her balance and fell this morning while trying to get her walker unstuck when getting out of bed. She fell forward, hitting her head on the ground, sustaining abrasions to her R cheek and forehead. She did not have LOC. She has had a persistent nosebleed since then that prompted her to go to the local UC where she was directed to come to the ED for evaluation. She has not had any confusion or vomiting.    Past Medical History:  Diagnosis Date   Endometrial polyp    Dr. Elonda Husky   History of endometrial hyperplasia    HTN (hypertension)    Hx of adenomatous colonic polyps 1999   Hyperlipidemia    Mental disorder    GAD   Obesity    Osteoarthritis    Tachycardia    subsequently with bradycardia (?secondary to meds)    Past Surgical History:  Procedure Laterality Date   BACK SURGERY     bilateral knee replacements  2002/2003   CHOLECYSTECTOMY     COLONOSCOPY  03/2002   Dr. Jamesetta Geralds internal hemorrhoids   COLONOSCOPY  06/21/2011   polyp at IC valve (tubular adenoma)   COLONOSCOPY N/A 07/15/2016   one 8 mm polyp at hepatic flexure (tubular adenoma), on 12 mm polyp at hepatic flexure (tubular adenoma), 3 year surveillance if health permits   COLONOSCOPY N/A 10/10/2019   descending colon diverticulosis, two 4-5 mm polyps at splenic flexure (tubular adenomas). No surveillance due to age.    POLYPECTOMY  07/15/2016   Procedure: POLYPECTOMY;  Surgeon: Daneil Dolin, MD;  Location: [facility] ENDO SU[facility];  Service: Endoscopy;;  Splenic Flexure polyps x 2 removed via hot snare   POLYPECTOMY  10/10/2019   Procedure: POLYPECTOMY;  Surgeon: Daneil Dolin, MD;  Location: [facility] END[facility];  Service: Endoscopy;;   TUBAL LIGATION      Family History  Problem Relation Age of Onset    Diabetes Brother    Colon polyps Brother    Diabetes Sister    Cancer Father        Likely prostate   Stroke Mother 58   Colon cancer Neg Hx     Social History   Tobacco Use   Smoking status: Never   Smokeless tobacco: Never  Vaping Use   Vaping Use: Never used  Substance Use Topics   Alcohol use: No    Alcohol/week: 0.0 standard drinks   Drug use: No     Home Medications Prior to Admission medications   Medication Sig Start Date End Date Taking? Authorizing Provider  acetaminophen (TYLENOL) 500 MG tablet Take 100 mg by mouth 2 (two) times daily.    [provider]  aspirin 81 MG EC tablet Take 81 mg by mouth daily.    [provider]  CELEBREX 200 MG capsule Take 200 mg by mouth daily. 05/19/11   [provider]  hydrALAZINE (APRESOLINE) 50 MG tablet Take 50 mg by mouth 3 (three) times daily. 11/17/20   [provider]  hydrochlorothiazide 50 MG tablet Take 50 mg by mouth daily. 03/11/11   [provider]  latanoprost (XALATAN) 0.005 % ophthalmic solution INSTILL 1 DROP INTO BOTH EYES EVERY NIGHT 01/08/21   [provider]  losartan (COZAAR)  100 MG tablet Take 100 mg by mouth daily.    [provider]  lubiprostone (AMITIZA) 8 MCG capsule Take 1 capsule (8 mcg total) by mouth daily with breakfast. 12/13/19   Annitta Needs, NP  medroxyPROGESTERone (PROVERA) 10 MG tablet Take 1 tablet (10 mg total) by mouth daily. 10 days per month 03/05/21   Florian Buff, MD  methylPREDNISolone (MEDROL DOSEPAK) 4 MG TBPK tablet Use as directed 01/15/20   Felipa Furnace, DPM  Multiple Vitamins-Minerals (MULTIVITAMIN WITH MINERALS) tablet Take 1 tablet by mouth daily.    [provider]  NIFEdipine (PROCARDIA XL/ADALAT-CC) 90 MG 24 hr tablet Take 90 mg by mouth daily. 05/12/11   [provider]  nitroGLYCERIN (NITROSTAT) 0.4 MG SL tablet Place 0.4 mg under the tongue every 5 (five) minutes as needed for chest pain.    [provider]  pravastatin (PRAVACHOL) 40 MG tablet Take 40 mg by mouth daily. 05/12/11   [provider]  sertraline (ZOLOFT) 100 MG tablet Take 100 mg by mouth daily.     [provider]  VOLTAREN 1 % GEL Apply 2 gram to affected area four times a day  for pain in neck/shoulder 01/20/21   [provider]     Allergies    Ace inhibitors and Nsaids   Review of Systems   Review of Systems A comprehensive review of systems was completed and negative except as noted in HPI.    Physical Exam BP (!) 190/77   Pulse 73   Temp 97.8 F (36.6 C) (Oral)   Resp 16   Ht '5\' 5"'$  (1.651 m)   Wt 108 kg   SpO2 99%   BMI 39.61 kg/m   Physical Exam Vitals and nursing note reviewed.  Constitutional:      Appearance: Normal appearance.  HENT:     Head: Normocephalic.     Comments: Abrasion to R cheek and R forehead    Nose: Nose normal.     Comments: Small amount of blood in the R naris, no active bleeding    Mouth/Throat:     Mouth: Mucous membranes are moist.  Eyes:     Extraocular Movements: Extraocular movements intact.     Conjunctiva/sclera: Conjunctivae normal.  Cardiovascular:     Rate and Rhythm: Normal rate.  Pulmonary:     Effort: Pulmonary effort is normal.     Breath sounds: Normal breath sounds.  Abdominal:     General: Abdomen is flat.     Palpations: Abdomen is soft.     Tenderness: There is no abdominal tenderness.  Musculoskeletal:        General: No swelling. Normal range of motion.     Cervical back: Neck supple. No tenderness.  Skin:    General: Skin is warm and dry.  Neurological:     General: No focal deficit present.     Mental Status: She is alert.  Psychiatric:        Mood and Affect: Mood normal.     ED Results / Procedures / Treatments   Labs (all labs ordered are listed, but only abnormal results are displayed) Labs Reviewed - No data to display  EKG None  Radiology CT Head Wo Contrast  Result Date:  07/04/2021 CLINICAL DATA:  Head trauma, fall this morning getting out of bed. Abrasions to face. EXAM: CT HEAD WITHOUT CONTRAST TECHNIQUE: Contiguous axial images were obtained from the base of the skull through the vertex without intravenous  contrast. COMPARISON:  None. FINDINGS: Brain: No intracranial hemorrhage. No subdural or extra-axial collection. There is generalized atrophy. Ventricular dilatation is likely secondary to central atrophy. Moderate periventricular and deep white matter hypodensity nonspecific but typically chronic small vessel ischemia. No evidence of acute infarct. No midline shift or mass effect. Enlarged partially empty sella. Vascular: Atherosclerosis of skullbase vasculature without hyperdense vessel or abnormal calcification. Skull: No fracture or focal lesion. Sinuses/Orbits: Displaced fracture of the right inferior orbit with herniation of fat. Right maxillary hemosinus. No mastoid effusion. Other: None. IMPRESSION: 1. No acute intracranial abnormality. No skull fracture. 2. 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 3. Displaced fracture of the right inferior orbit with herniation of fat. Right maxillary hemosinus. Recommend face CT as orbital fracture is not entirely included in the field of view. Electronically Signed   By: Keith Rake M.D.   On: 07/04/2021 20:16   CT Maxillofacial WO CM  Result Date: 07/04/2021 CLINICAL DATA:  Facial trauma EXAM: CT MAXILLOFACIAL WITHOUT CONTRAST TECHNIQUE: Multidetector CT imaging of the maxillofacial structures was performed. Multiplanar CT image reconstructions were also generated. COMPARISON:  Head CT earlier this day. FINDINGS: Osseous: No fracture of the zygomatic arches, nasal bone, or mandibles. Temporomandibular joints are congruent. Many absent teeth. Nasal septum is midline. Orbits: Displaced right inferior orbital floor fracture. Fracture fragments are displaced by 7 mm. The inferior rectus muscle approaches the  fracture defect and is minimally thickened. There is herniation of intraorbital fat. No globe injury. No fracture of the left orbit or left globe injury. 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 No other sinus fracture. Paranasal sinuses are otherwise clear. Soft tissues: Mild right periorbital soft tissue edema. Limited intracranial: Assessed on concurrent head CT, reported separately IMPRESSION: Displaced right inferior orbital floor fracture with herniation of intraorbital fat. The inferior rectus muscle approaches the fracture defect and is minimally thickened. Electronically Signed   By: Keith Rake M.D.   On: 07/04/2021 21:42    Procedures Procedures  Medications Ordered in the ED Medications  oxymetazoline (AFRIN) 0.05 % nasal spray 1 spray (1 spray Each Nare Given 07/04/21 1935)     MDM Rules/Calculators/A&P MDM  Given patient's age, will send for CT. Afrin for mild residual nose bleeding. No other signs of significant facial trauma.  ED Course  I have reviewed the triage vital signs and the nursing notes.  Pertinent labs & imaging results that were available during my care of the patient were reviewed by me and considered in my medical decision making (see chart for details).  Clinical Course as of 07/04/21 2231  Sat Jul 04, 2021  2043 CT head neg for intracranial injury but suspected facial fracture, will send back for dedicated facial scan.  [CS]  2150 Nose has stopped bleeding. CT Face results reviewed, will re-examine her eye to ensure no signs of entrapment then plan discharge with ENT follow up.  [CS]  2230 No signs of entrapment on re-exam. Plan discharge as above.  [CS]    Clinical Course User Index [CS] Truddie Hidden, MD    Final Clinical Impression(s) / ED Diagnoses Final diagnoses:  Fall, initial encounter  Closed fracture of orbit, initial encounter [facility])    Rx / DC Orders ED Discharge Orders     None        Truddie Hidden, MD 07/04/21 2231

## 2021-07-04 NOTE — ED Notes (Signed)
Patient transported to CT 

## 2021-07-07 ENCOUNTER — Other Ambulatory Visit: Payer: Self-pay

## 2021-07-07 ENCOUNTER — Telehealth: Payer: Self-pay | Admitting: Obstetrics & Gynecology

## 2021-07-07 MED ORDER — MEDROXYPROGESTERONE ACETATE 10 MG PO TABS: 10.0000 mg | ORAL_TABLET | Freq: Every day | ORAL | 3 refills | Status: DC

## 2021-07-07 NOTE — Telephone Encounter (Signed)
Pt needs refill on   medroxyPROGESTERone (PROVERA) 10 MG tablet     Pt states she only has one pill left Please advise & call pt    [place]

## 2021-07-21 ENCOUNTER — Telehealth: Payer: Self-pay | Admitting: Primary Care

## 2021-07-21 DIAGNOSIS — R0683 Snoring: Secondary | ICD-10-CM

## 2021-07-21 NOTE — Telephone Encounter (Signed)
Yes please, needs NPSG

## 2021-07-21 NOTE — Telephone Encounter (Signed)
NPSG order has been placed.  Nothing further is needed.

## 2021-07-21 NOTE — Telephone Encounter (Signed)
Sounds good, thanks.

## 2021-07-21 NOTE — Telephone Encounter (Signed)
Beth would you like [place] to go ahead and place the order for the in lab sleep study. Thanks :)

## 2021-07-21 NOTE — Telephone Encounter (Signed)
Beth patient tried to do the home sleep test on 06/12/21 and the machine only showed that it was working from 8:47pm to 11:08pm and was not enough time.  I had her reschedule to redo the sleep study and when I called the son about picking up the appt. He stated that he felt like his mother should do an in lab sleep study instead

## 2021-08-19 ENCOUNTER — Telehealth: Payer: Self-pay | Admitting: Adult Health

## 2021-08-19 NOTE — Telephone Encounter (Signed)
Called pt for clarification. Two identifiers used. Pt explained that she had two different doses of the same medication (Provera) to be taken differently. Pt has been taking Provera 10 mg daily. Spoke with Derrek Monaco to clarify most current order.  Called pt again to instruct her on taking the Provera 10 mg daily for the first 10 days of every month. She is currently having no bleeding. She was instructed to call to be seen if she began bleeding at any time. Pt confirmed understanding.

## 2021-08-19 NOTE — Telephone Encounter (Signed)
Pateint called stating that she was placed on a Hormone medication that she was taking 10 mg and only take 10 times a month, but she states that they have lowered her dosage to 5 mg and she was under the impression that she is suppose to take it every day. Patient would like this clarified. Please contact pt

## 2021-12-30 ENCOUNTER — Other Ambulatory Visit: Payer: Self-pay | Admitting: Obstetrics & Gynecology

## 2022-01-12 ENCOUNTER — Telehealth: Payer: Self-pay | Admitting: Obstetrics & Gynecology

## 2022-01-12 ENCOUNTER — Other Ambulatory Visit: Payer: Self-pay

## 2022-01-12 NOTE — Telephone Encounter (Signed)
Patient called about a refill request her pharmacy had sent for the progesterone. Patient needs asap. Please advise.

## 2022-01-13 NOTE — Telephone Encounter (Signed)
Called pt to inform her of prescription refill request approval. Pt confirmed understanding.

## 2022-02-03 ENCOUNTER — Telehealth: Payer: Self-pay | Admitting: Cardiology

## 2022-02-03 NOTE — Telephone Encounter (Signed)
Patient son lance calling to discuss last echo from 2022 and possible mis information and gap in needed care.   ? ?Patient was notified by pcp Dr. Maia Petties that echo was abnormal and son states the only heart condition ever known and discussed was a murmur.  ? ?Patient son would like a call back to discuss last echo and if mother has been receiving appropriate care.  ?

## 2022-02-04 ENCOUNTER — Ambulatory Visit: Payer: Medicare PPO | Attending: Pulmonary Disease

## 2022-02-04 DIAGNOSIS — G4733 Obstructive sleep apnea (adult) (pediatric): Secondary | ICD-10-CM | POA: Insufficient documentation

## 2022-02-05 ENCOUNTER — Other Ambulatory Visit: Payer: Self-pay

## 2022-02-09 ENCOUNTER — Telehealth (INDEPENDENT_AMBULATORY_CARE_PROVIDER_SITE_OTHER): Payer: Medicare PPO | Admitting: Pulmonary Disease

## 2022-02-09 DIAGNOSIS — G4733 Obstructive sleep apnea (adult) (pediatric): Secondary | ICD-10-CM

## 2022-02-09 NOTE — Telephone Encounter (Signed)
NPSG  showed severe  OSA with AHI 32/ hr , RDI 68/h ?Suggest autoCPAP  5-15 cm, mask of choice  vs formal CPAP titration ? ? ?

## 2022-02-10 NOTE — Telephone Encounter (Signed)
Lm for patient.  

## 2022-02-10 NOTE — Telephone Encounter (Signed)
Patient needs visit in the next 1-2 weeks to discuss sleep study results

## 2022-02-11 DIAGNOSIS — G4733 Obstructive sleep apnea (adult) (pediatric): Secondary | ICD-10-CM

## 2022-02-11 NOTE — Telephone Encounter (Signed)
Lm x2 for patient.  Letter mailed to address on file.  Will close encounter per office protocol.  

## 2022-02-19 ENCOUNTER — Ambulatory Visit: Payer: Medicare PPO | Admitting: Primary Care

## 2022-02-26 ENCOUNTER — Ambulatory Visit: Payer: Medicare PPO | Admitting: Primary Care

## 2022-03-01 ENCOUNTER — Ambulatory Visit: Payer: Medicare PPO | Admitting: Primary Care

## 2022-03-03 ENCOUNTER — Ambulatory Visit: Payer: Medicare PPO | Admitting: Primary Care

## 2022-03-03 ENCOUNTER — Other Ambulatory Visit: Payer: Self-pay | Admitting: Primary Care

## 2022-03-03 NOTE — Telephone Encounter (Signed)
Appt scheduled 03/08/2022. ?Patient is aware and voiced her understanding.  ?Nothing further needed.  ? ?

## 2022-03-03 NOTE — Telephone Encounter (Signed)
Sleep study 02/04/22 showed Severe OSA, AHI 36.6/hr with SpO2 low 91% ? ?Needs OV to discuss results and treatment options   ?

## 2022-03-08 ENCOUNTER — Ambulatory Visit (INDEPENDENT_AMBULATORY_CARE_PROVIDER_SITE_OTHER): Payer: Medicare PPO | Admitting: Internal Medicine

## 2022-03-08 ENCOUNTER — Other Ambulatory Visit: Payer: Self-pay | Admitting: Family Medicine

## 2022-03-08 ENCOUNTER — Ambulatory Visit: Payer: Medicare PPO | Admitting: Primary Care

## 2022-03-08 ENCOUNTER — Encounter: Payer: Self-pay | Admitting: Internal Medicine

## 2022-03-08 ENCOUNTER — Ambulatory Visit (INDEPENDENT_AMBULATORY_CARE_PROVIDER_SITE_OTHER): Payer: Medicare PPO

## 2022-03-08 ENCOUNTER — Ambulatory Visit: Payer: Medicare PPO | Admitting: Internal Medicine

## 2022-03-08 VITALS — BP 120/80 | HR 63 | Temp 98.1°F | Ht 62.0 in | Wt 241.0 lb

## 2022-03-08 DIAGNOSIS — I25118 Atherosclerotic heart disease of native coronary artery with other forms of angina pectoris: Secondary | ICD-10-CM | POA: Diagnosis not present

## 2022-03-08 DIAGNOSIS — G4733 Obstructive sleep apnea (adult) (pediatric): Secondary | ICD-10-CM | POA: Diagnosis not present

## 2022-03-08 DIAGNOSIS — M1909 Primary osteoarthritis, other specified site: Secondary | ICD-10-CM

## 2022-03-08 LAB — ECHOCARDIOGRAM COMPLETE
AR max vel: 2.46 cm2
AV Area VTI: 2.44 cm2
AV Area mean vel: 2.23 cm2
AV Mean grad: 5 mmHg
AV Peak grad: 8.8 mmHg
Ao pk vel: 1.48 m/s
Area-P 1/2: 3.4 cm2
Calc EF: 65.8 %
S' Lateral: 2.5 cm
Single Plane A2C EF: 72.7 %
Single Plane A4C EF: 57.1 %

## 2022-03-08 NOTE — Patient Instructions (Signed)
Order- new DME, new  CPAP auto 5-15, mask of choice, humidifier, supplies, AIrView/ card ? ?Please call if we can help ?

## 2022-03-08 NOTE — Progress Notes (Signed)
03/09/22- 58 yoF never smoker for sleep  ?Seen by NP Volanda Napoleon 04/01/21 initially to r/o OSA ?NPSG 02/04/22 Sleep Med (Dr Elsworth Soho)  AHI 36.6/ hr, desaturation to 91%, body weight 241 lbs ?Medical problem list includes HTN, Osteoarthritis, Hyperlipidemia,  ?Epworth score- ?Body weight today- ?Covid vax-4 Moderna ?Flu vax-no ?Discussed treatment options and CPAP. ?In wheelchair with back pain after surgery.    Son here. ? ?ROS-see HPI   Negative unless "+" ?Constitutional:    weight loss, night sweats, fevers, chills, fatigue, lassitude. ?HEENT:    headaches, difficulty swallowing, tooth/dental problems, sore throat,  ?     sneezing, itching, ear ache, nasal congestion, post nasal drip, snoring ?CV:    chest pain, orthopnea, PND, swelling in lower extremities, anasarca,                                  dizziness, palpitations ?Resp:   shortness of breath with exertion or at rest.   ?             productive cough,   non-productive cough, coughing up of blood.   ?           change in color of mucus.  wheezing.   ?Skin:    rash or lesions. ?GI:  No-   heartburn, indigestion, abdominal pain, nausea, vomiting, diarrhea,  ?               change in bowel habits, loss of appetite ?GU: dysuria, change in color of urine, no urgency or frequency.   flank pain. ?MS:   joint pain, stiffness, decreased range of motion, +back pain. ?Neuro-     nothing unusual ?Psych:  change in mood or affect.  depression or anxiety.   memory loss. ? ?OBJ- Physical Exam ?General- Alert, Oriented, Affect-appropriate, Distress- none acute, + obese ?Skin- rash-none, lesions- none, excoriation- none ?Lymphadenopathy- none ?Head- atraumatic ?           Eyes- Gross vision intact, PERRLA, conjunctivae and secretions clear ?           Ears- Hearing, canals-normal ?           Nose- Clear, no-Septal dev, mucus, polyps, erosion, perforation  ?           Throat- Mallampati III , mucosa clear , drainage- none, tonsils- atrophic ?Neck- flexible , trachea midline, no stridor  , thyroid nl, carotid no bruit ?Chest - symmetrical excursion , unlabored ?          Heart/CV- RRR , no murmur , no gallop  , no rub, nl s1 s2 ?                          - JVD- none , edema- none, stasis changes- none, varices- none ?          Lung- clear to P&A, wheeze- none, cough- none , dullness-none, rub- none ?          Chest wall-  ?Abd-  ?Br/ Gen/ Rectal- Not done, not indicated ?Extrem- cyanosis- none, clubbing, none, atrophy- none, strength- nl ?Neuro- grossly intact to observation ? ?

## 2022-03-17 ENCOUNTER — Other Ambulatory Visit: Payer: Medicare PPO

## 2022-04-04 ENCOUNTER — Encounter: Payer: Self-pay | Admitting: Internal Medicine

## 2022-04-04 DIAGNOSIS — M1990 Unspecified osteoarthritis, unspecified site: Secondary | ICD-10-CM | POA: Insufficient documentation

## 2022-04-04 NOTE — Assessment & Plan Note (Signed)
Appropriate discussion, consideration of options ?Plan- start CPAP auto 5-15 ? ?

## 2022-04-04 NOTE — Assessment & Plan Note (Signed)
Limited mobility. She cites this as barrier to weight loss.  ?

## 2022-04-23 ENCOUNTER — Emergency Department (HOSPITAL_COMMUNITY): Payer: Medicare PPO

## 2022-04-23 ENCOUNTER — Encounter (HOSPITAL_COMMUNITY): Payer: Self-pay | Admitting: Emergency Medicine

## 2022-04-23 ENCOUNTER — Other Ambulatory Visit: Payer: Self-pay

## 2022-04-23 ENCOUNTER — Inpatient Hospital Stay (HOSPITAL_COMMUNITY)
Admission: EM | Admit: 2022-04-23 | Discharge: 2022-04-30 | DRG: 519 | Disposition: A | Discharge status: Skilled Nursing Facility | Payer: Medicare PPO | Attending: Internal Medicine | Admitting: Internal Medicine

## 2022-04-23 DIAGNOSIS — G8929 Other chronic pain: Secondary | ICD-10-CM | POA: Diagnosis present

## 2022-04-23 DIAGNOSIS — Z6841 Body Mass Index (BMI) 40.0 and over, adult: Secondary | ICD-10-CM | POA: Diagnosis not present

## 2022-04-23 DIAGNOSIS — R29898 Other symptoms and signs involving the musculoskeletal system: Secondary | ICD-10-CM | POA: Diagnosis not present

## 2022-04-23 DIAGNOSIS — M545 Low back pain, unspecified: Secondary | ICD-10-CM | POA: Diagnosis present

## 2022-04-23 DIAGNOSIS — I10 Essential (primary) hypertension: Secondary | ICD-10-CM | POA: Diagnosis present

## 2022-04-23 DIAGNOSIS — R609 Edema, unspecified: Secondary | ICD-10-CM | POA: Diagnosis present

## 2022-04-23 DIAGNOSIS — Z823 Family history of stroke: Secondary | ICD-10-CM

## 2022-04-23 DIAGNOSIS — M4805 Spinal stenosis, thoracolumbar region: Secondary | ICD-10-CM | POA: Diagnosis not present

## 2022-04-23 DIAGNOSIS — M4800 Spinal stenosis, site unspecified: Secondary | ICD-10-CM | POA: Diagnosis present

## 2022-04-23 DIAGNOSIS — Z79899 Other long term (current) drug therapy: Secondary | ICD-10-CM | POA: Diagnosis not present

## 2022-04-23 DIAGNOSIS — Z8371 Family history of colonic polyps: Secondary | ICD-10-CM | POA: Diagnosis not present

## 2022-04-23 DIAGNOSIS — G9589 Other specified diseases of spinal cord: Secondary | ICD-10-CM | POA: Diagnosis present

## 2022-04-23 DIAGNOSIS — Z86010 Personal history of colonic polyps: Secondary | ICD-10-CM | POA: Diagnosis not present

## 2022-04-23 DIAGNOSIS — Z833 Family history of diabetes mellitus: Secondary | ICD-10-CM

## 2022-04-23 DIAGNOSIS — E785 Hyperlipidemia, unspecified: Secondary | ICD-10-CM | POA: Diagnosis present

## 2022-04-23 DIAGNOSIS — Z8719 Personal history of other diseases of the digestive system: Secondary | ICD-10-CM | POA: Diagnosis not present

## 2022-04-23 DIAGNOSIS — E860 Dehydration: Secondary | ICD-10-CM | POA: Diagnosis present

## 2022-04-23 DIAGNOSIS — F419 Anxiety disorder, unspecified: Secondary | ICD-10-CM | POA: Diagnosis present

## 2022-04-23 DIAGNOSIS — Z7982 Long term (current) use of aspirin: Secondary | ICD-10-CM | POA: Diagnosis not present

## 2022-04-23 DIAGNOSIS — E222 Syndrome of inappropriate secretion of antidiuretic hormone: Secondary | ICD-10-CM | POA: Diagnosis present

## 2022-04-23 DIAGNOSIS — Z96653 Presence of artificial knee joint, bilateral: Secondary | ICD-10-CM | POA: Diagnosis present

## 2022-04-23 DIAGNOSIS — M1990 Unspecified osteoarthritis, unspecified site: Secondary | ICD-10-CM | POA: Diagnosis present

## 2022-04-23 DIAGNOSIS — M47817 Spondylosis without myelopathy or radiculopathy, lumbosacral region: Secondary | ICD-10-CM | POA: Diagnosis present

## 2022-04-23 DIAGNOSIS — M4804 Spinal stenosis, thoracic region: Secondary | ICD-10-CM | POA: Diagnosis present

## 2022-04-23 DIAGNOSIS — E783 Hyperchylomicronemia: Secondary | ICD-10-CM | POA: Diagnosis not present

## 2022-04-23 LAB — CBC WITH DIFFERENTIAL/PLATELET
Abs Immature Granulocytes: 0.01 10*3/uL (ref 0.00–0.07)
Basophils Absolute: 0 10*3/uL (ref 0.0–0.1)
Basophils Relative: 1 %
Eosinophils Absolute: 0.1 10*3/uL (ref 0.0–0.5)
Eosinophils Relative: 1 %
HCT: 34 % — ABNORMAL LOW (ref 36.0–46.0)
Hemoglobin: 10.9 g/dL — ABNORMAL LOW (ref 12.0–15.0)
Immature Granulocytes: 0 %
Lymphocytes Relative: 18 %
Lymphs Abs: 1.1 10*3/uL (ref 0.7–4.0)
MCH: 28.2 pg (ref 26.0–34.0)
MCHC: 32.1 g/dL (ref 30.0–36.0)
MCV: 87.9 fL (ref 80.0–100.0)
Monocytes Absolute: 0.7 10*3/uL (ref 0.1–1.0)
Monocytes Relative: 11 %
Neutro Abs: 4.1 10*3/uL (ref 1.7–7.7)
Neutrophils Relative %: 69 %
Platelets: 217 10*3/uL (ref 150–400)
RBC: 3.87 MIL/uL (ref 3.87–5.11)
RDW: 15.2 % (ref 11.5–15.5)
WBC: 6 10*3/uL (ref 4.0–10.5)
nRBC: 0 % (ref 0.0–0.2)

## 2022-04-23 LAB — BASIC METABOLIC PANEL
Anion gap: 7 (ref 5–15)
BUN: 19 mg/dL (ref 8–23)
CO2: 25 mmol/L (ref 22–32)
Calcium: 9.5 mg/dL (ref 8.9–10.3)
Chloride: 106 mmol/L (ref 98–111)
Creatinine, Ser: 0.67 mg/dL (ref 0.44–1.00)
GFR, Estimated: >60 mL/min (ref >60)
Glucose, Bld: 107 mg/dL — ABNORMAL HIGH (ref 70–99)
Potassium: 3.9 mmol/L (ref 3.5–5.1)
Sodium: 138 mmol/L (ref 135–145)

## 2022-04-23 LAB — CK: Total CK: 98 U/L (ref 38–234)

## 2022-04-23 LAB — URINALYSIS, ROUTINE W REFLEX MICROSCOPIC
Bilirubin Urine: NEGATIVE
Glucose, UA: NEGATIVE mg/dL
Hgb urine dipstick: NEGATIVE
Ketones, ur: NEGATIVE mg/dL
Leukocytes,Ua: NEGATIVE
Nitrite: NEGATIVE
Protein, ur: NEGATIVE mg/dL
Specific Gravity, Urine: 1.01 (ref 1.005–1.030)
pH: 7 (ref 5.0–8.0)

## 2022-04-23 LAB — MAGNESIUM: Magnesium: 2.3 mg/dL (ref 1.7–2.4)

## 2022-04-23 IMAGING — CT CT L SPINE W/O CM
3 series · 13 of 33 positions shown, 16 images · non-contrast
Comparison: None Available.

CLINICAL DATA: Fall, lower back pain



[Series 4: l spine soft · axial · 0.36mm/px · z∈[-519,-359]mm · 5 of 116 slices shown, 7 images]
[im 18/116  soft-tissue]
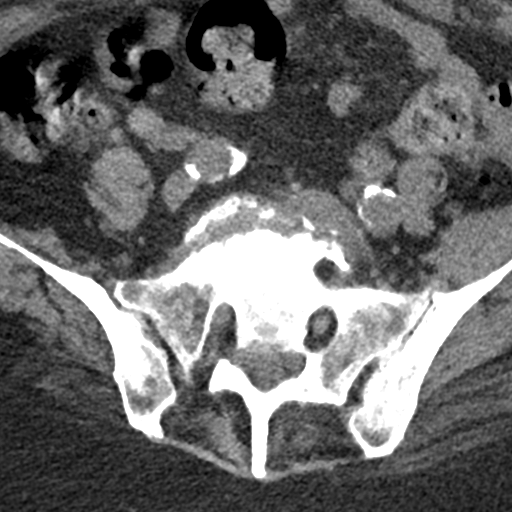
[im 18/116  bone]
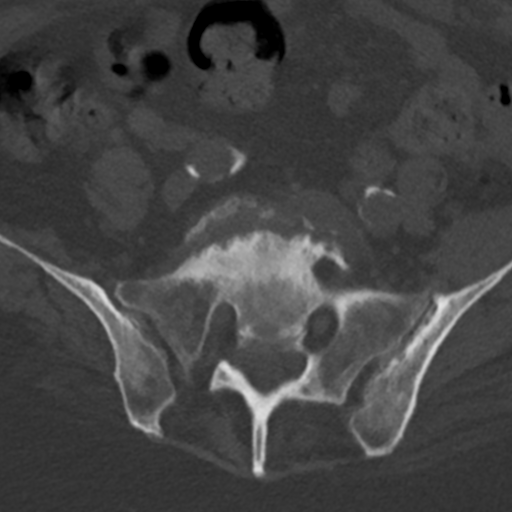
[im 36/116  bone]
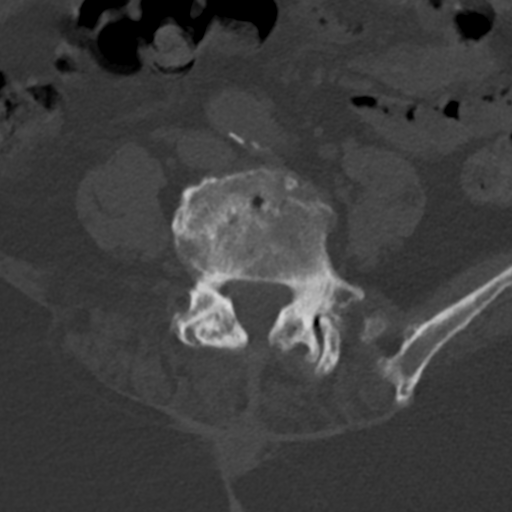
[im 62/116  bone]
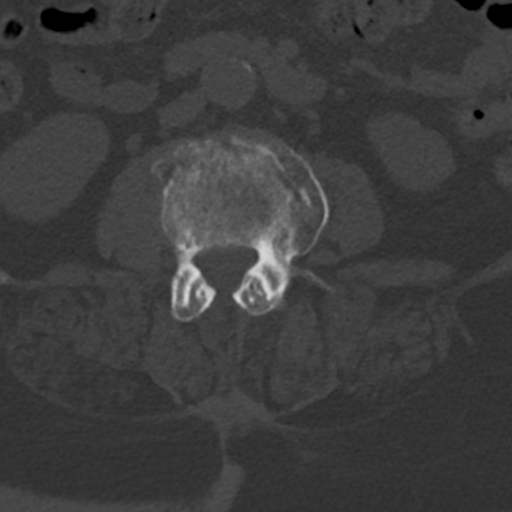
[im 80/116  bone]
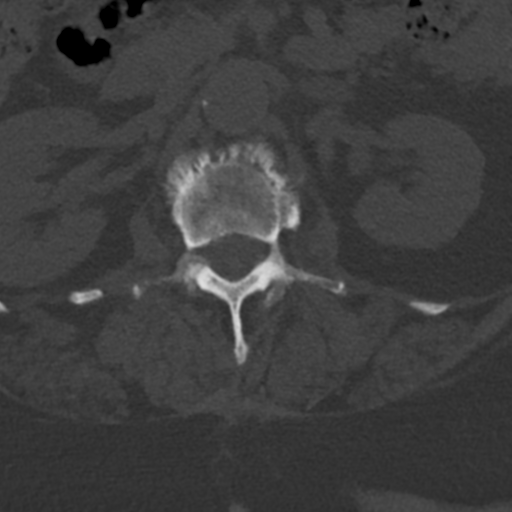
[im 98/116  soft-tissue]
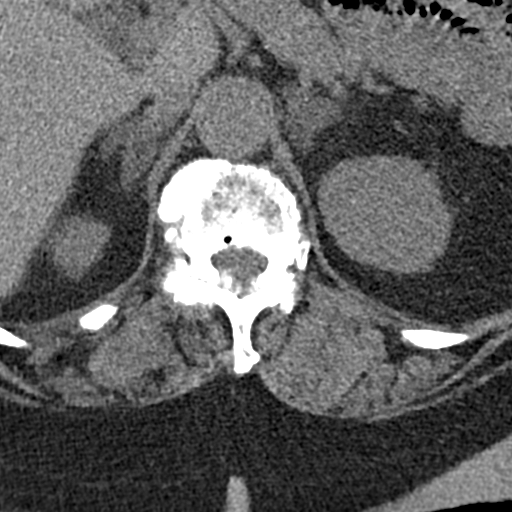
[im 98/116  bone]
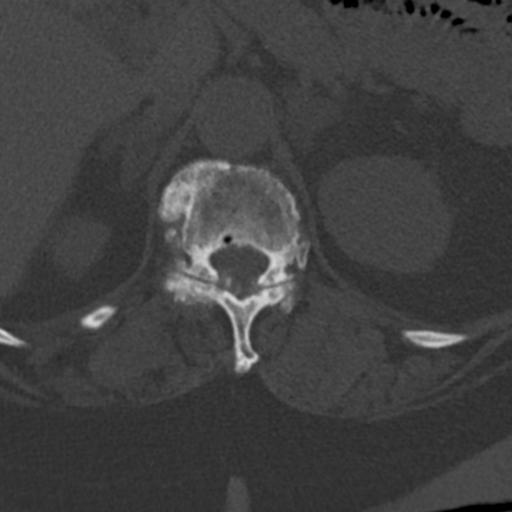

[Series 6: coronal bone · coronal · 0.34mm/px · 3 of 86 slices shown]
[im 18/86  bone]
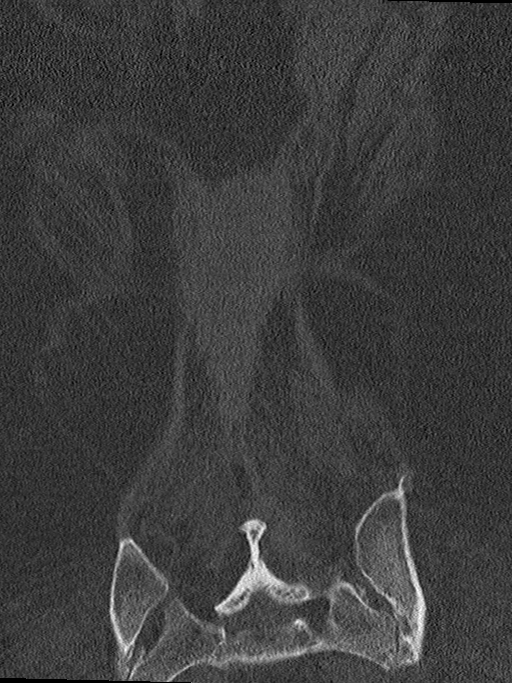
[im 35/86  bone]
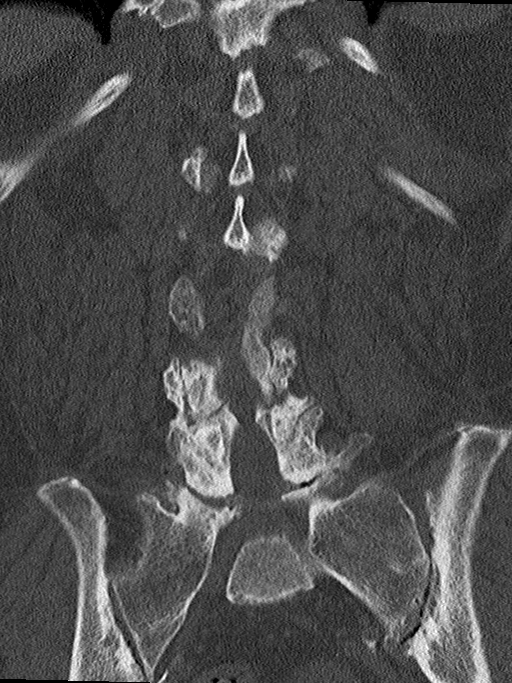
[im 52/86  bone]
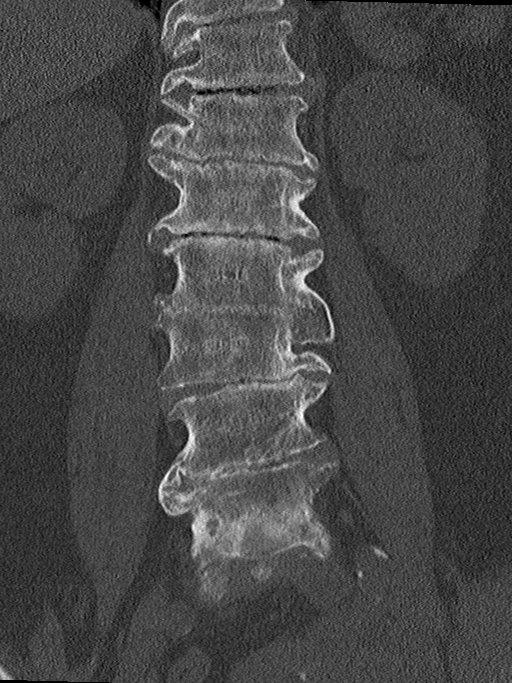

[Series 7: l spine soft sagittal · sagittal · 0.39mm/px · 5 of 79 slices shown, 6 images]
[im 27/79  bone]
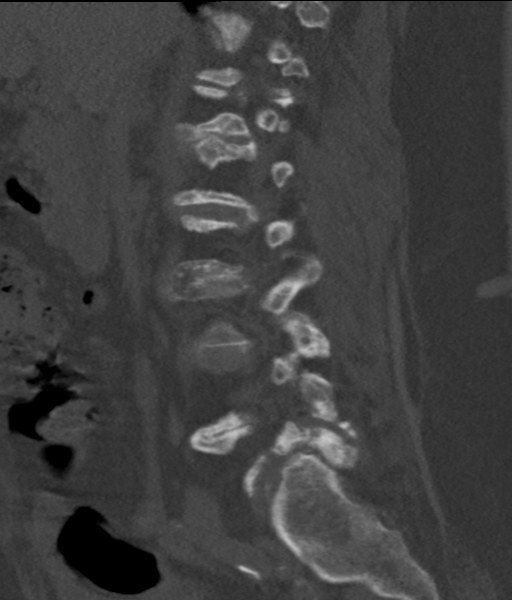
[im 33/79  bone]
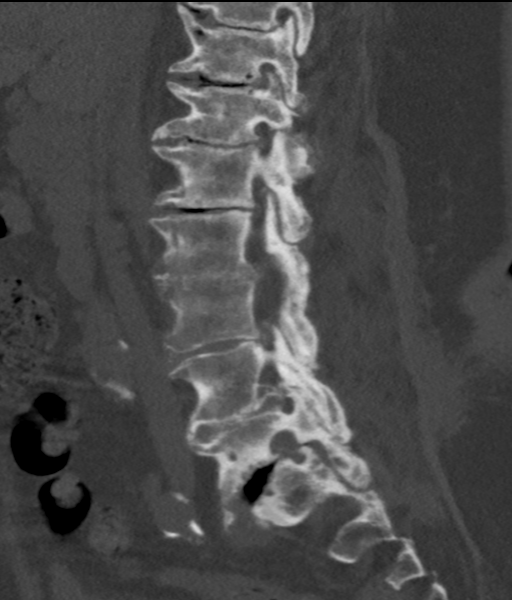
[im 40/79  soft-tissue]
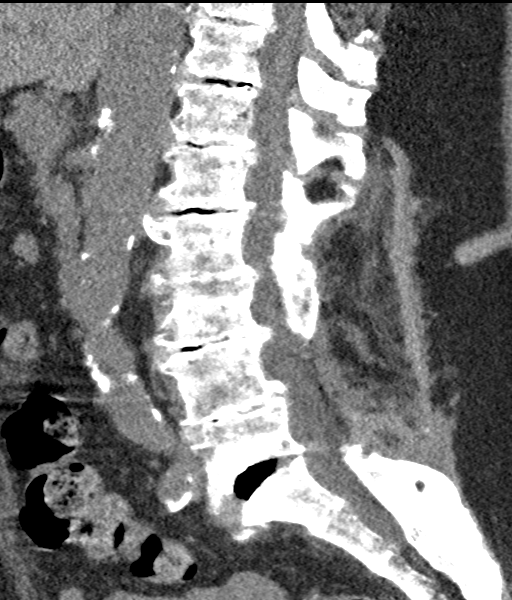
[im 40/79  bone]
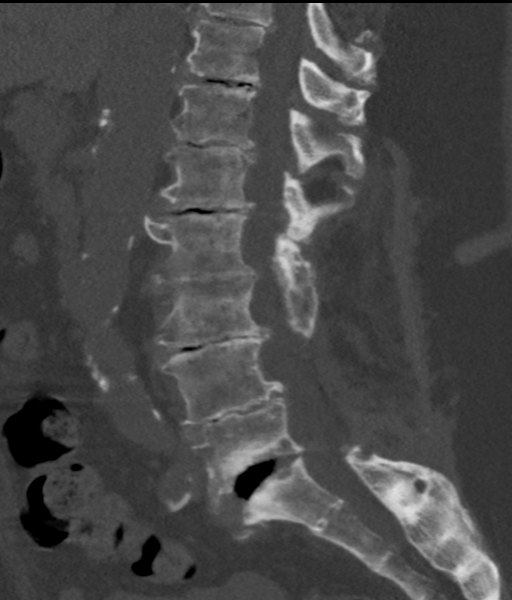
[im 46/79  bone]
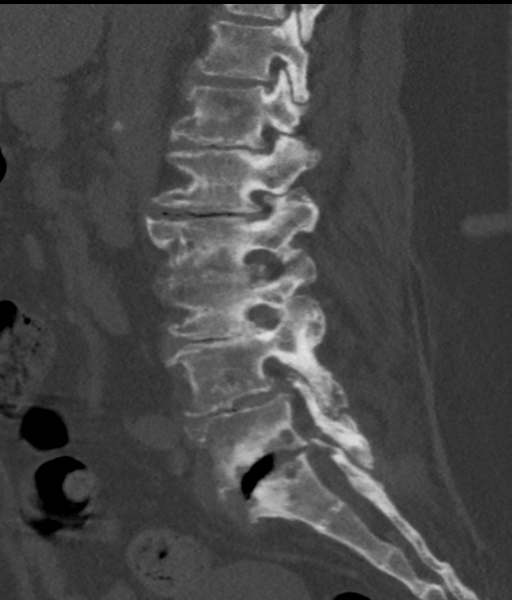
[im 53/79  bone]
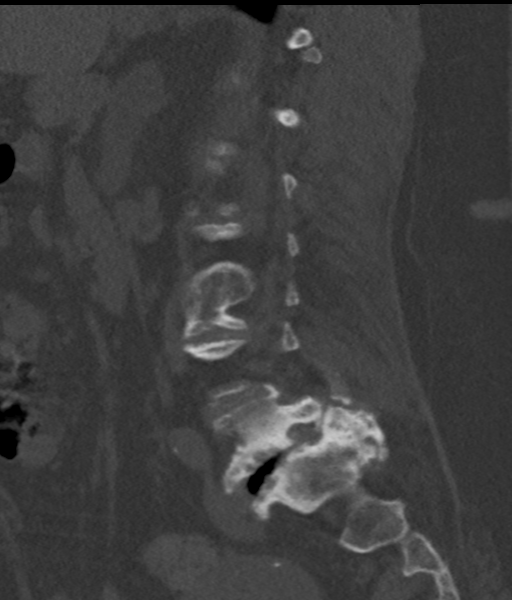

[13 of 33 positions shown; findings below may reference images not displayed]

FINDINGS: Segmentation: Standard; the lowest formed disc space is designated
L5-S1.

Alignment: Normal. There is no antero or retrolisthesis. There is no
jumped or perched facets or other evidence of traumatic
malalignment.

Vertebrae: Vertebral body heights are preserved. There is no acute
fracture. There is no suspicious osseous lesion. The bones are
diffusely demineralized.

Paraspinal and other soft tissues: There is extensive calcified
atherosclerotic plaque throughout the nonaneurysmal abdominal aorta.
There is thickening of the bilateral adrenal glands without discrete
nodule. Are postsurgical changes in the posterior soft tissues from
L2 through L5.

Disc levels: There is advanced multilevel disc space narrowing
throughout the lumbar spine with multilevel vacuum disc phenomenon,
most notable at L5-S1. There is advanced multilevel facet
arthropathy, most severe in the lower lumbar spine. There is
multilevel degenerative endplate change with bulky anterior
osteophytes.

T12-L1: Is a disc bulge, degenerative endplate change, and bilateral
facet arthropathy resulting in mild-to-moderate spinal canal
stenosis and moderate right and mild left neural foraminal stenosis

L1-L2: There is a disc bulge, degenerative endplate change, and
bilateral facet arthropathy resulting in mild-to-moderate spinal
canal stenosis and moderate bilateral neural foraminal stenosis

L2-L3: Status post posterior decompression. There is a disc bulge,
degenerative endplate change, and bilateral facet arthropathy
resulting in mild-to-moderate bilateral neural foraminal stenosis
without significant spinal canal stenosis

L3-L4: Status post posterior decompression. There is a disc bulge,
degenerative endplate change, and bilateral facet arthropathy
resulting in moderate left and mild right neural foraminal stenosis
without significant spinal canal stenosis

L4-L5: Status post posterior decompression. There is a disc bulge,
degenerative endplate change, and severe bilateral facet arthropathy
resulting in moderate to severe right and mild-to-moderate left
neural foraminal stenosis without significant spinal canal stenosis

L5-S1: Status post posterior decompression. There is a disc bulge,
degenerative endplate change, and severe bilateral facet arthropathy
resulting in severe bilateral neural foraminal stenosis without
significant spinal canal stenosis.
IMPRESSION: 1. No acute fracture or traumatic malalignment of the lumbar spine.
2. Postsurgical changes reflecting posterior decompression from
L2-L3 through L5-S1. There is no definite high-grade spinal canal
stenosis, but there is multilevel advanced multilevel degenerative
change with bilateral neural foraminal stenosis, most advanced at
L5-S1.
3.  Aortic Atherosclerosis ([55]-[55]).

## 2022-04-23 IMAGING — MR MR HEAD W/O CM
11 of 12 series · 41 of 48 positions shown · non-contrast
Comparison: None Available.

CLINICAL DATA: Neuro deficit, acute, stroke suspected; left leg
weak

EXAM:
MRI HEAD WITHOUT CONTRAST
TECHNIQUE: Multiplanar, multiecho pulse sequences of the brain and surrounding
structures were obtained without intravenous contrast.

[Series 5: DWI · coronal · 5.0mm · 0.88mm/px · 4 of 28 slices shown (1 of 6)]
[im 1/28]
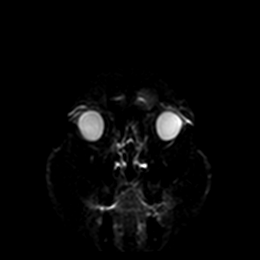
[im 10/28]
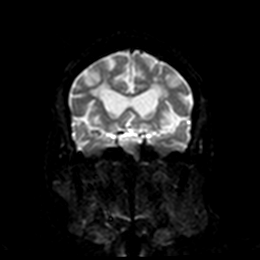
[im 19/28]
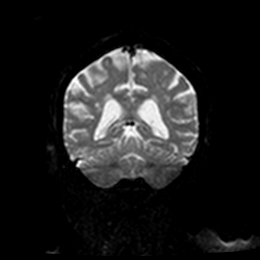
[im 28/28]
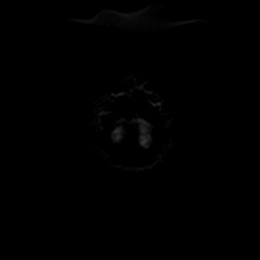

[Series 5: DWI · coronal · 5.0mm · 0.88mm/px · 4 of 28 slices shown (2 of 6)]
[im 1/28]
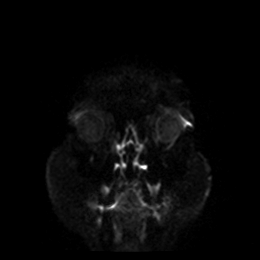
[im 10/28]
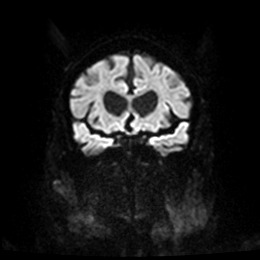
[im 19/28]
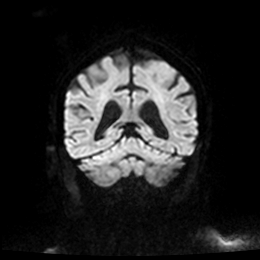
[im 28/28]
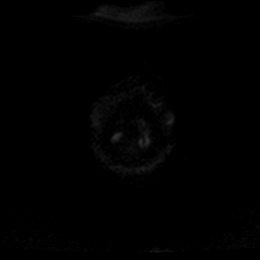

[Series 6: DWI · coronal · 5.0mm · 0.88mm/px · 4 of 28 slices shown (3 of 6)]
[im 1/28]
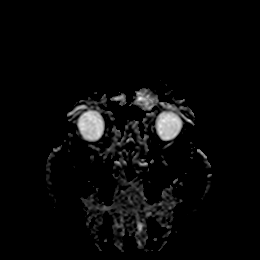
[im 10/28]
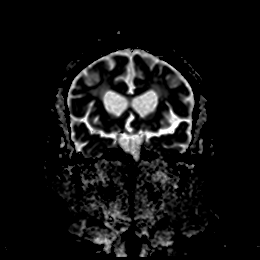
[im 19/28]
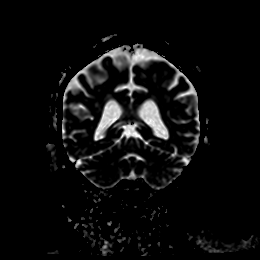
[im 28/28]
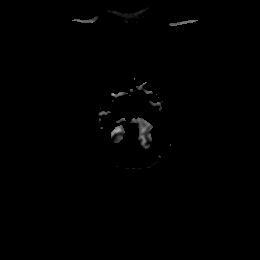

[Series 7: DWI · axial · 4.0mm · 0.88mm/px · z∈[-76,+63]mm · 5 of 36 slices shown (4 of 6)]
[im 1/36]
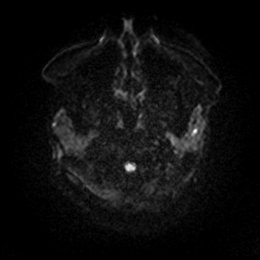
[im 9/36]
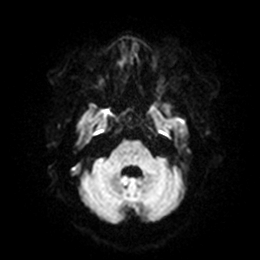
[im 18/36]
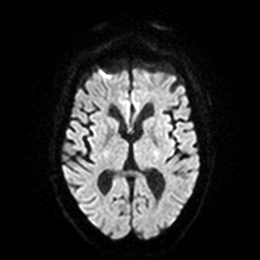
[im 27/36]
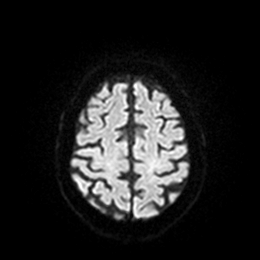
[im 36/36]
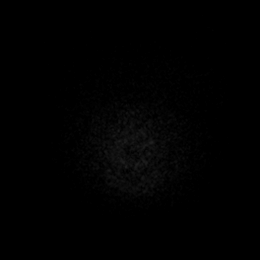

[Series 7: DWI · axial · 4.0mm · 0.88mm/px · z∈[-76,+63]mm · 4 of 36 slices shown (5 of 6)]
[im 1/36]
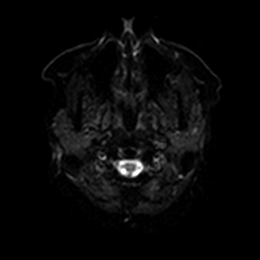
[im 12/36]
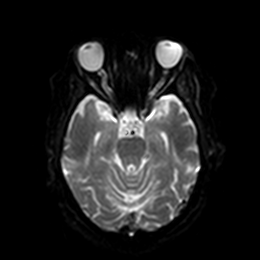
[im 24/36]
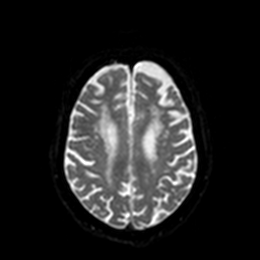
[im 36/36]
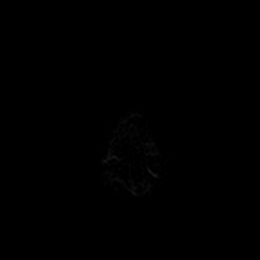

[Series 8: DWI · axial · 4.0mm · 0.88mm/px · z∈[-76,+63]mm · 4 of 36 slices shown (6 of 6)]
[im 1/36]
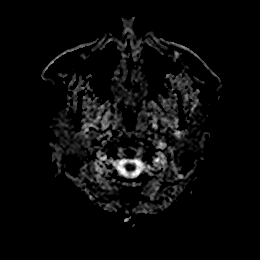
[im 12/36]
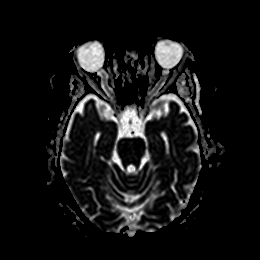
[im 24/36]
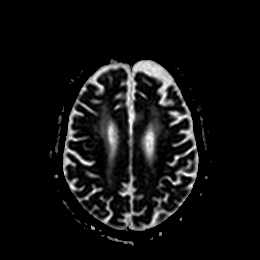
[im 36/36]
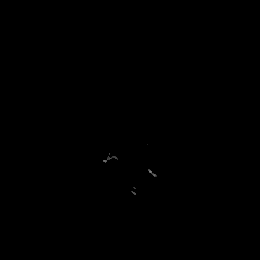

[Series 9: T1 · sagittal · 5.0mm · 0.94mm/px · 3 of 21 slices shown]
[im 1/21]
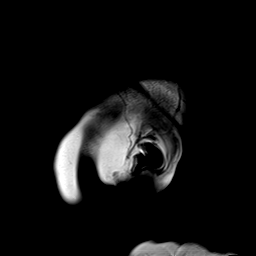
[im 11/21]
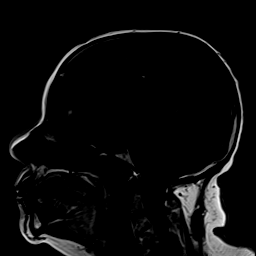
[im 21/21]
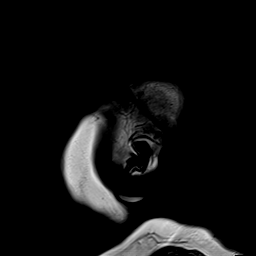

[Series 10: T2 · axial · 5.0mm · 0.72mm/px · z∈[-72,+60]mm · 2 of 20 slices shown (1 of 2)]
[im 1/20]
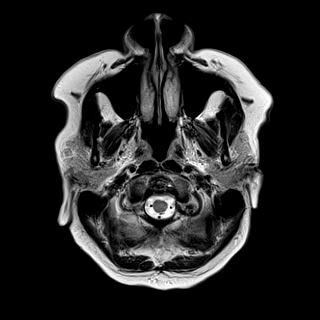
[im 20/20]
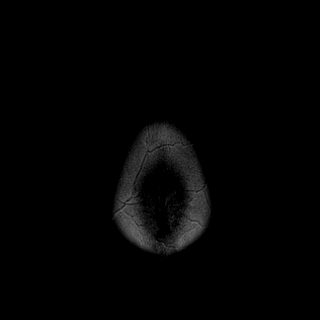

[Series 11: ax hemo · axial · 5.0mm · 0.86mm/px · z∈[-78,+65]mm · 3 of 25 slices shown]
[im 1/25]
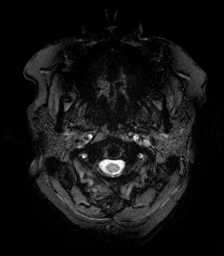
[im 13/25]
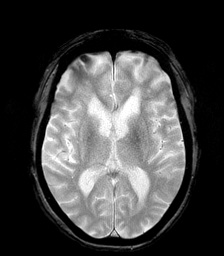
[im 25/25]
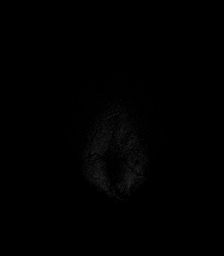

[Series 12: FLAIR · axial · 4.0mm · 0.43mm/px · z∈[-84,+71]mm · 5 of 40 slices shown]
[im 1/40]
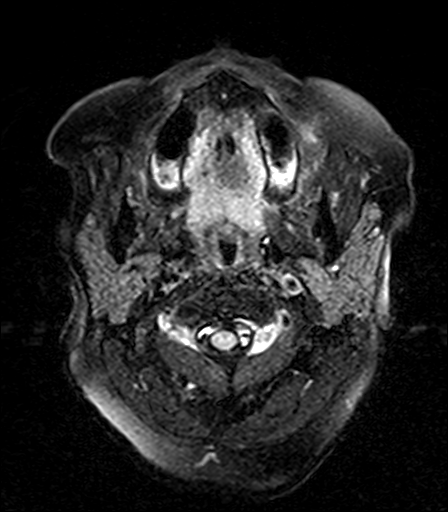
[im 10/40]
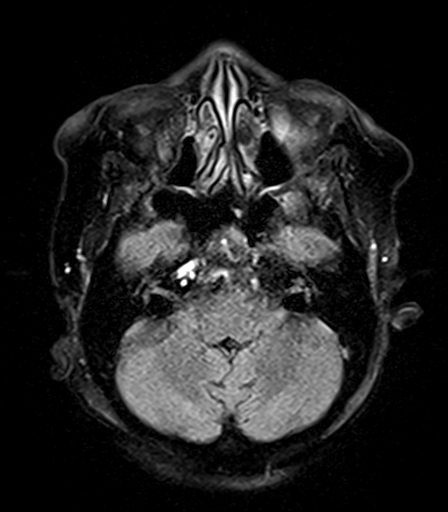
[im 20/40]
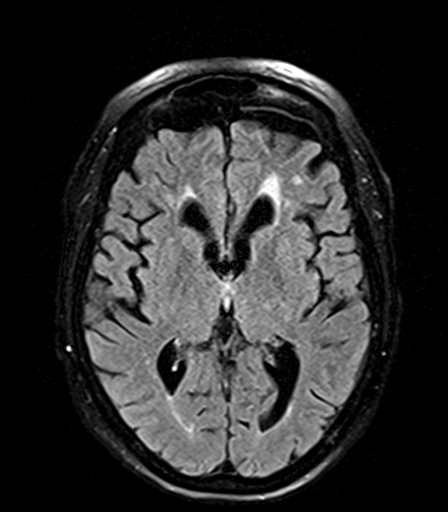
[im 30/40]
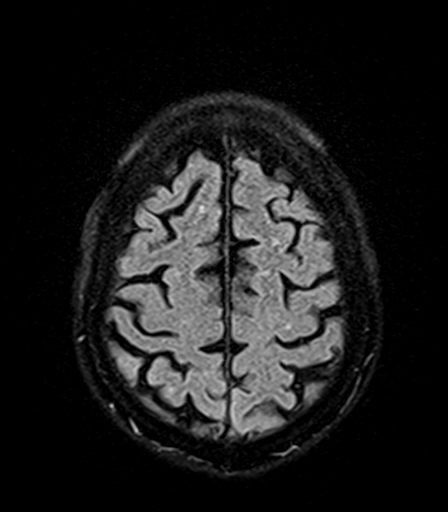
[im 40/40]
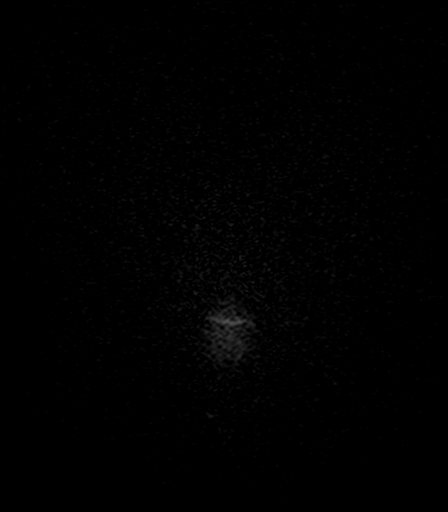

[Series 14: T2 · coronal · 5.0mm · 0.72mm/px · 3 of 28 slices shown (2 of 2)]
[im 1/28]
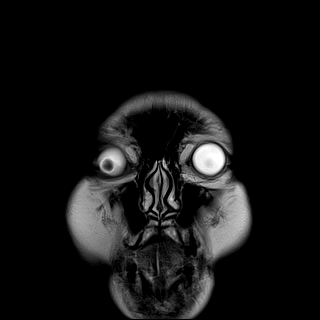
[im 14/28]
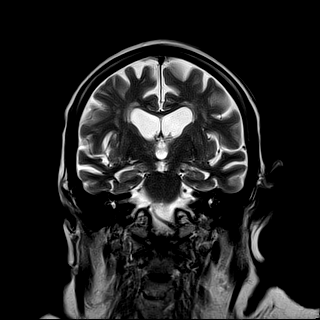
[im 28/28]
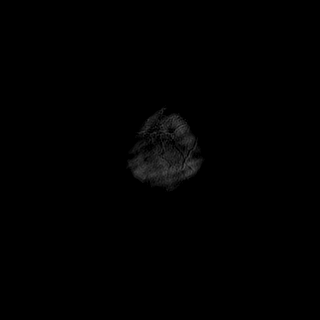

[41 of 48 positions shown; findings below may reference images not displayed]

FINDINGS: Brain: There is no acute infarction or intracranial hemorrhage.
There is no intracranial mass, mass effect, or edema. There is no
hydrocephalus or extra-axial fluid collection. Prominence of the
ventricles and sulci reflects generalized parenchymal volume loss.
Patchy and confluent areas of T2 hyperintensity in the
supratentorial and pontine white matter are nonspecific may reflect
moderate chronic microvascular ischemic changes.

Vascular: Major vessel flow voids at the skull base are preserved.

Skull and upper cervical spine: Normal marrow signal is preserved.

Sinuses/Orbits: Paranasal sinuses are aerated. Orbits are
unremarkable.

Other: Expanded, "empty" sella.  Mastoid air cells are clear.
IMPRESSION: No acute infarction, hemorrhage, or mass.

Moderate chronic microvascular ischemic changes.

## 2022-04-23 IMAGING — MR MR LUMBAR SPINE W/O CM
5 series · 31 of 48 positions shown · non-contrast
Comparison: CT [DATE]

CLINICAL DATA: Low back pain, prior surgery, new symptoms left leg
weakness

EXAM:
MRI LUMBAR SPINE WITHOUT CONTRAST
TECHNIQUE: Multiplanar, multisequence MR imaging of the lumbar spine was
performed. No intravenous contrast was administered.

[Series 5: T2 · sagittal · 4.0mm · 0.81mm/px · 6 of 16 slices shown (1 of 2)]
[im 1/16]
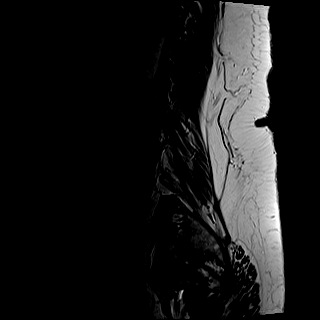
[im 4/16]
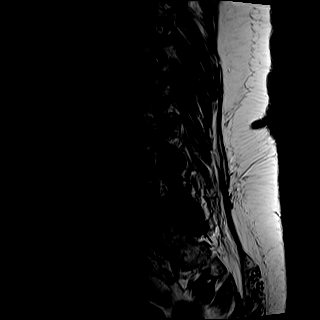
[im 7/16]
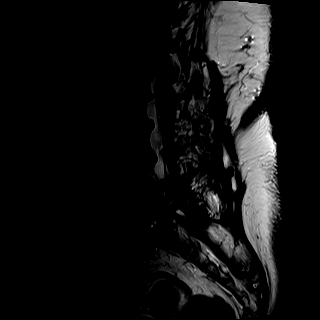
[im 10/16]
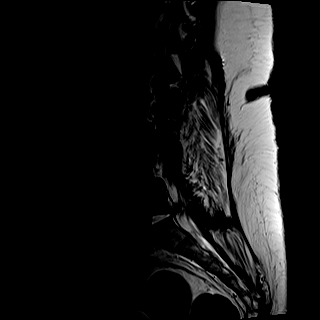
[im 13/16]
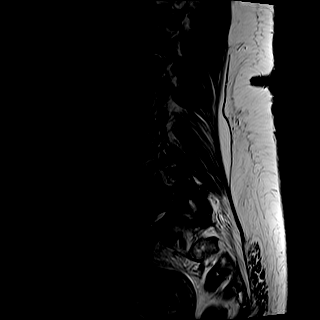
[im 16/16]
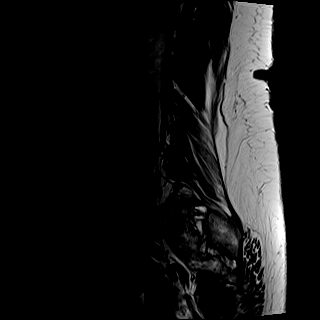

[Series 6: STIR · sagittal · 4.0mm · 0.51mm/px · 2 of 16 slices shown]
[im 1/16]
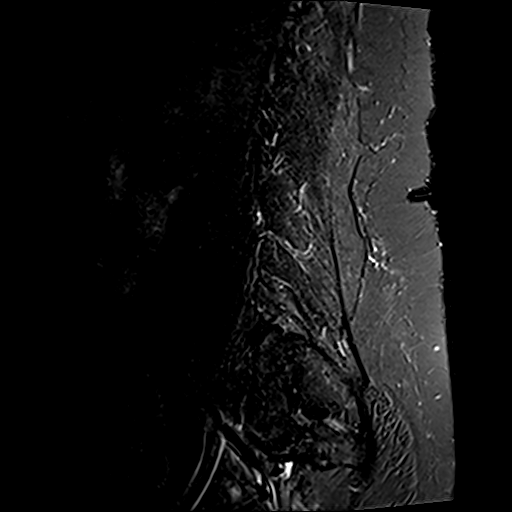
[im 3/16]
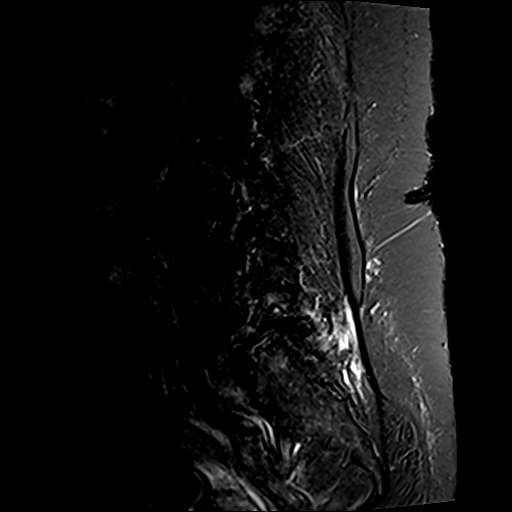

[Series 7: T1 · sagittal · 4.0mm · 1.02mm/px · 7 of 16 slices shown (1 of 2)]
[im 1/16]
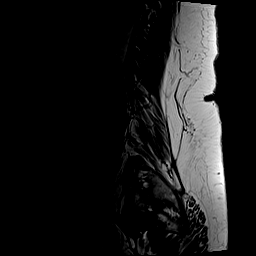
[im 3/16]
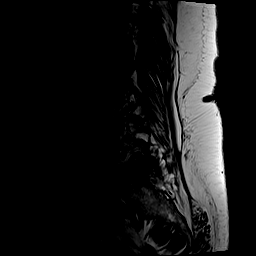
[im 6/16]
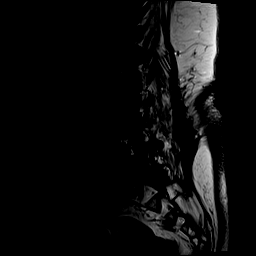
[im 8/16]
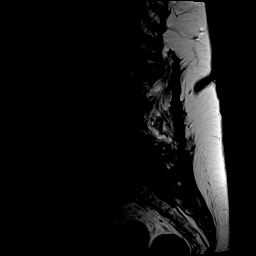
[im 11/16]
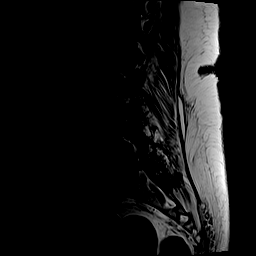
[im 13/16]
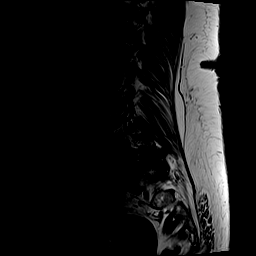
[im 16/16]
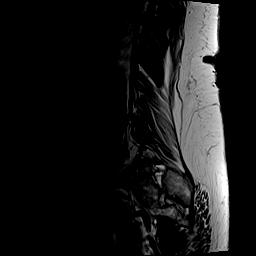

[Series 8: T2 · axial · 4.0mm · 0.70mm/px · z∈[-579,-412]mm · 8 of 31 slices shown (2 of 2)]
[im 1/31]
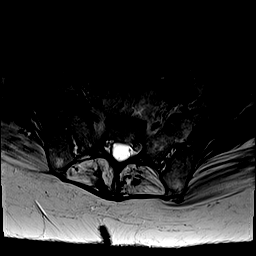
[im 5/31]
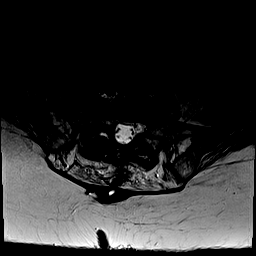
[im 10/31]
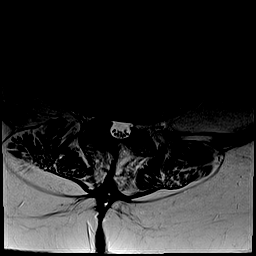
[im 14/31]
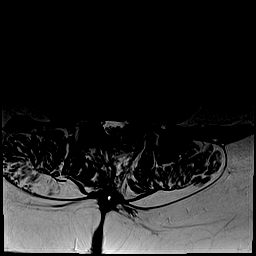
[im 17/31]
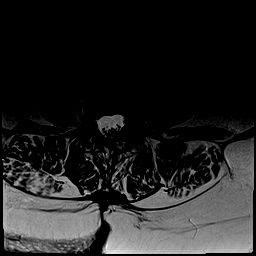
[im 21/31]
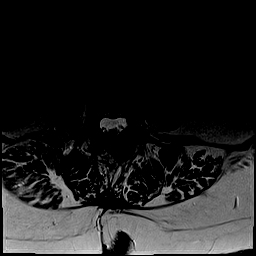
[im 26/31]
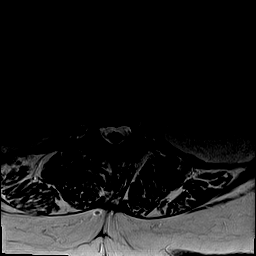
[im 31/31]
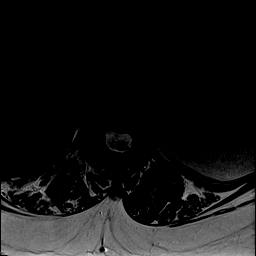

[Series 9: T1 · axial · 4.0mm · 0.35mm/px · z∈[-579,-412]mm · 8 of 31 slices shown (2 of 2)]
[im 1/31]
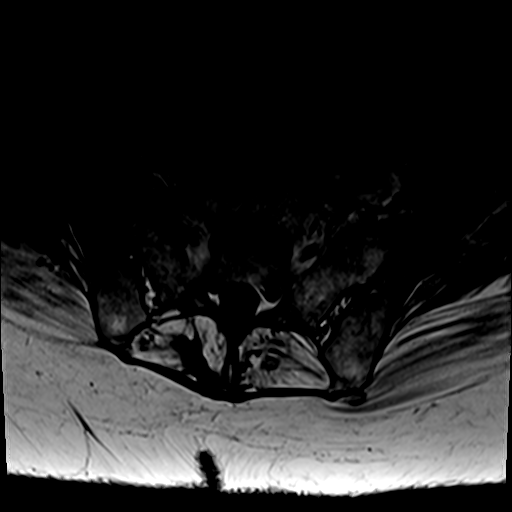
[im 5/31]
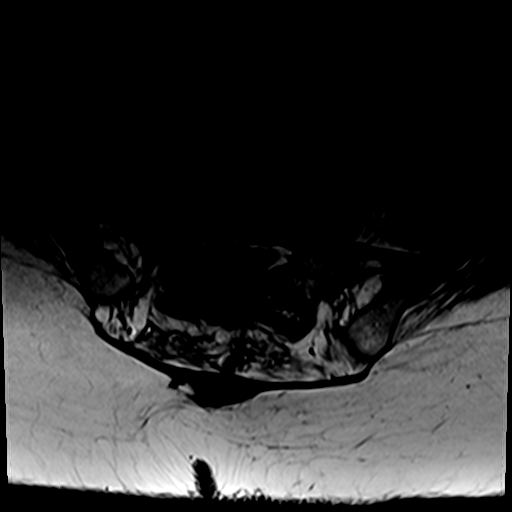
[im 10/31]
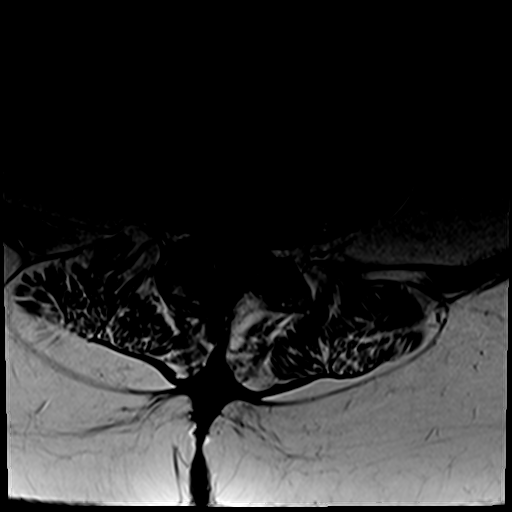
[im 14/31]
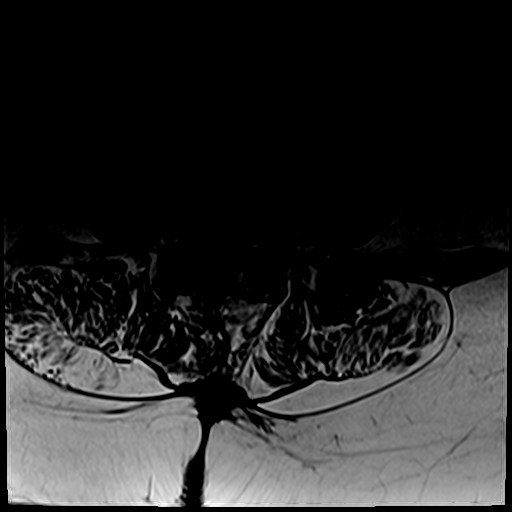
[im 17/31]
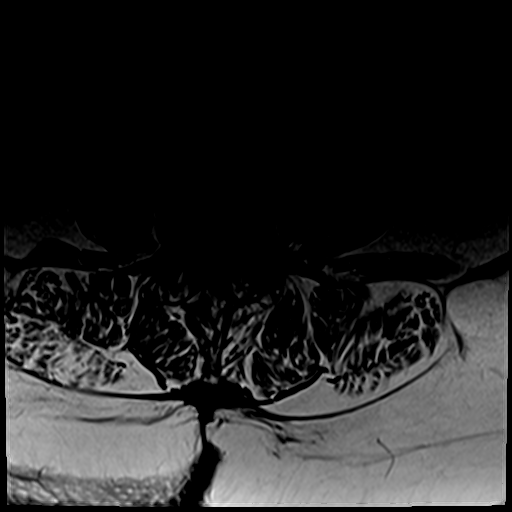
[im 21/31]
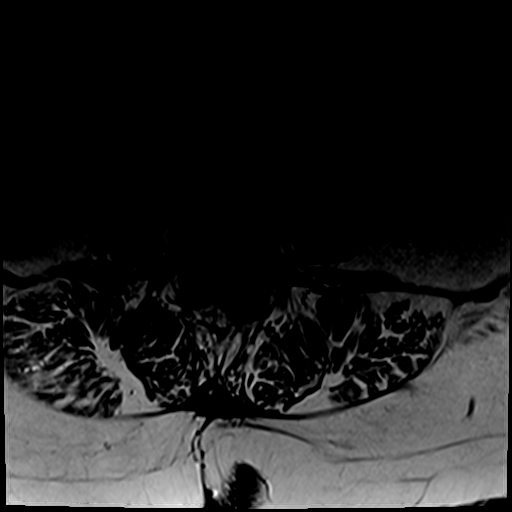
[im 26/31]
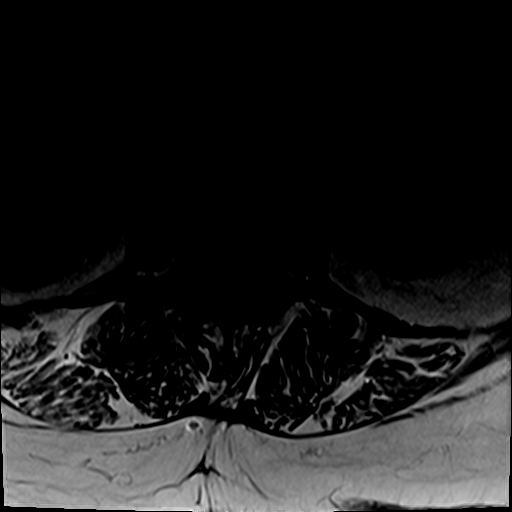
[im 31/31]
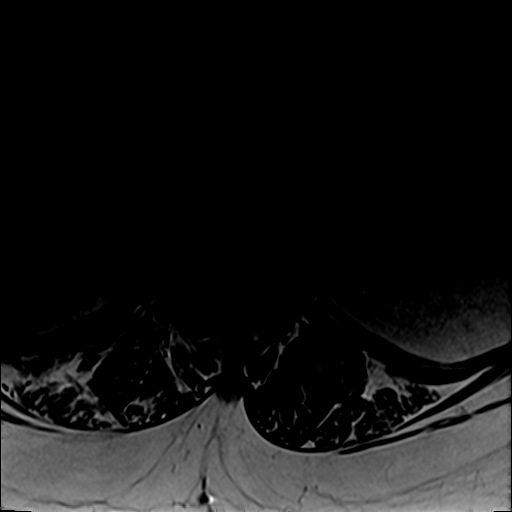

[31 of 48 positions shown; findings below may reference images not displayed]

FINDINGS: Segmentation:  Standard.

Alignment:  Grade 1 anterolisthesis of L4 on L5.

Vertebrae: No acute fracture. No suspicious bone lesion. Bilateral
facet joint effusions at L5-S1. Small amount of fluid within the
L5-S1 disc space without associated endplate marrow edema. Vacuum
disc phenomenon is present at this level. No paravertebral
inflammatory changes or other evidence to suggest discitis.

Conus medullaris and cauda equina: Conus extends to the L1 level.
Increased T2/STIR signal within the lower thoracic cord at the
T11-12 level. Conus and cauda equina appear normal.

Paraspinal and other soft tissues: Posterior paraspinal muscle
atrophy. Postsurgical changes present at the midline of the lumbar
spine. Thin postoperative seroma along the incision site
superficially.

Disc levels:

T11-T12: Sagittal sequences only. Diffuse disc bulge and posterior
element hypertrophy resulting in severe canal stenosis with severe
bilateral foraminal stenosis.

T12-L1: Disc bulge and endplate spurring. Bilateral facet
arthropathy and ligamentum flavum buckling. Moderate canal stenosis
with severe right and mild left foraminal stenosis.

L1-L2: Mild disc bulge and endplate spurring. Mild bilateral facet
arthropathy. Mild canal stenosis with moderate bilateral foraminal
stenosis, right worse than left.

L2-L3: Prior posterior decompression. Disc bulge and endplate
spurring. Bilateral facet arthropathy. No canal stenosis. Mild
bilateral foraminal stenosis.

L3-L4: Prior posterior decompression. Disc bulge and endplate
spurring. Bilateral facet arthropathy. No canal stenosis. Severe
left and mild right foraminal stenosis.

L4-L5: Prior posterior decompression. Diffuse endplate spurring and
moderate bilateral facet arthropathy. No canal stenosis.
Mild-moderate right foraminal stenosis.

L5-S1: Prior posterior decompression. Diffuse endplate spurring with
severe bilateral facet arthropathy. Severe bilateral subarticular
recess stenosis without canal stenosis. Severe bilateral foraminal
stenosis.
IMPRESSION: 1. Advanced multilevel degenerative changes of the lumbar spine with
multifactorial severe canal stenosis at T11-T12 and moderate canal
stenosis at T12-L1. Increased signal within the lower thoracic cord
at this level favored to reflect chronic myelomalacia.
2. Multilevel bilateral foraminal stenosis, severe at T11-T12, L3-4,
and L5-S1.
3. Severe bilateral facet arthropathy of L5-S1 with associated facet
joint effusions, likely reactive.
4. Small amount of fluid within the L5-S1 disc space is favored
degenerative. No secondary bony or soft tissue changes to suggest
discitis. Correlate with serum inflammatory markers if there is
clinical suspicion for infection.

## 2022-04-23 IMAGING — CT CT HEAD W/O CM
4 series · 16 of 47 positions shown, 18 images · non-contrast
Comparison: [DATE].

CLINICAL DATA: Weakness, fall.



[Series 2: head w o · axial · 0.41mm/px · z∈[-22,+88]mm · 7 of 30 slices shown, 9 images]
[im 4/30  brain]
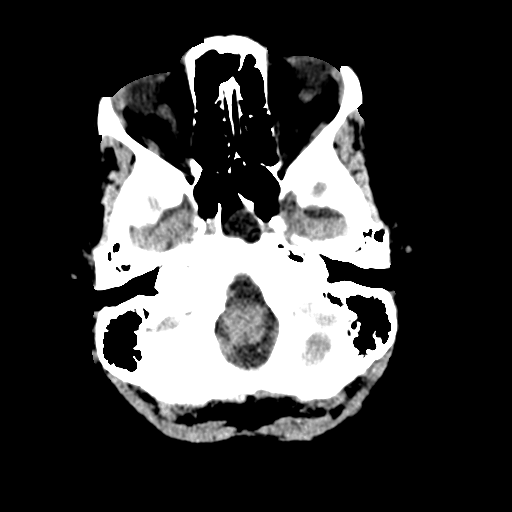
[im 4/30  bone]
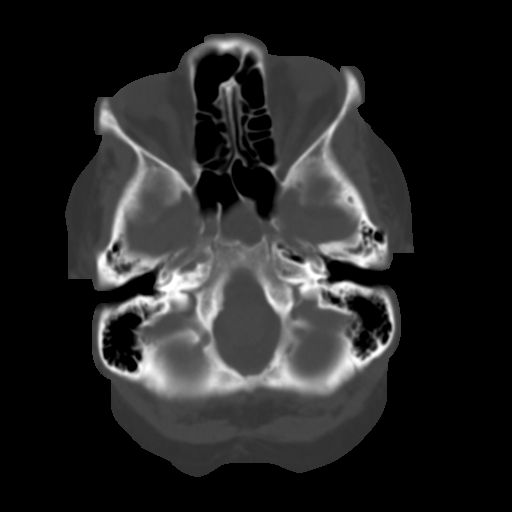
[im 8/30  brain]
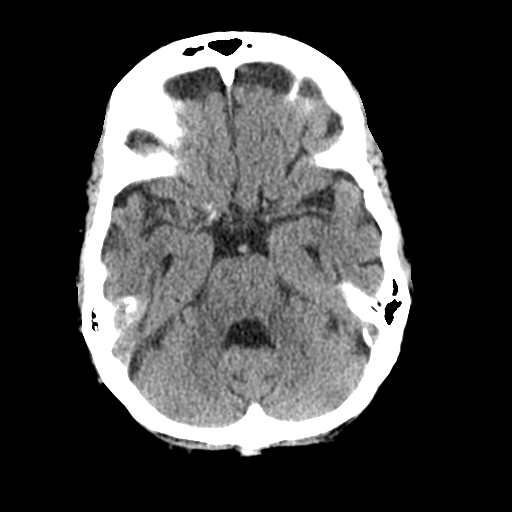
[im 11/30  brain]
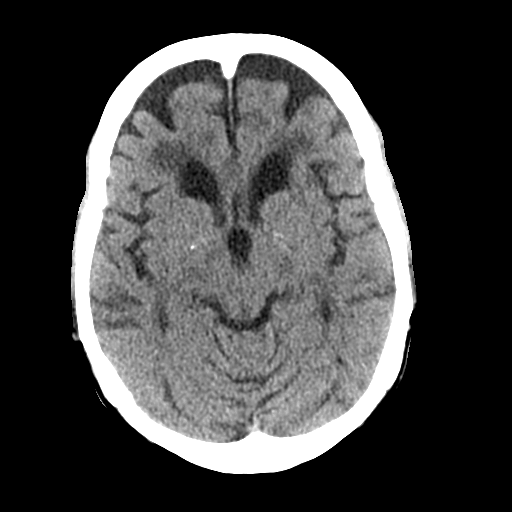
[im 15/30  brain]
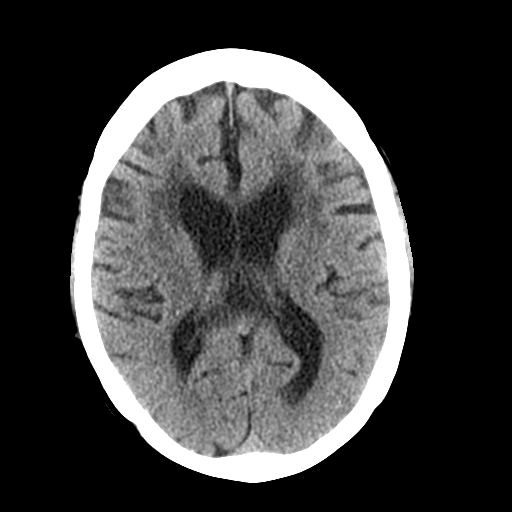
[im 19/30  brain]
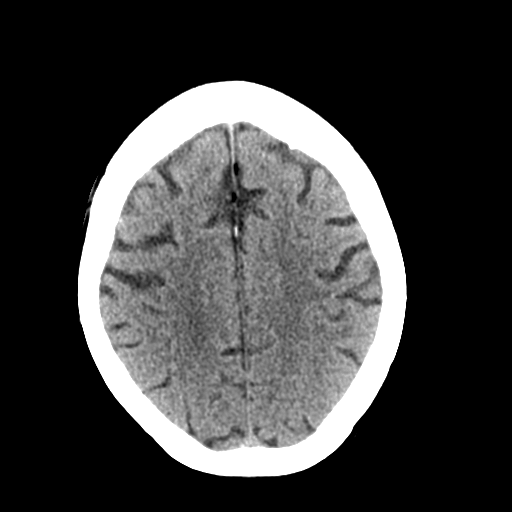
[im 19/30  bone]
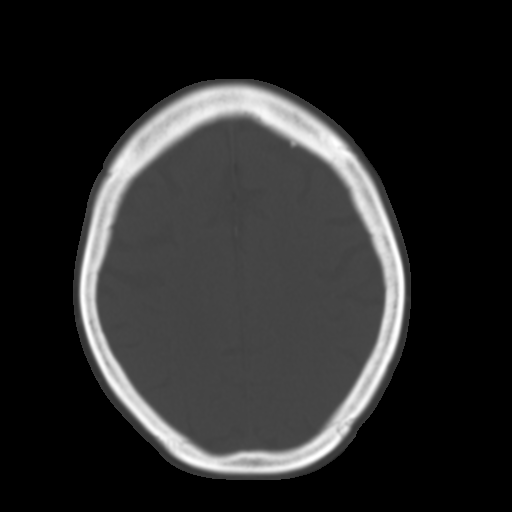
[im 22/30  brain]
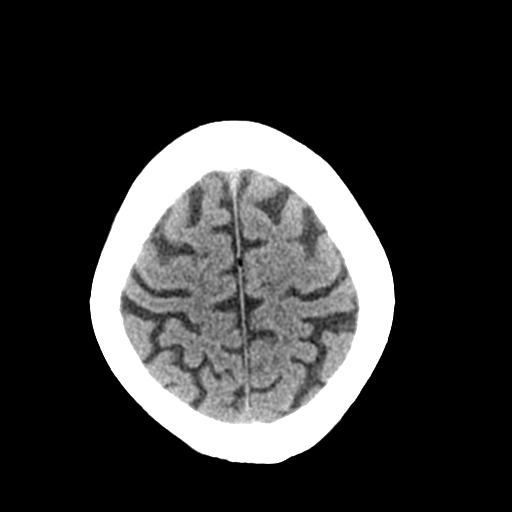
[im 26/30  brain]
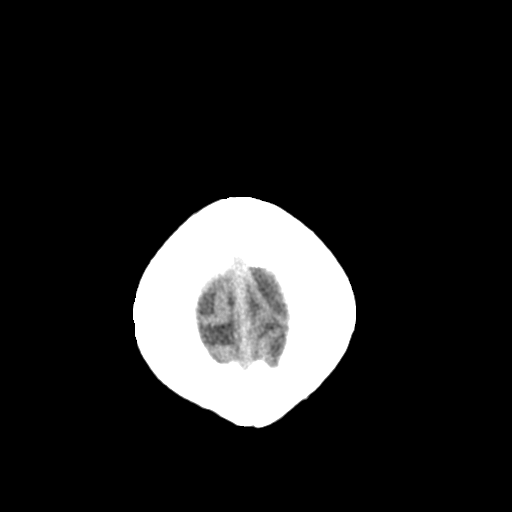

[Series 3: head bone · axial · 0.42mm/px · z∈[-23,+7]mm · 3 of 75 slices shown]
[im 8/75  bone]
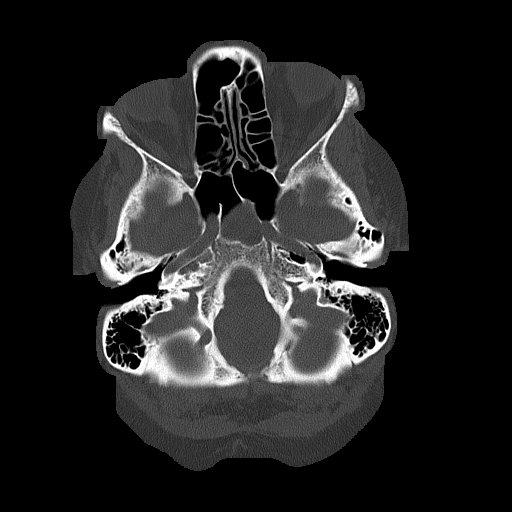
[im 15/75  bone]
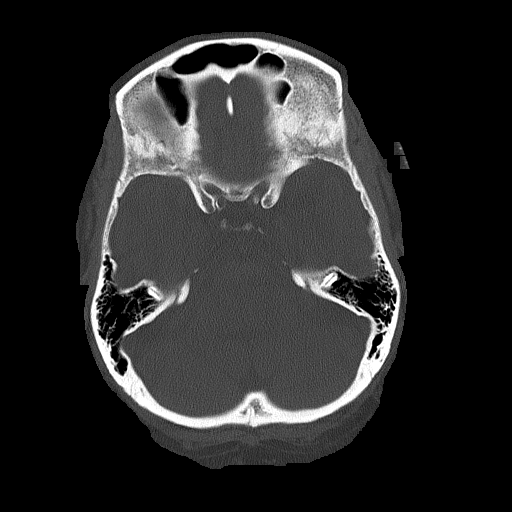
[im 23/75  bone]
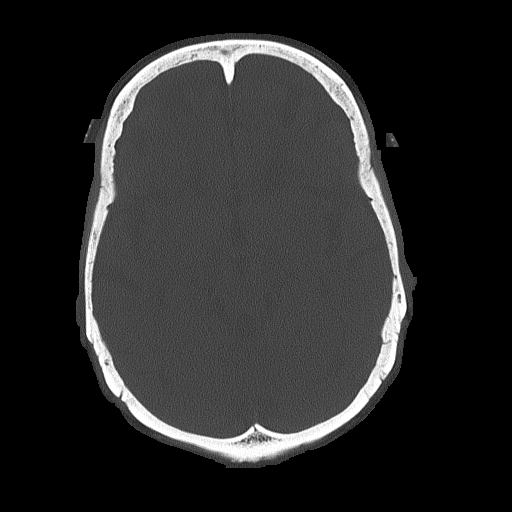

[Series 4: coronal soft · coronal · 0.33mm/px · 3 of 82 slices shown]
[im 28/82  brain]
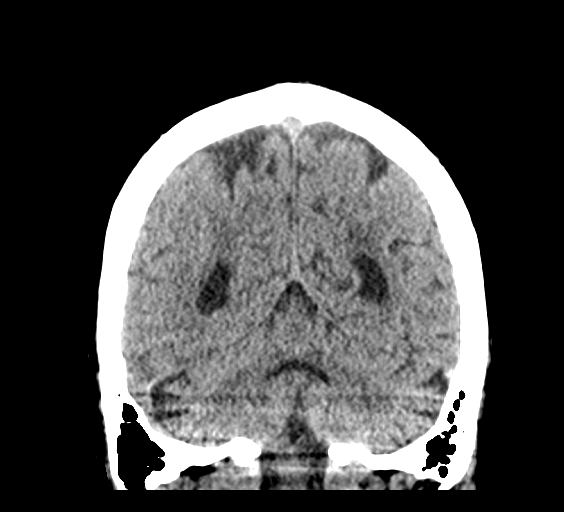
[im 37/82  brain]
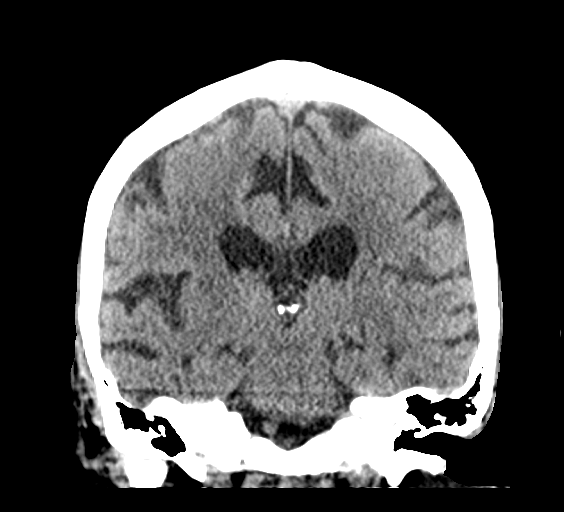
[im 46/82  brain]
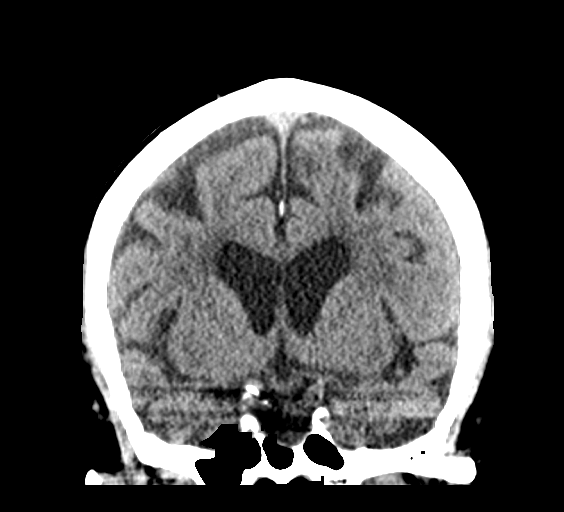

[Series 5: sagittal soft · sagittal · 0.31mm/px · 3 of 82 slices shown]
[im 28/82  brain]
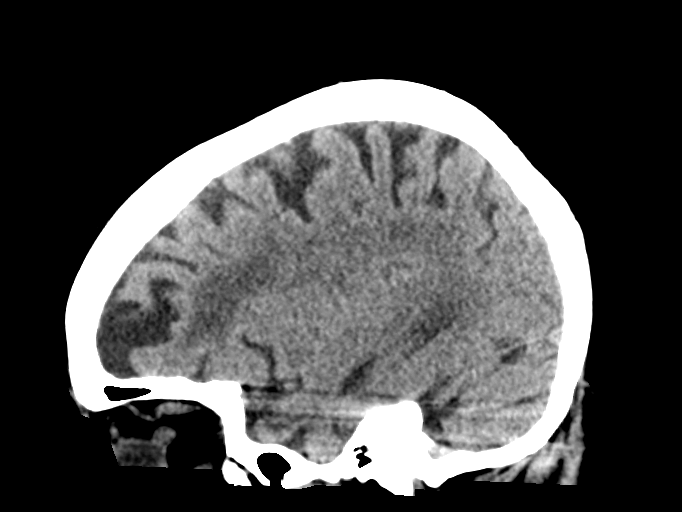
[im 41/82  brain]
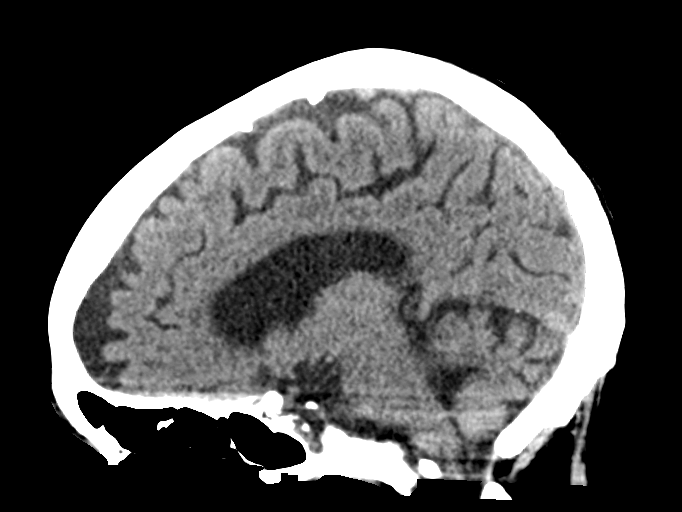
[im 55/82  brain]
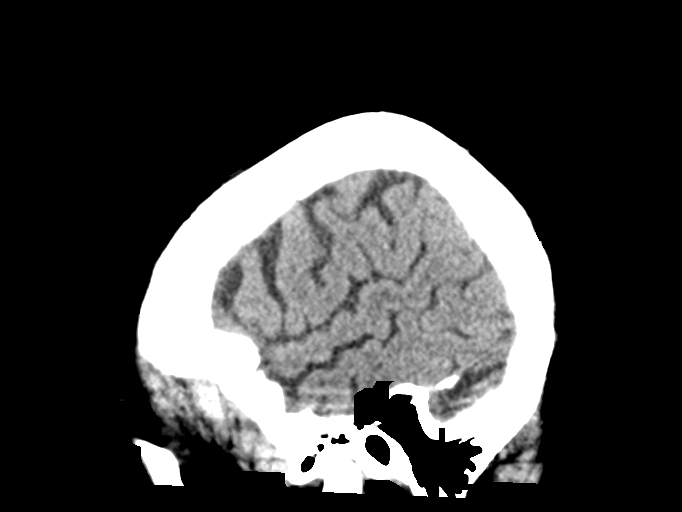

[16 of 47 positions shown; findings below may reference images not displayed]

FINDINGS: Brain: Mild chronic ischemic white matter disease is noted. No mass
effect or midline shift is noted. Ventricular size is within normal
limits. There is no evidence of mass lesion, hemorrhage or acute
infarction.

Vascular: No hyperdense vessel or unexpected calcification.

Skull: Normal. Negative for fracture or focal lesion.

Sinuses/Orbits: No acute finding.

Other: None.
IMPRESSION: No acute intracranial abnormality seen.

## 2022-04-23 MED ORDER — ACETAMINOPHEN 650 MG RE SUPP: 650.0000 mg | Freq: Four times a day (QID) | RECTAL | Status: DC | PRN

## 2022-04-23 MED ORDER — LATANOPROST 0.005 % OP SOLN
1.0000 [drp] | Freq: Every day | OPHTHALMIC | Status: DC
  Administered 2022-04-23 – 2022-04-29 (×7): 1 [drp] via OPHTHALMIC
  Filled 2022-04-23: qty 2.5

## 2022-04-23 MED ORDER — ACETAMINOPHEN 325 MG PO TABS
650.0000 mg | ORAL_TABLET | Freq: Four times a day (QID) | ORAL | Status: DC | PRN
  Administered 2022-04-24 – 2022-04-28 (×9): 650 mg via ORAL
  Filled 2022-04-23 (×9): qty 2

## 2022-04-23 MED ORDER — NIFEDIPINE ER OSMOTIC RELEASE 60 MG PO TB24
90.0000 mg | ORAL_TABLET | Freq: Every day | ORAL | Status: DC
  Administered 2022-04-24 – 2022-04-30 (×7): 90 mg via ORAL
  Filled 2022-04-23 (×7): qty 1

## 2022-04-23 MED ORDER — ADULT MULTIVITAMIN W/MINERALS CH
1.0000 | ORAL_TABLET | Freq: Every day | ORAL | Status: DC
  Administered 2022-04-24 – 2022-04-30 (×7): 1 via ORAL
  Filled 2022-04-23 (×10): qty 1

## 2022-04-23 MED ORDER — ENOXAPARIN SODIUM 40 MG/0.4ML IJ SOSY
40.0000 mg | PREFILLED_SYRINGE | INTRAMUSCULAR | Status: DC
  Administered 2022-04-23 – 2022-04-29 (×7): 40 mg via SUBCUTANEOUS
  Filled 2022-04-23 (×7): qty 0.4

## 2022-04-23 MED ORDER — ONDANSETRON HCL 4 MG/2ML IJ SOLN: 4.0000 mg | Freq: Four times a day (QID) | INTRAMUSCULAR | Status: DC | PRN

## 2022-04-23 MED ORDER — HYDRALAZINE HCL 50 MG PO TABS
50.0000 mg | ORAL_TABLET | Freq: Three times a day (TID) | ORAL | Status: DC
  Administered 2022-04-23 – 2022-04-26 (×8): 50 mg via ORAL
  Filled 2022-04-23 (×2): qty 2
  Filled 2022-04-23 (×7): qty 1

## 2022-04-23 MED ORDER — PRAVASTATIN SODIUM 40 MG PO TABS
40.0000 mg | ORAL_TABLET | Freq: Every day | ORAL | Status: DC
  Administered 2022-04-24 – 2022-04-30 (×7): 40 mg via ORAL
  Filled 2022-04-23 (×7): qty 1

## 2022-04-23 MED ORDER — ONDANSETRON HCL 4 MG PO TABS
4.0000 mg | ORAL_TABLET | Freq: Four times a day (QID) | ORAL | Status: DC | PRN
Start: 2022-04-23 — End: 2022-04-30

## 2022-04-23 MED ORDER — DICLOFENAC SODIUM 1 % EX GEL: 2.0000 g | Freq: Four times a day (QID) | CUTANEOUS | Status: DC | PRN

## 2022-04-23 NOTE — Assessment & Plan Note (Addendum)
Continue statin. 

## 2022-04-23 NOTE — ED Triage Notes (Signed)
Pt from home. Woke and walked to bathroom and back, went to put left leg back on bed and couldn't. States she just couldn't move it and slid to floor on her bottom. Pt able to reach phone and called sons. Pt a/o. Gen weakness with no more weakness noted to left leg than right noted. Pt does c/o left leg weakness with left knee pain. Denies any other symptoms. Cbg 103 en route. Pt has hx of neuropathy.

## 2022-04-23 NOTE — ED Provider Notes (Signed)
[facility] EMERGENCY DEPARTMENT Provider Note   CSN: [id number] Arrival date & time: 04/23/22  1112     History  Chief Complaint  Patient presents with   Extremity Weakness    Kaymarie CHALSEY LEETH is a 81 y.o. female.   Extremity Weakness Pertinent negatives include no headaches and no shortness of breath. Patient presents with weakness of her left leg.  States at around 3 in the morning she got up to go to the bathroom.  States she went to the bathroom okay and when walking back had difficulty getting the bed.  States she uses her walker.  States that she could not get her left leg up.  States normally this is her "good" leg.  Ended up sliding onto the floor.  Has some back pain.  States she did not hit her back or her neck.  States did not hit her head.     Home Medications Prior to Admission medications   Medication Sig Start Date End Date Taking? Authorizing Provider  acetaminophen (TYLENOL) 500 MG tablet Take 100 mg by mouth 2 (two) times daily.   Yes [provider]  Capsaicin (ARTHRITIS PAIN RELIEF EX) Apply 1 application. topically daily as needed (pain relief).   Yes [provider]  hydrALAZINE (APRESOLINE) 50 MG tablet Take 50 mg by mouth 3 (three) times daily. 11/17/20  Yes [provider]  hydrochlorothiazide 50 MG tablet Take 50 mg by mouth daily. 03/11/11  Yes [provider]  latanoprost (XALATAN) 0.005 % ophthalmic solution Place 1 drop into both eyes at bedtime. 01/08/21  Yes [provider]  losartan (COZAAR) 100 MG tablet Take 100 mg by mouth daily.   Yes [provider]  lubiprostone (AMITIZA) 8 MCG capsule Take 1 capsule (8 mcg total) by mouth daily with breakfast. 12/13/19  Yes Annitta Needs, NP  medroxyPROGESTERone (PROVERA) 10 MG tablet TAKE 1 TABLET BY MOUTH DAILY 10 DAYS PER MONTH Patient taking differently: Take 10 mg by mouth See admin instructions. Take for 10 days per month. 01/13/22  Yes Florian Buff, MD   Multiple Vitamins-Minerals (MULTIVITAMIN WITH MINERALS) tablet Take 1 tablet by mouth daily.   Yes [provider]  NIFEdipine (PROCARDIA XL/ADALAT-CC) 90 MG 24 hr tablet Take 90 mg by mouth daily. 05/12/11  Yes [provider]  pravastatin (PRAVACHOL) 40 MG tablet Take 40 mg by mouth daily. 05/12/11  Yes [provider]  VOLTAREN 1 % GEL Apply 2 g topically 4 (four) times daily as needed (pain). Neck/shoulder 01/20/21  Yes [provider]  aspirin 81 MG EC tablet Take 81 mg by mouth daily. Patient not taking: Reported on 04/23/2022    [provider]  CELEBREX 200 MG capsule Take 200 mg by mouth daily. Patient not taking: Reported on 04/23/2022 05/19/11   [provider]  sertraline (ZOLOFT) 100 MG tablet Take 100 mg by mouth daily.  Patient not taking: Reported on 04/23/2022    [provider]  spironolactone (ALDACTONE) 50 MG tablet Take 50 mg by mouth daily. 04/21/22   [provider]      Allergies    Ace inhibitors and Nsaids    Review of Systems   Review of Systems  Constitutional:  Negative for appetite change.  Respiratory:  Negative for shortness of breath.   Genitourinary:  Negative for flank pain.  Musculoskeletal:  Positive for back pain and extremity weakness.  Neurological:  Positive for weakness. Negative for numbness and headaches.  Physical Exam Updated Vital Signs BP (!) 167/55   Pulse 62   Temp 97.7 F (36.5 C) (Oral)   Resp (!) 22   Ht '5\' 1"'$  (1.549 m)   Wt 111.1 kg   SpO2 98%   BMI 46.29 kg/m  Physical Exam Vitals and nursing note reviewed.  Constitutional:      Appearance: She is obese.  HENT:     Head: Normocephalic.  Cardiovascular:     Rate and Rhythm: Regular rhythm.  Abdominal:     Tenderness: There is no abdominal tenderness.  Musculoskeletal:        General: No tenderness.     Cervical back: Neck supple.     Comments: Somewhat decreased straight leg raise on the right.   Patient states is about her baseline however.  Good strength in upper extremities.  However the left lower extremitie has decreased strength.  Decreased flexion and extension at the ankle.  Decreased straight leg raise.  Sensation intact.  Pulse intact.  Does have some lumbar tenderness.  Good passive movement in the left lower extremity without tenderness on hip knee or lower extremity.  Skin:    Capillary Refill: Capillary refill takes less than 2 seconds.  Neurological:     Mental Status: She is oriented to person, place, and time.    ED Results / Procedures / Treatments   Labs (all labs ordered are listed, but only abnormal results are displayed) Labs Reviewed  BASIC METABOLIC PANEL - Abnormal; Notable for the following components:      Result Value   Glucose, Bld 107 (*)    All other components within normal limits  CBC WITH DIFFERENTIAL/PLATELET - Abnormal; Notable for the following components:   Hemoglobin 10.9 (*)    HCT 34.0 (*)    All other components within normal limits  URINALYSIS, ROUTINE W REFLEX MICROSCOPIC    EKG None  Radiology CT Head Wo Contrast  Result Date: 04/23/2022 CLINICAL DATA:  Weakness, fall. EXAM: CT HEAD WITHOUT CONTRAST TECHNIQUE: Contiguous axial images were obtained from the base of the skull through the vertex without intravenous contrast. RADIATION DOSE REDUCTION: This exam was performed according to the departmental dose-optimization program which includes automated exposure control, adjustment of the mA and/or kV according to patient size and/or use of iterative reconstruction technique. COMPARISON:  July 04, 2021. FINDINGS: Brain: Mild chronic ischemic white matter disease is noted. No mass effect or midline shift is noted. Ventricular size is within normal limits. There is no evidence of mass lesion, hemorrhage or acute infarction. Vascular: No hyperdense vessel or unexpected calcification. Skull: Normal. Negative for fracture or focal lesion.  Sinuses/Orbits: No acute finding. Other: None. IMPRESSION: No acute intracranial abnormality seen. Electronically Signed   By: Marijo Conception M.D.   On: 04/23/2022 12:32   CT Lumbar Spine Wo Contrast  Result Date: 04/23/2022 CLINICAL DATA:  Fall, lower back pain EXAM: CT LUMBAR SPINE WITHOUT CONTRAST TECHNIQUE: Multidetector CT imaging of the lumbar spine was performed without intravenous contrast administration. Multiplanar CT image reconstructions were also generated. RADIATION DOSE REDUCTION: This exam was performed according to the departmental dose-optimization program which includes automated exposure control, adjustment of the mA and/or kV according to patient size and/or use of iterative reconstruction technique. COMPARISON:  None Available. FINDINGS: Segmentation: Standard; the lowest formed disc space is designated L5-S1. Alignment: Normal. There is no antero or retrolisthesis. There is no jumped or perched facets or other evidence of traumatic malalignment. Vertebrae: Vertebral body heights are  preserved. There is no acute fracture. There is no suspicious osseous lesion. The bones are diffusely demineralized. Paraspinal and other soft tissues: There is extensive calcified atherosclerotic plaque throughout the nonaneurysmal abdominal aorta. There is thickening of the bilateral adrenal glands without discrete nodule. Are postsurgical changes in the posterior soft tissues from L2 through L5. Disc levels: There is advanced multilevel disc space narrowing throughout the lumbar spine with multilevel vacuum disc phenomenon, most notable at L5-S1. There is advanced multilevel facet arthropathy, most severe in the lower lumbar spine. There is multilevel degenerative endplate change with bulky anterior osteophytes. T12-L1: Is a disc bulge, degenerative endplate change, and bilateral facet arthropathy resulting in mild-to-moderate spinal canal stenosis and moderate right and mild left neural foraminal stenosis  L1-L2: There is a disc bulge, degenerative endplate change, and bilateral facet arthropathy resulting in mild-to-moderate spinal canal stenosis and moderate bilateral neural foraminal stenosis L2-L3: Status post posterior decompression. There is a disc bulge, degenerative endplate change, and bilateral facet arthropathy resulting in mild-to-moderate bilateral neural foraminal stenosis without significant spinal canal stenosis L3-L4: Status post posterior decompression. There is a disc bulge, degenerative endplate change, and bilateral facet arthropathy resulting in moderate left and mild right neural foraminal stenosis without significant spinal canal stenosis L4-L5: Status post posterior decompression. There is a disc bulge, degenerative endplate change, and severe bilateral facet arthropathy resulting in moderate to severe right and mild-to-moderate left neural foraminal stenosis without significant spinal canal stenosis L5-S1: Status post posterior decompression. There is a disc bulge, degenerative endplate change, and severe bilateral facet arthropathy resulting in severe bilateral neural foraminal stenosis without significant spinal canal stenosis. IMPRESSION: 1. No acute fracture or traumatic malalignment of the lumbar spine. 2. Postsurgical changes reflecting posterior decompression from L2-L3 through L5-S1. There is no definite high-grade spinal canal stenosis, but there is multilevel advanced multilevel degenerative change with bilateral neural foraminal stenosis, most advanced at L5-S1. 3.  Aortic Atherosclerosis (ICD10-I70.0). Electronically Signed   By: Valetta Mole M.D.   On: 04/23/2022 12:44    Procedures Procedures    Medications Ordered in ED Medications - No data to display  ED Course/ Medical Decision Making/ A&P                           Medical Decision Making Amount and/or Complexity of Data Reviewed Labs: ordered. Radiology: ordered.  Patient with a rather cute onset of left  lower extremity weakness.  Difficulty with raising her leg.  Loss of flexion extension at the ankle.  Sensation intact.  Does have some lumbar tenderness.  CT scan done due to back pain with it.  Does have degenerative changes with foraminal stenosis.  However no high-grade spinal canal stenosis.  Head CT also done and reassuring.  Independently interpreted and showed no acute abnormality.  Patient ambulatory at baseline with a walker but now cannot move well enough to be able to use a walker.  With acute onset of neurodeficit I doubt stroke that would be large enough for a large vessel occlusion and not a tPA candidate due to the time of onset of 3 in the morning.  Will admit to internal medicine for further evaluation.           Final Clinical Impression(s) / ED Diagnoses Final diagnoses:  Weakness of left lower extremity    Rx / DC Orders ED Discharge Orders     None         Davonna Belling,  MD 04/23/22 1431

## 2022-04-23 NOTE — ED Notes (Signed)
Peri care given , applied Pure wick

## 2022-04-23 NOTE — Assessment & Plan Note (Signed)
Continue Zoloft 

## 2022-04-23 NOTE — Assessment & Plan Note (Addendum)
Continue nifedipine, hydralazine

## 2022-04-23 NOTE — H&P (Signed)
History and Physical    Patient: Shelley Mitchell PXT:[id number] DOB: [birth year] DOA: 04/23/2022 DOS: the patient was seen and examined on 04/23/2022 PCP: [name], MD  Patient coming from: Home  Chief Complaint:  Chief Complaint  Patient presents with   Extremity Weakness   HPI: Shelley Mitchell is a 81 year old female with a history of hypertension, hyperlipidemia, anxiety, chronic back pain presenting with left lower extremity weakness that began in the early morning 04/23/2022.  Apparently, the patient stated that she got up to go to the bathroom around 3 AM on 04/23/2022.  At baseline, the patient is able to get out of bed and use a walker to assist with ambulation.  The patient was going back to her bedroom to get back into bed when she noticed that her left leg was weaker than usual having difficulty lifting up her left leg onto the bed.  She called one of her sons who helped her to get back into bed.  She went back to sleep and subsequently waited until around 9 AM to activate EMS.  Around 9 AM, she noted that she was still having some difficulty moving her left leg in bed.  She denied any visual disturbance, dysarthria, word finding difficulties, visual disturbance, or upper extremity weakness.  She denies any recent fall or trauma to her back or legs.  She denies any worsening back pain.  She states that she normally has some chronic low-level back pain at baseline.  She denies any bowel or bladder incontinence.  She denies any saddle anesthesias.  She has not started on any new medications.  Patient denies fevers, chills, headache, chest pain, dyspnea, nausea, vomiting, diarrhea, abdominal pain, dysuria, hematuria, hematochezia, and melena.  Notably, the patient feels that her left leg weakness is a bit better in the emergency department. In the ED, the patient was afebrile and hemodynamically stable with oxygen saturation 98% on room air.  BMP showed sodium 138, potassium 3.9, bicarbonate 25, BUN  19, creatinine 0.67.  WBC 6.0, hemoglobin 10.9, platelets 217,000.  Urinalysis was negative.  EKG shows sinus rhythm with nonspecific T wave change.  CT of the brain was negative for any acute findings.  CT of the lumbar spine showed postsurgical changes status post decompression L2-3, and L5-S1.  There is no high-grade canal stenosis.  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  Admission was requested for further work-up and evaluation of her leg weakness.  Review of Systems: As mentioned in the history of present illness. All other systems reviewed and are negative. Past Medical History:  Diagnosis Date   Endometrial polyp    Dr. Elonda Husky   History of endometrial hyperplasia    HTN (hypertension)    Hx of adenomatous colonic polyps 1999   Hyperlipidemia    Mental disorder    GAD   Obesity    Osteoarthritis    Tachycardia    subsequently with bradycardia (?secondary to meds)   Past Surgical History:  Procedure Laterality Date   BACK SURGERY     bilateral knee replacements  2002/2003   CHOLECYSTECTOMY     COLONOSCOPY  03/2002   Dr. Jamesetta Geralds internal hemorrhoids   COLONOSCOPY  06/21/2011   polyp at IC valve (tubular adenoma)   COLONOSCOPY N/A 07/15/2016   one 8 mm polyp at hepatic flexure (tubular adenoma), on 12 mm polyp at hepatic flexure (tubular adenoma), 3 year surveillance if health permits   COLONOSCOPY N/A 10/10/2019   descending  colon diverticulosis, two 4-5 mm polyps at splenic flexure (tubular adenomas). No surveillance due to age.    POLYPECTOMY  07/15/2016   Procedure: POLYPECTOMY;  Surgeon: Daneil Dolin, MD;  Location: [facility];  Service: Endoscopy;;  Splenic Flexure polyps x 2 removed via hot snare   POLYPECTOMY  10/10/2019   Procedure: POLYPECTOMY;  Surgeon: Daneil Dolin, MD;  Location: [facility];  Service: Endoscopy;;   TUBAL LIGATION     Social History:  reports that she has never smoked. She has never used  smokeless tobacco. She reports that she does not drink alcohol and does not use drugs.  Allergies  Allergen Reactions   Ace Inhibitors Itching   Nsaids Itching    Family History  Problem Relation Age of Onset   Diabetes Brother    Colon polyps Brother    Diabetes Sister    Cancer Father        Likely prostate   Stroke Mother 58   Colon cancer Neg Hx     Prior to Admission medications   Medication Sig Start Date End Date Taking? Authorizing Provider  acetaminophen (TYLENOL) 500 MG tablet Take 100 mg by mouth 2 (two) times daily.   Yes [provider]  Capsaicin (ARTHRITIS PAIN RELIEF EX) Apply 1 application. topically daily as needed (pain relief).   Yes [provider]  hydrALAZINE (APRESOLINE) 50 MG tablet Take 50 mg by mouth 3 (three) times daily. 11/17/20  Yes [provider]  hydrochlorothiazide 50 MG tablet Take 50 mg by mouth daily. 03/11/11  Yes [provider]  latanoprost (XALATAN) 0.005 % ophthalmic solution Place 1 drop into both eyes at bedtime. 01/08/21  Yes [provider]  losartan (COZAAR) 100 MG tablet Take 100 mg by mouth daily.   Yes [provider]  lubiprostone (AMITIZA) 8 MCG capsule Take 1 capsule (8 mcg total) by mouth daily with breakfast. 12/13/19  Yes Annitta Needs, NP  medroxyPROGESTERone (PROVERA) 10 MG tablet TAKE 1 TABLET BY MOUTH DAILY 10 DAYS PER MONTH Patient taking differently: Take 10 mg by mouth See admin instructions. Take for 10 days per month. 01/13/22  Yes Florian Buff, MD  Multiple Vitamins-Minerals (MULTIVITAMIN WITH MINERALS) tablet Take 1 tablet by mouth daily.   Yes [provider]  NIFEdipine (PROCARDIA XL/ADALAT-CC) 90 MG 24 hr tablet Take 90 mg by mouth daily. 05/12/11  Yes [provider]  pravastatin (PRAVACHOL) 40 MG tablet Take 40 mg by mouth daily. 05/12/11  Yes [provider]  VOLTAREN 1 % GEL Apply 2 g topically 4 (four) times daily as needed (pain).  Neck/shoulder 01/20/21  Yes [provider]  aspirin 81 MG EC tablet Take 81 mg by mouth daily. Patient not taking: Reported on 04/23/2022    [provider]  CELEBREX 200 MG capsule Take 200 mg by mouth daily. Patient not taking: Reported on 04/23/2022 05/19/11   [provider]  sertraline (ZOLOFT) 100 MG tablet Take 100 mg by mouth daily.  Patient not taking: Reported on 04/23/2022    [provider]  spironolactone (ALDACTONE) 50 MG tablet Take 50 mg by mouth daily. 04/21/22   [provider]    Physical Exam: Vitals:   04/23/22 1230 04/23/22 1300 04/23/22 1330 04/23/22 1400  BP: (!) 168/80 (!) 167/55 (!) 175/91 (!) 151/75  Pulse: 75 62 75 65  Resp:  (!) '22 17 18  '$ Temp:      TempSrc:  SpO2: 96% 98% 99% 99%  Weight:      Height:       GENERAL:  A&O x 3, NAD, well developed, cooperative, follows commands HEENT: [place]/AT, No thrush, No icterus, No oral ulcers Neck:  No neck mass, No meningismus, soft, supple CV: RRR, no S3, no S4, no rub, no JVD Lungs:  CTA, no wheeze, no rhonchi, good air movement Abd: soft/NT +BS, nondistended Ext: No edema, no lymphangitis, no cyanosis, no rashes Neuro:  CN II-XII intact, strength 3+/5 in RUE, RLE, strength 3+/5 LUE, LLE; sensation intact bilateral; no dysmetria; babinski equivocal  Data Reviewed: Data reviewed in history  Assessment and Plan: * Leg weakness Requesting telemetry neurology consultation -MRI lumbar spine--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 Cabbell>>transfer to [facility] for [facility]surgery consult  Anxiety Continue Zoloft  Hyperlipidemia Continue statin  Essential hypertension Continue nifedipine, hydralazine      Advance Care Planning: FULL  Consults:  neurosurgery--Cabbell  Family Communication: sons updated 5/19  Severity of Illness: The appropriate patient status for this patient is INPATIENT. Inpatient status is judged to be reasonable and necessary in order to provide the required intensity of service to ensure the patient's safety. The patient's presenting symptoms, physical exam findings, and initial radiographic and laboratory data in the context of their chronic comorbidities is felt to place them at high risk for further clinical deterioration. Furthermore, it is not anticipated that the patient will be medically stable for discharge from the hospital within 2 midnights of admission.   * I certify that at the point of admission it is my clinical judgment that the patient will require inpatient hospital care spanning beyond 2 midnights from the point of admission due to high intensity of service, high risk for further deterioration and high frequency of surveillance required.*  Author: Orson Eva, MD 04/23/2022 6:47 PM  For on call review [email or web address].

## 2022-04-23 NOTE — Assessment & Plan Note (Addendum)
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 Cabbell>>transfer to [facility] for neurosurgery consult

## 2022-04-23 NOTE — ED Notes (Addendum)
Patient reassessed pain has improved. Patient is now able to move and raise left leg. Repositioned in bed, peri care given  and provided warm blanket.

## 2022-04-23 NOTE — Hospital Course (Signed)
81 year old female with a history of hypertension, hyperlipidemia, anxiety, chronic back pain presenting with left lower extremity weakness that began in the early morning 04/23/2022.  Apparently, the patient stated that she got up to go to the bathroom around 3 AM on 04/23/2022.  At baseline, the patient is able to get out of bed and use a walker to assist with ambulation.  The patient was going back to her bedroom to get back into bed when she noticed that her left leg was weaker than usual having difficulty lifting up her left leg onto the bed.  She called one of her sons who helped her to get back into bed.  She went back to sleep and subsequently waited until around 9 AM to activate EMS.  Around 9 AM, she noted that she was still having some difficulty moving her left leg in bed.  She denied any visual disturbance, dysarthria, word finding difficulties, visual disturbance, or upper extremity weakness.  She denies any recent fall or trauma to her back or legs.  She denies any worsening back pain.  She states that she normally has some chronic low-level back pain at baseline.  She denies any bowel or bladder incontinence.  She denies any saddle anesthesias.  She has not started on any new medications.  Patient denies fevers, chills, headache, chest pain, dyspnea, nausea, vomiting, diarrhea, abdominal pain, dysuria, hematuria, hematochezia, and melena.  Notably, the patient feels that her left leg weakness is a bit better in the emergency department. In the ED, the patient was afebrile and hemodynamically stable with oxygen saturation 98% on room air.  BMP showed sodium 138, potassium 3.9, bicarbonate 25, BUN 19, creatinine 0.67.  WBC 6.0, hemoglobin 10.9, platelets 217,000.  Urinalysis was negative.  EKG shows sinus rhythm with nonspecific T wave change.  CT of the brain was negative for any acute findings.  CT of the lumbar spine showed postsurgical changes status post decompression L2-3, and L5-S1.  There is no  high-grade canal stenosis.  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  Admission was requested for further work-up and evaluation of her leg weakness.

## 2022-04-24 DIAGNOSIS — E783 Hyperchylomicronemia: Secondary | ICD-10-CM | POA: Diagnosis not present

## 2022-04-24 LAB — ABO/RH: ABO/RH(D): A POS

## 2022-04-24 LAB — LIPID PANEL
Cholesterol: 160 mg/dL (ref 0–200)
HDL: 67 mg/dL (ref >40)
LDL Cholesterol: 76 mg/dL (ref 0–99)
Total CHOL/HDL Ratio: 2.4 RATIO
Triglycerides: 83 mg/dL (ref <150)
VLDL: 17 mg/dL (ref 0–40)

## 2022-04-24 LAB — PROCALCITONIN: Procalcitonin: <0.1 ng/mL

## 2022-04-24 LAB — C-REACTIVE PROTEIN: CRP: 0.6 mg/dL (ref <1.0)

## 2022-04-24 LAB — BASIC METABOLIC PANEL
Anion gap: 8 (ref 5–15)
BUN: 14 mg/dL (ref 8–23)
CO2: 24 mmol/L (ref 22–32)
Calcium: 9.3 mg/dL (ref 8.9–10.3)
Chloride: 103 mmol/L (ref 98–111)
Creatinine, Ser: 0.69 mg/dL (ref 0.44–1.00)
GFR, Estimated: >60 mL/min (ref >60)
Glucose, Bld: 111 mg/dL — ABNORMAL HIGH (ref 70–99)
Potassium: 3.8 mmol/L (ref 3.5–5.1)
Sodium: 135 mmol/L (ref 135–145)

## 2022-04-24 LAB — HEMOGLOBIN A1C
Hgb A1c MFr Bld: 5.6 % (ref 4.8–5.6)
Mean Plasma Glucose: 114.02 mg/dL

## 2022-04-24 LAB — TYPE AND SCREEN
ABO/RH(D): A POS
Antibody Screen: NEGATIVE

## 2022-04-24 LAB — PROTIME-INR
INR: 1 (ref 0.8–1.2)
Prothrombin Time: 13.3 seconds (ref 11.4–15.2)

## 2022-04-24 LAB — SEDIMENTATION RATE: Sed Rate: 47 mm/hr — ABNORMAL HIGH (ref 0–22)

## 2022-04-24 MED ORDER — FUROSEMIDE 40 MG PO TABS
40.0000 mg | ORAL_TABLET | Freq: Once | ORAL | Status: AC
  Administered 2022-04-24: 40 mg via ORAL
  Filled 2022-04-24: qty 1

## 2022-04-24 MED ORDER — POTASSIUM CHLORIDE CRYS ER 20 MEQ PO TBCR
40.0000 meq | EXTENDED_RELEASE_TABLET | Freq: Once | ORAL | Status: AC
  Administered 2022-04-24: 40 meq via ORAL
  Filled 2022-04-24: qty 2

## 2022-04-24 MED ORDER — LORAZEPAM 2 MG/ML IJ SOLN
1.0000 mg | Freq: Once | INTRAMUSCULAR | Status: AC | PRN
  Administered 2022-04-24: 1 mg via INTRAVENOUS
  Filled 2022-04-24: qty 1

## 2022-04-24 NOTE — Progress Notes (Signed)
Security paged and called to get patient belongings of the three rings patient wants locked up in the safe

## 2022-04-24 NOTE — Consult Note (Signed)
Reason for Consult:lower extremity weakness, possible thoracic stenosis on incomplete MRI Referring Physician: Arlina, Shelley Mitchell is an 81 y.o. female.  HPI: whom on 5/19 while transferring from a bedside commode at home to her bed just felt the left lower extremity give way. She needed help from her son to actually get in the bed. In the morning yesterday she called EMS secondarily to greater weakness. The strength was not associated with pain. She was admitted to [facility]. An MRI lumbar spine showed probable severe stenosis and cord signal abnormality at T11/12. I was contacted and requested that patient be transferred for evaluation and possible operative decompression.   Past Medical History:  Diagnosis Date   Endometrial polyp    Dr. Elonda Husky   History of endometrial hyperplasia    HTN (hypertension)    Hx of adenomatous colonic polyps 1999   Hyperlipidemia    Mental disorder    GAD   Obesity    Osteoarthritis    Tachycardia    subsequently with bradycardia (?secondary to meds)    Past Surgical History:  Procedure Laterality Date   BACK SURGERY     bilateral knee replacements  2002/2003   CHOLECYSTECTOMY     COLONOSCOPY  03/2002   Dr. Jamesetta Geralds internal hemorrhoids   COLONOSCOPY  06/21/2011   polyp at IC valve (tubular adenoma)   COLONOSCOPY N/A 07/15/2016   one 8 mm polyp at hepatic flexure (tubular adenoma), on 12 mm polyp at hepatic flexure (tubular adenoma), 3 year surveillance if health permits   COLONOSCOPY N/A 10/10/2019   descending colon diverticulosis, two 4-5 mm polyps at splenic flexure (tubular adenomas). No surveillance due to age.    POLYPECTOMY  07/15/2016   Procedure: POLYPECTOMY;  Surgeon: Daneil Dolin, MD;  Location: [facility];  Service: Endoscopy;;  Splenic Flexure polyps x 2 removed via hot snare   POLYPECTOMY  10/10/2019   Procedure: POLYPECTOMY;  Surgeon: Daneil Dolin, MD;  Location: [facility];  Service: Endoscopy;;   TUBAL  LIGATION      Family History  Problem Relation Age of Onset   Diabetes Brother    Colon polyps Brother    Diabetes Sister    Cancer Father        Likely prostate   Stroke Mother 10   Colon cancer Neg Hx     Social History:  reports that she has never smoked. She has never used smokeless tobacco. She reports that she does not drink alcohol and does not use drugs.  Allergies:  Allergies  Allergen Reactions   Ace Inhibitors Itching   Nsaids Itching    Medications: I have reviewed the patient's current medications.  Results for orders placed or performed during the hospital encounter of 04/23/22 (from the past 48 hour(s))  Urinalysis, Routine w reflex microscopic Urine, Clean Catch     Status: None   Collection Time: 04/23/22 11:45 AM  Result Value Ref Range   Color, Urine YELLOW YELLOW   APPearance CLEAR CLEAR   Specific Gravity, Urine 1.010 1.005 - 1.030   pH 7.0 5.0 - 8.0   Glucose, UA NEGATIVE NEGATIVE mg/dL   Hgb urine dipstick NEGATIVE NEGATIVE   Bilirubin Urine NEGATIVE NEGATIVE   Ketones, ur NEGATIVE NEGATIVE mg/dL   Protein, ur NEGATIVE NEGATIVE mg/dL   Nitrite NEGATIVE NEGATIVE   Leukocytes,Ua NEGATIVE NEGATIVE    Comment: Performed at [facility], [address], [place], [place] [address]  Basic metabolic panel  Status: Abnormal   Collection Time: 04/23/22 11:57 AM  Result Value Ref Range   Sodium 138 135 - 145 mmol/L   Potassium 3.9 3.5 - 5.1 mmol/L   Chloride 106 98 - 111 mmol/L   CO2 25 22 - 32 mmol/L   Glucose, Bld 107 (H) 70 - 99 mg/dL    Comment: Glucose reference range applies only to samples taken after fasting for at least 8 hours.   BUN 19 8 - 23 mg/dL   Creatinine, Ser 0.67 0.44 - 1.00 mg/dL   Calcium 9.5 8.9 - 10.3 mg/dL   GFR, Estimated >60 >60 mL/min    Comment: (NOTE) Calculated using the CKD-EPI Creatinine Equation (2021)    Anion gap 7 5 - 15    Comment: Performed at [facility], [address], [place], [place] [address]  CBC  with Differential     Status: Abnormal   Collection Time: 04/23/22 11:57 AM  Result Value Ref Range   WBC 6.0 4.0 - 10.5 K/uL   RBC 3.87 3.87 - 5.11 MIL/uL   Hemoglobin 10.9 (L) 12.0 - 15.0 g/dL   HCT 34.0 (L) 36.0 - 46.0 %   MCV 87.9 80.0 - 100.0 fL   MCH 28.2 26.0 - 34.0 pg   MCHC 32.1 30.0 - 36.0 g/dL   RDW 15.2 11.5 - 15.5 %   Platelets 217 150 - 400 K/uL   nRBC 0.0 0.0 - 0.2 %   Neutrophils Relative % 69 %   Neutro Abs 4.1 1.7 - 7.7 K/uL   Lymphocytes Relative 18 %   Lymphs Abs 1.1 0.7 - 4.0 K/uL   Monocytes Relative 11 %   Monocytes Absolute 0.7 0.1 - 1.0 K/uL   Eosinophils Relative 1 %   Eosinophils Absolute 0.1 0.0 - 0.5 K/uL   Basophils Relative 1 %   Basophils Absolute 0.0 0.0 - 0.1 K/uL   Immature Granulocytes 0 %   Abs Immature Granulocytes 0.01 0.00 - 0.07 K/uL    Comment: Performed at [facility], [address], [place], [place] [address]  CK     Status: None   Collection Time: 04/23/22 10:38 PM  Result Value Ref Range   Total CK 98 38 - 234 U/L    Comment: Performed at [facility], [address] [place], [place] [address]  Magnesium     Status: None   Collection Time: 04/23/22 10:38 PM  Result Value Ref Range   Magnesium 2.3 1.7 - 2.4 mg/dL    Comment: Performed at [facility], [address], [place], [place] [address]  Protime-INR     Status: None   Collection Time: 04/24/22  6:31 AM  Result Value Ref Range   Prothrombin Time 13.3 11.4 - 15.2 seconds   INR 1.0 0.8 - 1.2    Comment: (NOTE) INR goal varies based on device and disease states. Performed at [facility], [place] [address], [place], [place] [address]   Type and screen     Status: None   Collection Time: 04/24/22  6:31 AM  Result Value Ref Range   ABO/RH(D) A POS    Antibody Screen NEG    Sample Expiration      04/27/2022,2359 Performed at [facility], [place] [address], [place],  [address]   ABO/Rh     Status: None   Collection Time: 04/24/22  7:00 AM  Result Value  Ref Range   ABO/RH(D)      A POS Performed at  [facility], [place] [address] [place], [place] [address]     CT Head Wo Contrast  Result Date: 04/23/2022 CLINICAL DATA:  Weakness, fall. EXAM: CT HEAD WITHOUT CONTRAST TECHNIQUE: Contiguous axial images were obtained from the base of the skull through the vertex without intravenous contrast. RADIATION DOSE REDUCTION: This exam was performed according to the departmental dose-optimization program which includes automated exposure control, adjustment of the mA and/or kV according to patient size and/or use of iterative reconstruction technique. COMPARISON:  July 04, 2021. FINDINGS: Brain: Mild chronic ischemic white matter disease is noted. No mass effect or midline shift is noted. Ventricular size is within normal limits. There is no evidence of mass lesion, hemorrhage or acute infarction. Vascular: No hyperdense vessel or unexpected calcification. Skull: Normal. Negative for fracture or focal lesion. Sinuses/Orbits: No acute finding. Other: None. IMPRESSION: No acute intracranial abnormality seen. Electronically Signed   By: Marijo Conception M.D.   On: 04/23/2022 12:32   CT Lumbar Spine Wo Contrast  Result Date: 04/23/2022 CLINICAL DATA:  Fall, lower back pain EXAM: CT LUMBAR SPINE WITHOUT CONTRAST TECHNIQUE: Multidetector CT imaging of the lumbar spine was performed without intravenous contrast administration. Multiplanar CT image reconstructions were also generated. RADIATION DOSE REDUCTION: This exam was performed according to the departmental dose-optimization program which includes automated exposure control, adjustment of the mA and/or kV according to patient size and/or use of iterative reconstruction technique. COMPARISON:  None Available. FINDINGS: Segmentation: Standard; the lowest formed disc space is designated L5-S1. Alignment: Normal. There is no antero or retrolisthesis. There is no jumped or perched facets or other evidence of traumatic  malalignment. Vertebrae: Vertebral body heights are preserved. There is no acute fracture. There is no suspicious osseous lesion. The bones are diffusely demineralized. Paraspinal and other soft tissues: There is extensive calcified atherosclerotic plaque throughout the nonaneurysmal abdominal aorta. There is thickening of the bilateral adrenal glands without discrete nodule. Are postsurgical changes in the posterior soft tissues from L2 through L5. Disc levels: There is advanced multilevel disc space narrowing throughout the lumbar spine with multilevel vacuum disc phenomenon, most notable at L5-S1. There is advanced multilevel facet arthropathy, most severe in the lower lumbar spine. There is multilevel degenerative endplate change with bulky anterior osteophytes. T12-L1: Is a disc bulge, degenerative endplate change, and bilateral facet arthropathy resulting in mild-to-moderate spinal canal stenosis and moderate right and mild left neural foraminal stenosis L1-L2: There is a disc bulge, degenerative endplate change, and bilateral facet arthropathy resulting in mild-to-moderate spinal canal stenosis and moderate bilateral neural foraminal stenosis L2-L3: Status post posterior decompression. There is a disc bulge, degenerative endplate change, and bilateral facet arthropathy resulting in mild-to-moderate bilateral neural foraminal stenosis without significant spinal canal stenosis L3-L4: Status post posterior decompression. There is a disc bulge, degenerative endplate change, and bilateral facet arthropathy resulting in moderate left and mild right neural foraminal stenosis without significant spinal canal stenosis L4-L5: Status post posterior decompression. There is a disc bulge, degenerative endplate change, and severe bilateral facet arthropathy resulting in moderate to severe right and mild-to-moderate left neural foraminal stenosis without significant spinal canal stenosis L5-S1: Status post posterior  decompression. There is a disc bulge, degenerative endplate change, and severe bilateral facet arthropathy resulting in severe bilateral neural foraminal stenosis without significant spinal canal stenosis. IMPRESSION: 1. No acute fracture or traumatic malalignment of the lumbar spine. 2. Postsurgical changes reflecting posterior decompression from L2-L3 through L5-S1. There is no definite high-grade spinal canal stenosis, but there  is multilevel advanced multilevel degenerative change with bilateral neural foraminal stenosis, most advanced at L5-S1. 3.  Aortic Atherosclerosis (ICD10-I70.0). Electronically Signed   By: Valetta Mole M.D.   On: 04/23/2022 12:44   MR BRAIN WO CONTRAST  Result Date: 04/23/2022 CLINICAL DATA:  Neuro deficit, acute, stroke suspected; left leg weak EXAM: MRI HEAD WITHOUT CONTRAST TECHNIQUE: Multiplanar, multiecho pulse sequences of the brain and surrounding structures were obtained without intravenous contrast. COMPARISON:  None Available. FINDINGS: Brain: There is no acute infarction or intracranial hemorrhage. There is no intracranial mass, mass effect, or edema. There is no hydrocephalus or extra-axial fluid collection. Prominence of the ventricles and sulci reflects generalized parenchymal volume loss. Patchy and confluent areas of T2 hyperintensity in the supratentorial and pontine white matter are nonspecific may reflect moderate chronic microvascular ischemic changes. Vascular: Major vessel flow voids at the skull base are preserved. Skull and upper cervical spine: Normal marrow signal is preserved. Sinuses/Orbits: Paranasal sinuses are aerated. Orbits are unremarkable. Other: Expanded, "empty" sella.  Mastoid air cells are clear. IMPRESSION: No acute infarction, hemorrhage, or mass. Moderate chronic microvascular ischemic changes. Electronically Signed   By: Macy Mis M.D.   On: 04/23/2022 16:33   MR LUMBAR SPINE WO CONTRAST  Result Date: 04/23/2022 CLINICAL DATA:  Low  back pain, prior surgery, new symptoms left leg weakness EXAM: MRI LUMBAR SPINE WITHOUT CONTRAST TECHNIQUE: Multiplanar, multisequence MR imaging of the lumbar spine was performed. No intravenous contrast was administered. COMPARISON:  CT 04/23/2022 FINDINGS: Segmentation:  Standard. Alignment:  Grade 1 anterolisthesis of L4 on L5. Vertebrae: No acute fracture. No suspicious bone lesion. Bilateral facet joint effusions at L5-S1. Small amount of fluid within the L5-S1 disc space without associated endplate marrow edema. Vacuum disc phenomenon is present at this level. No paravertebral inflammatory changes or other evidence to suggest discitis. Conus medullaris and cauda equina: Conus extends to the L1 level. Increased T2/STIR signal within the lower thoracic cord at the T11-12 level. Conus and cauda equina appear normal. Paraspinal and other soft tissues: Posterior paraspinal muscle atrophy. Postsurgical changes present at the midline of the lumbar spine. Thin postoperative seroma along the incision site superficially. Disc levels: T11-T12: Sagittal sequences only. Diffuse disc bulge and posterior element hypertrophy resulting in severe canal stenosis with severe bilateral foraminal stenosis. T12-L1: Disc bulge and endplate spurring. Bilateral facet arthropathy and ligamentum flavum buckling. Moderate canal stenosis with severe right and mild left foraminal stenosis. L1-L2: Mild disc bulge and endplate spurring. Mild bilateral facet arthropathy. Mild canal stenosis with moderate bilateral foraminal stenosis, right worse than left. L2-L3: Prior posterior decompression. Disc bulge and endplate spurring. Bilateral facet arthropathy. No canal stenosis. Mild bilateral foraminal stenosis. L3-L4: Prior posterior decompression. Disc bulge and endplate spurring. Bilateral facet arthropathy. No canal stenosis. Severe left and mild right foraminal stenosis. L4-L5: Prior posterior decompression. Diffuse endplate spurring and  moderate bilateral facet arthropathy. No canal stenosis. Mild-moderate right foraminal stenosis. L5-S1: Prior posterior decompression. Diffuse endplate spurring with severe bilateral facet arthropathy. Severe bilateral subarticular recess stenosis without canal stenosis. Severe bilateral foraminal stenosis. IMPRESSION: 1. 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 2. Multilevel bilateral foraminal stenosis, severe at T11-T12, L3-4, and L5-S1. 3. Severe bilateral facet arthropathy of L5-S1 with associated facet joint effusions, likely reactive. 4. Small amount of fluid within the L5-S1 disc space  is favored degenerative. No secondary bony or soft tissue changes to suggest discitis. Correlate with serum inflammatory markers if there is clinical suspicion for infection. Electronically Signed   By: Davina Poke D.O.   On: 04/23/2022 16:37    Review of Systems  Constitutional: Negative.   HENT: Negative.    Eyes: Negative.   Respiratory: Negative.    Cardiovascular:  Positive for leg swelling.  Gastrointestinal: Negative.   Blood pressure (!) 144/85, pulse 82, temperature 98.1 F (36.7 C), temperature source Oral, resp. rate 18, height '5\' 1"'$  (1.549 m), weight 111.1 kg, SpO2 99 %. Physical Exam Constitutional:      Appearance: She is obese.  HENT:     Head: Normocephalic and atraumatic.     Nose: Nose normal.     Mouth/Throat:     Mouth: Mucous membranes are moist.     Pharynx: Oropharynx is clear.  Eyes:     Extraocular Movements: Extraocular movements intact.     Conjunctiva/sclera: Conjunctivae normal.     Pupils: Pupils are equal, round, and reactive to light.  Pulmonary:     Effort: Pulmonary effort is normal.  Abdominal:     Palpations: Abdomen is soft.  Musculoskeletal:      Cervical back: Normal range of motion.     Right lower leg: Edema present.     Left lower leg: Edema present.  Skin:    General: Skin is warm and dry.  Neurological:     Mental Status: She is alert and oriented to person, place, and time.     Cranial Nerves: Cranial nerves 2-12 are intact.     Sensory: Sensory deficit present.     Motor: Weakness present. No abnormal muscle tone.     Deep Tendon Reflexes: Babinski sign absent on the right side. Babinski sign absent on the left side.     Comments: 4/5 left hip adductors, abductors, plantar flexors, dorsi flexors, quads and hams Essentially normal on the right side Proprioception severely impaired at the great toe on the left Normal in right lower extremity, bilateral upper extremities Temperature sense impaired left lower extremity  Psychiatric:        Mood and Affect: Mood normal.        Behavior: Behavior normal.        Thought Content: Thought content normal.        Judgment: Judgment normal.    Assessment/Plan: Will repeat the MRI to get axial images which were not performed on yesterday's scan. Anticipate operative decompression but await MRI findings I have explained this to Mrs. Johann  Lillyanne Mitchell 04/24/2022, 11:31 AM

## 2022-04-24 NOTE — Progress Notes (Signed)
Admitted via stretcher with [facility] to room 23 on 5west floor. Awake alert/oriented x 4

## 2022-04-24 NOTE — Plan of Care (Signed)

## 2022-04-24 NOTE — Progress Notes (Addendum)
PROGRESS NOTE                                                                                                                                                                                                             Patient Demographics:    Shelley Mitchell, is a 81 y.o. female, DOB - 07-12-1941, WLN:[id number]  Outpatient Primary MD for the patient is [name], MD    LOS - 1  Admit date - 04/23/2022    Chief Complaint  Patient presents with   Extremity Weakness       Brief Narrative (HPI from H&P)   81 year old female with a history of hypertension, hyperlipidemia, anxiety, chronic back pain with some chronic lower extremity weakness who presented to [facility] with acute on chronic worsening of her left lower extremity weakness that began in the early morning 04/23/2022.  MRI of her L-spine showed changes suggestive of canal stenosis at T11-T12 level.  She was subsequently sent to [facility] for neurosurgical evaluation.   Subjective:    Shelley Mitchell today has, No headache, No chest pain, No abdominal pain - No Nausea, ongoing lower extremity weakness left more than right for several weeks, no back pain at rest.   Assessment  & Plan :    Chronic low back pain with acute on chronic lower extremity weakness left more than right.  She currently does not have signs of cauda equina, good sphincter control, symptoms are acute on chronic, at rest her back pain is stable, CT scan of the brain is unremarkable.  MRI of the L-spine noted.  Case discussed with Dr. Christella Noa who will see the patient shortly.  Continue supportive care, PT - OT.  We will also check CRP and ESR along with procalcitonin levels.  If stable we may use some steroids for anti-inflammatory effect and pain relief.    Anxiety - Continue Zoloft  Hyperlipidemia - Continue statin  Essential hypertension - Continue nifedipine, hydralazine   Trace  edema.  Low-dose lasix x1 along with TED stockings.        Condition - Fair  Family Communication  :  Son [name] in detail over the phone on 04/24/2022, explained that patient will be moderate risk for adverse cardiopulmonary outcome if she goes for surgical correction.  He accepts and understands the risk.  Code  Status :  Full  Consults  :  N. Surgery  PUD Prophylaxis :    Procedures  :     CT head - Non acute  MRI L Spine -  1. 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 2. Multilevel bilateral foraminal stenosis, severe at T11-T12, L3-4, and L5-S1. 3. Severe bilateral facet arthropathy of L5-S1 with associated facet joint effusions, likely reactive. 4. Small amount of fluid within the L5-S1 disc space is favored degenerative. No secondary bony or soft tissue changes to suggest discitis. Correlate with serum inflammatory markers if there is clinical suspicion for infection.      Disposition Plan  :    Status is: Inpatient  DVT Prophylaxis  :    enoxaparin (LOVENOX) injection 40 mg Start: 04/23/22 2230   Lab Results  Component Value Date   PLT 217 04/23/2022    Diet :  Diet Order             Diet Heart Room service appropriate? Yes; Fluid consistency: Thin  Diet effective now                    Inpatient Medications  Scheduled Meds:  enoxaparin (LOVENOX) injection  40 mg Subcutaneous Q24H   hydrALAZINE  50 mg Oral TID   latanoprost  1 drop Both Eyes QHS   multivitamin with minerals  1 tablet Oral Daily   NIFEdipine  90 mg Oral Daily   pravastatin  40 mg Oral Daily   Continuous Infusions: PRN Meds:.acetaminophen **OR** acetaminophen, diclofenac Sodium, ondansetron **OR** ondansetron (ZOFRAN) IV  Antibiotics  :    Anti-infectives (From  admission, onward)    None        Time Spent in minutes  30   Lala Lund M.D on 04/24/2022 at 11:48 AM  To page go to [email or web address]   [facility] -  Office  [phone]  See all Orders from today for further details    Objective:   Vitals:   04/24/22 0403 04/24/22 0500 04/24/22 0600 04/24/22 0856  BP:    (!) 144/85  Pulse:   61 82  Resp: $Remo'18 16 18 18  'tExNA$ Temp:    98.1 F (36.7 C)  TempSrc:    Oral  SpO2:  100% 96% 99%  Weight:      Height:        Wt Readings from Last 3 Encounters:  04/23/22 111.1 kg  03/08/22 109.3 kg  07/04/21 108 kg     Intake/Output Summary (Last 24 hours) at 04/24/2022 1148 Last data filed at 04/24/2022 0104 Gross per 24 hour  Intake --  Output 650 ml  Net -650 ml     Physical Exam  Awake Alert, No new F.N deficits, mild weakness in both lower extremities left more than right  [place].AT,PERRAL Supple Neck, No JVD,   Symmetrical Chest wall movement, Good air movement bilaterally, CTAB RRR,No Gallops,Rubs or new Murmurs,  +ve B.Sounds, Abd Soft, No tenderness,   No Cyanosis, Clubbing or edema       Data Review:    CBC Recent Labs  Lab 04/23/22 1157  WBC 6.0  HGB 10.9*  HCT 34.0*  PLT 217  MCV 87.9  MCH 28.2  MCHC 32.1  RDW 15.2  LYMPHSABS 1.1  MONOABS 0.7  EOSABS 0.1  BASOSABS 0.0  Electrolytes Recent Labs  Lab 04/23/22 1157 04/23/22 2238 04/24/22 0631  NA 138  --   --   K 3.9  --   --   CL 106  --   --   CO2 25  --   --   GLUCOSE 107*  --   --   BUN 19  --   --   CREATININE 0.67  --   --   CALCIUM 9.5  --   --   MG  --  2.3  --   INR  --   --  1.0    ------------------------------------------------------------------------------------------------------------------ No results for input(s): CHOL, HDL, LDLCALC, TRIG, CHOLHDL, LDLDIRECT in the last 72 hours.  No results found for: HGBA1C  No results for input(s): TSH, T4TOTAL, T3FREE, THYROIDAB in the last 72 hours.  Invalid input(s):  FREET3 ------------------------------------------------------------------------------------------------------------------ ID Labs Recent Labs  Lab 04/23/22 1157  WBC 6.0  PLT 217  CREATININE 0.67   Cardiac Enzymes No results for input(s): CKMB, TROPONINI, MYOGLOBIN in the last 168 hours.  Invalid input(s): CK  Micro Results No results found for this or any previous visit (from the past 240 hour(s)).  Radiology Reports CT Head Wo Contrast  Result Date: 04/23/2022 CLINICAL DATA:  Weakness, fall. EXAM: CT HEAD WITHOUT CONTRAST TECHNIQUE: Contiguous axial images were obtained from the base of the skull through the vertex without intravenous contrast. RADIATION DOSE REDUCTION: This exam was performed according to the departmental dose-optimization program which includes automated exposure control, adjustment of the mA and/or kV according to patient size and/or use of iterative reconstruction technique. COMPARISON:  July 04, 2021. FINDINGS: Brain: Mild chronic ischemic white matter disease is noted. No mass effect or midline shift is noted. Ventricular size is within normal limits. There is no evidence of mass lesion, hemorrhage or acute infarction. Vascular: No hyperdense vessel or unexpected calcification. Skull: Normal. Negative for fracture or focal lesion. Sinuses/Orbits: No acute finding. Other: None. IMPRESSION: No acute intracranial abnormality seen. Electronically Signed   By: Marijo Conception M.D.   On: 04/23/2022 12:32   CT Lumbar Spine Wo Contrast  Result Date: 04/23/2022 CLINICAL DATA:  Fall, lower back pain EXAM: CT LUMBAR SPINE WITHOUT CONTRAST TECHNIQUE: Multidetector CT imaging of the lumbar spine was performed without intravenous contrast administration. Multiplanar CT image reconstructions were also generated. RADIATION DOSE REDUCTION: This exam was performed according to the departmental dose-optimization program which includes automated exposure control, adjustment of the mA  and/or kV according to patient size and/or use of iterative reconstruction technique. COMPARISON:  None Available. FINDINGS: Segmentation: Standard; the lowest formed disc space is designated L5-S1. Alignment: Normal. There is no antero or retrolisthesis. There is no jumped or perched facets or other evidence of traumatic malalignment. Vertebrae: Vertebral body heights are preserved. There is no acute fracture. There is no suspicious osseous lesion. The bones are diffusely demineralized. Paraspinal and other soft tissues: There is extensive calcified atherosclerotic plaque throughout the nonaneurysmal abdominal aorta. There is thickening of the bilateral adrenal glands without discrete nodule. Are postsurgical changes in the posterior soft tissues from L2 through L5. Disc levels: There is advanced multilevel disc space narrowing throughout the lumbar spine with multilevel vacuum disc phenomenon, most notable at L5-S1. There is advanced multilevel facet arthropathy, most severe in the lower lumbar spine. There is multilevel degenerative endplate change with bulky anterior osteophytes. T12-L1: Is a disc bulge, degenerative endplate change, and bilateral facet arthropathy resulting in mild-to-moderate spinal canal stenosis and moderate right and mild  left neural foraminal stenosis L1-L2: There is a disc bulge, degenerative endplate change, and bilateral facet arthropathy resulting in mild-to-moderate spinal canal stenosis and moderate bilateral neural foraminal stenosis L2-L3: Status post posterior decompression. There is a disc bulge, degenerative endplate change, and bilateral facet arthropathy resulting in mild-to-moderate bilateral neural foraminal stenosis without significant spinal canal stenosis L3-L4: Status post posterior decompression. There is a disc bulge, degenerative endplate change, and bilateral facet arthropathy resulting in moderate left and mild right neural foraminal stenosis without significant  spinal canal stenosis L4-L5: Status post posterior decompression. There is a disc bulge, degenerative endplate change, and severe bilateral facet arthropathy resulting in moderate to severe right and mild-to-moderate left neural foraminal stenosis without significant spinal canal stenosis L5-S1: Status post posterior decompression. There is a disc bulge, degenerative endplate change, and severe bilateral facet arthropathy resulting in severe bilateral neural foraminal stenosis without significant spinal canal stenosis. IMPRESSION: 1. No acute fracture or traumatic malalignment of the lumbar spine. 2. Postsurgical changes reflecting posterior decompression from L2-L3 through L5-S1. There is no definite high-grade spinal canal stenosis, but there is multilevel advanced multilevel degenerative change with bilateral neural foraminal stenosis, most advanced at L5-S1. 3.  Aortic Atherosclerosis (ICD10-I70.0). Electronically Signed   By: Valetta Mole M.D.   On: 04/23/2022 12:44   MR BRAIN WO CONTRAST  Result Date: 04/23/2022 CLINICAL DATA:  Neuro deficit, acute, stroke suspected; left leg weak EXAM: MRI HEAD WITHOUT CONTRAST TECHNIQUE: Multiplanar, multiecho pulse sequences of the brain and surrounding structures were obtained without intravenous contrast. COMPARISON:  None Available. FINDINGS: Brain: There is no acute infarction or intracranial hemorrhage. There is no intracranial mass, mass effect, or edema. There is no hydrocephalus or extra-axial fluid collection. Prominence of the ventricles and sulci reflects generalized parenchymal volume loss. Patchy and confluent areas of T2 hyperintensity in the supratentorial and pontine white matter are nonspecific may reflect moderate chronic microvascular ischemic changes. Vascular: Major vessel flow voids at the skull base are preserved. Skull and upper cervical spine: Normal marrow signal is preserved. Sinuses/Orbits: Paranasal sinuses are aerated. Orbits are  unremarkable. Other: Expanded, "empty" sella.  Mastoid air cells are clear. IMPRESSION: No acute infarction, hemorrhage, or mass. Moderate chronic microvascular ischemic changes. Electronically Signed   By: Macy Mis M.D.   On: 04/23/2022 16:33   MR LUMBAR SPINE WO CONTRAST  Result Date: 04/23/2022 CLINICAL DATA:  Low back pain, prior surgery, new symptoms left leg weakness EXAM: MRI LUMBAR SPINE WITHOUT CONTRAST TECHNIQUE: Multiplanar, multisequence MR imaging of the lumbar spine was performed. No intravenous contrast was administered. COMPARISON:  CT 04/23/2022 FINDINGS: Segmentation:  Standard. Alignment:  Grade 1 anterolisthesis of L4 on L5. Vertebrae: No acute fracture. No suspicious bone lesion. Bilateral facet joint effusions at L5-S1. Small amount of fluid within the L5-S1 disc space without associated endplate marrow edema. Vacuum disc phenomenon is present at this level. No paravertebral inflammatory changes or other evidence to suggest discitis. Conus medullaris and cauda equina: Conus extends to the L1 level. Increased T2/STIR signal within the lower thoracic cord at the T11-12 level. Conus and cauda equina appear normal. Paraspinal and other soft tissues: Posterior paraspinal muscle atrophy. Postsurgical changes present at the midline of the lumbar spine. Thin postoperative seroma along the incision site superficially. Disc levels: T11-T12: Sagittal sequences only. Diffuse disc bulge and posterior element hypertrophy resulting in severe canal stenosis with severe bilateral foraminal stenosis. T12-L1: Disc bulge and endplate spurring. Bilateral facet arthropathy and ligamentum flavum buckling. Moderate canal stenosis with  severe right and mild left foraminal stenosis. L1-L2: Mild disc bulge and endplate spurring. Mild bilateral facet arthropathy. Mild canal stenosis with moderate bilateral foraminal stenosis, right worse than left. L2-L3: Prior posterior decompression. Disc bulge and endplate  spurring. Bilateral facet arthropathy. No canal stenosis. Mild bilateral foraminal stenosis. L3-L4: Prior posterior decompression. Disc bulge and endplate spurring. Bilateral facet arthropathy. No canal stenosis. Severe left and mild right foraminal stenosis. L4-L5: Prior posterior decompression. Diffuse endplate spurring and moderate bilateral facet arthropathy. No canal stenosis. Mild-moderate right foraminal stenosis. L5-S1: Prior posterior decompression. Diffuse endplate spurring with severe bilateral facet arthropathy. Severe bilateral subarticular recess stenosis without canal stenosis. Severe bilateral foraminal stenosis. IMPRESSION: 1. 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 2. Multilevel bilateral foraminal stenosis, severe at T11-T12, L3-4, and L5-S1. 3. Severe bilateral facet arthropathy of L5-S1 with associated facet joint effusions, likely reactive. 4. Small amount of fluid within the L5-S1 disc space is favored degenerative. No secondary bony or soft tissue changes to suggest discitis. Correlate with serum inflammatory markers if there is clinical suspicion for infection. Electronically Signed   By: Davina Poke D.O.   On: 04/23/2022 16:37

## 2022-04-24 NOTE — Progress Notes (Signed)
Security came to bedside and took the three rings from the patient that she wanted locked up in the hospital safe. Receipt for valuables placed in pocket of pt chart.

## 2022-04-24 NOTE — Progress Notes (Signed)
Shelley Mitchell is a 81 y.o. female patient. TED stockings provided to patient. MRI ordered and pending at this time. Patient requests Ativan for claustrophobia. Medication provided at this time. This RN has contacted MRI twice this shift for an estimated time of transfer to MRI. Pending MRI chair availability.   1. Weakness of left lower extremity    Past Medical History:  Diagnosis Date   Endometrial polyp    Dr. Elonda Husky   History of endometrial hyperplasia    HTN (hypertension)    Hx of adenomatous colonic polyps 1999   Hyperlipidemia    Mental disorder    GAD   Obesity    Osteoarthritis    Tachycardia    subsequently with bradycardia (?secondary to meds)   Current Facility-Administered Medications  Medication Dose Route Frequency Provider Last Rate Last Admin   acetaminophen (TYLENOL) tablet 650 mg  650 mg Oral Q6H PRN Tat, Shanon Brow, MD   650 mg at 04/24/22 0623   Or   acetaminophen (TYLENOL) suppository 650 mg  650 mg Rectal Q6H PRN Tat, Shanon Brow, MD       diclofenac Sodium (VOLTAREN) 1 % topical gel 2 g  2 g Topical QID PRN Tat, Shanon Brow, MD       enoxaparin (LOVENOX) injection 40 mg  40 mg Subcutaneous Q24H Tat, Shanon Brow, MD   40 mg at 04/23/22 2347   hydrALAZINE (APRESOLINE) tablet 50 mg  50 mg Oral TID Orson Eva, MD   50 mg at 04/24/22 0940   latanoprost (XALATAN) 0.005 % ophthalmic solution 1 drop  1 drop Both Eyes QHS Tat, Shanon Brow, MD   1 drop at 04/23/22 2348   multivitamin with minerals tablet 1 tablet  1 tablet Oral Daily Tat, David, MD   1 tablet at 04/24/22 0940   NIFEdipine (PROCARDIA-XL/NIFEDICAL-XL) 24 hr tablet 90 mg  90 mg Oral Daily Tat, Shanon Brow, MD   90 mg at 04/24/22 0941   ondansetron (ZOFRAN) tablet 4 mg  4 mg Oral Q6H PRN Tat, Shanon Brow, MD       Or   ondansetron (ZOFRAN) injection 4 mg  4 mg Intravenous Q6H PRN Tat, Shanon Brow, MD       pravastatin (PRAVACHOL) tablet 40 mg  40 mg Oral Daily Tat, David, MD   40 mg at 04/24/22 0940   Allergies  Allergen Reactions   Ace Inhibitors  Itching   Nsaids Itching   Principal Problem:   Leg weakness Active Problems:   Essential hypertension   Hyperlipidemia   Anxiety   Spinal stenosis  Blood pressure (!) 144/85, pulse 82, temperature 98.1 F (36.7 C), temperature source Oral, resp. rate 18, height '5\' 1"'$  (1.549 m), weight 111.1 kg, SpO2 99 %.  Subjective Objective: Vital signs: (most recent): Blood pressure (!) 144/85, pulse 82, temperature 98.1 F (36.7 C), temperature source Oral, resp. rate 18, height '5\' 1"'$  (1.549 m), weight 111.1 kg, SpO2 99 %.   Assessment & Plan  Shelley Mitchell A Shelley Mitchell 04/24/2022

## 2022-04-24 NOTE — Evaluation (Signed)
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 PMH includes hyperlipidemia, anxiety, obesity, OA, and tachycardia.   Clinical Impression   Pt reports receiving assistance at baseline with ADLs/IADLs, and uses upright RW for mobility. Pt lives alone and has an aide that comes 3x/week for 2 hrs to assist with bathing, dressing, and IADLs; another aide that comes 1x/week to take pt to grocery store. Pt reports on days her aide is not there, she independently completes ADLs, however reports it takes a significant amount of time. Pt currently mod-max A+2 for ADLs, max A for bed mobility, and total A for sit to stand transfer attempt, will need +2 assist. Pt reports L sided neck pain with cervical rotation and some dizziness during session, BP WNL, RN notified pt requesting pain meds. Pt presenting with impairments listed below, will follow acutely. Recommend SNF at d/c.      Recommendations for follow up therapy are one component of a multi-disciplinary discharge planning process, led by the attending physician.  Recommendations may be updated based on patient status, additional functional criteria and insurance authorization.   Follow Up Recommendations  Skilled nursing-short term rehab (<3 hours/day)    Assistance Recommended at Discharge Intermittent Supervision/Assistance  Patient can return home with the following Two people to help with walking and/or transfers;A lot of help with bathing/dressing/bathroom;Assistance with cooking/housework;Direct supervision/assist for medications management;Direct supervision/assist for financial management;Help with stairs or ramp for entrance;Assist for transportation    Functional Status  Assessment  Patient has had a recent decline in their functional status and demonstrates the ability to make significant improvements in function in a reasonable and predictable amount of time.  Equipment Recommendations  None recommended by OT;Other (comment) (pt has all needed DME)    Recommendations for Other Services PT consult     Precautions / Restrictions Precautions Precautions: Fall Restrictions Weight Bearing Restrictions: No      Mobility Bed Mobility Overal bed mobility: Needs Assistance Bed Mobility: Sit to Supine, Supine to Sit     Supine to sit: Max assist Sit to supine: Max assist   General bed mobility comments: + use of bedrail    Transfers Overall transfer level: Needs assistance Equipment used: Rolling walker (2 wheels) Transfers: Sit to/from Stand Sit to Stand: Total assist           General transfer comment: cues to push from bed      Balance Overall balance assessment: Needs assistance Sitting-balance support: Feet supported Sitting balance-Leahy Scale: Fair Sitting balance - Comments: requires BUE support on bedrail Postural control: Posterior lean Standing balance support: During functional activity, Reliant on assistive device for balance, Bilateral upper extremity supported Standing balance-Leahy Scale: Zero Standing balance comment: unable to perform                           ADL either performed or assessed with clinical judgement   ADL Overall ADL's : Needs assistance/impaired Eating/Feeding: Set up;Sitting   Grooming: Minimal assistance;Sitting   Upper Body Bathing: Moderate assistance;Sitting   Lower Body Bathing: Maximal assistance;Sitting/lateral leans   Upper Body Dressing : Moderate assistance;Sitting   Lower Body Dressing: Maximal assistance;Bed level Lower Body Dressing Details (indicate cue type and reason):  to don socks Toilet Transfer: Maximal assistance;Total assistance;+2 for physical  assistance;Squat-pivot;BSC/3in1;Rolling walker (2 wheels)   Toileting- Clothing Manipulation and Hygiene: Maximal assistance;Sitting/lateral lean       Functional mobility during ADLs: Maximal assistance;Total assistance;+2 for physical assistance;Rolling walker (2 wheels)       Vision   Vision Assessment?: No apparent visual deficits     Perception     Praxis      Pertinent Vitals/Pain Pain Assessment Pain Assessment: Faces Pain Score: 5  Faces Pain Scale: Hurts little more Pain Location: L side of neck with rotation Pain Descriptors / Indicators: Discomfort, Grimacing, Guarding Pain Intervention(s): Limited activity within patient's tolerance, Monitored during session, Patient requesting pain meds-RN notified     Hand Dominance     Extremity/Trunk Assessment Upper Extremity Assessment Upper Extremity Assessment: Generalized weakness   Lower Extremity Assessment Lower Extremity Assessment: Defer to PT evaluation       Communication Communication Communication: No difficulties   Cognition Arousal/Alertness: Awake/alert Behavior During Therapy: WFL for tasks assessed/performed, Flat affect Overall Cognitive Status: Within Functional Limits for tasks assessed                                       General Comments  family in room and supportive during session, VSS on RA    Exercises     Shoulder Instructions      Home Living Family/patient expects to be discharged to:: Private residence Living Arrangements: Alone Available Help at Discharge: Personal care attendant;Available PRN/intermittently Type of Home: House       Home Layout: One level     Bathroom Shower/Tub: Occupational psychologist: Standard Bathroom Accessibility: No (upright walker cannot fit into pt's bathroom, has been furniture walking in bathroom)   Home Equipment: BSC/3in1;Tub bench;Rolling Walker (2 wheels);Other (comment) (upright walker)   Additional  Comments: Pt has aide that comes 3x/week for 2 hrs per day, assits with bathing/dressing/and household chores, has a different aide that comes 1x/week to take her grocery shopping      Prior Functioning/Environment Prior Level of Function : Needs assist       Physical Assist : ADLs (physical)   ADLs (physical): Bathing;Dressing Mobility Comments: uses upright RW ADLs Comments: reports when aide is not there she does these independently however it takes significantly longer        OT Problem List: Decreased strength;Decreased range of motion;Decreased activity tolerance;Impaired balance (sitting and/or standing);Decreased safety awareness;Decreased knowledge of use of DME or AE      OT Treatment/Interventions: Self-care/ADL training;Therapeutic exercise;DME and/or AE instruction;Therapeutic activities;Patient/family education;Balance training    OT Goals(Current goals can be found in the care plan section) Acute Rehab OT Goals Patient Stated Goal: none stated OT Goal Formulation: With patient Time For Goal Achievement: 05/08/22 Potential to Achieve Goals: Good ADL Goals Pt Will Perform Upper Body Dressing: with min guard assist;sitting Pt Will Perform Lower Body Dressing: with mod assist;sit to/from stand;sitting/lateral leans Pt Will Transfer to Toilet: with mod assist;with +2 assist;bedside commode;stand pivot transfer;squat pivot transfer  OT Frequency: Min 2X/week    Co-evaluation              AM-PAC OT "6 Clicks" Daily Activity     Outcome Measure Help from another person eating meals?: None Help from another person taking care of personal grooming?: A Little Help from another person toileting, which includes using toliet, bedpan, or  urinal?: A Lot Help from another person bathing (including washing, rinsing, drying)?: A Lot Help from another person to put on and taking off regular upper body clothing?: A Lot Help from another person to put on and taking off regular  lower body clothing?: Total 6 Click Score: 14   End of Session Equipment Utilized During Treatment: Gait belt;Rolling walker (2 wheels) Nurse Communication: Mobility status;Patient requests pain meds  Activity Tolerance: Patient tolerated treatment well Patient left: in bed;with call bell/phone within reach;with bed alarm set;with family/visitor present  OT Visit Diagnosis: Unsteadiness on feet (R26.81);Other abnormalities of gait and mobility (R26.89);Muscle weakness (generalized) (M62.81)                Time: 1500-1536 OT Time Calculation (min): 36 min Charges:  OT General Charges $OT Visit: 1 Visit OT Evaluation $OT Eval Moderate Complexity: 1 Mod OT Treatments $Self Care/Home Management : 8-22 mins  Lynnda Child, OTD, OTR/L Acute Rehab [phone] - [place] 04/24/2022, 4:35 PM

## 2022-04-24 NOTE — Progress Notes (Signed)
PT Cancellation Note  Patient Details Name: Shelley Mitchell MRN: [id number] DOB: 02/05/1941   Cancelled Treatment:    Reason Eval/Treat Not Completed: Other (comment).  Neurosurgeon is determining if surgical intervention is needed, will retry when these decisions are made.   Ramond Dial 04/24/2022, 12:37 PM  Mee Hives, PT PhD Acute Rehab Dept. Number: [place] [facility] [place]

## 2022-04-25 ENCOUNTER — Inpatient Hospital Stay (HOSPITAL_COMMUNITY): Payer: Medicare PPO

## 2022-04-25 DIAGNOSIS — E783 Hyperchylomicronemia: Secondary | ICD-10-CM | POA: Diagnosis not present

## 2022-04-25 LAB — CBC WITH DIFFERENTIAL/PLATELET
Abs Immature Granulocytes: 0.02 10*3/uL (ref 0.00–0.07)
Basophils Absolute: 0 10*3/uL (ref 0.0–0.1)
Basophils Relative: 0 %
Eosinophils Absolute: 0 10*3/uL (ref 0.0–0.5)
Eosinophils Relative: 0 %
HCT: 34.3 % — ABNORMAL LOW (ref 36.0–46.0)
Hemoglobin: 11.2 g/dL — ABNORMAL LOW (ref 12.0–15.0)
Immature Granulocytes: 0 %
Lymphocytes Relative: 17 %
Lymphs Abs: 1.4 10*3/uL (ref 0.7–4.0)
MCH: 28.5 pg (ref 26.0–34.0)
MCHC: 32.7 g/dL (ref 30.0–36.0)
MCV: 87.3 fL (ref 80.0–100.0)
Monocytes Absolute: 1.1 10*3/uL — ABNORMAL HIGH (ref 0.1–1.0)
Monocytes Relative: 13 %
Neutro Abs: 6.1 10*3/uL (ref 1.7–7.7)
Neutrophils Relative %: 70 %
Platelets: 215 10*3/uL (ref 150–400)
RBC: 3.93 MIL/uL (ref 3.87–5.11)
RDW: 14.8 % (ref 11.5–15.5)
WBC: 8.7 10*3/uL (ref 4.0–10.5)
nRBC: 0 % (ref 0.0–0.2)

## 2022-04-25 LAB — COMPREHENSIVE METABOLIC PANEL
ALT: 12 U/L (ref 0–44)
AST: 12 U/L — ABNORMAL LOW (ref 15–41)
Albumin: 3.5 g/dL (ref 3.5–5.0)
Alkaline Phosphatase: 52 U/L (ref 38–126)
Anion gap: 8 (ref 5–15)
BUN: 19 mg/dL (ref 8–23)
CO2: 24 mmol/L (ref 22–32)
Calcium: 9.5 mg/dL (ref 8.9–10.3)
Chloride: 104 mmol/L (ref 98–111)
Creatinine, Ser: 0.76 mg/dL (ref 0.44–1.00)
GFR, Estimated: >60 mL/min (ref >60)
Glucose, Bld: 109 mg/dL — ABNORMAL HIGH (ref 70–99)
Potassium: 4 mmol/L (ref 3.5–5.1)
Sodium: 136 mmol/L (ref 135–145)
Total Bilirubin: 0.5 mg/dL (ref 0.3–1.2)
Total Protein: 6.7 g/dL (ref 6.5–8.1)

## 2022-04-25 LAB — PROCALCITONIN: Procalcitonin: <0.1 ng/mL

## 2022-04-25 LAB — MAGNESIUM: Magnesium: 2 mg/dL (ref 1.7–2.4)

## 2022-04-25 IMAGING — MR MR THORACIC SPINE W/O CM
4 of 7 series · 22 of 48 positions shown · non-contrast
Comparison: Lumbar MRI [DATE].
COMPARISON: Lumbar MRI [DATE].

Addendum:
CLINICAL DATA: 81-year-old female with probable lower thoracic
spinal cord stenosis and cord signal abnormality on lumbar MRI 2
days ago.

EXAM:
MRI THORACIC SPINE WITHOUT CONTRAST
TECHNIQUE: Multiplanar, multisequence MR imaging of the thoracic spine was
performed. No intravenous contrast was administered.

[Series 12: T1 · sagittal · 5.0mm · 1.23mm/px · 3 of 9 slices shown (1 of 2)]
[im 1/9]
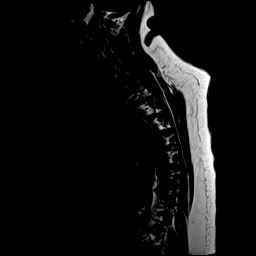
[im 5/9]
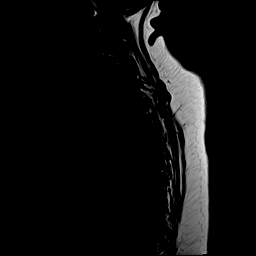
[im 9/9]
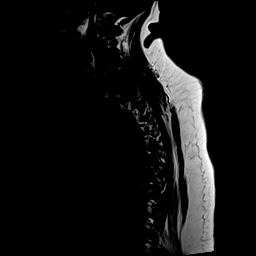

[Series 13: T1 · sagittal · 3.0mm · 0.71mm/px · 4 of 17 slices shown (2 of 2)]
[im 1/17]
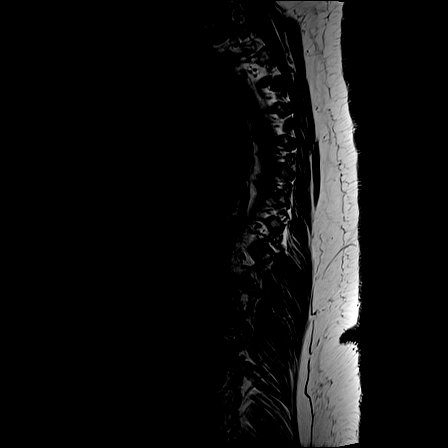
[im 6/17]
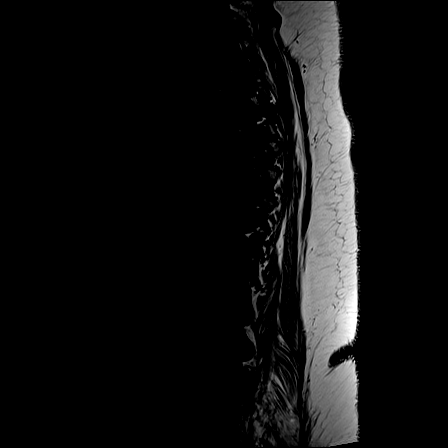
[im 11/17]
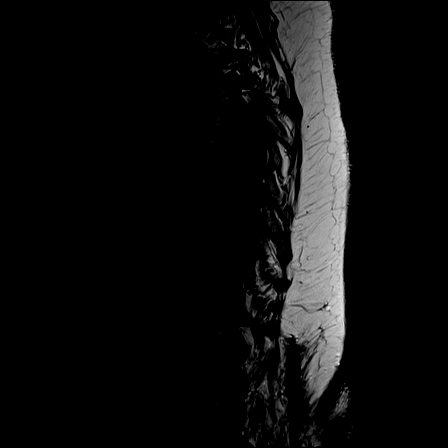
[im 17/17]
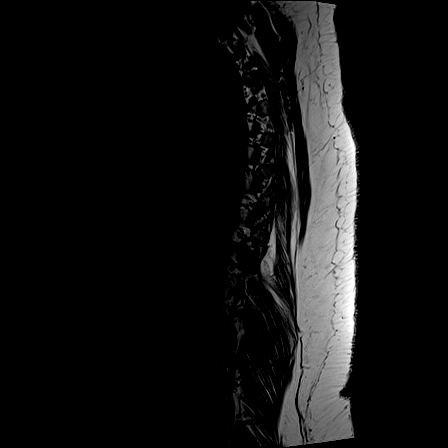

[Series 14: T2 · sagittal · 3.0mm · 0.71mm/px · 4 of 17 slices shown (1 of 2)]
[im 1/17]
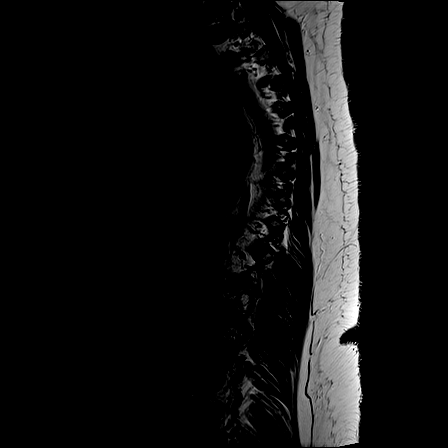
[im 6/17]
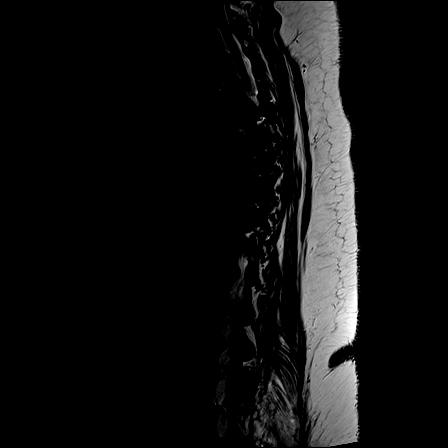
[im 11/17]
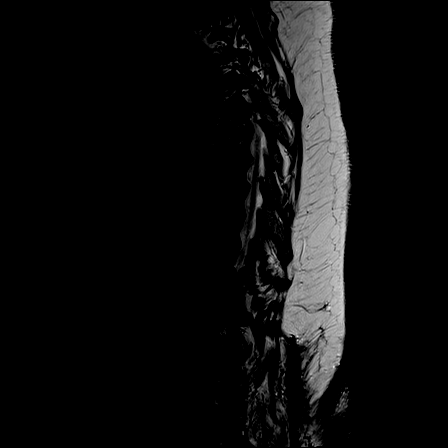
[im 17/17]
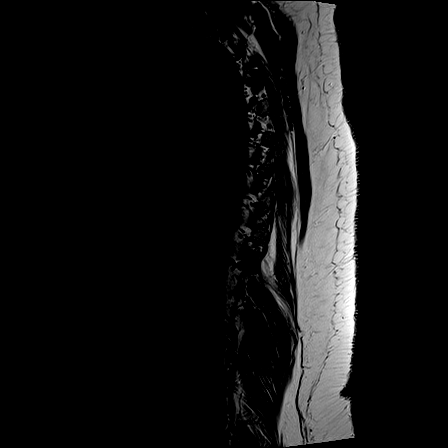

[Series 17: T2 · axial · 4.0mm · 0.59mm/px · z∈[-213,-16]mm · 11 of 41 slices shown (2 of 2)]
[im 1/41]
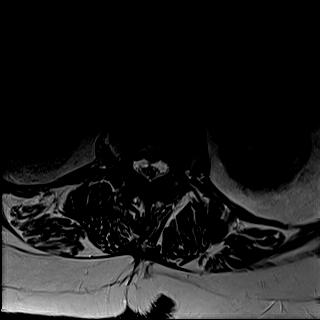
[im 5/41]
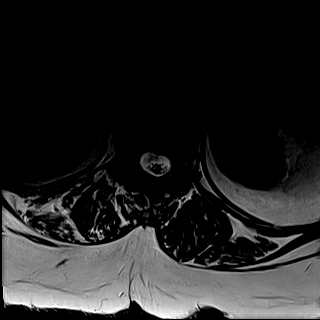
[im 9/41]
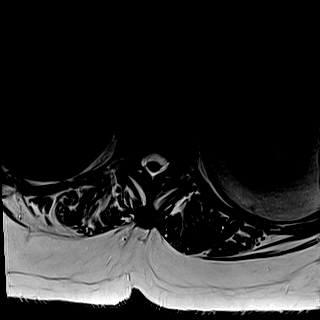
[im 13/41]
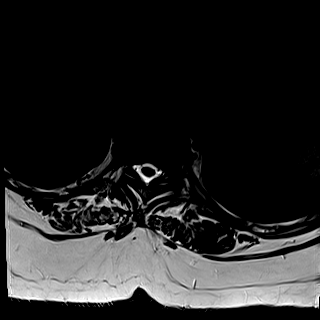
[im 17/41]
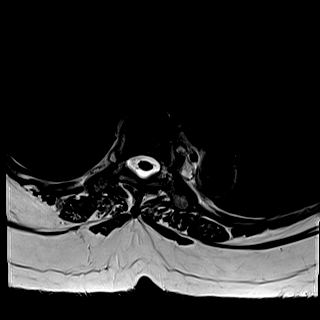
[im 21/41]
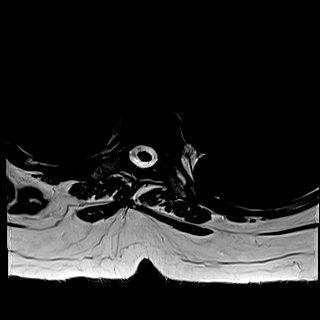
[im 25/41]
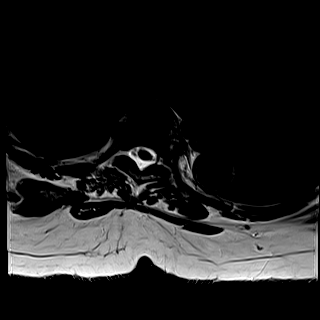
[im 29/41]
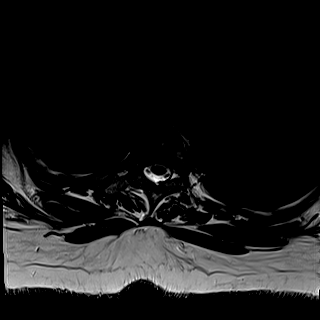
[im 33/41]
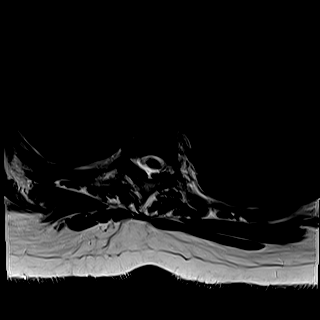
[im 37/41]
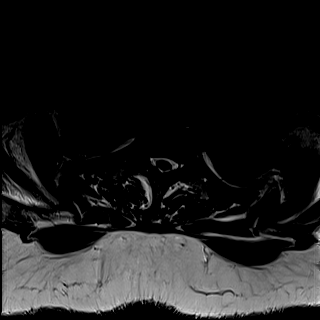
[im 41/41]
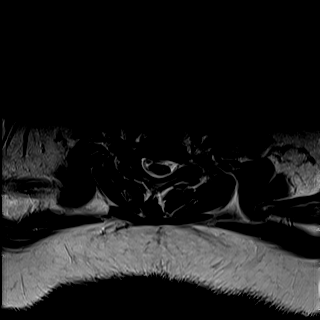

[22 of 48 positions shown; findings below may reference images not displayed]

FINDINGS: Limited cervical spine imaging: Widespread cervical spine
degeneration with evidence of some spondylolisthesis at C5. Advanced
cervical disc space loss, endplate spurring. But only mild cervical
spinal stenosis suspected.

Thoracic spine segmentation:  Appears to be normal.

Alignment: Mild dextroconvex thoracic scoliosis. Relatively
preserved thoracic kyphosis. Subtle degenerative appearing
anterolisthesis at both T1-T2 and T2-T3.

Vertebrae: Underlying evidence of T3-T4 interbody ankylosis.
Widespread chronic degenerative thoracic endplate marrow signal
changes. Background bone marrow signal within normal limits. No
convincing marrow edema or acute osseous abnormality.

Cord: Thoracic spinal cord signal and morphology remain normal above
T11. But there is degenerative cord compression with abnormal cord
signal confirmed at T11-T12 (series 17, image 36). Conus medullaris
below that appears stable from the recent lumbar MRI, terminating
just below T12-L1.

Paraspinal and other soft tissues: Negative visible chest and upper
abdominal viscera aside from probable tortuosity of the thoracic
aorta. Thoracic paraspinal soft tissues remain within normal limits.

Disc levels:

T1-T2: Mild anterolisthesis with disc bulging. Up to moderate facet
and ligament flavum hypertrophy. No spinal stenosis. Moderate to
severe bilateral T1 foraminal stenosis.

T2-T3: Similar anterolisthesis. Disc space loss and disc bulging
eccentric to the right. Moderate facet and ligament flavum
hypertrophy. Borderline to mild spinal stenosis (series 17, image
8), no convincing cord mass effect. Moderate right T2 foraminal
stenosis.

T3-T4: Interbody ankylosis. Posterior element hypertrophy. No spinal
stenosis. Moderate bilateral T3 foraminal stenosis.

T4-T5: Circumferential disc bulge with endplate spurring and
moderate to severe facet and ligament flavum hypertrophy. No spinal
stenosis. Moderate to severe left greater than right T4 foraminal
stenosis.

T5-T6: Advanced disc space loss. Bulky circumferential disc
osteophyte complex. Moderate facet and ligament flavum hypertrophy.
But no significant spinal stenosis. Moderate to severe left and mild
right T5 foraminal stenosis.

T6-T7: Disc space loss with bulky circumferential disc osteophyte
complex eccentric to the left. Moderate facet and ligament flavum
hypertrophy. No spinal stenosis. Severe left T6 foraminal stenosis.

T7-T8: Similar disc space loss. Bulky circumferential disc
osteophyte complex. Severe facet and ligament flavum hypertrophy.
Degenerative facet joint fluid. Mild spinal stenosis (series 17,
image 23). No cord mass effect. Severe left and mild to moderate
right T7 foraminal stenosis.

T8-T9: Circumferential disc osteophyte complex eccentric to the
left. Moderate to severe facet and ligament flavum hypertrophy.
Borderline spinal stenosis. Severe left and mild to moderate right
T8 foraminal stenosis.

T9-T10: Disc space loss with circumferential disc osteophyte
complex. Mild to moderate facet and ligament flavum hypertrophy. No
spinal stenosis. Severe left and mild-to-moderate right T9 foraminal
stenosis.

T10-T11: Circumferential disc osteophyte complex with mild to
moderate facet and ligament flavum hypertrophy. No significant
spinal stenosis. Moderate to severe bilateral T10 foraminal
stenosis.

T11-T12: Disc space loss with bulky circumferential disc osteophyte
complex and severe facet and ligament flavum hypertrophy.
Degenerative facet joint fluid. Moderate to severe spinal stenosis
with cord mass effect. Abnormal cord signal here more resembles
myelomalacia than spinal cord edema (just below the disc space level
series 17, image 37). Severe bilateral T11 foraminal stenosis.

T12-L1: Disc space loss with bulky circumferential disc osteophyte
complex. Moderate facet and ligament flavum hypertrophy. Mild spinal
stenosis with up to mild mass effect here at the conus. No conus
signal abnormality identified. Mild to moderate left and moderate to
severe right T12 foraminal stenosis.
IMPRESSION: 1. Confirmed high-grade multifactorial spinal stenosis at T11-T12
with Spinal Cord Compression and Abnormal Spinal Cord Signal, which
more resembles myelomalacia than spinal cord edema. Associated
severe bilateral T11 foraminal stenosis.

2. Widespread advanced thoracic spine degeneration elsewhere.
Additional mild spinal stenosis at T2-T3, T7-T8, and T12-L1. Up to
mild mass effect on the conus at the latter, but no additional
thoracic cord signal abnormality identified.

3. Widespread moderate or severe degenerative neural foraminal
stenosis at multiple other thoracic levels.

4. Underlying interbody ankylosis at T3-T4. No acute osseous
abnormality.

ADDENDUM:
Study discussed by telephone with the patient's nurse JIM on
[DATE] at [ZM] hours.

*** End of Addendum ***
FINDINGS: Limited cervical spine imaging: Widespread cervical spine
degeneration with evidence of some spondylolisthesis at C5. Advanced
cervical disc space loss, endplate spurring. But only mild cervical
spinal stenosis suspected.

Thoracic spine segmentation:  Appears to be normal.

Alignment: Mild dextroconvex thoracic scoliosis. Relatively
preserved thoracic kyphosis. Subtle degenerative appearing
anterolisthesis at both T1-T2 and T2-T3.

Vertebrae: Underlying evidence of T3-T4 interbody ankylosis.
Widespread chronic degenerative thoracic endplate marrow signal
changes. Background bone marrow signal within normal limits. No
convincing marrow edema or acute osseous abnormality.

Cord: Thoracic spinal cord signal and morphology remain normal above
T11. But there is degenerative cord compression with abnormal cord
signal confirmed at T11-T12 (series 17, image 36). Conus medullaris
below that appears stable from the recent lumbar MRI, terminating
just below T12-L1.

Paraspinal and other soft tissues: Negative visible chest and upper
abdominal viscera aside from probable tortuosity of the thoracic
aorta. Thoracic paraspinal soft tissues remain within normal limits.

Disc levels:

T1-T2: Mild anterolisthesis with disc bulging. Up to moderate facet
and ligament flavum hypertrophy. No spinal stenosis. Moderate to
severe bilateral T1 foraminal stenosis.

T2-T3: Similar anterolisthesis. Disc space loss and disc bulging
eccentric to the right. Moderate facet and ligament flavum
hypertrophy. Borderline to mild spinal stenosis (series 17, image
8), no convincing cord mass effect. Moderate right T2 foraminal
stenosis.

T3-T4: Interbody ankylosis. Posterior element hypertrophy. No spinal
stenosis. Moderate bilateral T3 foraminal stenosis.

T4-T5: Circumferential disc bulge with endplate spurring and
moderate to severe facet and ligament flavum hypertrophy. No spinal
stenosis. Moderate to severe left greater than right T4 foraminal
stenosis.

T5-T6: Advanced disc space loss. Bulky circumferential disc
osteophyte complex. Moderate facet and ligament flavum hypertrophy.
But no significant spinal stenosis. Moderate to severe left and mild
right T5 foraminal stenosis.

T6-T7: Disc space loss with bulky circumferential disc osteophyte
complex eccentric to the left. Moderate facet and ligament flavum
hypertrophy. No spinal stenosis. Severe left T6 foraminal stenosis.

T7-T8: Similar disc space loss. Bulky circumferential disc
osteophyte complex. Severe facet and ligament flavum hypertrophy.
Degenerative facet joint fluid. Mild spinal stenosis (series 17,
image 23). No cord mass effect. Severe left and mild to moderate
right T7 foraminal stenosis.

T8-T9: Circumferential disc osteophyte complex eccentric to the
left. Moderate to severe facet and ligament flavum hypertrophy.
Borderline spinal stenosis. Severe left and mild to moderate right
T8 foraminal stenosis.

T9-T10: Disc space loss with circumferential disc osteophyte
complex. Mild to moderate facet and ligament flavum hypertrophy. No
spinal stenosis. Severe left and mild-to-moderate right T9 foraminal
stenosis.

T10-T11: Circumferential disc osteophyte complex with mild to
moderate facet and ligament flavum hypertrophy. No significant
spinal stenosis. Moderate to severe bilateral T10 foraminal
stenosis.

T11-T12: Disc space loss with bulky circumferential disc osteophyte
complex and severe facet and ligament flavum hypertrophy.
Degenerative facet joint fluid. Moderate to severe spinal stenosis
with cord mass effect. Abnormal cord signal here more resembles
myelomalacia than spinal cord edema (just below the disc space level
series 17, image 37). Severe bilateral T11 foraminal stenosis.

T12-L1: Disc space loss with bulky circumferential disc osteophyte
complex. Moderate facet and ligament flavum hypertrophy. Mild spinal
stenosis with up to mild mass effect here at the conus. No conus
signal abnormality identified. Mild to moderate left and moderate to
severe right T12 foraminal stenosis.
IMPRESSION: 1. Confirmed high-grade multifactorial spinal stenosis at T11-T12
with Spinal Cord Compression and Abnormal Spinal Cord Signal, which
more resembles myelomalacia than spinal cord edema. Associated
severe bilateral T11 foraminal stenosis.

2. Widespread advanced thoracic spine degeneration elsewhere.
Additional mild spinal stenosis at T2-T3, T7-T8, and T12-L1. Up to
mild mass effect on the conus at the latter, but no additional
thoracic cord signal abnormality identified.

3. Widespread moderate or severe degenerative neural foraminal
stenosis at multiple other thoracic levels.

4. Underlying interbody ankylosis at T3-T4. No acute osseous
abnormality.

## 2022-04-25 MED ORDER — LORAZEPAM 2 MG/ML IJ SOLN
1.0000 mg | Freq: Once | INTRAMUSCULAR | Status: AC | PRN
  Administered 2022-04-25: 1 mg via INTRAVENOUS
  Filled 2022-04-25: qty 1

## 2022-04-25 NOTE — Evaluation (Signed)
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 PMH includes hyperlipidemia, anxiety, obesity, OA, and tachycardia.  Clinical Impression  Pt admitted secondary to problem above with deficits below. Pt requiring max A for bed mobility tasks and up to mod A for sitting balance. Pt with posterior lean in sitting so worked on anterior weightshifting. Unsafe to attempt further transfers with +1 assist. Reviewed seated and supine HEP with pt. Recommending SNF level therapies at d/c. Will continue to follow acutely.      Recommendations for follow up therapy are one component of a multi-disciplinary discharge planning process, led by the attending physician.  Recommendations may be updated based on patient status, additional functional criteria and insurance authorization.  Follow Up Recommendations Skilled nursing-short term rehab (<3 hours/day)    Assistance Recommended at Discharge Frequent or constant Supervision/Assistance  Patient can return home with the following  Two people to help with walking and/or transfers;Two people to help with bathing/dressing/bathroom;Assistance with cooking/housework;Help with stairs or ramp for entrance;Assist for transportation    Equipment Recommendations None recommended by PT  Recommendations for Other Services       Functional Status Assessment Patient has had a recent decline in their functional status and demonstrates the ability to make significant improvements in function in a reasonable and predictable amount of time.     Precautions / Restrictions Precautions Precautions: Fall Restrictions Weight Bearing  Restrictions: No      Mobility  Bed Mobility Overal bed mobility: Needs Assistance Bed Mobility: Sit to Supine, Supine to Sit     Supine to sit: Max assist Sit to supine: Max assist   General bed mobility comments: Max A for trunk and LE assist to come to sitting. Pt with posterior lean and required cues to shift anteriorly    Transfers                   General transfer comment: Unable to safely perform with +1 assist    Ambulation/Gait                  Stairs            Wheelchair Mobility    Modified Rankin (Stroke Patients Only)       Balance Overall balance assessment: Needs assistance Sitting-balance support: Feet supported Sitting balance-Leahy Scale: Fair Sitting balance - Comments: requires BUE support and up to mod A Postural control: Posterior lean                                   Pertinent Vitals/Pain Pain Assessment Pain Assessment: Faces Faces Pain Scale: No hurt    Home Living Family/patient expects to be discharged to:: Private residence Living Arrangements: Alone Available Help at Discharge: Personal care attendant;Available PRN/intermittently Type of Home: House         Home Layout: One level Home Equipment: BSC/3in1;Tub bench;Rolling Walker (2 wheels);Other (comment) (upright walker) Additional Comments: Pt has aide that comes 3x/week for 2 hrs per day, assits with bathing/dressing/and household chores, has a different aide that comes 1x/week to take her grocery shopping    Prior Function  Prior Level of Function : Needs assist             Mobility Comments: uses upright RW ADLs Comments: reports when aide is not there she does these independently however it takes significantly longer     Hand Dominance        Extremity/Trunk Assessment   Upper Extremity Assessment Upper Extremity Assessment: Defer to OT evaluation    Lower Extremity Assessment Lower Extremity Assessment: RLE  deficits/detail;LLE deficits/detail RLE Deficits / Details: Grossly 2/5 with knee extension and hip flexion and ankle DF. LLE Deficits / Details: Reports decreased sensation in feet. Grossly 1/5 with ankle DF and 3/5 with knee extension       Communication   Communication: No difficulties  Cognition Arousal/Alertness: Awake/alert Behavior During Therapy: WFL for tasks assessed/performed, Flat affect Overall Cognitive Status: Within Functional Limits for tasks assessed                                          General Comments      Exercises General Exercises - Lower Extremity Ankle Circles/Pumps: AROM, Right, AAROM, Left, 10 reps, Supine Quad Sets: AROM, Both, 10 reps, Supine Long Arc Quad: AROM, Left, AAROM, Right, 10 reps, Seated   Assessment/Plan    PT Assessment Patient needs continued PT services  PT Problem List Decreased strength;Decreased activity tolerance;Decreased balance;Decreased mobility;Decreased knowledge of use of DME;Decreased knowledge of precautions       PT Treatment Interventions DME instruction;Gait training;Functional mobility training;Therapeutic exercise;Therapeutic activities;Balance training;Patient/family education    PT Goals (Current goals can be found in the Care Plan section)  Acute Rehab PT Goals Patient Stated Goal: to get stronger PT Goal Formulation: With patient/family Time For Goal Achievement: 05/09/22 Potential to Achieve Goals: Good    Frequency Min 2X/week     Co-evaluation               AM-PAC PT "6 Clicks" Mobility  Outcome Measure Help needed turning from your back to your side while in a flat bed without using bedrails?: A Lot Help needed moving from lying on your back to sitting on the side of a flat bed without using bedrails?: A Lot Help needed moving to and from a bed to a chair (including a wheelchair)?: Total Help needed standing up from a chair using your arms (e.g., wheelchair or bedside  chair)?: Total Help needed to walk in hospital room?: Total Help needed climbing 3-5 steps with a railing? : Total 6 Click Score: 8    End of Session   Activity Tolerance: Patient tolerated treatment well Patient left: in bed;with call bell/phone within reach;with bed alarm set;with family/visitor present Nurse Communication: Mobility status PT Visit Diagnosis: Unsteadiness on feet (R26.81);Muscle weakness (generalized) (M62.81);Difficulty in walking, not elsewhere classified (R26.2)    Time: 5462-7035 PT Time Calculation (min) (ACUTE ONLY): 17 min   Charges:   PT Evaluation $PT Eval Moderate Complexity: 1 Mod          Reuel Derby, PT, DPT  Acute Rehabilitation Services  Pager: [phone] Office: [phone]   Rudean Hitt 04/25/2022, 2:40 PM

## 2022-04-25 NOTE — Progress Notes (Signed)
PROGRESS NOTE                                                                                                                                                                                                             Patient Demographics:    Shelley Mitchell, is a 81 y.o. female, DOB - [birth year], JQZ:[id number]  Outpatient Primary MD for the patient is Shelley Brock, MD    LOS - 2  Admit date - 04/23/2022    Chief Complaint  Patient presents with   Extremity Weakness       Brief Narrative (HPI from H&P)   81 year old female with a history of hypertension, hyperlipidemia, anxiety, chronic back pain with some chronic lower extremity weakness who presented to [facility] with acute on chronic worsening of her left lower extremity weakness that began in the early morning 04/23/2022.  MRI of her L-spine showed changes suggestive of canal stenosis at T11-T12 level.  She was subsequently sent to [facility] for neurosurgical evaluation.   Subjective:   Patient in bed, appears comfortable, denies any headache, no fever, no chest pain or pressure, no shortness of breath , no abdominal pain. No new focal weakness, continues to have some bilateral lower extremity weakness left more than right.  Dull low back pain.   Assessment  & Plan :    Chronic low back pain with acute on chronic lower extremity weakness left more than right.  She currently does not have signs of cauda equina, good sphincter control, symptoms are acute on chronic, at rest her back pain is stable, CT scan of the brain is unremarkable.  MRI of the L-spine noted.  Neurosurgeon Dr. Christella Noa following, MRI T-spine ordered and pending, continue supportive care with PT OT.  ESR mildly elevated but stable CRP, negative procalcitonin.  No fever or leukocytosis.   Anxiety - Continue Zoloft  Hyperlipidemia - Continue statin  Essential hypertension - Continue  nifedipine, hydralazine   Chronic trace edema.  Low-dose lasix x1 given on 04/24/2022, continue with TED stockings.        Condition - Fair  Family Communication  :  Son Shelley Mitchell in detail over the phone on 04/24/2022, explained that patient will be moderate risk for adverse cardiopulmonary outcome if she goes for surgical correction.  He accepts and understands the risk.  Sons  bedside 04/25/22   Code Status :  Full  Consults  :  N. Surgery  PUD Prophylaxis :    Procedures  :     CT head - Non acute  MRI L Spine -  1. 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 2. Multilevel bilateral foraminal stenosis, severe at T11-T12, L3-4, and L5-S1. 3. Severe bilateral facet arthropathy of L5-S1 with associated facet joint effusions, likely reactive. 4. Small amount of fluid within the L5-S1 disc space is favored degenerative. No secondary bony or soft tissue changes to suggest discitis. Correlate with serum inflammatory markers if there is clinical suspicion for infection.      Disposition Plan  :    Status is: Inpatient  DVT Prophylaxis  :    Place TED hose Start: 04/24/22 1223 enoxaparin (LOVENOX) injection 40 mg Start: 04/23/22 2230   Lab Results  Component Value Date   PLT 215 04/25/2022    Diet :  Diet Order             Diet Heart Room service appropriate? Yes; Fluid consistency: Thin  Diet effective now                    Inpatient Medications  Scheduled Meds:  enoxaparin (LOVENOX) injection  40 mg Subcutaneous Q24H   hydrALAZINE  50 mg Oral TID   latanoprost  1 drop Both Eyes QHS   multivitamin with minerals  1 tablet Oral Daily   NIFEdipine  90 mg Oral Daily   pravastatin  40 mg Oral Daily   Continuous Infusions: PRN Meds:.acetaminophen **OR**  acetaminophen, diclofenac Sodium, ondansetron **OR** ondansetron (ZOFRAN) IV  Antibiotics  :    Anti-infectives (From admission, onward)    None        Time Spent in minutes  30   Lala Lund M.D on 04/25/2022 at 9:17 AM  To page go to [email or web address]   [facility] -  Office  [phone]  See all Orders from today for further details    Objective:   Vitals:   04/24/22 1621 04/24/22 2000 04/25/22 0000 04/25/22 0400  BP: 137/86 (!) 142/65 134/61 136/66  Pulse: (!) 58 74 76 70  Resp: (!) _0 (!) 22  Temp: 98.5 F (36.9 C)  99 F (37.2 C) 98.4 F (36.9 C)  TempSrc: Oral  Oral Oral  SpO2: 97% 98% 98% 94%  Weight:      Height:        Wt Readings from Last 3 Encounters:  04/23/22 111.1 kg  03/08/22 109.3 kg  07/04/21 108 kg     Intake/Output Summary (Last 24 hours) at 04/25/2022 0917 Last data filed at 04/25/2022 0010 Gross per 24 hour  Intake --  Output 950 ml  Net -950 ml     Physical Exam  Awake Alert, No new F.N deficits, mild weakness in both lower extremities left more than right  [place].AT,PERRAL Supple Neck, No JVD,   Symmetrical Chest wall movement, Good air movement bilaterally, CTAB RRR,No Gallops, Rubs or new Murmurs,  +ve B.Sounds, Abd Soft, No tenderness,   No Cyanosis, trace edema        Data Review:    CBC Recent Labs  Lab 04/23/22 1157 04/25/22 0133  WBC 6.0 8.7  HGB 10.9* 11.2*  HCT 34.0* 34.3*  PLT 217 215  MCV 87.9 87.3  MCH 28.2 28.5  MCHC 32.1 32.7  RDW 15.2 14.8  LYMPHSABS 1.1 1.4  MONOABS 0.7 1.1*  EOSABS 0.1 0.0  BASOSABS 0.0 0.0    Electrolytes Recent Labs  Lab 04/23/22 1157 04/23/22 2238 04/24/22 0631 04/24/22 1217 04/25/22 0133  NA 138  --   --   --  136  K 3.9  --   --   --  4.0  CL 106  --   --   --  104  CO2 25  --   --   --  24  GLUCOSE 107*  --   --   --  109*  BUN 19  --   --   --  19  CREATININE 0.67  --   --   --  0.76  CALCIUM 9.5  --   --   --  9.5  AST  --   --   --   --   12*  ALT  --   --   --   --  12  ALKPHOS  --   --   --   --  52  BILITOT  --   --   --   --  0.5  ALBUMIN  --   --   --   --  3.5  MG  --  2.3  --   --  2.0  CRP  --   --   --  0.6  --   PROCALCITON  --   --   --  <0.10 <0.10  INR  --   --  1.0  --   --   HGBA1C 5.6  --   --   --   --     ------------------------------------------------------------------------------------------------------------------ No results for input(s): CHOL, HDL, LDLCALC, TRIG, CHOLHDL, LDLDIRECT in the last 72 hours.  Lab Results  Component Value Date   HGBA1C 5.6 04/23/2022    No results for input(s): TSH, T4TOTAL, T3FREE, THYROIDAB in the last 72 hours.  Invalid input(s): FREET3 ------------------------------------------------------------------------------------------------------------------ ID Labs Recent Labs  Lab 04/23/22 1157 04/24/22 1217 04/25/22 0133  WBC 6.0  --  8.7  PLT 217  --  215  CRP  --  0.6  --   PROCALCITON  --  <0.10 <0.10  CREATININE 0.67  --  0.76   Cardiac Enzymes No results for input(s): CKMB, TROPONINI, MYOGLOBIN in the last 168 hours.  Invalid input(s): CK  Micro Results No results found for this or any previous visit (from the past 240 hour(s)).  Radiology Reports CT Head Wo Contrast  Result Date: 04/23/2022 CLINICAL DATA:  Weakness, fall. EXAM: CT HEAD WITHOUT CONTRAST TECHNIQUE: Contiguous axial images were obtained from the base of the skull through the vertex without intravenous contrast. RADIATION DOSE REDUCTION: This exam was performed according to the departmental dose-optimization program which includes automated exposure control, adjustment of the mA and/or kV according to patient size and/or use of iterative reconstruction technique. COMPARISON:  July 04, 2021. FINDINGS: Brain: Mild chronic ischemic white matter disease is noted. No mass effect or midline shift is noted. Ventricular size is within normal limits. There is no evidence of mass lesion, hemorrhage  or acute infarction. Vascular: No hyperdense vessel or unexpected calcification. Skull: Normal. Negative for fracture or focal lesion. Sinuses/Orbits: No acute finding. Other: None. IMPRESSION: No acute intracranial abnormality seen. Electronically Signed   By: Marijo Conception M.D.   On: 04/23/2022 12:32  CT Lumbar Spine Wo Contrast  Result Date: 04/23/2022 CLINICAL DATA:  Fall, lower back pain EXAM: CT LUMBAR SPINE WITHOUT CONTRAST TECHNIQUE: Multidetector CT imaging of the lumbar spine was performed without intravenous contrast administration. Multiplanar CT image reconstructions were also generated. RADIATION DOSE REDUCTION: This exam was performed according to the departmental dose-optimization program which includes automated exposure control, adjustment of the mA and/or kV according to patient size and/or use of iterative reconstruction technique. COMPARISON:  None Available. FINDINGS: Segmentation: Standard; the lowest formed disc space is designated L5-S1. Alignment: Normal. There is no antero or retrolisthesis. There is no jumped or perched facets or other evidence of traumatic malalignment. Vertebrae: Vertebral body heights are preserved. There is no acute fracture. There is no suspicious osseous lesion. The bones are diffusely demineralized. Paraspinal and other soft tissues: There is extensive calcified atherosclerotic plaque throughout the nonaneurysmal abdominal aorta. There is thickening of the bilateral adrenal glands without discrete nodule. Are postsurgical changes in the posterior soft tissues from L2 through L5. Disc levels: There is advanced multilevel disc space narrowing throughout the lumbar spine with multilevel vacuum disc phenomenon, most notable at L5-S1. There is advanced multilevel facet arthropathy, most severe in the lower lumbar spine. There is multilevel degenerative endplate change with bulky anterior osteophytes. T12-L1: Is a disc bulge, degenerative endplate change, and  bilateral facet arthropathy resulting in mild-to-moderate spinal canal stenosis and moderate right and mild left neural foraminal stenosis L1-L2: There is a disc bulge, degenerative endplate change, and bilateral facet arthropathy resulting in mild-to-moderate spinal canal stenosis and moderate bilateral neural foraminal stenosis L2-L3: Status post posterior decompression. There is a disc bulge, degenerative endplate change, and bilateral facet arthropathy resulting in mild-to-moderate bilateral neural foraminal stenosis without significant spinal canal stenosis L3-L4: Status post posterior decompression. There is a disc bulge, degenerative endplate change, and bilateral facet arthropathy resulting in moderate left and mild right neural foraminal stenosis without significant spinal canal stenosis L4-L5: Status post posterior decompression. There is a disc bulge, degenerative endplate change, and severe bilateral facet arthropathy resulting in moderate to severe right and mild-to-moderate left neural foraminal stenosis without significant spinal canal stenosis L5-S1: Status post posterior decompression. There is a disc bulge, degenerative endplate change, and severe bilateral facet arthropathy resulting in severe bilateral neural foraminal stenosis without significant spinal canal stenosis. IMPRESSION: 1. No acute fracture or traumatic malalignment of the lumbar spine. 2. Postsurgical changes reflecting posterior decompression from L2-L3 through L5-S1. There is no definite high-grade spinal canal stenosis, but there is multilevel advanced multilevel degenerative change with bilateral neural foraminal stenosis, most advanced at L5-S1. 3.  Aortic Atherosclerosis (ICD10-I70.0). Electronically Signed   By: Valetta Mole M.D.   On: 04/23/2022 12:44   MR BRAIN WO CONTRAST  Result Date: 04/23/2022 CLINICAL DATA:  Neuro deficit, acute, stroke suspected; left leg weak EXAM: MRI HEAD WITHOUT CONTRAST TECHNIQUE: Multiplanar,  multiecho pulse sequences of the brain and surrounding structures were obtained without intravenous contrast. COMPARISON:  None Available. FINDINGS: Brain: There is no acute infarction or intracranial hemorrhage. There is no intracranial mass, mass effect, or edema. There is no hydrocephalus or extra-axial fluid collection. Prominence of the ventricles and sulci reflects generalized parenchymal volume loss. Patchy and confluent areas of T2 hyperintensity in the supratentorial and pontine white matter are nonspecific may reflect moderate chronic microvascular ischemic changes. Vascular: Major vessel flow voids at the skull base are preserved. Skull and upper cervical spine: Normal marrow signal is preserved. Sinuses/Orbits: Paranasal sinuses are aerated. Orbits are  unremarkable. Other: Expanded, "empty" sella.  Mastoid air cells are clear. IMPRESSION: No acute infarction, hemorrhage, or mass. Moderate chronic microvascular ischemic changes. Electronically Signed   By: Macy Mis M.D.   On: 04/23/2022 16:33   MR LUMBAR SPINE WO CONTRAST  Result Date: 04/23/2022 CLINICAL DATA:  Low back pain, prior surgery, new symptoms left leg weakness EXAM: MRI LUMBAR SPINE WITHOUT CONTRAST TECHNIQUE: Multiplanar, multisequence MR imaging of the lumbar spine was performed. No intravenous contrast was administered. COMPARISON:  CT 04/23/2022 FINDINGS: Segmentation:  Standard. Alignment:  Grade 1 anterolisthesis of L4 on L5. Vertebrae: No acute fracture. No suspicious bone lesion. Bilateral facet joint effusions at L5-S1. Small amount of fluid within the L5-S1 disc space without associated endplate marrow edema. Vacuum disc phenomenon is present at this level. No paravertebral inflammatory changes or other evidence to suggest discitis. Conus medullaris and cauda equina: Conus extends to the L1 level. Increased T2/STIR signal within the lower thoracic cord at the T11-12 level. Conus and cauda equina appear normal. Paraspinal  and other soft tissues: Posterior paraspinal muscle atrophy. Postsurgical changes present at the midline of the lumbar spine. Thin postoperative seroma along the incision site superficially. Disc levels: T11-T12: Sagittal sequences only. Diffuse disc bulge and posterior element hypertrophy resulting in severe canal stenosis with severe bilateral foraminal stenosis. T12-L1: Disc bulge and endplate spurring. Bilateral facet arthropathy and ligamentum flavum buckling. Moderate canal stenosis with severe right and mild left foraminal stenosis. L1-L2: Mild disc bulge and endplate spurring. Mild bilateral facet arthropathy. Mild canal stenosis with moderate bilateral foraminal stenosis, right worse than left. L2-L3: Prior posterior decompression. Disc bulge and endplate spurring. Bilateral facet arthropathy. No canal stenosis. Mild bilateral foraminal stenosis. L3-L4: Prior posterior decompression. Disc bulge and endplate spurring. Bilateral facet arthropathy. No canal stenosis. Severe left and mild right foraminal stenosis. L4-L5: Prior posterior decompression. Diffuse endplate spurring and moderate bilateral facet arthropathy. No canal stenosis. Mild-moderate right foraminal stenosis. L5-S1: Prior posterior decompression. Diffuse endplate spurring with severe bilateral facet arthropathy. Severe bilateral subarticular recess stenosis without canal stenosis. Severe bilateral foraminal stenosis. IMPRESSION: 1. 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 2. Multilevel bilateral foraminal stenosis, severe at T11-T12, L3-4, and L5-S1. 3. Severe bilateral facet arthropathy of L5-S1 with associated facet joint effusions, likely reactive. 4. Small amount of fluid within the L5-S1 disc space is favored degenerative. No  secondary bony or soft tissue changes to suggest discitis. Correlate with serum inflammatory markers if there is clinical suspicion for infection. Electronically Signed   By: Davina Poke D.O.   On: 04/23/2022 16:37

## 2022-04-26 DIAGNOSIS — F419 Anxiety disorder, unspecified: Secondary | ICD-10-CM | POA: Diagnosis not present

## 2022-04-26 DIAGNOSIS — I10 Essential (primary) hypertension: Secondary | ICD-10-CM | POA: Diagnosis not present

## 2022-04-26 LAB — COMPREHENSIVE METABOLIC PANEL
ALT: 10 U/L (ref 0–44)
AST: 14 U/L — ABNORMAL LOW (ref 15–41)
Albumin: 3.2 g/dL — ABNORMAL LOW (ref 3.5–5.0)
Alkaline Phosphatase: 44 U/L (ref 38–126)
Anion gap: 9 (ref 5–15)
BUN: 19 mg/dL (ref 8–23)
CO2: 22 mmol/L (ref 22–32)
Calcium: 9.2 mg/dL (ref 8.9–10.3)
Chloride: 104 mmol/L (ref 98–111)
Creatinine, Ser: 0.69 mg/dL (ref 0.44–1.00)
GFR, Estimated: >60 mL/min (ref >60)
Glucose, Bld: 124 mg/dL — ABNORMAL HIGH (ref 70–99)
Potassium: 3.9 mmol/L (ref 3.5–5.1)
Sodium: 135 mmol/L (ref 135–145)
Total Bilirubin: 0.7 mg/dL (ref 0.3–1.2)
Total Protein: 6.3 g/dL — ABNORMAL LOW (ref 6.5–8.1)

## 2022-04-26 LAB — CBC WITH DIFFERENTIAL/PLATELET
Abs Immature Granulocytes: 0.04 10*3/uL (ref 0.00–0.07)
Basophils Absolute: 0 10*3/uL (ref 0.0–0.1)
Basophils Relative: 0 %
Eosinophils Absolute: 0 10*3/uL (ref 0.0–0.5)
Eosinophils Relative: 0 %
HCT: 33.6 % — ABNORMAL LOW (ref 36.0–46.0)
Hemoglobin: 11.1 g/dL — ABNORMAL LOW (ref 12.0–15.0)
Immature Granulocytes: 0 %
Lymphocytes Relative: 14 %
Lymphs Abs: 1.3 10*3/uL (ref 0.7–4.0)
MCH: 28.9 pg (ref 26.0–34.0)
MCHC: 33 g/dL (ref 30.0–36.0)
MCV: 87.5 fL (ref 80.0–100.0)
Monocytes Absolute: 0.9 10*3/uL (ref 0.1–1.0)
Monocytes Relative: 10 %
Neutro Abs: 7.1 10*3/uL (ref 1.7–7.7)
Neutrophils Relative %: 76 %
Platelets: 185 10*3/uL (ref 150–400)
RBC: 3.84 MIL/uL — ABNORMAL LOW (ref 3.87–5.11)
RDW: 14.8 % (ref 11.5–15.5)
WBC: 9.3 10*3/uL (ref 4.0–10.5)
nRBC: 0 % (ref 0.0–0.2)

## 2022-04-26 LAB — PROCALCITONIN: Procalcitonin: <0.1 ng/mL

## 2022-04-26 LAB — MAGNESIUM: Magnesium: 1.9 mg/dL (ref 1.7–2.4)

## 2022-04-26 MED ORDER — SERTRALINE HCL 100 MG PO TABS
100.0000 mg | ORAL_TABLET | Freq: Every day | ORAL | Status: DC
  Administered 2022-04-27 – 2022-04-30 (×4): 100 mg via ORAL
  Filled 2022-04-26 (×4): qty 1

## 2022-04-26 MED ORDER — HYDRALAZINE HCL 25 MG PO TABS
25.0000 mg | ORAL_TABLET | Freq: Three times a day (TID) | ORAL | Status: DC
  Administered 2022-04-26 – 2022-04-30 (×11): 25 mg via ORAL
  Filled 2022-04-26 (×12): qty 1

## 2022-04-26 MED ORDER — TRAMADOL HCL 50 MG PO TABS
50.0000 mg | ORAL_TABLET | Freq: Four times a day (QID) | ORAL | Status: DC | PRN
  Administered 2022-04-26 – 2022-04-30 (×4): 50 mg via ORAL
  Filled 2022-04-26 (×4): qty 1

## 2022-04-26 NOTE — [place] FL2 (Signed)
[place] LEVEL OF CARE SCREENING TOOL     IDENTIFICATION  Patient Name: Shelley Mitchell Birthdate: [birth year] Sex: female Admission Date (Current Location): 04/23/2022  [facility] and [place] Number:  Manufacturing engineer and Address:  [facility] [place], [place] [address], [place], [place] [address]      Provider Number: [id number]  Attending Physician Name and Address:  Thurnell Lose, MD  Relative Name and Phone Number:       Current Level of Care: Hospital Recommended Level of Care: [place] Prior Approval Number:    Date Approved/Denied:   PASRR Number: [id number]  Discharge Plan: SNF    Current Diagnoses: Patient Active Problem List   Diagnosis Date Noted   Leg weakness 04/23/2022   Essential hypertension 04/23/2022   Hyperlipidemia 04/23/2022   Anxiety 04/23/2022   Spinal stenosis 04/23/2022   Osteoarthritis    OSA (obstructive sleep apnea) 04/22/2021   Constipation 08/15/2019   Screening for colorectal cancer 01/29/2019   PMB (postmenopausal bleeding) 12/20/2017   History of endometrial hyperplasia 12/20/2017   Encounter for well woman exam with routine gynecological exam 12/20/2017   Hx of adenomatous colonic polyps 05/31/2011    Orientation RESPIRATION BLADDER Height & Weight     Self, Time, Situation, Place  Normal Incontinent, External catheter Weight: 245 lb (111.1 kg) Height:  '5\' 1"'$  (154.9 cm)  BEHAVIORAL SYMPTOMS/MOOD NEUROLOGICAL BOWEL NUTRITION STATUS      Continent Diet (See DC Summary)  AMBULATORY STATUS COMMUNICATION OF NEEDS Skin   Extensive Assist Verbally Normal                       Personal Care Assistance Level of Assistance  Bathing, Feeding, Dressing Bathing Assistance: Limited assistance Feeding assistance: Limited assistance Dressing Assistance: Limited assistance     Functional Limitations Info             SPECIAL CARE FACTORS FREQUENCY  PT (By licensed PT), OT (By  licensed OT)     PT Frequency: 5x/week OT Frequency: 5x/week            Contractures Contractures Info: Not present    Additional Factors Info  Code Status, Allergies, Psychotropic Code Status Info: Full Allergies Info: Ace Inhibitors, Nsaids Psychotropic Info: Zoloft         Current Medications (04/26/2022):  This is the current hospital active medication list Current Facility-Administered Medications  Medication Dose Route Frequency Provider Last Rate Last Admin   acetaminophen (TYLENOL) tablet 650 mg  650 mg Oral Q6H PRN Tat, Shanon Brow, MD   650 mg at 04/26/22 0400   Or   acetaminophen (TYLENOL) suppository 650 mg  650 mg Rectal Q6H PRN Tat, Shanon Brow, MD       diclofenac Sodium (VOLTAREN) 1 % topical gel 2 g  2 g Topical QID PRN Tat, Shanon Brow, MD       enoxaparin (LOVENOX) injection 40 mg  40 mg Subcutaneous Q24H Tat, David, MD   40 mg at 04/25/22 2130   hydrALAZINE (APRESOLINE) tablet 25 mg  25 mg Oral TID Thurnell Lose, MD       latanoprost (XALATAN) 0.005 % ophthalmic solution 1 drop  1 drop Both Eyes QHS Orson Eva, MD   1 drop at 04/25/22 2115   multivitamin with minerals tablet 1 tablet  1 tablet Oral Daily Tat, David, MD   1 tablet at 04/26/22 0830   NIFEdipine (PROCARDIA-XL/NIFEDICAL-XL) 24 hr tablet 90 mg  90 mg Oral Daily Tat, David, MD   90 mg at 04/26/22 0830   ondansetron (ZOFRAN) tablet 4 mg  4 mg Oral Q6H PRN Tat, David, MD       Or   ondansetron (ZOFRAN) injection 4 mg  4 mg Intravenous Q6H PRN Tat, Shanon Brow, MD       pravastatin (PRAVACHOL) tablet 40 mg  40 mg Oral Daily Tat, David, MD   40 mg at 04/26/22 0830   [START ON 04/27/2022] sertraline (ZOLOFT) tablet 100 mg  100 mg Oral Daily Thurnell Lose, MD       traMADol Veatrice Bourbon) tablet 50 mg  50 mg Oral Q6H PRN Thurnell Lose, MD   50 mg at 04/26/22 1239     Discharge Medications: Please see discharge summary for a list of discharge medications.  Relevant Imaging Results:  Relevant Lab  Results:   Additional Information SSN: [id number]. Moderna COVID-19 Vaccine 10/06/2021 , 01/18/2020 , 12/21/2019;  Moderna Covid-19 Booster Vaccine 11/11/2020.  Benard Halsted, LCSW

## 2022-04-26 NOTE — Progress Notes (Signed)
PROGRESS NOTE                                                                                                                                                                                                             Patient Demographics:    Shelley Mitchell, is a 81 y.o. female, DOB - 1941-05-25, JPE:[id number]  Outpatient Primary MD for the patient is [name], MD    LOS - 3  Admit date - 04/23/2022    Chief Complaint  Patient presents with   Extremity Weakness       Brief Narrative (HPI from H&P)   81 year old female with a history of hypertension, hyperlipidemia, anxiety, chronic back pain with some chronic lower extremity weakness who presented to [facility] with acute on chronic worsening of her left lower extremity weakness that began in the early morning 04/23/2022.  MRI of her L-spine showed changes suggestive of canal stenosis at T11-T12 level.  She was subsequently sent to [facility] for neurosurgical evaluation.   Subjective:   Patient in bed, appears comfortable, denies any headache, no fever, no chest pain or pressure, no shortness of breath , no abdominal pain. No new focal weakness, continues to have some bilateral lower extremity weakness left more than right.  Dull low back pain.   Assessment  & Plan :    Chronic low back pain with acute on chronic lower extremity weakness left more than right.  She currently does not have signs of cauda equina, good sphincter control, symptoms are acute on chronic, at rest her back pain is stable, CT scan of the brain is unremarkable.  MRI of the L-spine and follow-up T-spine noted.  Neurosurgeon Dr. Christella Noa following,  ESR mildly elevated but stable CRP, negative procalcitonin.  No fever or leukocytosis.  Per patient she was told by Dr. Christella Noa on 04/25/2022 that she may require surgery.  Will defer management of this issue to neurosurgery.  Anxiety -  Continue Zoloft  Hyperlipidemia - Continue statin  Essential hypertension - Continue nifedipine, hydralazine   Chronic trace edema.  Low-dose lasix x1 given on 04/24/2022, continue with TED stockings.        Condition - Fair  Family Communication  :  Son [name] in detail over the phone on 04/24/2022, explained that patient will be moderate risk for adverse cardiopulmonary  outcome if she goes for surgical correction.  He accepts and understands the risk.  Sons bedside 04/25/22   Code Status :  Full  Consults  :  N. Surgery  PUD Prophylaxis :    Procedures  :     MRI T Spine - 1. 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 Associated severe bilateral T11 foraminal stenosis. 2. Widespread advanced thoracic spine degeneration elsewhere. Additional mild spinal stenosis at T2-T3, T7-T8, and T12-L1. Up to mild mass effect on the conus at the latter, but no additional thoracic cord signal abnormality identified. 3. Widespread moderate or severe degenerative neural foraminal stenosis at multiple other thoracic levels. 4. 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 No acute osseous abnormality.  CT head - Non acute  MRI L Spine -  1. 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 2. Multilevel bilateral foraminal stenosis, severe at T11-T12, L3-4, and L5-S1. 3. Severe bilateral facet arthropathy of L5-S1 with associated facet joint effusions, likely reactive. 4. Small amount of fluid within the L5-S1 disc space is favored degenerative. No secondary bony or soft tissue changes to suggest discitis. Correlate with serum inflammatory markers if there is clinical  suspicion for infection.      Disposition Plan  :    Status is: Inpatient  DVT Prophylaxis  :    Place TED hose Start: 04/24/22 1223 enoxaparin (LOVENOX) injection 40 mg Start: 04/23/22 2230   Lab Results  Component Value Date   PLT 185 04/26/2022    Diet :  Diet Order             Diet Heart Room service appropriate? Yes with Assist; Fluid consistency: Thin  Diet effective now                    Inpatient Medications  Scheduled Meds:  enoxaparin (LOVENOX) injection  40 mg Subcutaneous Q24H   hydrALAZINE  50 mg Oral TID   latanoprost  1 drop Both Eyes QHS   multivitamin with minerals  1 tablet Oral Daily   NIFEdipine  90 mg Oral Daily   pravastatin  40 mg Oral Daily   [START ON 04/27/2022] sertraline  100 mg Oral Daily   Continuous Infusions: PRN Meds:.acetaminophen **OR** acetaminophen, diclofenac Sodium, ondansetron **OR** ondansetron (ZOFRAN) IV  Antibiotics  :    Anti-infectives (From admission, onward)    None        Time Spent in minutes  30   Lala Lund M.D on 04/26/2022 at 10:50 AM  To page go to [email or web address]   [facility] -  Office  [phone]  See all Orders from today for further details    Objective:   Vitals:   04/25/22 1933 04/25/22 2310 04/26/22 0345 04/26/22 0831  BP: (!) 152/71 (!) 145/62 (!) 147/79 105/87  Pulse: 77 76 75 70  Resp: $Remo'20 18 19 18  'paezB$ Temp: 98.3 F (36.8 C) 98.3 F (36.8 C) 98.2 F (36.8 C) 99.2 F (37.3 C)  TempSrc: Oral Oral Oral Oral  SpO2: 97% 98% 99% 98%  Weight:      Height:        Wt Readings from Last 3 Encounters:  04/23/22 111.1 kg  03/08/22 109.3 kg  07/04/21 108 kg  Intake/Output Summary (Last 24 hours) at 04/26/2022 1050 Last data filed at 04/26/2022 0214 Gross per 24 hour  Intake --  Output 900 ml  Net -900 ml     Physical Exam  Awake Alert, No new F.N deficits, mild weakness in both lower extremities left more than right  [place].AT,PERRAL Supple Neck, No JVD,    Symmetrical Chest wall movement, Good air movement bilaterally, CTAB RRR,No Gallops, Rubs or new Murmurs,  +ve B.Sounds, Abd Soft, No tenderness,   No Cyanosis, Clubbing or edema        Data Review:    CBC Recent Labs  Lab 04/23/22 1157 04/25/22 0133 04/26/22 0205  WBC 6.0 8.7 9.3  HGB 10.9* 11.2* 11.1*  HCT 34.0* 34.3* 33.6*  PLT 217 215 185  MCV 87.9 87.3 87.5  MCH 28.2 28.5 28.9  MCHC 32.1 32.7 33.0  RDW 15.2 14.8 14.8  LYMPHSABS 1.1 1.4 1.3  MONOABS 0.7 1.1* 0.9  EOSABS 0.1 0.0 0.0  BASOSABS 0.0 0.0 0.0    Electrolytes Recent Labs  Lab 04/23/22 1157 04/23/22 2238 04/24/22 0341 04/24/22 0631 04/24/22 1217 04/25/22 0133 04/26/22 0205  NA 138  --  135  --   --  136 135  K 3.9  --  3.8  --   --  4.0 3.9  CL 106  --  103  --   --  104 104  CO2 25  --  24  --   --  24 22  GLUCOSE 107*  --  111*  --   --  109* 124*  BUN 19  --  14  --   --  19 19  CREATININE 0.67  --  0.69  --   --  0.76 0.69  CALCIUM 9.5  --  9.3  --   --  9.5 9.2  AST  --   --   --   --   --  12* 14*  ALT  --   --   --   --   --  12 10  ALKPHOS  --   --   --   --   --  52 44  BILITOT  --   --   --   --   --  0.5 0.7  ALBUMIN  --   --   --   --   --  3.5 3.2*  MG  --  2.3  --   --   --  2.0 1.9  CRP  --   --   --   --  0.6  --   --   PROCALCITON  --   --   --   --  <0.10 <0.10 <0.10  INR  --   --   --  1.0  --   --   --   HGBA1C 5.6  --   --   --   --   --   --     ------------------------------------------------------------------------------------------------------------------ Recent Labs    04/24/22 0341  CHOL 160  HDL 67  LDLCALC 76  TRIG 83  CHOLHDL 2.4    Radiology Reports CT Head Wo Contrast  Result Date: 04/23/2022 CLINICAL DATA:  Weakness, fall. EXAM: CT HEAD WITHOUT CONTRAST TECHNIQUE: Contiguous axial images were obtained from the base of the skull through the vertex without intravenous contrast. RADIATION DOSE REDUCTION: This exam was performed according to the  departmental dose-optimization program which includes automated exposure control, adjustment of the mA and/or kV according  to patient size and/or use of iterative reconstruction technique. COMPARISON:  July 04, 2021. FINDINGS: Brain: Mild chronic ischemic white matter disease is noted. No mass effect or midline shift is noted. Ventricular size is within normal limits. There is no evidence of mass lesion, hemorrhage or acute infarction. Vascular: No hyperdense vessel or unexpected calcification. Skull: Normal. Negative for fracture or focal lesion. Sinuses/Orbits: No acute finding. Other: None. IMPRESSION: No acute intracranial abnormality seen. Electronically Signed   By: Marijo Conception M.D.   On: 04/23/2022 12:32   CT Lumbar Spine Wo Contrast  Result Date: 04/23/2022 CLINICAL DATA:  Fall, lower back pain EXAM: CT LUMBAR SPINE WITHOUT CONTRAST TECHNIQUE: Multidetector CT imaging of the lumbar spine was performed without intravenous contrast administration. Multiplanar CT image reconstructions were also generated. RADIATION DOSE REDUCTION: This exam was performed according to the departmental dose-optimization program which includes automated exposure control, adjustment of the mA and/or kV according to patient size and/or use of iterative reconstruction technique. COMPARISON:  None Available. FINDINGS: Segmentation: Standard; the lowest formed disc space is designated L5-S1. Alignment: Normal. There is no antero or retrolisthesis. There is no jumped or perched facets or other evidence of traumatic malalignment. Vertebrae: Vertebral body heights are preserved. There is no acute fracture. There is no suspicious osseous lesion. The bones are diffusely demineralized. Paraspinal and other soft tissues: There is extensive calcified atherosclerotic plaque throughout the nonaneurysmal abdominal aorta. There is thickening of the bilateral adrenal glands without discrete nodule. Are postsurgical changes in the posterior  soft tissues from L2 through L5. Disc levels: There is advanced multilevel disc space narrowing throughout the lumbar spine with multilevel vacuum disc phenomenon, most notable at L5-S1. There is advanced multilevel facet arthropathy, most severe in the lower lumbar spine. There is multilevel degenerative endplate change with bulky anterior osteophytes. T12-L1: Is a disc bulge, degenerative endplate change, and bilateral facet arthropathy resulting in mild-to-moderate spinal canal stenosis and moderate right and mild left neural foraminal stenosis L1-L2: There is a disc bulge, degenerative endplate change, and bilateral facet arthropathy resulting in mild-to-moderate spinal canal stenosis and moderate bilateral neural foraminal stenosis L2-L3: Status post posterior decompression. There is a disc bulge, degenerative endplate change, and bilateral facet arthropathy resulting in mild-to-moderate bilateral neural foraminal stenosis without significant spinal canal stenosis L3-L4: Status post posterior decompression. There is a disc bulge, degenerative endplate change, and bilateral facet arthropathy resulting in moderate left and mild right neural foraminal stenosis without significant spinal canal stenosis L4-L5: Status post posterior decompression. There is a disc bulge, degenerative endplate change, and severe bilateral facet arthropathy resulting in moderate to severe right and mild-to-moderate left neural foraminal stenosis without significant spinal canal stenosis L5-S1: Status post posterior decompression. There is a disc bulge, degenerative endplate change, and severe bilateral facet arthropathy resulting in severe bilateral neural foraminal stenosis without significant spinal canal stenosis. IMPRESSION: 1. No acute fracture or traumatic malalignment of the lumbar spine. 2. Postsurgical changes reflecting posterior decompression from L2-L3 through L5-S1. There is no definite high-grade spinal canal stenosis, but  there is multilevel advanced multilevel degenerative change with bilateral neural foraminal stenosis, most advanced at L5-S1. 3.  Aortic Atherosclerosis (ICD10-I70.0). Electronically Signed   By: Valetta Mole M.D.   On: 04/23/2022 12:44   MR BRAIN WO CONTRAST  Result Date: 04/23/2022 CLINICAL DATA:  Neuro deficit, acute, stroke suspected; left leg weak EXAM: MRI HEAD WITHOUT CONTRAST TECHNIQUE: Multiplanar, multiecho pulse sequences of the brain and surrounding structures were obtained without intravenous contrast.  COMPARISON:  None Available. FINDINGS: Brain: There is no acute infarction or intracranial hemorrhage. There is no intracranial mass, mass effect, or edema. There is no hydrocephalus or extra-axial fluid collection. Prominence of the ventricles and sulci reflects generalized parenchymal volume loss. Patchy and confluent areas of T2 hyperintensity in the supratentorial and pontine white matter are nonspecific may reflect moderate chronic microvascular ischemic changes. Vascular: Major vessel flow voids at the skull base are preserved. Skull and upper cervical spine: Normal marrow signal is preserved. Sinuses/Orbits: Paranasal sinuses are aerated. Orbits are unremarkable. Other: Expanded, "empty" sella.  Mastoid air cells are clear. IMPRESSION: No acute infarction, hemorrhage, or mass. Moderate chronic microvascular ischemic changes. Electronically Signed   By: Macy Mis M.D.   On: 04/23/2022 16:33   MR THORACIC SPINE WO CONTRAST  Addendum Date: 04/25/2022   ADDENDUM REPORT: 04/25/2022 11:46 ADDENDUM: Study discussed by telephone with the patient's nurse Olayinka on 04/25/2022 at 1135 hours. Electronically Signed   By: Genevie Ann M.D.   On: 04/25/2022 11:46   Result Date: 04/25/2022 CLINICAL DATA:  81 year old female with probable lower thoracic spinal cord stenosis and cord signal abnormality on lumbar MRI 2 days ago. EXAM: MRI THORACIC SPINE WITHOUT CONTRAST TECHNIQUE: Multiplanar,  multisequence MR imaging of the thoracic spine was performed. No intravenous contrast was administered. COMPARISON:  Lumbar MRI 04/23/2022. FINDINGS: Limited cervical spine imaging: Widespread cervical spine degeneration with evidence of some spondylolisthesis at C5. Advanced cervical disc space loss, endplate spurring. But only mild cervical spinal stenosis suspected. Thoracic spine segmentation:  Appears to be normal. Alignment: Mild dextroconvex thoracic scoliosis. Relatively preserved thoracic kyphosis. Subtle degenerative appearing anterolisthesis at both T1-T2 and T2-T3. Vertebrae: Underlying evidence of T3-T4 interbody ankylosis. Widespread chronic degenerative thoracic endplate marrow signal changes. Background bone marrow signal within normal limits. No convincing marrow edema or acute osseous abnormality. Cord: Thoracic spinal cord signal and morphology remain normal above T11. But there is degenerative cord compression with abnormal cord signal confirmed at T11-T12 (series 17, image 36). Conus medullaris below that appears stable from the recent lumbar MRI, terminating just below T12-L1. Paraspinal and other soft tissues: Negative visible chest and upper abdominal viscera aside from probable tortuosity of the thoracic aorta. Thoracic paraspinal soft tissues remain within normal limits. Disc levels: T1-T2: Mild anterolisthesis with disc bulging. Up to moderate facet and ligament flavum hypertrophy. No spinal stenosis. Moderate to severe bilateral T1 foraminal stenosis. T2-T3: Similar anterolisthesis. Disc space loss and disc bulging eccentric to the right. Moderate facet and ligament flavum hypertrophy. Borderline to mild spinal stenosis (series 17, image 8), no convincing cord mass effect. Moderate right T2 foraminal stenosis. T3-T4: Interbody ankylosis. Posterior element hypertrophy. No spinal stenosis. Moderate bilateral T3 foraminal stenosis. T4-T5: Circumferential disc bulge with endplate spurring and  moderate to severe facet and ligament flavum hypertrophy. No spinal stenosis. Moderate to severe left greater than right T4 foraminal stenosis. T5-T6: Advanced disc space loss. Bulky circumferential disc osteophyte complex. Moderate facet and ligament flavum hypertrophy. But no significant spinal stenosis. Moderate to severe left and mild right T5 foraminal stenosis. T6-T7: Disc space loss with bulky circumferential disc osteophyte complex eccentric to the left. Moderate facet and ligament flavum hypertrophy. No spinal stenosis. Severe left T6 foraminal stenosis. T7-T8: Similar disc space loss. Bulky circumferential disc osteophyte complex. Severe facet and ligament flavum hypertrophy. Degenerative facet joint fluid. Mild spinal stenosis (series 17, image 23). No cord mass effect. Severe left and mild to moderate right T7 foraminal stenosis. T8-T9: Circumferential disc osteophyte  complex eccentric to the left. Moderate to severe facet and ligament flavum hypertrophy. Borderline spinal stenosis. Severe left and mild to moderate right T8 foraminal stenosis. T9-T10: Disc space loss with circumferential disc osteophyte complex. Mild to moderate facet and ligament flavum hypertrophy. No spinal stenosis. Severe left and mild-to-moderate right T9 foraminal stenosis. T10-T11: Circumferential disc osteophyte complex with mild to moderate facet and ligament flavum hypertrophy. No significant spinal stenosis. Moderate to severe bilateral T10 foraminal stenosis. T11-T12: Disc space loss with bulky circumferential disc osteophyte complex and severe facet and ligament flavum hypertrophy. Degenerative facet joint fluid. Moderate to severe spinal stenosis with cord mass effect. Abnormal cord signal here more resembles myelomalacia than spinal cord edema (just below the disc space level series 17, image 37). Severe bilateral T11 foraminal stenosis. T12-L1: Disc space loss with bulky circumferential disc osteophyte complex. Moderate  facet and ligament flavum hypertrophy. Mild spinal stenosis with up to mild mass effect here at the conus. No conus signal abnormality identified. Mild to moderate left and moderate to severe right T12 foraminal stenosis. IMPRESSION: 1. 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 Associated severe bilateral T11 foraminal stenosis. 2. Widespread advanced thoracic spine degeneration elsewhere. Additional mild spinal stenosis at T2-T3, T7-T8, and T12-L1. Up to mild mass effect on the conus at the latter, but no additional thoracic cord signal abnormality identified. 3. Widespread moderate or severe degenerative neural foraminal stenosis at multiple other thoracic levels. 4. 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 No acute osseous abnormality. Electronically Signed: By: Genevie Ann M.D. On: 04/25/2022 11:30   MR LUMBAR SPINE WO CONTRAST  Result Date: 04/23/2022 CLINICAL DATA:  Low back pain, prior surgery, new symptoms left leg weakness EXAM: MRI LUMBAR SPINE WITHOUT CONTRAST TECHNIQUE: Multiplanar, multisequence MR imaging of the lumbar spine was performed. No intravenous contrast was administered. COMPARISON:  CT 04/23/2022 FINDINGS: Segmentation:  Standard. Alignment:  Grade 1 anterolisthesis of L4 on L5. Vertebrae: No acute fracture. No suspicious bone lesion. Bilateral facet joint effusions at L5-S1. Small amount of fluid within the L5-S1 disc space without associated endplate marrow edema. Vacuum disc phenomenon is present at this level. No paravertebral inflammatory changes or other evidence to suggest discitis. Conus medullaris and cauda equina: Conus extends to the L1 level. Increased T2/STIR signal within the lower thoracic cord at the T11-12 level. Conus and cauda equina appear normal. Paraspinal and other soft tissues: Posterior paraspinal muscle atrophy. Postsurgical  changes present at the midline of the lumbar spine. Thin postoperative seroma along the incision site superficially. Disc levels: T11-T12: Sagittal sequences only. Diffuse disc bulge and posterior element hypertrophy resulting in severe canal stenosis with severe bilateral foraminal stenosis. T12-L1: Disc bulge and endplate spurring. Bilateral facet arthropathy and ligamentum flavum buckling. Moderate canal stenosis with severe right and mild left foraminal stenosis. L1-L2: Mild disc bulge and endplate spurring. Mild bilateral facet arthropathy. Mild canal stenosis with moderate bilateral foraminal stenosis, right worse than left. L2-L3: Prior posterior decompression. Disc bulge and endplate spurring. Bilateral facet arthropathy. No canal stenosis. Mild bilateral foraminal stenosis. L3-L4: Prior posterior decompression. Disc bulge and endplate spurring. Bilateral facet arthropathy. No canal stenosis. Severe left and mild right foraminal stenosis. L4-L5: Prior posterior decompression. Diffuse endplate spurring and moderate bilateral facet arthropathy. No canal stenosis. Mild-moderate right foraminal stenosis. L5-S1: Prior posterior decompression. Diffuse endplate spurring with severe bilateral facet arthropathy. Severe bilateral subarticular recess stenosis without canal stenosis. Severe  bilateral foraminal stenosis. IMPRESSION: 1. 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 2. Multilevel bilateral foraminal stenosis, severe at T11-T12, L3-4, and L5-S1. 3. Severe bilateral facet arthropathy of L5-S1 with associated facet joint effusions, likely reactive. 4. Small amount of fluid within the L5-S1 disc space is favored degenerative. No secondary bony or soft tissue changes to suggest discitis. Correlate with  serum inflammatory markers if there is clinical suspicion for infection. Electronically Signed   By: Davina Poke D.O.   On: 04/23/2022 16:37

## 2022-04-26 NOTE — TOC Initial Note (Signed)
Transition of Care [facility]) - Initial/Assessment Note    Patient Details  Name: Shelley Mitchell MRN: [id number] Date of Birth: Oct 17, 1941  Transition of Care [facility]) CM/SW Contact:    Benard Halsted, LCSW Phone Number: 04/26/2022, 4:31 PM  Clinical Narrative:                 CSW received consult for possible SNF placement at time of discharge. CSW spoke with patient. Patient reported that she lives alone. Patient expressed understanding of PT recommendation and is agreeable to SNF placement at time of discharge. Patient reports preference for a nice facility. CSW discussed insurance authorization process and will provide Medicare SNF ratings list. Patient does not want [place] or [place], but is open to reviewing facilities based off of ratings. She declined for CSW to contact any family at this time. Patient has received COVID vaccines. CSW will send out referrals for review.   Skilled Nursing Rehab Facilities-   [email or web address]   Ratings out of 5 possible   Name Address  Phone # [place] Inspection Overall  [facility] [address], [place] '4 5 2 3  '$ [facility]  [address], [place] [phone] '3 2 5 5  '$ [facility] [place], [place] '3 1 1 1  '$ [facility] [place], [place] '3 2 4 4  '$ [facility] [address], [place] '1 1 2 1  '$ [place] [facility] [address] [place] [phone] '2 1 4 3  '$ [facility] [address] [place] '5 2 3 4  '$ [facility] [address] [place] '5 2 2 3  '$ [address] ([place]) [place] '5 1 2 2  '$ [facility] [phone], [place] [phone] '4 1 2 1  '$ [facility] [address], [place] [facility] [phone] '4 1 2 1  '$ [facility] ([place]) [place]. [place], [place]  [phone] '4 1 1 1  '$ Dustin Flock [address] [place] [phone] '3 2 4 4          '$ [facility] [place] '4 2 3 3  '$ [facility]  [address], [place] '4 1 5 4  '$ [facility], [place] [address], [place] [phone] '2 1 1 1  '$ [facility] [address] [phone] '2 1 3 2          '$ [address] (no [facility]) [address], [place] [phone] '4 5 5 5  '$ [facility] (No Humana) [address] [phone] [address] [place], [place] '3 1 4 3  '$ [facility] (No UHC) [place], [place] '5 5 5 5  '$ [facility] [address] [phone] '3 2 4 4  '$ [facility] [address] [place] '1 1 2 1  '$ [facility] [address], [address] [place] '2 1 1 1  '$ [facility] [place], [place] '5 2 4 5  '$ [facility] [address], [place] '3 1 1 1  '$ [facility] [place], [place] '2 1 2 1          '$ [facility] [address], [place] [phone] '1 1 1 1  '$ [phone] [address], [place]  [phone] '2 4 2 2  '$ [facility]  [address], [place] [phone] '5 2 3 4  '$ [facility] [address] [place] '2 1 1 1  '$ [place] (No Humana) [address] [place] [phone] '2 1 3 2  '$ [facility] [address], [place] [phone] '3 1 1 1          '$ [facility] [place], [place] '5 4 5 5  '$ [facility])  [address], [place] '2 2 3 3  '$ [facility]) [place] [address] [place] '3 2 4 4  '$ [facility] [address] [place] '4 3 4 4  '$ [address] [place], [place] '3 3 1 1  '$ [facility]) [address] [place] [phone] '2 2 4 4     '$ Expected Discharge Plan: [place] Barriers to Discharge: Insurance Authorization, Continued Medical Work up, SNF Pending bed offer   Patient Goals and CMS Choice Patient states their goals for this hospitalization and ongoing recovery are:: Rehab CMS Medicare.gov Compare Post Acute Care list provided to:: Patient Choice offered to / list presented to : Patient  Expected Discharge Plan and Services Expected Discharge Plan: [place] In-house Referral: Clinical Social Work   Post Acute Care Choice: [place] Living arrangements for the past 2 months: [place]                                      Prior Living Arrangements/Services Living arrangements for the past 2 months: Single Family Home Lives with:: Self Patient language and need for interpreter reviewed:: Yes Do you feel safe going back to the place where you live?: Yes      Need for Family Participation in Patient Care: No (Comment) Care giver support system in place?: No (comment)   Criminal Activity/Legal Involvement Pertinent to Current Situation/Hospitalization: No - Comment as needed  Activities of Daily Living Home Assistive Devices/Equipment: Bedside commode/3-in-1, Crutches, CPAP, Dentures (specify type), Eyeglasses, Grab bars in shower, Grab bars around toilet, Hand-held shower hose, Reacher, Scales, Shower chair with back, Environmental consultant (specify type), Wheelchair ADL Screening (condition at time of admission) Patient's cognitive ability adequate to safely complete daily activities?: No Is the patient deaf or have difficulty hearing?: Yes Does the patient have difficulty seeing, even when wearing glasses/contacts?: Yes Does the patient have difficulty concentrating, remembering, or making decisions?: Yes Patient able to express need for assistance with ADLs?: Yes Does the patient have  difficulty dressing or bathing?: Yes Independently performs ADLs?: No (has a nurses aide come in three days a week home health to bathe her) Communication: Appropriate for developmental age Dressing (OT): Needs assistance Is this a change from baseline?: Pre-admission baseline Grooming: Needs assistance Is this a change from baseline?: Pre-admission baseline Feeding: Independent Bathing: Independent Toileting: Independent In/Out Bed: Independent Walks in Home: Needs assistance (walker and wheelchair manual and mobile ride) Is this a change from baseline?: Pre-admission baseline Does the patient have difficulty walking or climbing stairs?: Yes Weakness of Legs: Both Weakness of Arms/Hands: Both  Permission Sought/Granted Permission sought to share information with : Facility Art therapist granted to share information with : Yes, Verbal Permission Granted     Permission granted to share info w AGENCY: SNFs        Emotional Assessment Appearance:: Appears stated age  Attitude/Demeanor/Rapport: Engaged Affect (typically observed): Accepting, Appropriate, Pleasant Orientation: : Oriented to Self, Oriented to Place, Oriented to  Time, Oriented to Situation Alcohol / Substance Use: Not Applicable Psych Involvement: No (comment)  Admission diagnosis:  Spinal stenosis [M48.00] Leg weakness [R29.898] Weakness of left lower extremity [R29.898] Patient Active Problem List   Diagnosis Date Noted   Leg weakness 04/23/2022   Essential hypertension 04/23/2022   Hyperlipidemia 04/23/2022   Anxiety 04/23/2022   Spinal stenosis 04/23/2022   Osteoarthritis    OSA (obstructive sleep apnea) 04/22/2021   Constipation 08/15/2019   Screening for colorectal cancer 01/29/2019   PMB (postmenopausal bleeding) 12/20/2017   History of endometrial hyperplasia 12/20/2017   Encounter for well woman exam with routine gynecological exam 12/20/2017   Hx of adenomatous colonic polyps  05/31/2011   PCP:  [name], MD Pharmacy:   [place], [place] - [address] [place] [address] Phone: [phone] Fax: [phone]  [place], [place] - [address] [place] [address] Phone: [phone] Fax: [phone]  [facility] [place], [place] [address] Phone: [phone] Fax: [phone]     Social Determinants of Health (SDOH) Interventions    Readmission Risk Interventions     View : No data to display.

## 2022-04-26 NOTE — Progress Notes (Signed)
Patient ID: Shelley Mitchell, female   DOB: [birth year], 81 y.o.   MRN: [id number] BP 135/66 (BP Location: Left Arm)   Pulse 68   Temp 99.7 F (37.6 C) (Oral)   Resp 18   Ht '5\' 1"'$  (1.549 m)   Wt 111.1 kg   SpO2 98%   BMI 46.29 kg/m  Alert and oriented x 4, speech is clear and fluent Moving extremities Will plan on OR tomorrow afternoon Have spoken with [name] and family they are in agreement.

## 2022-04-27 ENCOUNTER — Inpatient Hospital Stay (HOSPITAL_COMMUNITY): Payer: Medicare PPO | Admitting: Anesthesiology

## 2022-04-27 ENCOUNTER — Other Ambulatory Visit: Payer: Self-pay

## 2022-04-27 ENCOUNTER — Encounter (HOSPITAL_COMMUNITY)
Admission: EM | Disposition: A | Discharge status: Skilled Nursing Facility | Payer: Self-pay | Source: Home / Self Care | Attending: Internal Medicine

## 2022-04-27 ENCOUNTER — Encounter (HOSPITAL_COMMUNITY): Payer: Self-pay | Admitting: Internal Medicine

## 2022-04-27 ENCOUNTER — Other Ambulatory Visit: Payer: Self-pay | Admitting: Neurosurgery

## 2022-04-27 ENCOUNTER — Inpatient Hospital Stay (HOSPITAL_COMMUNITY): Payer: Medicare PPO

## 2022-04-27 DIAGNOSIS — M4804 Spinal stenosis, thoracic region: Secondary | ICD-10-CM

## 2022-04-27 DIAGNOSIS — E783 Hyperchylomicronemia: Secondary | ICD-10-CM | POA: Diagnosis not present

## 2022-04-27 HISTORY — PX: LUMBAR LAMINECTOMY/DECOMPRESSION MICRODISCECTOMY: SHX5026

## 2022-04-27 LAB — COMPREHENSIVE METABOLIC PANEL
ALT: 12 U/L (ref 0–44)
AST: 11 U/L — ABNORMAL LOW (ref 15–41)
Albumin: 3 g/dL — ABNORMAL LOW (ref 3.5–5.0)
Alkaline Phosphatase: 43 U/L (ref 38–126)
Anion gap: 6 (ref 5–15)
BUN: 18 mg/dL (ref 8–23)
CO2: 23 mmol/L (ref 22–32)
Calcium: 9.2 mg/dL (ref 8.9–10.3)
Chloride: 102 mmol/L (ref 98–111)
Creatinine, Ser: 0.7 mg/dL (ref 0.44–1.00)
GFR, Estimated: >60 mL/min (ref >60)
Glucose, Bld: 128 mg/dL — ABNORMAL HIGH (ref 70–99)
Potassium: 3.7 mmol/L (ref 3.5–5.1)
Sodium: 131 mmol/L — ABNORMAL LOW (ref 135–145)
Total Bilirubin: 0.5 mg/dL (ref 0.3–1.2)
Total Protein: 6.6 g/dL (ref 6.5–8.1)

## 2022-04-27 LAB — OSMOLALITY, URINE: Osmolality, Ur: 764 mOsm/kg (ref 300–900)

## 2022-04-27 LAB — CBC WITH DIFFERENTIAL/PLATELET
Abs Immature Granulocytes: 0.04 10*3/uL (ref 0.00–0.07)
Basophils Absolute: 0 10*3/uL (ref 0.0–0.1)
Basophils Relative: 0 %
Eosinophils Absolute: 0 10*3/uL (ref 0.0–0.5)
Eosinophils Relative: 0 %
HCT: 30.1 % — ABNORMAL LOW (ref 36.0–46.0)
Hemoglobin: 10.4 g/dL — ABNORMAL LOW (ref 12.0–15.0)
Immature Granulocytes: 0 %
Lymphocytes Relative: 8 %
Lymphs Abs: 0.9 10*3/uL (ref 0.7–4.0)
MCH: 29.1 pg (ref 26.0–34.0)
MCHC: 34.6 g/dL (ref 30.0–36.0)
MCV: 84.3 fL (ref 80.0–100.0)
Monocytes Absolute: 1.3 10*3/uL — ABNORMAL HIGH (ref 0.1–1.0)
Monocytes Relative: 11 %
Neutro Abs: 9.8 10*3/uL — ABNORMAL HIGH (ref 1.7–7.7)
Neutrophils Relative %: 81 %
Platelets: 215 10*3/uL (ref 150–400)
RBC: 3.57 MIL/uL — ABNORMAL LOW (ref 3.87–5.11)
RDW: 14.5 % (ref 11.5–15.5)
WBC: 12.1 10*3/uL — ABNORMAL HIGH (ref 4.0–10.5)
nRBC: 0 % (ref 0.0–0.2)

## 2022-04-27 LAB — MAGNESIUM: Magnesium: 2.1 mg/dL (ref 1.7–2.4)

## 2022-04-27 LAB — SURGICAL PCR SCREEN
MRSA, PCR: NEGATIVE
Staphylococcus aureus: NEGATIVE

## 2022-04-27 LAB — OSMOLALITY: Osmolality: 286 mOsm/kg (ref 275–295)

## 2022-04-27 LAB — SODIUM, URINE, RANDOM: Sodium, Ur: <10 mmol/L

## 2022-04-27 LAB — CREATININE, URINE, RANDOM: Creatinine, Urine: 154.74 mg/dL

## 2022-04-27 LAB — URIC ACID: Uric Acid, Serum: 5.1 mg/dL (ref 2.5–7.1)

## 2022-04-27 SURGERY — LUMBAR LAMINECTOMY/DECOMPRESSION MICRODISCECTOMY 1 LEVEL
Anesthesia: General | Site: Spine Thoracic

## 2022-04-27 MED ORDER — PHENYLEPHRINE 80 MCG/ML (10ML) SYRINGE FOR IV PUSH (FOR BLOOD PRESSURE SUPPORT)
PREFILLED_SYRINGE | INTRAVENOUS | Status: AC
  Filled 2022-04-27: qty 10

## 2022-04-27 MED ORDER — LIDOCAINE 2% (20 MG/ML) 5 ML SYRINGE
INTRAMUSCULAR | Status: AC
  Filled 2022-04-27: qty 5

## 2022-04-27 MED ORDER — PROPOFOL 10 MG/ML IV BOLUS
INTRAVENOUS | Status: DC | PRN
  Administered 2022-04-27: 100 mg via INTRAVENOUS

## 2022-04-27 MED ORDER — LIDOCAINE-EPINEPHRINE 0.5 %-1:200000 IJ SOLN
INTRAMUSCULAR | Status: AC
  Filled 2022-04-27: qty 1

## 2022-04-27 MED ORDER — METOPROLOL TARTRATE 5 MG/5ML IV SOLN: 5.0000 mg | INTRAVENOUS | Status: DC | PRN

## 2022-04-27 MED ORDER — CEFAZOLIN SODIUM-DEXTROSE 2-3 GM-%(50ML) IV SOLR
INTRAVENOUS | Status: DC | PRN
  Administered 2022-04-27: 2 g via INTRAVENOUS

## 2022-04-27 MED ORDER — OXYCODONE HCL 5 MG PO TABS: 5.0000 mg | ORAL_TABLET | Freq: Once | ORAL | Status: AC | PRN

## 2022-04-27 MED ORDER — THROMBIN 5000 UNITS EX SOLR
CUTANEOUS | Status: AC
  Filled 2022-04-27: qty 10000

## 2022-04-27 MED ORDER — FENTANYL CITRATE (PF) 100 MCG/2ML IJ SOLN: 25.0000 ug | INTRAMUSCULAR | Status: DC | PRN

## 2022-04-27 MED ORDER — CEFAZOLIN SODIUM 1 G IJ SOLR
INTRAMUSCULAR | Status: AC
  Filled 2022-04-27: qty 20

## 2022-04-27 MED ORDER — MUPIROCIN 2 % EX OINT
1.0000 "application " | TOPICAL_OINTMENT | Freq: Two times a day (BID) | CUTANEOUS | Status: DC
  Administered 2022-04-27: 1 via NASAL
  Filled 2022-04-27: qty 44
  Filled 2022-04-27: qty 22

## 2022-04-27 MED ORDER — ARTIFICIAL TEARS OPHTHALMIC OINT
TOPICAL_OINTMENT | OPHTHALMIC | Status: AC
  Filled 2022-04-27: qty 3.5

## 2022-04-27 MED ORDER — KCL IN DEXTROSE-NACL 10-5-0.45 MEQ/L-%-% IV SOLN
INTRAVENOUS | Status: DC
  Filled 2022-04-27: qty 1000

## 2022-04-27 MED ORDER — DEXAMETHASONE SODIUM PHOSPHATE 10 MG/ML IJ SOLN
INTRAMUSCULAR | Status: AC
  Filled 2022-04-27: qty 1

## 2022-04-27 MED ORDER — BUPIVACAINE HCL (PF) 0.5 % IJ SOLN
INTRAMUSCULAR | Status: DC | PRN
  Administered 2022-04-27: 30 mL

## 2022-04-27 MED ORDER — CHLORHEXIDINE GLUCONATE 0.12 % MT SOLN
OROMUCOSAL | Status: AC
  Administered 2022-04-27: 15 mL via OROMUCOSAL
  Filled 2022-04-27: qty 15

## 2022-04-27 MED ORDER — ROCURONIUM BROMIDE 10 MG/ML (PF) SYRINGE
PREFILLED_SYRINGE | INTRAVENOUS | Status: AC
  Filled 2022-04-27: qty 10

## 2022-04-27 MED ORDER — SUGAMMADEX SODIUM 200 MG/2ML IV SOLN
INTRAVENOUS | Status: DC | PRN
  Administered 2022-04-27: 200 mg via INTRAVENOUS

## 2022-04-27 MED ORDER — LIDOCAINE 2% (20 MG/ML) 5 ML SYRINGE
INTRAMUSCULAR | Status: DC | PRN
  Administered 2022-04-27: 40 mg via INTRAVENOUS

## 2022-04-27 MED ORDER — FENTANYL CITRATE (PF) 250 MCG/5ML IJ SOLN
INTRAMUSCULAR | Status: DC | PRN
  Administered 2022-04-27 (×2): 50 ug via INTRAVENOUS
  Administered 2022-04-27: 100 ug via INTRAVENOUS

## 2022-04-27 MED ORDER — ONDANSETRON HCL 4 MG/2ML IJ SOLN
INTRAMUSCULAR | Status: DC | PRN
  Administered 2022-04-27: 4 mg via INTRAVENOUS

## 2022-04-27 MED ORDER — BUPIVACAINE HCL (PF) 0.5 % IJ SOLN
INTRAMUSCULAR | Status: AC
  Filled 2022-04-27: qty 30

## 2022-04-27 MED ORDER — ROCURONIUM BROMIDE 10 MG/ML (PF) SYRINGE
PREFILLED_SYRINGE | INTRAVENOUS | Status: DC | PRN
  Administered 2022-04-27: 30 mg via INTRAVENOUS
  Administered 2022-04-27: 50 mg via INTRAVENOUS

## 2022-04-27 MED ORDER — LIDOCAINE-EPINEPHRINE 0.5 %-1:200000 IJ SOLN
INTRAMUSCULAR | Status: DC | PRN
  Administered 2022-04-27: 10 mL

## 2022-04-27 MED ORDER — THROMBIN 5000 UNITS EX SOLR: OROMUCOSAL | Status: DC | PRN

## 2022-04-27 MED ORDER — ONDANSETRON HCL 4 MG/2ML IJ SOLN: 4.0000 mg | Freq: Four times a day (QID) | INTRAMUSCULAR | Status: DC | PRN

## 2022-04-27 MED ORDER — ORAL CARE MOUTH RINSE: 15.0000 mL | Freq: Once | OROMUCOSAL | Status: AC

## 2022-04-27 MED ORDER — 0.9 % SODIUM CHLORIDE (POUR BTL) OPTIME
TOPICAL | Status: DC | PRN
  Administered 2022-04-27: 1000 mL

## 2022-04-27 MED ORDER — DEXAMETHASONE SODIUM PHOSPHATE 10 MG/ML IJ SOLN
INTRAMUSCULAR | Status: DC | PRN
  Administered 2022-04-27: 5 mg via INTRAVENOUS

## 2022-04-27 MED ORDER — THROMBIN 5000 UNITS EX SOLR
CUTANEOUS | Status: AC
  Filled 2022-04-27: qty 5000

## 2022-04-27 MED ORDER — ONDANSETRON HCL 4 MG/2ML IJ SOLN
INTRAMUSCULAR | Status: AC
  Filled 2022-04-27: qty 2

## 2022-04-27 MED ORDER — PROPOFOL 10 MG/ML IV BOLUS
INTRAVENOUS | Status: AC
  Filled 2022-04-27: qty 20

## 2022-04-27 MED ORDER — THROMBIN (RECOMBINANT) 5000 UNITS EX SOLR: CUTANEOUS | Status: DC | PRN

## 2022-04-27 MED ORDER — OXYCODONE HCL 5 MG/5ML PO SOLN
ORAL | Status: AC
  Filled 2022-04-27: qty 5

## 2022-04-27 MED ORDER — FENTANYL CITRATE (PF) 250 MCG/5ML IJ SOLN
INTRAMUSCULAR | Status: AC
  Filled 2022-04-27: qty 5

## 2022-04-27 MED ORDER — LACTATED RINGERS IV SOLN: INTRAVENOUS | Status: DC

## 2022-04-27 MED ORDER — OXYCODONE HCL 5 MG/5ML PO SOLN
5.0000 mg | Freq: Once | ORAL | Status: AC | PRN
  Administered 2022-04-27: 5 mg via ORAL

## 2022-04-27 MED ORDER — CHLORHEXIDINE GLUCONATE 0.12 % MT SOLN: 15.0000 mL | Freq: Once | OROMUCOSAL | Status: AC

## 2022-04-27 MED ORDER — LACTATED RINGERS IV SOLN: INTRAVENOUS | Status: AC

## 2022-04-27 SURGICAL SUPPLY — 44 items
ADH SKN CLS APL DERMABOND .7 (GAUZE/BANDAGES/DRESSINGS) ×1
APL SKNCLS STERI-STRIP NONHPOA (GAUZE/BANDAGES/DRESSINGS)
BAG COUNTER SPONGE SURGICOUNT (BAG) ×4 IMPLANT
BAG SPNG CNTER NS LX DISP (BAG) ×2
BAND INSRT 18 STRL LF DISP RB (MISCELLANEOUS)
BAND RUBBER #18 3X1/16 STRL (MISCELLANEOUS) ×4 IMPLANT
BENZOIN TINCTURE PRP APPL 2/3 (GAUZE/BANDAGES/DRESSINGS) IMPLANT
BUR MATCHSTICK NEURO 3.0 LAGG (BURR) ×3 IMPLANT
BUR PRECISION FLUTE 5.0 (BURR) IMPLANT
CANISTER SUCT 3000ML PPV (MISCELLANEOUS) ×3 IMPLANT
CARTRIDGE OIL MAESTRO DRILL (MISCELLANEOUS) ×2 IMPLANT
DERMABOND ADVANCED (GAUZE/BANDAGES/DRESSINGS) ×1
DERMABOND ADVANCED .7 DNX12 (GAUZE/BANDAGES/DRESSINGS) ×2 IMPLANT
DIFFUSER DRILL AIR PNEUMATIC (MISCELLANEOUS) ×3 IMPLANT
DRAPE LAPAROTOMY 100X72X124 (DRAPES) ×3 IMPLANT
DRAPE MICROSCOPE LEICA (MISCELLANEOUS) ×2 IMPLANT
DRSG OPSITE POSTOP 4X6 (GAUZE/BANDAGES/DRESSINGS) ×1 IMPLANT
DURAPREP 26ML APPLICATOR (WOUND CARE) ×3 IMPLANT
ELECT REM PT RETURN 9FT ADLT (ELECTROSURGICAL) ×2
ELECTRODE REM PT RTRN 9FT ADLT (ELECTROSURGICAL) ×2 IMPLANT
GLOVE ECLIPSE 6.5 STRL STRAW (GLOVE) ×4 IMPLANT
GOWN STRL REUS W/ TWL LRG LVL3 (GOWN DISPOSABLE) ×4 IMPLANT
GOWN STRL REUS W/ TWL XL LVL3 (GOWN DISPOSABLE) IMPLANT
GOWN STRL REUS W/TWL 2XL LVL3 (GOWN DISPOSABLE) IMPLANT
GOWN STRL REUS W/TWL LRG LVL3 (GOWN DISPOSABLE) ×6
GOWN STRL REUS W/TWL XL LVL3 (GOWN DISPOSABLE) ×2
HEMOSTAT POWDER KIT SURGIFOAM (HEMOSTASIS) ×1 IMPLANT
KIT BASIN OR (CUSTOM PROCEDURE TRAY) ×3 IMPLANT
KIT TURNOVER KIT B (KITS) ×3 IMPLANT
NDL HYPO 25X1 1.5 SAFETY (NEEDLE) ×2 IMPLANT
NDL SPNL 18GX3.5 QUINCKE PK (NEEDLE) IMPLANT
NEEDLE HYPO 25X1 1.5 SAFETY (NEEDLE) ×2 IMPLANT
NEEDLE SPNL 18GX3.5 QUINCKE PK (NEEDLE) IMPLANT
NS IRRIG 1000ML POUR BTL (IV SOLUTION) ×3 IMPLANT
OIL CARTRIDGE MAESTRO DRILL (MISCELLANEOUS) ×2
PACK LAMINECTOMY NEURO (CUSTOM PROCEDURE TRAY) ×3 IMPLANT
SPONGE SURGIFOAM ABS GEL SZ50 (HEMOSTASIS) ×3 IMPLANT
SUT VIC AB 0 CT1 18XCR BRD8 (SUTURE) ×2 IMPLANT
SUT VIC AB 0 CT1 8-18 (SUTURE) ×2
SUT VIC AB 2-0 CT1 18 (SUTURE) ×3 IMPLANT
SUT VIC AB 3-0 SH 8-18 (SUTURE) ×3 IMPLANT
TOWEL GREEN STERILE (TOWEL DISPOSABLE) ×3 IMPLANT
TOWEL GREEN STERILE FF (TOWEL DISPOSABLE) ×3 IMPLANT
WATER STERILE IRR 1000ML POUR (IV SOLUTION) ×3 IMPLANT

## 2022-04-27 NOTE — Progress Notes (Signed)
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 PMH includes hyperlipidemia, anxiety, obesity, OA, and tachycardia.   OT comments  Pt progressing towards goals, able to sit EOB for ~10 min for seated ADL task before fatiguing, pt needing min A for ADLs, and increased tactile cues for sitting balance. Pt max A +2 for transfers and bed mobility using log rolling technique. Noted pt going for surgery this PM, will follow and assess next session. Pt presenting with impairments listed below, will follow acutely, recommend SNF at d/c.   Recommendations for follow up therapy are one component of a multi-disciplinary discharge planning process, led by the attending physician.  Recommendations may be updated based on patient status, additional functional criteria and insurance authorization.    Follow Up Recommendations  Skilled nursing-short term rehab (<3 hours/day)    Assistance Recommended at Discharge Intermittent Supervision/Assistance  Patient can return home with the following  Two people to help with walking and/or transfers;A lot of help with bathing/dressing/bathroom;Assistance with cooking/housework;Direct supervision/assist for medications management;Direct supervision/assist for financial management;Help with stairs or ramp for entrance;Assist for transportation   Equipment Recommendations  None recommended by OT;Other (comment) (pt has all needed DME)    Recommendations for Other Services PT consult    Precautions / Restrictions Precautions Precautions: Fall Restrictions Weight Bearing Restrictions: No       Mobility Bed Mobility Overal bed  mobility: Needs Assistance Bed Mobility: Sidelying to Sit Rolling: Max assist, +2 for physical assistance Sidelying to sit: Max assist, +2 for physical assistance, HOB elevated     Sit to sidelying: Max assist, +2 for physical assistance General bed mobility comments: for use of log rolling technique    Transfers Overall transfer level: Needs assistance Equipment used: 2 person hand held assist Transfers: Sit to/from Stand Sit to Stand: Max assist, +2 physical assistance           General transfer comment: use of pad     Balance Overall balance assessment: Needs assistance Sitting-balance support: Feet supported Sitting balance-Leahy Scale: Poor Sitting balance - Comments: requires mingurard with tendency to lean to her left as she fatigues; some posterior lean as well Postural control: Posterior lean, Left lateral lean Standing balance support: During functional activity, Reliant on assistive device for balance, Bilateral upper extremity supported Standing balance-Leahy Scale: Zero Standing balance comment: with +2 max assist                           ADL either performed or assessed with clinical judgement   ADL Overall ADL's : Needs assistance/impaired Eating/Feeding: Set up;Sitting   Grooming: Minimal assistance;Sitting Grooming Details (indicate cue type and reason): for oral care and washing face sitting EOB                             Functional mobility during ADLs: Maximal assistance;+2 for physical assistance      Extremity/Trunk Assessment Upper Extremity Assessment Upper Extremity Assessment: RUE deficits/detail RUE Deficits / Details: pt/son reporting pt needs shoulder replacement, pain with movement RUE: Unable to fully assess due to pain;Shoulder pain at rest RUE  Coordination: decreased gross motor;decreased fine motor   Lower Extremity Assessment Lower Extremity Assessment: Defer to PT evaluation        Vision   Vision  Assessment?: No apparent visual deficits   Perception Perception Perception: Not tested   Praxis Praxis Praxis: Not tested    Cognition Arousal/Alertness: Awake/alert Behavior During Therapy: WFL for tasks assessed/performed, Flat affect Overall Cognitive Status: Within Functional Limits for tasks assessed                                          Exercises      Shoulder Instructions       General Comments VSS on RA during session, son present in room    Pertinent Vitals/ Pain       Pain Assessment Pain Assessment: Faces Pain Score: 5  Pain Location: R shoulder, pt/son reporting pt needing replacement Pain Descriptors / Indicators: Discomfort, Grimacing, Guarding Pain Intervention(s): Limited activity within patient's tolerance, Monitored during session, Repositioned  Home Living                                          Prior Functioning/Environment              Frequency  Min 2X/week        Progress Toward Goals  OT Goals(current goals can now be found in the care plan section)  Progress towards OT goals: Progressing toward goals  Acute Rehab OT Goals Patient Stated Goal: none stated OT Goal Formulation: With patient Time For Goal Achievement: 05/08/22 Potential to Achieve Goals: Good ADL Goals Pt Will Perform Upper Body Dressing: with min guard assist;sitting Pt Will Perform Lower Body Dressing: with mod assist;sit to/from stand;sitting/lateral leans Pt Will Transfer to Toilet: with mod assist;with +2 assist;bedside commode;stand pivot transfer;squat pivot transfer  Plan Discharge plan remains appropriate;Frequency remains appropriate    Co-evaluation    PT/OT/SLP Co-Evaluation/Treatment: Yes Reason for Co-Treatment: Complexity of the patient's impairments (multi-system involvement);For patient/therapist safety;To address functional/ADL transfers PT goals addressed during session: Mobility/safety with  mobility;Balance;Strengthening/ROM OT goals addressed during session: ADL's and self-care      AM-PAC OT "6 Clicks" Daily Activity     Outcome Measure   Help from another person eating meals?: None Help from another person taking care of personal grooming?: A Little Help from another person toileting, which includes using toliet, bedpan, or urinal?: A Lot Help from another person bathing (including washing, rinsing, drying)?: A Lot Help from another person to put on and taking off regular upper body clothing?: A Lot Help from another person to put on and taking off regular lower body clothing?: Total 6 Click Score: 14    End of Session Equipment Utilized During Treatment: Gait belt  OT Visit Diagnosis: Unsteadiness on feet (R26.81);Other abnormalities of gait and mobility (R26.89);Muscle weakness (generalized) (M62.81)   Activity Tolerance Patient tolerated treatment well   Patient Left in bed;with call bell/phone within reach;with bed alarm set;with family/visitor present   Nurse Communication Mobility status        Time: 9935-7017 OT Time Calculation (min): 25 min  Charges: OT General Charges $OT Visit: 1 Visit OT Treatments $Self Care/Home Management : 8-22 mins  Lynnda Child, OTD, OTR/L Acute Rehab [phone]) [phone]   Kaylyn Lim 04/27/2022, 12:55  PM

## 2022-04-27 NOTE — Progress Notes (Signed)
Patient ID: Shelley Mitchell, female   DOB: 1941/03/09, 81 y.o.   MRN: [id number] BP (!) 150/64   Pulse 68   Temp 98 F (36.7 C)   Resp 18   Ht '5\' 1"'$  (1.549 m)   Wt 111.1 kg   SpO2 95%   BMI 46.29 kg/m  OR for Thoracic decompression T11/12 Moving lower extremities Alert and oriented x 4 =

## 2022-04-27 NOTE — Transfer of Care (Signed)
Immediate Anesthesia Transfer of Care Note  Patient: SHAWNTA SCHLEGEL  Procedure(s) Performed: Thoracic Eleven-Twelve Decompression (Spine Thoracic)  Patient Location: PACU  Anesthesia Type:General  Level of Consciousness: awake, alert  and oriented  Airway & Oxygen Therapy: Patient Spontanous Breathing and Patient connected to face mask oxygen  Post-op Assessment: Report given to RN and Post -op Vital signs reviewed and stable  Post vital signs: Reviewed and stable  Last Vitals:  Vitals Value Taken Time  BP 131/66 04/27/22 2015  Temp    Pulse 75 04/27/22 2020  Resp 18 04/27/22 2020  SpO2 98 % 04/27/22 2020  Vitals shown include unvalidated device data.  Last Pain:  Vitals:   04/27/22 1553  TempSrc:   PainSc: 7       Patients Stated Pain Goal: 0 (40/33/53 3174)  Complications: No notable events documented.

## 2022-04-27 NOTE — Care Management Important Message (Signed)
Important Message  Patient Details  Name: Shelley Mitchell MRN: [id number] Date of Birth: 1941-05-14   Medicare Important Message Given:  Yes     Kamori Barbier Montine Circle 04/27/2022, 11:06 AM

## 2022-04-27 NOTE — TOC Progression Note (Signed)
Transition of Care [facility]) - Progression Note    Patient Details  Name: Shelley Mitchell MRN: [id number] Date of Birth: 06-Feb-1941  [facility] of Care [facility]) CM/SW [place], LCSW Phone Number: 04/27/2022, 12:19 PM  Clinical Narrative:    CSW received request from RN to contact patient's son, Shelley Mitchell [phone]). CSW left voicemail.    Expected Discharge Plan: Skilled Nursing Facility Barriers to Discharge: Ship broker, Continued Medical Work up, SNF Pending bed offer  Expected Discharge Plan and Services Expected Discharge Plan: [place] In-house Referral: Clinical Social Work   Post Acute Care Choice: [place] Living arrangements for the past 2 months: Single Family Home                                       Social Determinants of Health (SDOH) Interventions    Readmission Risk Interventions     View : No data to display.

## 2022-04-27 NOTE — Progress Notes (Signed)
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 PMH includes hyperlipidemia, anxiety, obesity, OA, and tachycardia.    PT Comments    Patient seen for co-tx with OT to maximize ability to transfer/stand. Patient moving bil LEs better than on previous visit and is scheduled for surgery this p.m. Patient with improved sitting balance (including able to do bimanual ADL tasks while in sitting with min guard assist and min cues to right herself as she fatigued. Able to stand twice with +2 max assist with use of belt and bed pad under pelvis. Stood for 15-20 seconds each time, but did achieve full upright posture. Will reassess/re-evaluate on next visit (due to surgery) .    Recommendations for follow up therapy are one component of a multi-disciplinary discharge planning process, led by the attending physician.  Recommendations may be updated based on patient status, additional functional criteria and insurance authorization.  Follow Up Recommendations  Skilled nursing-short term rehab (<3 hours/day)     Assistance Recommended at Discharge Frequent or constant Supervision/Assistance  Patient can return home with the following Two people to help with walking and/or transfers;Two people to help with bathing/dressing/bathroom;Assistance with cooking/housework;Help with stairs or ramp for entrance;Assist for transportation   Equipment Recommendations  None recommended by PT    Recommendations for Other Services       Precautions / Restrictions Precautions Precautions: Fall Restrictions Weight Bearing Restrictions: No     Mobility  Bed Mobility Overal bed  mobility: Needs Assistance Bed Mobility: Rolling, Sidelying to Sit, Sit to Sidelying Rolling: Max assist, +2 for physical assistance Sidelying to sit: Max assist, +2 for physical assistance, HOB elevated (legs over EOB with HOB flat and then elevated HOB to assist with come to sitting due to pt's inability to use Rt shoulder/UE)     Sit to sidelying: Max assist, +2 for physical assistance General bed mobility comments: educated on rolling to side and side to sit in prep for post-op precautions. Unfortunately room set up to roll to her right with Rt shoulder being painful and unable to use shoulder (needs shoulder replacement); assist with legs off and back on bed    Transfers Overall transfer level: Needs assistance Equipment used: 2 person hand held assist Transfers: Sit to/from Stand Sit to Stand: Max assist, +2 physical assistance           General transfer comment: using gait belt and pad under pelvis with knees blocked; able to achieve full upright standing without buckling but ?knees locked in hyperextension; on attempts to advance LLE, knee began to buckle    Ambulation/Gait               General Gait Details: unable   Stairs             Wheelchair Mobility    Modified Rankin (Stroke Patients Only)       Balance Overall balance assessment: Needs assistance Sitting-balance support: Feet supported Sitting balance-Leahy Scale: Poor Sitting balance - Comments: requires mingurard with tendency to lean to her left as she fatigues; some posterior lean as well   Standing balance support: During functional activity, Reliant on assistive device for balance, Bilateral upper extremity supported  Standing balance-Leahy Scale: Zero Standing balance comment: with +2 max assist                            Cognition Arousal/Alertness: Awake/alert Behavior During Therapy: WFL for tasks assessed/performed, Flat affect Overall Cognitive Status: Within  Functional Limits for tasks assessed                                          Exercises General Exercises - Lower Extremity Ankle Circles/Pumps: AROM, AAROM, Both, 5 reps    General Comments General comments (skin integrity, edema, etc.): See OT note for ADLs at EOB. Pt did need verbal and occasionally tactile cues to regain her balance while performing ADLs as tendency to lean either posterior or to her left      Pertinent Vitals/Pain Pain Assessment Pain Assessment: Faces Faces Pain Scale: Hurts even more Pain Location: rt shoulder (needs replacement) Pain Descriptors / Indicators: Discomfort, Grimacing, Guarding Pain Intervention(s): Limited activity within patient's tolerance, Monitored during session, Repositioned    Home Living                          Prior Function            PT Goals (current goals can now be found in the care plan section) Acute Rehab PT Goals Patient Stated Goal: to get stronger PT Goal Formulation: With patient/family Time For Goal Achievement: 05/09/22 Potential to Achieve Goals: Good Progress towards PT goals: Progressing toward goals    Frequency    Min 2X/week      PT Plan Current plan remains appropriate (continue to assess post-op)    Co-evaluation PT/OT/SLP Co-Evaluation/Treatment: Yes Reason for Co-Treatment: Complexity of the patient's impairments (multi-system involvement);For patient/therapist safety;To address functional/ADL transfers PT goals addressed during session: Mobility/safety with mobility;Balance;Strengthening/ROM        AM-PAC PT "6 Clicks" Mobility   Outcome Measure  Help needed turning from your back to your side while in a flat bed without using bedrails?: Total Help needed moving from lying on your back to sitting on the side of a flat bed without using bedrails?: Total Help needed moving to and from a bed to a chair (including a wheelchair)?: Total Help needed standing up  from a chair using your arms (e.g., wheelchair or bedside chair)?: Total Help needed to walk in hospital room?: Total Help needed climbing 3-5 steps with a railing? : Total 6 Click Score: 6    End of Session Equipment Utilized During Treatment: Gait belt Activity Tolerance: Patient tolerated treatment well Patient left: in bed;with call bell/phone within reach;with bed alarm set;with family/visitor present Nurse Communication: Mobility status PT Visit Diagnosis: Unsteadiness on feet (R26.81);Muscle weakness (generalized) (M62.81);Difficulty in walking, not elsewhere classified (R26.2)     Time: 5038-8828 PT Time Calculation (min) (ACUTE ONLY): 25 min  Charges:  $Therapeutic Activity: 8-22 mins                      Arby Barrette, PT Acute Rehabilitation Services  Pager [phone] Office [phone]    Rexanne Mano 04/27/2022, 12:02 PM

## 2022-04-27 NOTE — Progress Notes (Signed)
PROGRESS NOTE                                                                                                                                                                                                             Patient Demographics:    Shelley Mitchell, is a 81 y.o. female, DOB - [birth year], MAU:[id number]  Outpatient Primary MD for the patient is [name], MD    LOS - 4  Admit date - 04/23/2022    Chief Complaint  Patient presents with   Extremity Weakness       Brief Narrative (HPI from H&P)   81 year old female with a history of hypertension, hyperlipidemia, anxiety, chronic back pain with some chronic lower extremity weakness who presented to [facility] with acute on chronic worsening of her left lower extremity weakness that began in the early morning 04/23/2022.  MRI of her L-spine showed changes suggestive of canal stenosis at T11-T12 level.  She was subsequently sent to [name] for neurosurgical evaluation.   Subjective:   Patient in bed, appears comfortable, denies any headache, no fever, no chest pain or pressure, no shortness of breath , no abdominal pain. No new focal weakness. Bilateral L>R leg weakness.   Assessment  & Plan :    Chronic low back pain with acute on chronic lower extremity weakness left more than right.  She currently does not have signs of cauda equina, good sphincter control, symptoms are acute on chronic, at rest her back pain is stable, CT scan of the brain is unremarkable.  MRI of the L-spine and follow-up T-spine noted.  Neurosurgeon Dr. Christella Noa following,  ESR mildly elevated but stable CRP, negative procalcitonin.  No fever with mild reactive leukocytosis.  Per neurosurgery she will undergo surgical correction on 04/27/2022.  Patient and family agreeable for blood transfusions if required.  Anxiety - Continue Zoloft  Hyperlipidemia - Continue statin  Essential  hypertension - Continue nifedipine, hydralazine, as needed IV hydralazine during perioperative period.   Chronic trace edema.  Low-dose lasix x1 given on 04/24/2022, continue with TED stockings.  Hyponatremia.  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 on 04/27/2022 for surgery gentle LR, check urine osmolality along with urine electrolytes and osmolality.        Condition - Fair  Family Communication  :  Son [name] in detail over  the phone on 04/24/2022, explained that patient will be moderate risk for adverse cardiopulmonary outcome if she goes for surgical correction.  He accepts and understands the risk  Sons [name] and [place] bedside 04/25/22  Son lance bedside 04/27/22   Code Status :  Full  Consults  :  N. Surgery  PUD Prophylaxis :    Procedures  :     MRI T Spine - 1. 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 Associated severe bilateral T11 foraminal stenosis. 2. Widespread advanced thoracic spine degeneration elsewhere. Additional mild spinal stenosis at T2-T3, T7-T8, and T12-L1. Up to mild mass effect on the conus at the latter, but no additional thoracic cord signal abnormality identified. 3. Widespread moderate or severe degenerative neural foraminal stenosis at multiple other thoracic levels. 4. 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 No acute osseous abnormality.  CT head - Non acute  MRI L Spine -  1. 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 2. Multilevel bilateral foraminal stenosis, severe at T11-T12, L3-4, and L5-S1. 3. Severe bilateral facet arthropathy of L5-S1 with associated facet joint effusions, likely reactive. 4. Small amount  of fluid within the L5-S1 disc space is favored degenerative. No secondary bony or soft tissue changes to suggest discitis. Correlate with serum inflammatory markers if there is clinical suspicion for infection.      Disposition Plan  :    Status is: Inpatient  DVT Prophylaxis  :    Place TED hose Start: 04/24/22 1223 enoxaparin (LOVENOX) injection 40 mg Start: 04/23/22 2230   Lab Results  Component Value Date   PLT 215 04/27/2022    Diet :  Diet Order             Diet NPO time specified Except for: Sips with Meds  Diet effective now                    Inpatient Medications  Scheduled Meds:  enoxaparin (LOVENOX) injection  40 mg Subcutaneous Q24H   hydrALAZINE  25 mg Oral TID   latanoprost  1 drop Both Eyes QHS   multivitamin with minerals  1 tablet Oral Daily   NIFEdipine  90 mg Oral Daily   pravastatin  40 mg Oral Daily   sertraline  100 mg Oral Daily   Continuous Infusions:  lactated ringers     PRN Meds:.acetaminophen **OR** acetaminophen, diclofenac Sodium, metoprolol tartrate, ondansetron **OR** ondansetron (ZOFRAN) IV, traMADol  Antibiotics  :    Anti-infectives (From admission, onward)    None        Time Spent in minutes  30   Lala Lund M.D on 04/27/2022 at 9:25 AM  To page go to [email or web address]   [facility] -  Office  [phone]  See all Orders from today for further details    Objective:   Vitals:   04/26/22 1550 04/26/22 2036 04/27/22 0104 04/27/22 0804  BP: (!) 149/73 135/66 138/65 139/75  Pulse: 73 68 69 68  Resp: _0 Temp: 98.8 F (37.1 C) 99.7 F (37.6 C) 99.5 F (37.5 C) 98.3 F (36.8 C)  TempSrc: Oral Oral Oral Oral  SpO2: 98% 98% 97% 97%  Weight:      Height:  Wt Readings from Last 3 Encounters:  04/23/22 111.1 kg  03/08/22 109.3 kg  07/04/21 108 kg     Intake/Output Summary (Last 24 hours) at 04/27/2022 0925 Last data filed at 04/26/2022 2040 Gross per 24 hour  Intake --   Output 200 ml  Net -200 ml     Physical Exam  Awake Alert, No new F.N deficits, mild weakness in both lower extremities left more than right  [place].AT,PERRAL Supple Neck, No JVD,   Symmetrical Chest wall movement, Good air movement bilaterally, CTAB RRR,No Gallops, Rubs or new Murmurs,  +ve B.Sounds, Abd Soft, No tenderness,   No Cyanosis, Clubbing or edema         Data Review:    CBC Recent Labs  Lab 04/23/22 1157 04/25/22 0133 04/26/22 0205 04/27/22 0229  WBC 6.0 8.7 9.3 12.1*  HGB 10.9* 11.2* 11.1* 10.4*  HCT 34.0* 34.3* 33.6* 30.1*  PLT 217 215 185 215  MCV 87.9 87.3 87.5 84.3  MCH 28.2 28.5 28.9 29.1  MCHC 32.1 32.7 33.0 34.6  RDW 15.2 14.8 14.8 14.5  LYMPHSABS 1.1 1.4 1.3 0.9  MONOABS 0.7 1.1* 0.9 1.3*  EOSABS 0.1 0.0 0.0 0.0  BASOSABS 0.0 0.0 0.0 0.0    Electrolytes Recent Labs  Lab 04/23/22 1157 04/23/22 2238 04/24/22 0341 04/24/22 0631 04/24/22 1217 04/25/22 0133 04/26/22 0205 04/27/22 0229  NA 138  --  135  --   --  136 135 131*  K 3.9  --  3.8  --   --  4.0 3.9 3.7  CL 106  --  103  --   --  104 104 102  CO2 25  --  24  --   --  _0 GLUCOSE 107*  --  111*  --   --  109* 124* 128*  BUN 19  --  14  --   --  _1 CREATININE 0.67  --  0.69  --   --  0.76 0.69 0.70  CALCIUM 9.5  --  9.3  --   --  9.5 9.2 9.2  AST  --   --   --   --   --  12* 14* 11*  ALT  --   --   --   --   --  _2 ALKPHOS  --   --   --   --   --  52 44 43  BILITOT  --   --   --   --   --  0.5 0.7 0.5  ALBUMIN  --   --   --   --   --  3.5 3.2* 3.0*  MG  --  2.3  --   --   --  2.0 1.9 2.1  CRP  --   --   --   --  0.6  --   --   --   PROCALCITON  --   --   --   --  <0.10 <0.10 <0.10  --   INR  --   --   --  1.0  --   --   --   --   HGBA1C 5.6  --   --   --   --   --   --   --     ------------------------------------------------------------------------------------------------------------------ No results for input(s): CHOL, HDL, LDLCALC, TRIG, CHOLHDL,  LDLDIRECT in the last 72 hours.   Radiology Reports CT Head Wo Contrast  Result Date: 04/23/2022 CLINICAL DATA:  Weakness, fall. EXAM: CT HEAD WITHOUT CONTRAST TECHNIQUE: Contiguous axial images were obtained from the base of the skull through the vertex without intravenous contrast. RADIATION DOSE REDUCTION: This exam was performed according to the departmental dose-optimization program which includes automated exposure control, adjustment of the mA and/or kV according to patient size and/or use of iterative reconstruction technique. COMPARISON:  July 04, 2021. FINDINGS: Brain: Mild chronic ischemic white matter disease is noted. No mass effect or midline shift is noted. Ventricular size is within normal limits. There is no evidence of mass lesion, hemorrhage or acute infarction. Vascular: No hyperdense vessel or unexpected calcification. Skull: Normal. Negative for fracture or focal lesion. Sinuses/Orbits: No acute finding. Other: None. IMPRESSION: No acute intracranial abnormality seen. Electronically Signed   By: Marijo Conception M.D.   On: 04/23/2022 12:32   CT Lumbar Spine Wo Contrast  Result Date: 04/23/2022 CLINICAL DATA:  Fall, lower back pain EXAM: CT LUMBAR SPINE WITHOUT CONTRAST TECHNIQUE: Multidetector CT imaging of the lumbar spine was performed without intravenous contrast administration. Multiplanar CT image reconstructions were also generated. RADIATION DOSE REDUCTION: This exam was performed according to the departmental dose-optimization program which includes automated exposure control, adjustment of the mA and/or kV according to patient size and/or use of iterative reconstruction technique. COMPARISON:  None Available. FINDINGS: Segmentation: Standard; the lowest formed disc space is designated L5-S1. Alignment: Normal. There is no antero or retrolisthesis. There is no jumped or perched facets or other evidence of traumatic malalignment. Vertebrae: Vertebral body heights are preserved.  There is no acute fracture. There is no suspicious osseous lesion. The bones are diffusely demineralized. Paraspinal and other soft tissues: There is extensive calcified atherosclerotic plaque throughout the nonaneurysmal abdominal aorta. There is thickening of the bilateral adrenal glands without discrete nodule. Are postsurgical changes in the posterior soft tissues from L2 through L5. Disc levels: There is advanced multilevel disc space narrowing throughout the lumbar spine with multilevel vacuum disc phenomenon, most notable at L5-S1. There is advanced multilevel facet arthropathy, most severe in the lower lumbar spine. There is multilevel degenerative endplate change with bulky anterior osteophytes. T12-L1: Is a disc bulge, degenerative endplate change, and bilateral facet arthropathy resulting in mild-to-moderate spinal canal stenosis and moderate right and mild left neural foraminal stenosis L1-L2: There is a disc bulge, degenerative endplate change, and bilateral facet arthropathy resulting in mild-to-moderate spinal canal stenosis and moderate bilateral neural foraminal stenosis L2-L3: Status post posterior decompression. There is a disc bulge, degenerative endplate change, and bilateral facet arthropathy resulting in mild-to-moderate bilateral neural foraminal stenosis without significant spinal canal stenosis L3-L4: Status post posterior decompression. There is a disc bulge, degenerative endplate change, and bilateral facet arthropathy resulting in moderate left and mild right neural foraminal stenosis without significant spinal canal stenosis L4-L5: Status post posterior decompression. There is a disc bulge, degenerative endplate change, and severe bilateral facet arthropathy resulting in moderate to severe right and mild-to-moderate left neural foraminal stenosis without significant spinal canal stenosis L5-S1: Status post posterior decompression. There is a disc bulge, degenerative endplate change, and  severe bilateral facet arthropathy resulting in severe bilateral neural foraminal stenosis without significant spinal canal stenosis. IMPRESSION: 1. No acute fracture or traumatic malalignment of the lumbar spine. 2. Postsurgical changes reflecting posterior decompression from L2-L3 through L5-S1. There is no definite high-grade spinal canal stenosis, but there is multilevel advanced multilevel degenerative change with bilateral neural foraminal stenosis, most advanced at L5-S1. 3.  Aortic Atherosclerosis (ICD10-I70.0).  Electronically Signed   By: Valetta Mole M.D.   On: 04/23/2022 12:44   MR BRAIN WO CONTRAST  Result Date: 04/23/2022 CLINICAL DATA:  Neuro deficit, acute, stroke suspected; left leg weak EXAM: MRI HEAD WITHOUT CONTRAST TECHNIQUE: Multiplanar, multiecho pulse sequences of the brain and surrounding structures were obtained without intravenous contrast. COMPARISON:  None Available. FINDINGS: Brain: There is no acute infarction or intracranial hemorrhage. There is no intracranial mass, mass effect, or edema. There is no hydrocephalus or extra-axial fluid collection. Prominence of the ventricles and sulci reflects generalized parenchymal volume loss. Patchy and confluent areas of T2 hyperintensity in the supratentorial and pontine white matter are nonspecific may reflect moderate chronic microvascular ischemic changes. Vascular: Major vessel flow voids at the skull base are preserved. Skull and upper cervical spine: Normal marrow signal is preserved. Sinuses/Orbits: Paranasal sinuses are aerated. Orbits are unremarkable. Other: Expanded, "empty" sella.  Mastoid air cells are clear. IMPRESSION: No acute infarction, hemorrhage, or mass. Moderate chronic microvascular ischemic changes. Electronically Signed   By: Macy Mis M.D.   On: 04/23/2022 16:33   MR THORACIC SPINE WO CONTRAST  Addendum Date: 04/25/2022   ADDENDUM REPORT: 04/25/2022 11:46 ADDENDUM: Study discussed by telephone with the  patient's nurse Olayinka on 04/25/2022 at 1135 hours. Electronically Signed   By: Genevie Ann M.D.   On: 04/25/2022 11:46   Result Date: 04/25/2022 CLINICAL DATA:  81 year old female with probable lower thoracic spinal cord stenosis and cord signal abnormality on lumbar MRI 2 days ago. EXAM: MRI THORACIC SPINE WITHOUT CONTRAST TECHNIQUE: Multiplanar, multisequence MR imaging of the thoracic spine was performed. No intravenous contrast was administered. COMPARISON:  Lumbar MRI 04/23/2022. FINDINGS: Limited cervical spine imaging: Widespread cervical spine degeneration with evidence of some spondylolisthesis at C5. Advanced cervical disc space loss, endplate spurring. But only mild cervical spinal stenosis suspected. Thoracic spine segmentation:  Appears to be normal. Alignment: Mild dextroconvex thoracic scoliosis. Relatively preserved thoracic kyphosis. Subtle degenerative appearing anterolisthesis at both T1-T2 and T2-T3. Vertebrae: Underlying evidence of T3-T4 interbody ankylosis. Widespread chronic degenerative thoracic endplate marrow signal changes. Background bone marrow signal within normal limits. No convincing marrow edema or acute osseous abnormality. Cord: Thoracic spinal cord signal and morphology remain normal above T11. But there is degenerative cord compression with abnormal cord signal confirmed at T11-T12 (series 17, image 36). Conus medullaris below that appears stable from the recent lumbar MRI, terminating just below T12-L1. Paraspinal and other soft tissues: Negative visible chest and upper abdominal viscera aside from probable tortuosity of the thoracic aorta. Thoracic paraspinal soft tissues remain within normal limits. Disc levels: T1-T2: Mild anterolisthesis with disc bulging. Up to moderate facet and ligament flavum hypertrophy. No spinal stenosis. Moderate to severe bilateral T1 foraminal stenosis. T2-T3: Similar anterolisthesis. Disc space loss and disc bulging eccentric to the right.  Moderate facet and ligament flavum hypertrophy. Borderline to mild spinal stenosis (series 17, image 8), no convincing cord mass effect. Moderate right T2 foraminal stenosis. T3-T4: Interbody ankylosis. Posterior element hypertrophy. No spinal stenosis. Moderate bilateral T3 foraminal stenosis. T4-T5: Circumferential disc bulge with endplate spurring and moderate to severe facet and ligament flavum hypertrophy. No spinal stenosis. Moderate to severe left greater than right T4 foraminal stenosis. T5-T6: Advanced disc space loss. Bulky circumferential disc osteophyte complex. Moderate facet and ligament flavum hypertrophy. But no significant spinal stenosis. Moderate to severe left and mild right T5 foraminal stenosis. T6-T7: Disc space loss with bulky circumferential disc osteophyte complex eccentric to the left. Moderate facet and  ligament flavum hypertrophy. No spinal stenosis. Severe left T6 foraminal stenosis. T7-T8: Similar disc space loss. Bulky circumferential disc osteophyte complex. Severe facet and ligament flavum hypertrophy. Degenerative facet joint fluid. Mild spinal stenosis (series 17, image 23). No cord mass effect. Severe left and mild to moderate right T7 foraminal stenosis. T8-T9: Circumferential disc osteophyte complex eccentric to the left. Moderate to severe facet and ligament flavum hypertrophy. Borderline spinal stenosis. Severe left and mild to moderate right T8 foraminal stenosis. T9-T10: Disc space loss with circumferential disc osteophyte complex. Mild to moderate facet and ligament flavum hypertrophy. No spinal stenosis. Severe left and mild-to-moderate right T9 foraminal stenosis. T10-T11: Circumferential disc osteophyte complex with mild to moderate facet and ligament flavum hypertrophy. No significant spinal stenosis. Moderate to severe bilateral T10 foraminal stenosis. T11-T12: Disc space loss with bulky circumferential disc osteophyte complex and severe facet and ligament flavum  hypertrophy. Degenerative facet joint fluid. Moderate to severe spinal stenosis with cord mass effect. Abnormal cord signal here more resembles myelomalacia than spinal cord edema (just below the disc space level series 17, image 37). Severe bilateral T11 foraminal stenosis. T12-L1: Disc space loss with bulky circumferential disc osteophyte complex. Moderate facet and ligament flavum hypertrophy. Mild spinal stenosis with up to mild mass effect here at the conus. No conus signal abnormality identified. Mild to moderate left and moderate to severe right T12 foraminal stenosis. IMPRESSION: 1. 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 Associated severe bilateral T11 foraminal stenosis. 2. Widespread advanced thoracic spine degeneration elsewhere. Additional mild spinal stenosis at T2-T3, T7-T8, and T12-L1. Up to mild mass effect on the conus at the latter, but no additional thoracic cord signal abnormality identified. 3. Widespread moderate or severe degenerative neural foraminal stenosis at multiple other thoracic levels. 4. 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 No acute osseous abnormality. Electronically Signed: By: Genevie Ann M.D. On: 04/25/2022 11:30   MR LUMBAR SPINE WO CONTRAST  Result Date: 04/23/2022 CLINICAL DATA:  Low back pain, prior surgery, new symptoms left leg weakness EXAM: MRI LUMBAR SPINE WITHOUT CONTRAST TECHNIQUE: Multiplanar, multisequence MR imaging of the lumbar spine was performed. No intravenous contrast was administered. COMPARISON:  CT 04/23/2022 FINDINGS: Segmentation:  Standard. Alignment:  Grade 1 anterolisthesis of L4 on L5. Vertebrae: No acute fracture. No suspicious bone lesion. Bilateral facet joint effusions at L5-S1. Small amount of fluid within the L5-S1 disc space without associated endplate marrow edema. Vacuum disc phenomenon is present  at this level. No paravertebral inflammatory changes or other evidence to suggest discitis. Conus medullaris and cauda equina: Conus extends to the L1 level. Increased T2/STIR signal within the lower thoracic cord at the T11-12 level. Conus and cauda equina appear normal. Paraspinal and other soft tissues: Posterior paraspinal muscle atrophy. Postsurgical changes present at the midline of the lumbar spine. Thin postoperative seroma along the incision site superficially. Disc levels: T11-T12: Sagittal sequences only. Diffuse disc bulge and posterior element hypertrophy resulting in severe canal stenosis with severe bilateral foraminal stenosis. T12-L1: Disc bulge and endplate spurring. Bilateral facet arthropathy and ligamentum flavum buckling. Moderate canal stenosis with severe right and mild left foraminal stenosis. L1-L2: Mild disc bulge and endplate spurring. Mild bilateral facet arthropathy. Mild canal stenosis with moderate bilateral foraminal stenosis, right worse than left. L2-L3: Prior posterior decompression. Disc bulge and endplate spurring. Bilateral facet arthropathy. No canal stenosis. Mild bilateral foraminal stenosis. L3-L4: Prior posterior decompression. Disc bulge  and endplate spurring. Bilateral facet arthropathy. No canal stenosis. Severe left and mild right foraminal stenosis. L4-L5: Prior posterior decompression. Diffuse endplate spurring and moderate bilateral facet arthropathy. No canal stenosis. Mild-moderate right foraminal stenosis. L5-S1: Prior posterior decompression. Diffuse endplate spurring with severe bilateral facet arthropathy. Severe bilateral subarticular recess stenosis without canal stenosis. Severe bilateral foraminal stenosis. IMPRESSION: 1. 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 Increased signal within the lower thoracic cord at this level favored to reflect  chronic myelomalacia. 2. Multilevel bilateral foraminal stenosis, severe at T11-T12, L3-4, and L5-S1. 3. Severe bilateral facet arthropathy of L5-S1 with associated facet joint effusions, likely reactive. 4. Small amount of fluid within the L5-S1 disc space is favored degenerative. No secondary bony or soft tissue changes to suggest discitis. Correlate with serum inflammatory markers if there is clinical suspicion for infection. Electronically Signed   By: Davina Poke D.O.   On: 04/23/2022 16:37

## 2022-04-27 NOTE — Anesthesia Preprocedure Evaluation (Signed)
Anesthesia Evaluation  Patient identified by MRN, date of birth, ID band Patient awake    Reviewed: Allergy & Precautions, H&P , NPO status , Patient's Chart, lab work & pertinent test results  Airway Mallampati: II   Neck ROM: full    Dental   Pulmonary sleep apnea ,    breath sounds clear to auscultation       Cardiovascular hypertension,  Rhythm:regular Rate:Normal     Neuro/Psych PSYCHIATRIC DISORDERS Anxiety    GI/Hepatic   Endo/Other  Morbid obesity  Renal/GU      Musculoskeletal  (+) Arthritis ,   Abdominal   Peds  Hematology   Anesthesia Other Findings   Reproductive/Obstetrics                             Anesthesia Physical Anesthesia Plan  ASA: 2  Anesthesia Plan: General   Post-op Pain Management:    Induction: Intravenous  PONV Risk Score and Plan: 3 and Ondansetron, Dexamethasone and Treatment may vary due to age or medical condition  Airway Management Planned: Oral ETT  Additional Equipment:   Intra-op Plan:   Post-operative Plan: Extubation in OR  Informed Consent: I have reviewed the patients History and Physical, chart, labs and discussed the procedure including the risks, benefits and alternatives for the proposed anesthesia with the patient or authorized representative who has indicated his/her understanding and acceptance.     Dental advisory given  Plan Discussed with: CRNA, Anesthesiologist and Surgeon  Anesthesia Plan Comments:         Anesthesia Quick Evaluation

## 2022-04-27 NOTE — Anesthesia Procedure Notes (Signed)
Procedure Name: Intubation Date/Time: 04/27/2022 5:47 PM Performed by: Alain Marion, CRNA Pre-anesthesia Checklist: Patient identified, Emergency Drugs available, Suction available and Patient being monitored Patient Re-evaluated:Patient Re-evaluated prior to induction Oxygen Delivery Method: Circle System Utilized Preoxygenation: Pre-oxygenation with 100% oxygen Induction Type: IV induction Ventilation: Mask ventilation without difficulty Laryngoscope Size: Miller and 2 Grade View: Grade I Tube type: Oral Tube size: 7.0 mm Number of attempts: 1 Airway Equipment and Method: Stylet and Oral airway Placement Confirmation: ETT inserted through vocal cords under direct vision, positive ETCO2 and breath sounds checked- equal and bilateral Secured at: 21 cm Tube secured with: Tape Dental Injury: Teeth and Oropharynx as per pre-operative assessment

## 2022-04-27 NOTE — Progress Notes (Signed)
Received report from [facility] in PACU. Pt awake, alert, and oriented x4. Procedure T11-12 Decompression. CDI Dermabond with [place]comb drsg to mid back. + Neurovasc assessment. Has F/C with 400 ml output. Received po liquid Oxycodone 5 ml for pain (surgical) and hot pack to the back of neck for pain. Received 1 Liter LR, 200 mcg IV Fentanyl, and '5mg'$  of IV Decadron during surgery. Continues to have a 20 g IV in RAC and a 20 g IV in [place]. Last set of VS are in the flowsheets. Will give this report to pt's nurse [facility] RN.

## 2022-04-27 NOTE — Op Note (Signed)
04/27/2022  8:35 PM  PATIENT:  Shelley Mitchell  81 y.o. female With lower extremity weakness, and cord compression. Taken to the operating room for thoracic decompression.  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 She  was positioned prone on a wilson frame with all pressure points properly padded. She was prepped and draped in a sterile manner.  With fluoroscopy I localized the T11-12 space. I infiltrated lidocaine in the planned incision. I opened the skin with a 10 blade and dissected to the thoracolumbar fascia. I exposed the T11, and T12 lamina and confirmed my location with fluoroscopy.  I performed laminectomies of T11 and 12 with the drill and Kerrison punches.I removed the ligamentum flavum with the Kerrison punches. I irrigated. I used fluoro again to confirm my location. I once more felt the decompression was at the correct levels.  I infiltrated marcaine into the paraspinous musculature. I closed the wound by approximating the thoracolumbar fascia, subcutaneous, and subcuticular planes with vicryl sutures.  I applied a sterile dressing, dermabond and and occlusive bandage.  PLAN OF CARE: Admit to inpatient   PATIENT DISPOSITION:  PACU - hemodynamically stable.   Delay start of Pharmacological VTE agent (>24hrs) due to surgical blood loss or risk of bleeding:  no

## 2022-04-28 ENCOUNTER — Encounter (HOSPITAL_COMMUNITY): Payer: Self-pay | Admitting: Neurosurgery

## 2022-04-28 DIAGNOSIS — E783 Hyperchylomicronemia: Secondary | ICD-10-CM | POA: Diagnosis not present

## 2022-04-28 LAB — CBC WITH DIFFERENTIAL/PLATELET
Abs Immature Granulocytes: 0.08 10*3/uL — ABNORMAL HIGH (ref 0.00–0.07)
Basophils Absolute: 0 10*3/uL (ref 0.0–0.1)
Basophils Relative: 0 %
Eosinophils Absolute: 0 10*3/uL (ref 0.0–0.5)
Eosinophils Relative: 0 %
HCT: 28.8 % — ABNORMAL LOW (ref 36.0–46.0)
Hemoglobin: 9.8 g/dL — ABNORMAL LOW (ref 12.0–15.0)
Immature Granulocytes: 1 %
Lymphocytes Relative: 2 %
Lymphs Abs: 0.3 10*3/uL — ABNORMAL LOW (ref 0.7–4.0)
MCH: 29.3 pg (ref 26.0–34.0)
MCHC: 34 g/dL (ref 30.0–36.0)
MCV: 86.2 fL (ref 80.0–100.0)
Monocytes Absolute: 0.7 10*3/uL (ref 0.1–1.0)
Monocytes Relative: 6 %
Neutro Abs: 11.8 10*3/uL — ABNORMAL HIGH (ref 1.7–7.7)
Neutrophils Relative %: 91 %
Platelets: 227 10*3/uL (ref 150–400)
RBC: 3.34 MIL/uL — ABNORMAL LOW (ref 3.87–5.11)
RDW: 14.4 % (ref 11.5–15.5)
WBC: 12.8 10*3/uL — ABNORMAL HIGH (ref 4.0–10.5)
nRBC: 0 % (ref 0.0–0.2)

## 2022-04-28 LAB — COMPREHENSIVE METABOLIC PANEL
ALT: 38 U/L (ref 0–44)
AST: 63 U/L — ABNORMAL HIGH (ref 15–41)
Albumin: 2.6 g/dL — ABNORMAL LOW (ref 3.5–5.0)
Alkaline Phosphatase: 71 U/L (ref 38–126)
Anion gap: 9 (ref 5–15)
BUN: 19 mg/dL (ref 8–23)
CO2: 22 mmol/L (ref 22–32)
Calcium: 9 mg/dL (ref 8.9–10.3)
Chloride: 102 mmol/L (ref 98–111)
Creatinine, Ser: 0.65 mg/dL (ref 0.44–1.00)
GFR, Estimated: >60 mL/min (ref >60)
Glucose, Bld: 144 mg/dL — ABNORMAL HIGH (ref 70–99)
Potassium: 4.8 mmol/L (ref 3.5–5.1)
Sodium: 133 mmol/L — ABNORMAL LOW (ref 135–145)
Total Bilirubin: 0.7 mg/dL (ref 0.3–1.2)
Total Protein: 6.2 g/dL — ABNORMAL LOW (ref 6.5–8.1)

## 2022-04-28 LAB — UREA NITROGEN, URINE: Urea Nitrogen, Ur: 1356 mg/dL

## 2022-04-28 LAB — MAGNESIUM: Magnesium: 2.4 mg/dL (ref 1.7–2.4)

## 2022-04-28 MED ORDER — DIAZEPAM 2 MG PO TABS
2.0000 mg | ORAL_TABLET | Freq: Four times a day (QID) | ORAL | Status: DC | PRN
Start: 2022-04-28 — End: 2022-04-30

## 2022-04-28 MED ORDER — MORPHINE SULFATE (PF) 2 MG/ML IV SOLN: 2.0000 mg | INTRAVENOUS | Status: DC | PRN

## 2022-04-28 MED ORDER — CHLORHEXIDINE GLUCONATE CLOTH 2 % EX PADS
6.0000 | MEDICATED_PAD | Freq: Every day | CUTANEOUS | Status: DC
  Administered 2022-04-28 – 2022-04-30 (×3): 6 via TOPICAL

## 2022-04-28 MED ORDER — DIAZEPAM 5 MG PO TABS: 5.0000 mg | ORAL_TABLET | Freq: Four times a day (QID) | ORAL | Status: DC | PRN

## 2022-04-28 MED ORDER — LACTATED RINGERS IV SOLN: INTRAVENOUS | Status: AC

## 2022-04-28 MED ORDER — OXYCODONE HCL 5 MG PO TABS
5.0000 mg | ORAL_TABLET | ORAL | Status: DC | PRN
  Administered 2022-04-28: 5 mg via ORAL
  Administered 2022-04-28 – 2022-04-29 (×3): 10 mg via ORAL
  Filled 2022-04-28: qty 2
  Filled 2022-04-28: qty 1
  Filled 2022-04-28 (×2): qty 2

## 2022-04-28 NOTE — Progress Notes (Signed)
PROGRESS NOTE                                                                                                                                                                                                             Patient Demographics:    Shelley Mitchell, is a 81 y.o. female, DOB - February 27, 1941, IHK:[id number]  Outpatient Primary MD for the patient is [name], MD    LOS - 5  Admit date - 04/23/2022    Chief Complaint  Patient presents with   Extremity Weakness       Brief Narrative (HPI from H&P)   81 year old female with a history of hypertension, hyperlipidemia, anxiety, chronic back pain with some chronic lower extremity weakness who presented to [facility] with acute on chronic worsening of her left lower extremity weakness that began in the early morning 04/23/2022.  MRI of her L-spine showed changes suggestive of canal stenosis at T11-T12 level.  She was subsequently sent to [facility] for neurosurgical evaluation.   Subjective:   Patient in bed, appears comfortable, denies any headache, no fever, no chest pain or pressure, no shortness of breath , no abdominal pain. No new focal weakness. Bilateral L>R leg weakness.   Assessment  & Plan :    Chronic low back pain with acute on chronic lower extremity weakness left more than right.  She currently does not have signs of cauda equina, good sphincter control, symptoms are acute on chronic, at rest her back pain is stable, CT scan of the brain is unremarkable.  MRI of the L-spine and follow-up T-spine noted.  Neurosurgeon Dr. Christella Noa following, she underwent T11-T12 decompression surgery by Dr. Christella Noa on 04/27/2022, continue supportive care, PT OT, will require placement.  Seems to have tolerated surgery well.  Anxiety - Continue Zoloft  Hyperlipidemia - Continue statin  Essential hypertension - Continue nifedipine, hydralazine, as needed IV  hydralazine during perioperative period.   Chronic trace edema.  Low-dose lasix x1 given on 04/24/2022, continue with TED stockings.  Hyponatremia.  Could be SIADH versus dehydration, since n.p.o. on 04/27/2022 for surgery gentle LR, sodium under 10, hyponatremia improved with hydration continue for another 12 hours.        Condition - Fair  Family Communication  :  Son [name] in detail over the phone on 04/24/2022, explained that  patient will be moderate risk for adverse cardiopulmonary outcome if she goes for surgical correction.  He accepts and understands the risk  Sons [name] and [place] bedside 04/25/22  Son lance bedside 04/27/22   Code Status :  Full  Consults  :  N. Surgery  PUD Prophylaxis :    Procedures  :     MRI T Spine - 1. 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 Associated severe bilateral T11 foraminal stenosis. 2. Widespread advanced thoracic spine degeneration elsewhere. Additional mild spinal stenosis at T2-T3, T7-T8, and T12-L1. Up to mild mass effect on the conus at the latter, but no additional thoracic cord signal abnormality identified. 3. Widespread moderate or severe degenerative neural foraminal stenosis at multiple other thoracic levels. 4. 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 No acute osseous abnormality.  CT head - Non acute  MRI L Spine -  1. 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 2. Multilevel bilateral foraminal stenosis, severe at T11-T12, L3-4, and L5-S1. 3. Severe bilateral facet arthropathy of L5-S1 with associated facet joint effusions, likely reactive. 4. Small amount of fluid within the L5-S1  disc space is favored degenerative. No secondary bony or soft tissue changes to suggest discitis. Correlate with serum inflammatory markers if there is clinical suspicion for infection.      Disposition Plan  :    Status is: Inpatient  DVT Prophylaxis  :    Place TED hose Start: 04/24/22 1223 enoxaparin (LOVENOX) injection 40 mg Start: 04/23/22 2230   Lab Results  Component Value Date   PLT 227 04/28/2022    Diet :  Diet Order             Diet Heart Room service appropriate? Yes with Assist; Fluid consistency: Thin  Diet effective now                    Inpatient Medications  Scheduled Meds:  enoxaparin (LOVENOX) injection  40 mg Subcutaneous Q24H   hydrALAZINE  25 mg Oral TID   latanoprost  1 drop Both Eyes QHS   multivitamin with minerals  1 tablet Oral Daily   mupirocin ointment  1 application. Nasal BID   NIFEdipine  90 mg Oral Daily   pravastatin  40 mg Oral Daily   sertraline  100 mg Oral Daily   Continuous Infusions:   PRN Meds:.acetaminophen **OR** acetaminophen, diazepam, diclofenac Sodium, metoprolol tartrate, morphine injection, ondansetron **OR** ondansetron (ZOFRAN) IV, oxyCODONE, traMADol  Antibiotics  :    Anti-infectives (From admission, onward)    None        Time Spent in minutes  30   Lala Lund M.D on 04/28/2022 at 11:12 AM  To page go to [email or web address]   [facility] -  Office  [phone]  See all Orders from today for further details    Objective:   Vitals:   04/27/22 2100 04/27/22 2122 04/27/22 2158 04/28/22 0830  BP: 133/72 (!) 159/61 (!) 159/61 (!) 143/76  Pulse: 75 73  64  Resp: '12 17  20  '$ Temp:  98.2 F (36.8 C)  98 F (36.7 C)  TempSrc:  Oral  Axillary  SpO2: 98% 93%    Weight:      Height:  Wt Readings from Last 3 Encounters:  04/23/22 111.1 kg  03/08/22 109.3 kg  07/04/21 108 kg     Intake/Output Summary (Last 24 hours) at 04/28/2022 1112 Last data filed at 04/28/2022 0834 Gross  per 24 hour  Intake 800 ml  Output 750 ml  Net 50 ml     Physical Exam  Awake Alert, No new F.N deficits, mild weakness in both lower extremities left more than right  [place].AT,PERRAL Supple Neck, No JVD,   Symmetrical Chest wall movement, Good air movement bilaterally, CTAB RRR,No Gallops, Rubs or new Murmurs,  +ve B.Sounds, Abd Soft, No tenderness,   No Cyanosis, Clubbing or edema      Data Review:    CBC Recent Labs  Lab 04/23/22 1157 04/25/22 0133 04/26/22 0205 04/27/22 0229 04/28/22 0123  WBC 6.0 8.7 9.3 12.1* 12.8*  HGB 10.9* 11.2* 11.1* 10.4* 9.8*  HCT 34.0* 34.3* 33.6* 30.1* 28.8*  PLT 217 215 185 215 227  MCV 87.9 87.3 87.5 84.3 86.2  MCH 28.2 28.5 28.9 29.1 29.3  MCHC 32.1 32.7 33.0 34.6 34.0  RDW 15.2 14.8 14.8 14.5 14.4  LYMPHSABS 1.1 1.4 1.3 0.9 0.3*  MONOABS 0.7 1.1* 0.9 1.3* 0.7  EOSABS 0.1 0.0 0.0 0.0 0.0  BASOSABS 0.0 0.0 0.0 0.0 0.0    Electrolytes Recent Labs  Lab 04/23/22 1157 04/23/22 2238 04/24/22 0341 04/24/22 0631 04/24/22 1217 04/25/22 0133 04/26/22 0205 04/27/22 0229 04/28/22 0123  NA 138  --  135  --   --  136 135 131* 133*  K 3.9  --  3.8  --   --  4.0 3.9 3.7 4.8  CL 106  --  103  --   --  104 104 102 102  CO2 25  --  24  --   --  '24 22 23 22  '$ GLUCOSE 107*  --  111*  --   --  109* 124* 128* 144*  BUN 19  --  14  --   --  '19 19 18 19  '$ CREATININE 0.67  --  0.69  --   --  0.76 0.69 0.70 0.65  CALCIUM 9.5  --  9.3  --   --  9.5 9.2 9.2 9.0  AST  --   --   --   --   --  12* 14* 11* 63*  ALT  --   --   --   --   --  '12 10 12 '$ 38  ALKPHOS  --   --   --   --   --  52 44 43 71  BILITOT  --   --   --   --   --  0.5 0.7 0.5 0.7  ALBUMIN  --   --   --   --   --  3.5 3.2* 3.0* 2.6*  MG  --  2.3  --   --   --  2.0 1.9 2.1 2.4  CRP  --   --   --   --  0.6  --   --   --   --   PROCALCITON  --   --   --   --  <0.10 <0.10 <0.10  --   --   INR  --   --   --  1.0  --   --   --   --   --   HGBA1C 5.6  --   --   --   --   --   --   --   --      ------------------------------------------------------------------------------------------------------------------  No results for input(s): CHOL, HDL, LDLCALC, TRIG, CHOLHDL, LDLDIRECT in the last 72 hours.   Radiology Reports MR THORACIC SPINE WO CONTRAST  Addendum Date: 04/25/2022   ADDENDUM REPORT: 04/25/2022 11:46 ADDENDUM: Study discussed by telephone with the patient's nurse Olayinka on 04/25/2022 at 1135 hours. Electronically Signed   By: Genevie Ann M.D.   On: 04/25/2022 11:46   Result Date: 04/25/2022 CLINICAL DATA:  81 year old female with probable lower thoracic spinal cord stenosis and cord signal abnormality on lumbar MRI 2 days ago. EXAM: MRI THORACIC SPINE WITHOUT CONTRAST TECHNIQUE: Multiplanar, multisequence MR imaging of the thoracic spine was performed. No intravenous contrast was administered. COMPARISON:  Lumbar MRI 04/23/2022. FINDINGS: Limited cervical spine imaging: Widespread cervical spine degeneration with evidence of some spondylolisthesis at C5. Advanced cervical disc space loss, endplate spurring. But only mild cervical spinal stenosis suspected. Thoracic spine segmentation:  Appears to be normal. Alignment: Mild dextroconvex thoracic scoliosis. Relatively preserved thoracic kyphosis. Subtle degenerative appearing anterolisthesis at both T1-T2 and T2-T3. Vertebrae: Underlying evidence of T3-T4 interbody ankylosis. Widespread chronic degenerative thoracic endplate marrow signal changes. Background bone marrow signal within normal limits. No convincing marrow edema or acute osseous abnormality. Cord: Thoracic spinal cord signal and morphology remain normal above T11. But there is degenerative cord compression with abnormal cord signal confirmed at T11-T12 (series 17, image 36). Conus medullaris below that appears stable from the recent lumbar MRI, terminating just below T12-L1. Paraspinal and other soft tissues: Negative visible chest and upper abdominal viscera aside from  probable tortuosity of the thoracic aorta. Thoracic paraspinal soft tissues remain within normal limits. Disc levels: T1-T2: Mild anterolisthesis with disc bulging. Up to moderate facet and ligament flavum hypertrophy. No spinal stenosis. Moderate to severe bilateral T1 foraminal stenosis. T2-T3: Similar anterolisthesis. Disc space loss and disc bulging eccentric to the right. Moderate facet and ligament flavum hypertrophy. Borderline to mild spinal stenosis (series 17, image 8), no convincing cord mass effect. Moderate right T2 foraminal stenosis. T3-T4: Interbody ankylosis. Posterior element hypertrophy. No spinal stenosis. Moderate bilateral T3 foraminal stenosis. T4-T5: Circumferential disc bulge with endplate spurring and moderate to severe facet and ligament flavum hypertrophy. No spinal stenosis. Moderate to severe left greater than right T4 foraminal stenosis. T5-T6: Advanced disc space loss. Bulky circumferential disc osteophyte complex. Moderate facet and ligament flavum hypertrophy. But no significant spinal stenosis. Moderate to severe left and mild right T5 foraminal stenosis. T6-T7: Disc space loss with bulky circumferential disc osteophyte complex eccentric to the left. Moderate facet and ligament flavum hypertrophy. No spinal stenosis. Severe left T6 foraminal stenosis. T7-T8: Similar disc space loss. Bulky circumferential disc osteophyte complex. Severe facet and ligament flavum hypertrophy. Degenerative facet joint fluid. Mild spinal stenosis (series 17, image 23). No cord mass effect. Severe left and mild to moderate right T7 foraminal stenosis. T8-T9: Circumferential disc osteophyte complex eccentric to the left. Moderate to severe facet and ligament flavum hypertrophy. Borderline spinal stenosis. Severe left and mild to moderate right T8 foraminal stenosis. T9-T10: Disc space loss with circumferential disc osteophyte complex. Mild to moderate facet and ligament flavum hypertrophy. No spinal  stenosis. Severe left and mild-to-moderate right T9 foraminal stenosis. T10-T11: Circumferential disc osteophyte complex with mild to moderate facet and ligament flavum hypertrophy. No significant spinal stenosis. Moderate to severe bilateral T10 foraminal stenosis. T11-T12: Disc space loss with bulky circumferential disc osteophyte complex and severe facet and ligament flavum hypertrophy. Degenerative facet joint fluid. Moderate to severe spinal stenosis with cord mass effect. Abnormal cord signal here  more resembles myelomalacia than spinal cord edema (just below the disc space level series 17, image 37). Severe bilateral T11 foraminal stenosis. T12-L1: Disc space loss with bulky circumferential disc osteophyte complex. Moderate facet and ligament flavum hypertrophy. Mild spinal stenosis with up to mild mass effect here at the conus. No conus signal abnormality identified. Mild to moderate left and moderate to severe right T12 foraminal stenosis. IMPRESSION: 1. 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 Associated severe bilateral T11 foraminal stenosis. 2. Widespread advanced thoracic spine degeneration elsewhere. Additional mild spinal stenosis at T2-T3, T7-T8, and T12-L1. Up to mild mass effect on the conus at the latter, but no additional thoracic cord signal abnormality identified. 3. Widespread moderate or severe degenerative neural foraminal stenosis at multiple other thoracic levels. 4. 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 No acute osseous abnormality. Electronically Signed: By: Genevie Ann M.D. On: 04/25/2022 11:30   DG C-Arm 1-60 Min-No Report  Result Date: 04/27/2022 Fluoroscopy was utilized by the requesting physician.  No radiographic interpretation.   DG C-Arm 1-60 Min-No Report  Result Date: 04/27/2022 Fluoroscopy was utilized by the requesting  physician.  No radiographic interpretation.   DG C-Arm 1-60 Min-No Report  Result Date: 04/27/2022 Fluoroscopy was utilized by the requesting physician.  No radiographic interpretation.

## 2022-04-28 NOTE — Progress Notes (Signed)
Physical Therapy Treatment Patient Details Name: Shelley Mitchell MRN: [id number] DOB: [birth year] Today's Date: 04/28/2022   History of Present Illness Pt is an 81 y/o F presenting to ED on 5/19 from home with LLE weakness, MRI head negative, MRI of thoracic spine showed high-grade multifactorial spinal stenosis at T11-T12  with Spinal Cord Compression and Abnormal Spinal Cord Signal. 5/23 underwent T11-12 decompression surgery. PMH includes hyperlipidemia, anxiety, obesity, OA, and tachycardia.    PT Comments    Patient seen for re-assessment s/p back surgery. Strength/weakness in LEs unchanged. Pt unable to achieve upright standing with +2 max assist from elevated bed. Plan for discharge to skilled nursing remains appropriate. Goals down-graded.    Recommendations for follow up therapy are one component of a multi-disciplinary discharge planning process, led by the attending physician.  Recommendations may be updated based on patient status, additional functional criteria and insurance authorization.  Follow Up Recommendations  Skilled nursing-short term rehab (<3 hours/day)     Assistance Recommended at Discharge Frequent or constant Supervision/Assistance  Patient can return home with the following Two people to help with walking and/or transfers;Two people to help with bathing/dressing/bathroom;Assistance with cooking/housework;Help with stairs or ramp for entrance;Assist for transportation   Equipment Recommendations  None recommended by PT    Recommendations for Other Services       Precautions / Restrictions Precautions Precautions: Fall Restrictions Weight Bearing Restrictions: No     Mobility  Bed Mobility Overal bed mobility: Needs Assistance Bed Mobility: Sidelying to Sit, Rolling, Sit to Sidelying Rolling: +2 for physical assistance, Total assist Sidelying to sit: Max assist, +2 for physical assistance, HOB elevated (once sidelying and legs over EOB, elevated HOB to  assist with come to sit)     Sit to sidelying: Max assist, +2 for physical assistance General bed mobility comments: pt with inability to reach across to rail to help with rolling due to rt shoulder pain/limited ROM    Transfers Overall transfer level: Needs assistance Equipment used: Rolling walker (2 wheels) Transfers: Sit to/from Stand Sit to Stand: Max assist, +2 physical assistance, From elevated surface           General transfer comment: use of pad; only achieved ~1/2 standing x 2 trials; ?limited effort by pt due to pain    Ambulation/Gait               General Gait Details: unable   Stairs             Wheelchair Mobility    Modified Rankin (Stroke Patients Only)       Balance Overall balance assessment: Needs assistance Sitting-balance support: Feet supported Sitting balance-Leahy Scale: Poor Sitting balance - Comments: requires mingurard with tendency to lean to her left as she fatigues; some posterior lean as well Postural control: Posterior lean, Left lateral lean                                  Cognition Arousal/Alertness: Awake/alert Behavior During Therapy: WFL for tasks assessed/performed, Flat affect Overall Cognitive Status: Within Functional Limits for tasks assessed                                          Exercises      General Comments General comments (skin integrity, edema, etc.): LE strength re-assessed and unchanged.  Goals downgraded      Pertinent Vitals/Pain Pain Assessment Pain Assessment: 0-10 Pain Score: 8  Pain Location: mid-back Pain Descriptors / Indicators: Discomfort, Grimacing, Guarding Pain Intervention(s): Limited activity within patient's tolerance, Premedicated before session, Monitored during session, Repositioned    Home Living                          Prior Function            PT Goals (current goals can now be found in the care plan section) Acute  Rehab PT Goals Patient Stated Goal: to get stronger PT Goal Formulation: With patient/family Time For Goal Achievement: 05/12/22 Potential to Achieve Goals: Good Progress towards PT goals: Not progressing toward goals - comment    Frequency    Min 3X/week      PT Plan Frequency needs to be updated (continue to assess post-op)    Co-evaluation              AM-PAC PT "6 Clicks" Mobility   Outcome Measure  Help needed turning from your back to your side while in a flat bed without using bedrails?: Total Help needed moving from lying on your back to sitting on the side of a flat bed without using bedrails?: Total Help needed moving to and from a bed to a chair (including a wheelchair)?: Total Help needed standing up from a chair using your arms (e.g., wheelchair or bedside chair)?: Total Help needed to walk in hospital room?: Total Help needed climbing 3-5 steps with a railing? : Total 6 Click Score: 6    End of Session Equipment Utilized During Treatment: Gait belt Activity Tolerance: Patient limited by pain Patient left: in bed;with call bell/phone within reach;with bed alarm set Nurse Communication: Mobility status PT Visit Diagnosis: Unsteadiness on feet (R26.81);Muscle weakness (generalized) (M62.81);Difficulty in walking, not elsewhere classified (R26.2)     Time: 1540-0867 PT Time Calculation (min) (ACUTE ONLY): 22 min  Charges:  $Therapeutic Activity: 8-22 mins                      Arby Barrette, PT Acute Rehabilitation Services  Pager [phone] Office [phone]    Rexanne Mano 04/28/2022, 11:49 AM

## 2022-04-28 NOTE — Progress Notes (Signed)
Patient ID: Shelley Mitchell, female   DOB: Aug 23, 1941, 81 y.o.   MRN: [id number] BP 126/61 (BP Location: Right Wrist)   Pulse 65   Temp 99.1 F (37.3 C) (Oral)   Resp 18   Ht '5\' 1"'$  (1.549 m)   Wt 111.1 kg   SpO2 95%   BMI 46.29 kg/m  Alert and oriented x 4 Needs PT/OT. Will need rehab stay of some sort

## 2022-04-28 NOTE — TOC Progression Note (Addendum)
Transition of Care [facility]) - Progression Note    Patient Details  Name: Shelley Mitchell MRN: [id number] Date of Birth: Apr 03, 1941  [facility]) CM/SW [place], LCSW Phone Number: 04/28/2022, 10:57 AM  Clinical Narrative:    10:57am-CSW met with patient's two sons and daughter in law at bedside and provided SNF bed offers. They will tour facilities today and let CSW decision.   3pm-CSW received choice from son for 1-[place] 2-[place]. Miquel Dunn stated they will review and let CSW know in the morning.    Expected Discharge Plan: Skilled Nursing Facility Barriers to Discharge: Ship broker, Continued Medical Work up, SNF Pending bed offer  Expected Discharge Plan and Services Expected Discharge Plan: [place] In-house Referral: Clinical Social Work   Post Acute Care Choice: [place] Living arrangements for the past 2 months: Single Family Home                                       Social Determinants of Health (SDOH) Interventions    Readmission Risk Interventions     View : No data to display.

## 2022-04-29 DIAGNOSIS — F419 Anxiety disorder, unspecified: Secondary | ICD-10-CM | POA: Diagnosis not present

## 2022-04-29 DIAGNOSIS — I10 Essential (primary) hypertension: Secondary | ICD-10-CM | POA: Diagnosis not present

## 2022-04-29 LAB — COMPREHENSIVE METABOLIC PANEL
ALT: 33 U/L (ref 0–44)
AST: 28 U/L (ref 15–41)
Albumin: 2.3 g/dL — ABNORMAL LOW (ref 3.5–5.0)
Alkaline Phosphatase: 61 U/L (ref 38–126)
Anion gap: 5 (ref 5–15)
BUN: 21 mg/dL (ref 8–23)
CO2: 24 mmol/L (ref 22–32)
Calcium: 8.6 mg/dL — ABNORMAL LOW (ref 8.9–10.3)
Chloride: 102 mmol/L (ref 98–111)
Creatinine, Ser: 0.64 mg/dL (ref 0.44–1.00)
GFR, Estimated: >60 mL/min (ref >60)
Glucose, Bld: 120 mg/dL — ABNORMAL HIGH (ref 70–99)
Potassium: 4.2 mmol/L (ref 3.5–5.1)
Sodium: 131 mmol/L — ABNORMAL LOW (ref 135–145)
Total Bilirubin: 0.4 mg/dL (ref 0.3–1.2)
Total Protein: 6 g/dL — ABNORMAL LOW (ref 6.5–8.1)

## 2022-04-29 LAB — CBC WITH DIFFERENTIAL/PLATELET
Abs Immature Granulocytes: 0.05 10*3/uL (ref 0.00–0.07)
Basophils Absolute: 0 10*3/uL (ref 0.0–0.1)
Basophils Relative: 0 %
Eosinophils Absolute: 0 10*3/uL (ref 0.0–0.5)
Eosinophils Relative: 0 %
HCT: 25.4 % — ABNORMAL LOW (ref 36.0–46.0)
Hemoglobin: 8.7 g/dL — ABNORMAL LOW (ref 12.0–15.0)
Immature Granulocytes: 1 %
Lymphocytes Relative: 8 %
Lymphs Abs: 0.8 10*3/uL (ref 0.7–4.0)
MCH: 29.4 pg (ref 26.0–34.0)
MCHC: 34.3 g/dL (ref 30.0–36.0)
MCV: 85.8 fL (ref 80.0–100.0)
Monocytes Absolute: 1.1 10*3/uL — ABNORMAL HIGH (ref 0.1–1.0)
Monocytes Relative: 10 %
Neutro Abs: 8.6 10*3/uL — ABNORMAL HIGH (ref 1.7–7.7)
Neutrophils Relative %: 81 %
Platelets: 236 10*3/uL (ref 150–400)
RBC: 2.96 MIL/uL — ABNORMAL LOW (ref 3.87–5.11)
RDW: 14.2 % (ref 11.5–15.5)
WBC: 10.5 10*3/uL (ref 4.0–10.5)
nRBC: 0 % (ref 0.0–0.2)

## 2022-04-29 LAB — MAGNESIUM: Magnesium: 2.2 mg/dL (ref 1.7–2.4)

## 2022-04-29 MED ORDER — BISACODYL 5 MG PO TBEC
10.0000 mg | DELAYED_RELEASE_TABLET | Freq: Once | ORAL | Status: AC
  Administered 2022-04-29: 10 mg via ORAL
  Filled 2022-04-29: qty 2

## 2022-04-29 MED ORDER — DOCUSATE SODIUM 100 MG PO CAPS
200.0000 mg | ORAL_CAPSULE | Freq: Two times a day (BID) | ORAL | Status: DC
  Administered 2022-04-29 – 2022-04-30 (×3): 200 mg via ORAL
  Filled 2022-04-29 (×3): qty 2

## 2022-04-29 MED ORDER — FUROSEMIDE 40 MG PO TABS
40.0000 mg | ORAL_TABLET | Freq: Once | ORAL | Status: AC
  Administered 2022-04-29: 40 mg via ORAL
  Filled 2022-04-29: qty 1

## 2022-04-29 MED ORDER — POLYETHYLENE GLYCOL 3350 17 G PO PACK
17.0000 g | PACK | Freq: Two times a day (BID) | ORAL | Status: DC
  Administered 2022-04-29 – 2022-04-30 (×3): 17 g via ORAL
  Filled 2022-04-29 (×3): qty 1

## 2022-04-29 NOTE — TOC Progression Note (Signed)
Transition of Care [facility]) - Progression Note    Patient Details  Name: Shelley Mitchell MRN: [id number] Date of Birth: 12/22/1940  Transition of Care [facility]) CM/SW [place], LCSW Phone Number: 04/29/2022, 3:36 PM  Clinical Narrative:    CSW received call from patient's son, [name]. He confirmed family's choice of [facility] can accept patient. CSW initiated insurance authorization process through [employer], Ref# [id number].    Expected Discharge Plan: Skilled Nursing Facility Barriers to Discharge: Ship broker, Continued Medical Work up, SNF Pending bed offer  Expected Discharge Plan and Services Expected Discharge Plan: [place] In-house Referral: Clinical Social Work   Post Acute Care Choice: [place] Living arrangements for the past 2 months: Single Family Home                                       Social Determinants of Health (SDOH) Interventions    Readmission Risk Interventions     View : No data to display.

## 2022-04-29 NOTE — Progress Notes (Signed)
PROGRESS NOTE                                                                                                                                                                                                             Patient Demographics:    Shelley Mitchell, is a 81 y.o. female, DOB - Sep 21, 1941, HYQ:[id number]  Outpatient Primary MD for the patient is [name], MD    LOS - 6  Admit date - 04/23/2022    Chief Complaint  Patient presents with   Extremity Weakness       Brief Narrative (HPI from H&P)   81 year old female with a history of hypertension, hyperlipidemia, anxiety, chronic back pain with some chronic lower extremity weakness who presented to [facility] with acute on chronic worsening of her left lower extremity weakness that began in the early morning 04/23/2022.  MRI of her L-spine showed changes suggestive of canal stenosis at T11-T12 level.  She was subsequently sent to [facility] for neurosurgical evaluation.   Subjective:   Patient in bed denies any headache, no chest or abdominal pain, no shortness of breath, mild low back pain at the site of surgery, continues to have bilateral L>R leg weakness.   Assessment  & Plan :    Chronic low back pain with acute on chronic lower extremity weakness left more than right.  She currently does not have signs of cauda equina, good sphincter control, symptoms are acute on chronic, at rest her back pain is stable, CT scan of the brain is unremarkable.  MRI of the L-spine and follow-up T-spine noted.  Neurosurgeon Dr. Christella Noa following, she underwent T11-T12 decompression surgery by Dr. Christella Noa on 04/27/2022, continue supportive care, PT OT, will require placement.  Seems to have tolerated surgery well.  Anxiety - Continue Zoloft  Hyperlipidemia - Continue statin  Essential hypertension - Continue nifedipine, hydralazine, as needed IV hydralazine during  perioperative period.   Chronic trace edema.  Low-dose lasix x1 given on 04/24/2022, continue with TED stockings.  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        Condition - Fair  Family Communication  :  Son [name] in detail over the phone on 04/24/2022, explained that patient will be moderate risk for adverse cardiopulmonary outcome  if she goes for surgical correction.  He accepts and understands the risk  Sons [name] and [name] bedside 04/25/22  Son lance bedside 04/27/22   Code Status :  Full  Consults  :  N. Surgery  PUD Prophylaxis :    Procedures  :     T11-T12 decompression by Dr. Christella Noa on 04/27/2022.    MRI T Spine - 1. 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 Associated severe bilateral T11 foraminal stenosis. 2. Widespread advanced thoracic spine degeneration elsewhere. Additional mild spinal stenosis at T2-T3, T7-T8, and T12-L1. Up to mild mass effect on the conus at the latter, but no additional thoracic cord signal abnormality identified. 3. Widespread moderate or severe degenerative neural foraminal stenosis at multiple other thoracic levels. 4. 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 No acute osseous abnormality.  CT head - Non acute  MRI L Spine -  1. 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 2. Multilevel bilateral foraminal stenosis, severe at T11-T12, L3-4, and L5-S1. 3. Severe bilateral facet arthropathy of L5-S1 with associated facet joint effusions, likely reactive. 4. Small amount of fluid within the L5-S1 disc space is favored  degenerative. No secondary bony or soft tissue changes to suggest discitis. Correlate with serum inflammatory markers if there is clinical suspicion for infection.      Disposition Plan  :    Status is: Inpatient  DVT Prophylaxis  :    Place TED hose Start: 04/24/22 1223 enoxaparin (LOVENOX) injection 40 mg Start: 04/23/22 2230   Lab Results  Component Value Date   PLT 236 04/29/2022    Diet :  Diet Order             Diet Heart Room service appropriate? Yes with Assist; Fluid consistency: Thin; Fluid restriction: 1200 mL Fluid  Diet effective now                    Inpatient Medications  Scheduled Meds:  Chlorhexidine Gluconate Cloth  6 each Topical Daily   enoxaparin (LOVENOX) injection  40 mg Subcutaneous Q24H   hydrALAZINE  25 mg Oral TID   latanoprost  1 drop Both Eyes QHS   multivitamin with minerals  1 tablet Oral Daily   NIFEdipine  90 mg Oral Daily   pravastatin  40 mg Oral Daily   sertraline  100 mg Oral Daily   Continuous Infusions:   PRN Meds:.acetaminophen **OR** acetaminophen, diazepam, diclofenac Sodium, metoprolol tartrate, morphine injection, ondansetron **OR** ondansetron (ZOFRAN) IV, oxyCODONE, traMADol  Antibiotics  :    Anti-infectives (From admission, onward)    None        Time Spent in minutes  30   Lala Lund M.D on 04/29/2022 at 10:09 AM  To page go to [email or web address]   [facility] -  Office  [phone]  See all Orders from today for further details    Objective:   Vitals:   04/28/22 1930 04/28/22 2332 04/29/22 0310 04/29/22 0816  BP: 126/61 (!) 132/54 (!) 134/58 (!) 144/72  Pulse: 65 61 62 60  Resp: 18 (!) '23 18 20  '$ Temp: 99.1 F (37.3 C) 98.9 F (37.2 C) 98.3 F (36.8 C) 97.9 F (36.6 C)  TempSrc: Oral Oral Oral Oral  SpO2: 95% 92% 92% 96%  Weight:      Height:        Wt Readings from Last 3 Encounters:  04/23/22 111.1 kg  03/08/22 109.3 kg  07/04/21 108 kg     Intake/Output Summary  (Last 24 hours) at 04/29/2022 1009 Last data filed at 04/29/2022 0646 Gross per 24 hour  Intake 1621.4 ml  Output 1250 ml  Net 371.4 ml     Physical Exam  Awake Alert, No new F.N deficits, mild weakness in both lower extremities left more than right  [place].AT,PERRAL Supple Neck, No JVD,   Symmetrical Chest wall movement, Good air movement bilaterally, CTAB RRR,No Gallops, Rubs or new Murmurs,  +ve B.Sounds, Abd Soft, No tenderness,   No Cyanosis, Clubbing or edema       Data Review:    CBC Recent Labs  Lab 04/25/22 0133 04/26/22 0205 04/27/22 0229 04/28/22 0123 04/29/22 0415  WBC 8.7 9.3 12.1* 12.8* 10.5  HGB 11.2* 11.1* 10.4* 9.8* 8.7*  HCT 34.3* 33.6* 30.1* 28.8* 25.4*  PLT 215 185 215 227 236  MCV 87.3 87.5 84.3 86.2 85.8  MCH 28.5 28.9 29.1 29.3 29.4  MCHC 32.7 33.0 34.6 34.0 34.3  RDW 14.8 14.8 14.5 14.4 14.2  LYMPHSABS 1.4 1.3 0.9 0.3* 0.8  MONOABS 1.1* 0.9 1.3* 0.7 1.1*  EOSABS 0.0 0.0 0.0 0.0 0.0  BASOSABS 0.0 0.0 0.0 0.0 0.0    Electrolytes Recent Labs  Lab 04/23/22 1157 04/23/22 2238 04/24/22 0631 04/24/22 1217 04/25/22 0133 04/26/22 0205 04/27/22 0229 04/28/22 0123 04/29/22 0415  NA 138   < >  --   --  136 135 131* 133* 131*  K 3.9   < >  --   --  4.0 3.9 3.7 4.8 4.2  CL 106   < >  --   --  104 104 102 102 102  CO2 25   < >  --   --  '24 22 23 22 24  '$ GLUCOSE 107*   < >  --   --  109* 124* 128* 144* 120*  BUN 19   < >  --   --  '19 19 18 19 21  '$ CREATININE 0.67   < >  --   --  0.76 0.69 0.70 0.65 0.64  CALCIUM 9.5   < >  --   --  9.5 9.2 9.2 9.0 8.6*  AST  --   --   --   --  12* 14* 11* 63* 28  ALT  --   --   --   --  '12 10 12 '$ 38 33  ALKPHOS  --   --   --   --  52 44 43 71 61  BILITOT  --   --   --   --  0.5 0.7 0.5 0.7 0.4  ALBUMIN  --   --   --   --  3.5 3.2* 3.0* 2.6* 2.3*  MG  --    < >  --   --  2.0 1.9 2.1 2.4 2.2  CRP  --   --   --  0.6  --   --   --   --   --   PROCALCITON  --   --   --  <0.10 <0.10 <0.10  --   --   --   INR  --   --   1.0  --   --   --   --   --   --  HGBA1C 5.6  --   --   --   --   --   --   --   --    < > = values in this interval not displayed.    ------------------------------------------------------------------------------------------------------------------ No results for input(s): CHOL, HDL, LDLCALC, TRIG, CHOLHDL, LDLDIRECT in the last 72 hours.   Radiology Reports MR THORACIC SPINE WO CONTRAST  Addendum Date: 04/25/2022   ADDENDUM REPORT: 04/25/2022 11:46 ADDENDUM: Study discussed by telephone with the patient's nurse Olayinka on 04/25/2022 at 1135 hours. Electronically Signed   By: Genevie Ann M.D.   On: 04/25/2022 11:46   Result Date: 04/25/2022 CLINICAL DATA:  81 year old female with probable lower thoracic spinal cord stenosis and cord signal abnormality on lumbar MRI 2 days ago. EXAM: MRI THORACIC SPINE WITHOUT CONTRAST TECHNIQUE: Multiplanar, multisequence MR imaging of the thoracic spine was performed. No intravenous contrast was administered. COMPARISON:  Lumbar MRI 04/23/2022. FINDINGS: Limited cervical spine imaging: Widespread cervical spine degeneration with evidence of some spondylolisthesis at C5. Advanced cervical disc space loss, endplate spurring. But only mild cervical spinal stenosis suspected. Thoracic spine segmentation:  Appears to be normal. Alignment: Mild dextroconvex thoracic scoliosis. Relatively preserved thoracic kyphosis. Subtle degenerative appearing anterolisthesis at both T1-T2 and T2-T3. Vertebrae: Underlying evidence of T3-T4 interbody ankylosis. Widespread chronic degenerative thoracic endplate marrow signal changes. Background bone marrow signal within normal limits. No convincing marrow edema or acute osseous abnormality. Cord: Thoracic spinal cord signal and morphology remain normal above T11. But there is degenerative cord compression with abnormal cord signal confirmed at T11-T12 (series 17, image 36). Conus medullaris below that appears stable from the recent lumbar  MRI, terminating just below T12-L1. Paraspinal and other soft tissues: Negative visible chest and upper abdominal viscera aside from probable tortuosity of the thoracic aorta. Thoracic paraspinal soft tissues remain within normal limits. Disc levels: T1-T2: Mild anterolisthesis with disc bulging. Up to moderate facet and ligament flavum hypertrophy. No spinal stenosis. Moderate to severe bilateral T1 foraminal stenosis. T2-T3: Similar anterolisthesis. Disc space loss and disc bulging eccentric to the right. Moderate facet and ligament flavum hypertrophy. Borderline to mild spinal stenosis (series 17, image 8), no convincing cord mass effect. Moderate right T2 foraminal stenosis. T3-T4: Interbody ankylosis. Posterior element hypertrophy. No spinal stenosis. Moderate bilateral T3 foraminal stenosis. T4-T5: Circumferential disc bulge with endplate spurring and moderate to severe facet and ligament flavum hypertrophy. No spinal stenosis. Moderate to severe left greater than right T4 foraminal stenosis. T5-T6: Advanced disc space loss. Bulky circumferential disc osteophyte complex. Moderate facet and ligament flavum hypertrophy. But no significant spinal stenosis. Moderate to severe left and mild right T5 foraminal stenosis. T6-T7: Disc space loss with bulky circumferential disc osteophyte complex eccentric to the left. Moderate facet and ligament flavum hypertrophy. No spinal stenosis. Severe left T6 foraminal stenosis. T7-T8: Similar disc space loss. Bulky circumferential disc osteophyte complex. Severe facet and ligament flavum hypertrophy. Degenerative facet joint fluid. Mild spinal stenosis (series 17, image 23). No cord mass effect. Severe left and mild to moderate right T7 foraminal stenosis. T8-T9: Circumferential disc osteophyte complex eccentric to the left. Moderate to severe facet and ligament flavum hypertrophy. Borderline spinal stenosis. Severe left and mild to moderate right T8 foraminal stenosis. T9-T10:  Disc space loss with circumferential disc osteophyte complex. Mild to moderate facet and ligament flavum hypertrophy. No spinal stenosis. Severe left and mild-to-moderate right T9 foraminal stenosis. T10-T11: Circumferential disc osteophyte complex with mild to moderate facet and ligament flavum hypertrophy. No significant spinal stenosis.  Moderate to severe bilateral T10 foraminal stenosis. T11-T12: Disc space loss with bulky circumferential disc osteophyte complex and severe facet and ligament flavum hypertrophy. Degenerative facet joint fluid. Moderate to severe spinal stenosis with cord mass effect. Abnormal cord signal here more resembles myelomalacia than spinal cord edema (just below the disc space level series 17, image 37). Severe bilateral T11 foraminal stenosis. T12-L1: Disc space loss with bulky circumferential disc osteophyte complex. Moderate facet and ligament flavum hypertrophy. Mild spinal stenosis with up to mild mass effect here at the conus. No conus signal abnormality identified. Mild to moderate left and moderate to severe right T12 foraminal stenosis. IMPRESSION: 1. 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 Associated severe bilateral T11 foraminal stenosis. 2. Widespread advanced thoracic spine degeneration elsewhere. Additional mild spinal stenosis at T2-T3, T7-T8, and T12-L1. Up to mild mass effect on the conus at the latter, but no additional thoracic cord signal abnormality identified. 3. Widespread moderate or severe degenerative neural foraminal stenosis at multiple other thoracic levels. 4. 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 No acute osseous abnormality. Electronically Signed: By: Genevie Ann M.D. On: 04/25/2022 11:30   DG C-Arm 1-60 Min-No Report  Result Date: 04/27/2022 Fluoroscopy was utilized by the requesting physician.  No radiographic  interpretation.   DG C-Arm 1-60 Min-No Report  Result Date: 04/27/2022 Fluoroscopy was utilized by the requesting physician.  No radiographic interpretation.   DG C-Arm 1-60 Min-No Report  Result Date: 04/27/2022 Fluoroscopy was utilized by the requesting physician.  No radiographic interpretation.

## 2022-04-29 NOTE — Plan of Care (Signed)
  Problem: Clinical Measurements: Goal: Will remain free from infection Outcome: Progressing   Problem: Activity: Goal: Risk for activity intolerance will decrease Outcome: Progressing   Problem: Nutrition: Goal: Adequate nutrition will be maintained Outcome: Progressing   Problem: Elimination: Goal: Will not experience complications related to bowel motility Outcome: Progressing   Problem: Elimination: Goal: Will not experience complications related to urinary retention Outcome: Progressing   Problem: Pain Managment: Goal: General experience of comfort will improve Outcome: Progressing

## 2022-04-29 NOTE — Progress Notes (Signed)
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 5/23 underwent T11-12 decompression surgery. PMH includes hyperlipidemia, anxiety, obesity, OA, and tachycardia.   OT comments  Pt seen following PT. Focus of session on self feeding positioned upright in bed with blankets under R elbow to assist pt in ability to reach tray. Pt demonstrating ability to eat and groom with set up.    Recommendations for follow up therapy are one component of a multi-disciplinary discharge planning process, led by the attending physician.  Recommendations may be updated based on patient status, additional functional criteria and insurance authorization.    Follow Up Recommendations  Skilled nursing-short term rehab (<3 hours/day)    Assistance Recommended at Discharge Frequent or constant Supervision/Assistance  Patient can return home with the following  Two people to help with walking and/or transfers;A lot of help with bathing/dressing/bathroom;Assistance with cooking/housework;Direct supervision/assist for medications management;Direct supervision/assist for financial management;Help with stairs or ramp for entrance;Assist for transportation   Equipment Recommendations  Other (comment) (defer to next venue)    Recommendations for Other Services      Precautions / Restrictions Precautions Precautions: Back;Fall       Mobility Bed Mobility                    Transfers                         Balance                                           ADL either performed or assessed with clinical judgement   ADL Overall ADL's : Needs  assistance/impaired Eating/Feeding: Minimal assistance;Bed level Eating/Feeding Details (indicate cue type and reason): placed 4 folded towels under R elbow and set pt up for self feeding using spoon or fork Grooming: Set up;Bed level;Wash/dry hands Grooming Details (indicate cue type and reason): washed hands prior to self feeding                                    Extremity/Trunk Assessment              Vision       Perception     Praxis      Cognition Arousal/Alertness: Awake/alert Behavior During Therapy: WFL for tasks assessed/performed, Flat affect Overall Cognitive Status: Within Functional Limits for tasks assessed                                          Exercises      Shoulder Instructions       General Comments      Pertinent Vitals/ Pain       Pain Assessment Pain Assessment: Faces Faces Pain Scale: Hurts little more Pain Location: mid-back Pain Descriptors / Indicators: Discomfort, Grimacing, Guarding Pain Intervention(s): Repositioned, Patient requesting pain meds-RN notified  Home Living  Prior Functioning/Environment              Frequency  Min 2X/week        Progress Toward Goals  OT Goals(current goals can now be found in the care plan section)  Progress towards OT goals: Progressing toward goals  Acute Rehab OT Goals OT Goal Formulation: With patient Time For Goal Achievement: 05/08/22 Potential to Achieve Goals: Good  Plan Discharge plan remains appropriate;Frequency remains appropriate    Co-evaluation                 AM-PAC OT "6 Clicks" Daily Activity     Outcome Measure   Help from another person eating meals?: A Little Help from another person taking care of personal grooming?: A Little Help from another person toileting, which includes using toliet, bedpan, or urinal?: A Lot Help from another person bathing  (including washing, rinsing, drying)?: A Lot Help from another person to put on and taking off regular upper body clothing?: A Lot Help from another person to put on and taking off regular lower body clothing?: Total 6 Click Score: 13    End of Session    OT Visit Diagnosis: Unsteadiness on feet (R26.81);Other abnormalities of gait and mobility (R26.89);Muscle weakness (generalized) (M62.81)   Activity Tolerance Patient tolerated treatment well   Patient Left in bed;with call bell/phone within reach;with bed alarm set;with family/visitor present   Nurse Communication Patient requests pain meds        Time: 1425-1445 OT Time Calculation (min): 20 min  Charges: OT General Charges $OT Visit: 1 Visit OT Treatments $Self Care/Home Management : 8-22 mins  Nestor Lewandowsky, OTR/L Acute Rehabilitation Services Pager: [phone] Office: [phone]   Malka So 04/29/2022, 3:08 PM

## 2022-04-29 NOTE — Progress Notes (Signed)
Patient ID: Shelley Mitchell, female   DOB: 09-Jul-1941, 81 y.o.   MRN: [id number] BP (!) 152/65   Pulse (!) 59   Temp 97.8 F (36.6 C) (Oral)   Resp 17   Ht '5\' 1"'$  (1.549 m)   Wt 111.1 kg   SpO2 95%   BMI 46.29 kg/m  Alert and oriented x 4 Moving all extremities, lower extremities weakly Back pain is limiting PT Improving nicely with OT Continue with therapies. Wound is clean, and dry

## 2022-04-29 NOTE — Progress Notes (Signed)
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 5/23 underwent T11-12 decompression surgery. PMH includes hyperlipidemia, anxiety, obesity, OA, and tachycardia.    PT Comments    Pt limited by 9/10 pain in back with attempts at sit<>stand with Stedy. Attempted weightshifts in sitting for placement of lift pad to lift to chair pt could not tolerate due to pain. Pt is maxAx2 for coming to sitting and total Ax2 for return to bed. D/c plan remain appropriate. PT will continue to follow acutely.     Recommendations for follow up therapy are one component of a multi-disciplinary discharge planning process, led by the attending physician.  Recommendations may be updated based on patient status, additional functional criteria and insurance authorization.  Follow Up Recommendations  Skilled nursing-short term rehab (<3 hours/day)     Assistance Recommended at Discharge Frequent or constant Supervision/Assistance  Patient can return home with the following Two people to help with walking and/or transfers;Two people to help with bathing/dressing/bathroom;Assistance with cooking/housework;Help with stairs or ramp for entrance;Assist for transportation   Equipment Recommendations  None recommended by PT    Recommendations for Other Services       Precautions / Restrictions Precautions Precautions: Fall;Back Restrictions Weight Bearing Restrictions: No     Mobility  Bed Mobility Overal bed mobility: Needs Assistance Bed Mobility: Sidelying to Sit, Rolling, Sit to Sidelying Rolling: +2 for physical assistance, Total assist Sidelying to sit: Max assist, +2 for  physical assistance, HOB elevated (once sidelying and legs over EOB, maxA to bring trunk to upright)     Sit to sidelying: Max assist, +2 for physical assistance General bed mobility comments: pt limited in rolling by R shoulder pain, pt with very good command follow for back precautions with rolling and sidelying to sit and sit>sidelying    Transfers Overall transfer level: Needs assistance Equipment used: Rolling walker (2 wheels) Transfers: Sit to/from Stand Sit to Stand: +2 physical assistance, From elevated surface, Total assist           General transfer comment: attempted to use Bariatric Stedy however pt unable to pull up especially with R UE due to R shoulder pain    Ambulation/Gait               General Gait Details: unable       Balance Overall balance assessment: Needs assistance Sitting-balance support: Feet supported Sitting balance-Leahy Scale: Fair Sitting balance - Comments: able to static sit for approx ~2 min without outside support Postural control: Posterior lean, Left lateral lean                                  Cognition Arousal/Alertness: Awake/alert Behavior During Therapy: WFL for tasks assessed/performed, Flat affect Overall Cognitive Status: Within Functional Limits for tasks assessed                                             General Comments General comments (skin integrity, edema, etc.): pt continues to have decreased LE strength  Pertinent Vitals/Pain Pain Assessment Pain Assessment: 0-10 Pain Score: 9  Pain Location: mid-back with attempt to stand and weightshift in sitting Pain Descriptors / Indicators: Discomfort, Grimacing, Guarding Pain Intervention(s): Limited activity within patient's tolerance, Monitored during session, Repositioned     PT Goals (current goals can now be found in the care plan section) Acute Rehab PT Goals Patient Stated Goal: to get stronger PT Goal  Formulation: With patient/family Time For Goal Achievement: 05/12/22 Potential to Achieve Goals: Good Progress towards PT goals: Not progressing toward goals - comment (limited by pain)    Frequency    Min 3X/week      PT Plan Frequency needs to be updated (continue to assess post-op)       AM-PAC PT "6 Clicks" Mobility   Outcome Measure  Help needed turning from your back to your side while in a flat bed without using bedrails?: Total Help needed moving from lying on your back to sitting on the side of a flat bed without using bedrails?: Total Help needed moving to and from a bed to a chair (including a wheelchair)?: Total Help needed standing up from a chair using your arms (e.g., wheelchair or bedside chair)?: Total Help needed to walk in hospital room?: Total Help needed climbing 3-5 steps with a railing? : Total 6 Click Score: 6    End of Session Equipment Utilized During Treatment: Gait belt Activity Tolerance: Patient limited by pain Patient left: in bed;with call bell/phone within reach;with bed alarm set Nurse Communication: Mobility status PT Visit Diagnosis: Unsteadiness on feet (R26.81);Muscle weakness (generalized) (M62.81);Difficulty in walking, not elsewhere classified (R26.2)     Time: 9233-0076 PT Time Calculation (min) (ACUTE ONLY): 47 min  Charges:  $Therapeutic Activity: 38-52 mins                     Jakyiah Briones B. Migdalia Dk PT, DPT Acute Rehabilitation Services Please use secure chat or  Call Office [phone]    [place] 04/29/2022, 3:27 PM

## 2022-04-30 DIAGNOSIS — I10 Essential (primary) hypertension: Secondary | ICD-10-CM | POA: Diagnosis not present

## 2022-04-30 DIAGNOSIS — M4805 Spinal stenosis, thoracolumbar region: Secondary | ICD-10-CM | POA: Diagnosis not present

## 2022-04-30 LAB — COMPREHENSIVE METABOLIC PANEL
ALT: 55 U/L — ABNORMAL HIGH (ref 0–44)
AST: 44 U/L — ABNORMAL HIGH (ref 15–41)
Albumin: 2.4 g/dL — ABNORMAL LOW (ref 3.5–5.0)
Alkaline Phosphatase: 71 U/L (ref 38–126)
Anion gap: 9 (ref 5–15)
BUN: 19 mg/dL (ref 8–23)
CO2: 24 mmol/L (ref 22–32)
Calcium: 8.8 mg/dL — ABNORMAL LOW (ref 8.9–10.3)
Chloride: 103 mmol/L (ref 98–111)
Creatinine, Ser: 0.54 mg/dL (ref 0.44–1.00)
GFR, Estimated: >60 mL/min (ref >60)
Glucose, Bld: 99 mg/dL (ref 70–99)
Potassium: 4.1 mmol/L (ref 3.5–5.1)
Sodium: 136 mmol/L (ref 135–145)
Total Bilirubin: 0.3 mg/dL (ref 0.3–1.2)
Total Protein: 5.9 g/dL — ABNORMAL LOW (ref 6.5–8.1)

## 2022-04-30 LAB — CBC WITH DIFFERENTIAL/PLATELET
Abs Immature Granulocytes: 0.06 10*3/uL (ref 0.00–0.07)
Basophils Absolute: 0 10*3/uL (ref 0.0–0.1)
Basophils Relative: 0 %
Eosinophils Absolute: 0.1 10*3/uL (ref 0.0–0.5)
Eosinophils Relative: 1 %
HCT: 25.3 % — ABNORMAL LOW (ref 36.0–46.0)
Hemoglobin: 8.4 g/dL — ABNORMAL LOW (ref 12.0–15.0)
Immature Granulocytes: 1 %
Lymphocytes Relative: 11 %
Lymphs Abs: 1 10*3/uL (ref 0.7–4.0)
MCH: 28.7 pg (ref 26.0–34.0)
MCHC: 33.2 g/dL (ref 30.0–36.0)
MCV: 86.3 fL (ref 80.0–100.0)
Monocytes Absolute: 1 10*3/uL (ref 0.1–1.0)
Monocytes Relative: 11 %
Neutro Abs: 6.8 10*3/uL (ref 1.7–7.7)
Neutrophils Relative %: 76 %
Platelets: 259 10*3/uL (ref 150–400)
RBC: 2.93 MIL/uL — ABNORMAL LOW (ref 3.87–5.11)
RDW: 14.4 % (ref 11.5–15.5)
WBC: 8.9 10*3/uL (ref 4.0–10.5)
nRBC: 0 % (ref 0.0–0.2)

## 2022-04-30 MED ORDER — DOCUSATE SODIUM 100 MG PO CAPS: 200.0000 mg | ORAL_CAPSULE | Freq: Two times a day (BID) | ORAL | 0 refills | Status: DC

## 2022-04-30 MED ORDER — TRAMADOL HCL 50 MG PO TABS: 50.0000 mg | ORAL_TABLET | Freq: Four times a day (QID) | ORAL | 0 refills | Status: DC | PRN

## 2022-04-30 MED ORDER — HYDROCHLOROTHIAZIDE 25 MG PO TABS: 25.0000 mg | ORAL_TABLET | Freq: Every day | ORAL | Status: DC

## 2022-04-30 MED ORDER — OXYCODONE HCL 5 MG PO TABS: 5.0000 mg | ORAL_TABLET | Freq: Two times a day (BID) | ORAL | 0 refills | Status: DC | PRN

## 2022-04-30 NOTE — Discharge Instructions (Signed)
Follow with Primary MD [name], MD in 7 days   Get CBC, CMP, Magnesium -  checked next visit within 1 week by  SNF MD   Activity: As tolerated with Full fall precautions use walker/cane & assistance as needed  Disposition SNF  Diet: Heart Healthy    Special Instructions: If you have smoked or chewed Tobacco  in the last 2 yrs please stop smoking, stop any regular Alcohol  and or any Recreational drug use.  On your next visit with your primary care physician please Get Medicines reviewed and adjusted.  Please request your Prim.MD to go over all Hospital Tests and Procedure/Radiological results at the follow up, please get all Hospital records sent to your Prim MD by signing hospital release before you go home.  If you experience worsening of your admission symptoms, develop shortness of breath, life threatening emergency, suicidal or homicidal thoughts you must seek medical attention immediately by calling 911 or calling your MD immediately  if symptoms less severe.  You Must read complete instructions/literature along with all the possible adverse reactions/side effects for all the Medicines you take and that have been prescribed to you. Take any new Medicines after you have completely understood and accpet all the possible adverse reactions/side effects.

## 2022-04-30 NOTE — TOC Progression Note (Signed)
Transition of Care [facility]) - Progression Note    Patient Details  Name: Shelley Mitchell MRN: [id number] Date of Birth: 06/09/1941  Transition of Care [facility]) CM/SW [place], LCSW Phone Number: 04/30/2022, 9:38 AM  Clinical Narrative:    [facility] approval received for [facility], Ref# [id number], Auth ID# [id number], effective 04/30/2022-05/04/2022.   Expected Discharge Plan: [place] Barriers to Discharge: Continued Medical Work up  Expected Discharge Plan and Services Expected Discharge Plan: [place] In-house Referral: Clinical Social Work   Post Acute Care Choice: [place] Living arrangements for the past 2 months: Single Family Home                                       Social Determinants of Health (SDOH) Interventions    Readmission Risk Interventions     View : No data to display.

## 2022-04-30 NOTE — TOC Transition Note (Addendum)
Transition of Care [facility]) - CM/SW Discharge Note   Patient Details  Name: RAMAH LANGHANS MRN: [id number] Date of Birth: [birth year]  Transition of Care [facility] [place]) CM/SW Contact:  Benard Halsted, LCSW Phone Number: 04/30/2022, 11:53 AM   Clinical Narrative:    Patient will DC to: [place] date: 04/30/22 Family notified: [place] Flow Transport by: Corey Harold   Per MD patient ready for DC to [facility]. RN to call report prior to discharge [phone] Room 1006A). RN, patient, patient's family, and facility notified of DC. Discharge Summary and FL2 sent to facility. DC packet on chart including signed script. Ambulance transport requested for patient.   CSW will sign off for now as social work intervention is no longer needed. Please consult [place] again if new needs arise.     Final next level of care: Skilled Nursing Facility Barriers to Discharge: Barriers Resolved   Patient Goals and CMS Choice Patient states their goals for this hospitalization and ongoing recovery are:: Rehab CMS Medicare.gov Compare Post Acute Care list provided to:: Patient Choice offered to / list presented to : Patient, Adult Children  Discharge Placement   Existing PASRR number confirmed : 04/30/22          Patient [facility] bed at: [facility] Patient to be transferred to facility by: Victoria Vera Name of family member notified: Sons Patient and family notified of of transfer: 04/30/22  Discharge Plan and Services In-house Referral: Clinical Social Work   Post Acute Care Choice: [place]                               Social Determinants of Health (SDOH) Interventions     Readmission Risk Interventions     View : No data to display.

## 2022-04-30 NOTE — Discharge Summary (Signed)
Shelley Mitchell:[id number] DOB: 07/17/1941 DOA: 04/23/2022  PCP: [name], MD  Admit date: 04/23/2022  Discharge date: 04/30/2022  Admitted From: Home   Disposition:  SNF   Recommendations for Outpatient Follow-up:   Follow up with PCP in 1-2 weeks  PCP Please obtain BMP/CBC, 2 view CXR in 1week,  (see Discharge instructions)   PCP Please follow up on the following pending results:    Home Health: None   Equipment/Devices: None  Consultations: N.Surg Discharge Condition: Stable    CODE STATUS: Full    Diet Recommendation: Heart Healthy     Chief Complaint  Patient presents with   Extremity Weakness     Brief history of present illness from the day of admission and additional interim summary    81 year old female with a history of hypertension, hyperlipidemia, anxiety, chronic back pain with some chronic lower extremity weakness who presented to [facility] with acute on chronic worsening of her left lower extremity weakness that began in the early morning 04/23/2022.  MRI of her L-spine showed changes suggestive of canal stenosis at T11-T12 level.  She was subsequently sent to [facility] for neurosurgical evaluation.                                                                 Hospital Course   Chronic low back pain with acute on chronic lower extremity weakness left more than right.  She currently does not have signs of cauda equina, good sphincter control, symptoms are acute on chronic, at rest her back pain is stable, CT scan of the brain is unremarkable.  MRI of the L-spine and follow-up T-spine noted.  Neurosurgeon Dr. Christella Noa following, she underwent T11-T12 decompression surgery by Dr. Christella Noa on 04/27/2022, continue supportive care, PT OT, will require if placement.  Seems to have  tolerated surgery well.   Anxiety - Continue Zoloft   Hyperlipidemia - Continue statin   Essential hypertension - Continue nifedipine, blood pressure soft hydralazine held.  Monitor and adjust blood pressure medications at SNF as desired.   Chronic trace edema.  Low-dose lasix x1 given on 04/24/2022, continue with TED stockings.  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   Discharge diagnosis     Principal Problem:   Leg weakness Active Problems:   Essential hypertension   Hyperlipidemia   Anxiety   Spinal stenosis   Thoracic spinal stenosis    Discharge instructions    Discharge Instructions     Diet - low sodium heart healthy   Complete by: As directed    Discharge instructions   Complete by: As directed    Follow with Primary MD [name], MD in 7 days   Get CBC, CMP, Magnesium -  checked next visit within 1 week by  SNF MD   Activity: As tolerated with Full fall precautions use walker/cane & assistance as needed  Disposition SNF  Diet: Heart Healthy    Special Instructions: If you have smoked or chewed Tobacco  in the last 2 yrs please stop smoking, stop any regular Alcohol  and or any Recreational drug use.  On your next visit with your primary care physician please Get Medicines reviewed and adjusted.  Please request your Prim.MD to go over all Hospital Tests and Procedure/Radiological results at the follow up, please get all Hospital records sent to your Prim MD by signing hospital release before you go home.  If you experience worsening of your admission symptoms, develop shortness of breath, life threatening emergency, suicidal or homicidal thoughts you must seek medical attention immediately by calling 911 or calling your MD immediately  if symptoms less severe.  You Must read complete instructions/literature along with all the possible adverse reactions/side effects for all the Medicines you  take and that have been prescribed to you. Take any new Medicines after you have completely understood and accpet all the possible adverse reactions/side effects.   Discharge wound care:   Complete by: As directed    Keep T-spine incision site clean and dry at all times   Increase activity slowly   Complete by: As directed        Discharge Medications   Allergies as of 04/30/2022       Reactions   Ace Inhibitors Itching   Nsaids Itching        Medication List     STOP taking these medications    CeleBREX 200 MG capsule Generic drug: celecoxib   hydrALAZINE 50 MG tablet Commonly known as: APRESOLINE   losartan 100 MG tablet Commonly known as: COZAAR   spironolactone 50 MG tablet Commonly known as: ALDACTONE       TAKE these medications    acetaminophen 500 MG tablet Commonly known as: TYLENOL Take 100 mg by mouth 2 (two) times daily.   ARTHRITIS PAIN RELIEF EX Apply 1 application. topically daily as needed (pain relief).   aspirin EC 81 MG tablet Take 81 mg by mouth daily.   docusate sodium 100 MG capsule Commonly known as: COLACE Take 2 capsules (200 mg total) by mouth 2 (two) times daily.   hydrochlorothiazide 25 MG tablet Commonly known as: HYDRODIURIL Take 1 tablet (25 mg total) by mouth daily. What changed:  medication strength how much to take   latanoprost 0.005 % ophthalmic solution Commonly known as: XALATAN Place 1 drop into both eyes at bedtime.   lubiprostone 8 MCG capsule Commonly known as: Amitiza Take 1 capsule (8 mcg total) by mouth daily with breakfast.   medroxyPROGESTERone 10 MG tablet Commonly known as: PROVERA TAKE 1 TABLET BY MOUTH DAILY 10 DAYS PER MONTH What changed: See the new instructions.   multivitamin with minerals tablet Take 1 tablet by mouth daily.   NIFEdipine 90 MG 24 hr tablet Commonly known as: PROCARDIA XL/NIFEDICAL-XL Take 90 mg by mouth daily.   oxyCODONE 5 MG immediate release tablet Commonly  known as: Oxy IR/ROXICODONE Take 1 tablet (5 mg total) by mouth every 12 (twelve) hours as needed for breakthrough pain or severe pain.   pravastatin 40 MG tablet Commonly known as: PRAVACHOL Take 40 mg by mouth daily.   sertraline 100 MG tablet Commonly known as: ZOLOFT Take 100 mg by mouth daily.   traMADol 50 MG tablet  Commonly known as: ULTRAM Take 1 tablet (50 mg total) by mouth every 6 (six) hours as needed for moderate pain or severe pain.   Voltaren 1 % Gel Generic drug: diclofenac Sodium Apply 2 g topically 4 (four) times daily as needed (pain). Neck/shoulder               Discharge Care Instructions  (From admission, onward)           Start     Ordered   04/30/22 0000  Discharge wound care:       Comments: Keep T-spine incision site clean and dry at all times   04/30/22 1005             Contact information for follow-up providers     [name], MD. Schedule an appointment as soon as possible for a visit in 1 week(s).   Specialty: Family Medicine Contact information: [address] [place] [address] [phone]         Kate Sable, MD .   Specialties: Cardiology, Radiology Contact information: [place] [address] [phone]         Ashok Pall, MD. Schedule an appointment as soon as possible for a visit in 1 week(s).   Specialty: Neurosurgery Contact information: [address] [place] [address] [phone]              Contact information for after-discharge care     Destination     [facility]erred [facility] .   Service: Skilled Nursing Contact information: [address] [place] [phone]                     Major procedures and Radiology Reports - PLEASE review detailed and final reports thoroughly  -       T11-T12 decompression by Dr. Christella Noa on 04/27/2022.     MRI T Spine - 1. 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 Associated severe bilateral T11 foraminal stenosis. 2. Widespread advanced thoracic spine degeneration elsewhere. Additional mild spinal stenosis at T2-T3, T7-T8, and T12-L1. Up to mild mass effect on the conus at the latter, but no additional thoracic cord signal abnormality identified. 3. Widespread moderate or severe degenerative neural foraminal stenosis at multiple other thoracic levels. 4. 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 No acute osseous abnormality.   CT head - Non acute   MRI L Spine -  1. 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 2. Multilevel bilateral foraminal stenosis, severe at T11-T12, L3-4, and L5-S1. 3. Severe bilateral facet arthropathy of L5-S1 with associated facet joint effusions, likely reactive. 4. Small amount of fluid within the L5-S1 disc space is favored degenerative. No secondary bony or soft tissue changes to suggest discitis. Correlate with serum inflammatory markers if there is clinical suspicion for infection.   Today   Subjective    Ambry Oyster today has no headache,no chest abdominal pain,no new weakness tingling or numbness, feels much better wants     Objective   Blood pressure 109/60, pulse (!) 59, temperature 98.8 F (37.1 C), temperature source Oral, resp. rate 19, height '5\' 1"'$  (1.549 m), weight 111.1 kg, SpO2 95 %.  No intake or output data in the 24 hours ending 04/30/22 1005  Exam  Awake Alert, No new F.N deficits, continues to have chronic lower extremity weakness left more than right [place].AT,PERRAL Supple Neck,   Symmetrical Chest wall movement, Good air  movement bilaterally, CTAB RRR,No Gallops,   +ve B.Sounds, Abd Soft, Non tender,  No Cyanosis, Clubbing or edema    Data Review   Recent Labs  Lab 04/26/22 0205 04/27/22 0229 04/28/22 0123 04/29/22 0415 04/30/22 0140  WBC 9.3 12.1* 12.8* 10.5 8.9  HGB 11.1* 10.4* 9.8* 8.7* 8.4*  HCT 33.6* 30.1* 28.8* 25.4* 25.3*  PLT 185 215 227 236 259  MCV 87.5 84.3 86.2 85.8 86.3  MCH 28.9 29.1 29.3 29.4 28.7  MCHC 33.0 34.6 34.0 34.3 33.2  RDW 14.8 14.5 14.4 14.2 14.4  LYMPHSABS 1.3 0.9 0.3* 0.8 1.0  MONOABS 0.9 1.3* 0.7 1.1* 1.0  EOSABS 0.0 0.0 0.0 0.0 0.1  BASOSABS 0.0 0.0 0.0 0.0 0.0    Recent Labs  Lab 04/23/22 1157 04/23/22 2238 04/24/22 0341 04/24/22 0631 04/24/22 1217 04/25/22 0133 04/26/22 0205 04/27/22 0229 04/28/22 0123 04/29/22 0415 04/30/22 0140  NA 138   < >  --   --   --  136 135 131* 133* 131* 136  K 3.9   < >  --   --   --  4.0 3.9 3.7 4.8 4.2 4.1  CL 106   < >  --   --   --  104 104 102 102 102 103  CO2 25   < >  --   --   --  '24 22 23 22 24 24  '$ GLUCOSE 107*   < >  --   --   --  109* 124* 128* 144* 120* 99  BUN 19   < >  --   --   --  '19 19 18 19 21 19  '$ CREATININE 0.67   < >  --   --   --  0.76 0.69 0.70 0.65 0.64 0.54  CALCIUM 9.5   < >  --   --   --  9.5 9.2 9.2 9.0 8.6* 8.8*  AST  --   --    < >  --   --  12* 14* 11* 63* 28 44*  ALT  --   --    < >  --   --  '12 10 12 '$ 38 33 55*  ALKPHOS  --   --    < >  --   --  52 44 43 71 61 71  BILITOT  --   --    < >  --   --  0.5 0.7 0.5 0.7 0.4 0.3  ALBUMIN  --   --    < >  --   --  3.5 3.2* 3.0* 2.6* 2.3* 2.4*  MG  --    < >  --   --   --  2.0 1.9 2.1 2.4 2.2  --   CRP  --   --   --   --  0.6  --   --   --   --   --   --   PROCALCITON  --   --   --   --  <0.10 <0.10 <0.10  --   --   --   --   INR  --   --   --  1.0  --   --   --   --   --   --   --  HGBA1C 5.6  --   --   --   --   --   --   --   --   --   --    < > = values in this interval not displayed.    Total Time in preparing paper work, data  evaluation and todays exam - 23 minutes  Lala Lund M.D on 04/30/2022 at 10:05 AM  [facility]

## 2022-04-30 NOTE — Anesthesia Postprocedure Evaluation (Signed)
Anesthesia Post Note  Patient: Shelley Mitchell  Procedure(s) Performed: Thoracic Eleven-Twelve Decompression (Spine Thoracic)     Patient location during evaluation: PACU Anesthesia Type: General Level of consciousness: patient cooperative and awake Pain management: pain level controlled Vital Signs Assessment: post-procedure vital signs reviewed and stable Respiratory status: spontaneous breathing, nonlabored ventilation, respiratory function stable and patient connected to nasal cannula oxygen Cardiovascular status: blood pressure returned to baseline and stable Postop Assessment: no apparent nausea or vomiting Anesthetic complications: no   No notable events documented.  Last Vitals:  Vitals:   04/30/22 0840 04/30/22 1211  BP: 109/60 (!) 134/47  Pulse:    Resp: 19 20  Temp: 37.1 C 37.5 C  SpO2:      Last Pain:  Vitals:   04/30/22 1211  TempSrc: Axillary  PainSc:                  Brant Peets

## 2022-06-14 ENCOUNTER — Ambulatory Visit: Payer: Medicare PPO | Admitting: Internal Medicine

## 2022-11-18 NOTE — Progress Notes (Signed)
[facility] Note  11/19/2022     CHIEF COMPLAINT Patient presents for Retina Evaluation   HISTORY OF PRESENT ILLNESS: Shelley Mitchell is a 81 y.o. female who presents to the clinic today for:   HPI     Retina Evaluation   In both eyes.  Associated Symptoms Flashes and Floaters.  Context:  distance vision, mid-range vision, near vision, reading and watching TV.  I, the attending physician,  performed the HPI with the patient and updated documentation appropriately.        Comments   Patient is here today based on a referral from Dr. Herbert Deaner for bleeding in the eyes. She is using Latanoprost OU QHS. Her blood pressure is high at times. She states that she was never told that she has diabetes.       Last edited by Bernarda Caffey, MD on 11/19/2022 12:36 PM.    Pt is here on the referral of Dr. Herbert Deaner for concern of bleeding in the back of the eye, pt just started seeing Dr. Herbert Deaner, prior to that she saw Dr. Jeneen Rinks in [place], pt states he did some laser in her left eye, pt is on latanoprost QHS OU, pts last appt with Dr. Jeneen Rinks was in March, pt states she has never been told she has diabetes, pt denies any cardiovascular hx, but did have a sleep study that dx her with sleep apnea, pt also has spinal stenosis, pt states she didn't realize her vision was so bad until she went to live at a nursing home, she states her vision was not that bad while living at home,   Referring physician: Monna Fam, MD [place],  [place] [id number]  HISTORICAL INFORMATION:   [facility] from [facility] Referred by Dr. Herbert Deaner for concern of BDR / PDR LEE:  Ocular Hx- PMH-    CURRENT MEDICATIONS: Current Outpatient Medications (Ophthalmic Drugs)  Medication Sig   latanoprost (XALATAN) 0.005 % ophthalmic solution Place 1 drop into both eyes at bedtime.   No current facility-administered medications for this visit. (Ophthalmic Drugs)    Current Outpatient Medications (Other)  Medication Sig   acetaminophen (TYLENOL) 500 MG tablet Take 100 mg by mouth 2 (two) times daily.   aspirin 81 MG EC tablet Take 81 mg by mouth daily. (Patient not taking: Reported on 04/23/2022)   Capsaicin (ARTHRITIS PAIN RELIEF EX) Apply 1 application. topically daily as needed (pain relief).   docusate sodium (COLACE) 100 MG capsule Take 2 capsules (200 mg total) by mouth 2 (two) times daily.   hydrochlorothiazide (HYDRODIURIL) 25 MG tablet Take 1 tablet (25 mg total) by mouth daily.   lubiprostone (AMITIZA) 8 MCG capsule Take 1 capsule (8 mcg total) by mouth daily with breakfast.   medroxyPROGESTERone (PROVERA) 10 MG tablet TAKE 1 TABLET BY MOUTH DAILY 10 DAYS PER MONTH (Patient taking differently: Take 10 mg by mouth See admin instructions. Take for 10 days per month.)   Multiple Vitamins-Minerals (MULTIVITAMIN WITH MINERALS) tablet Take 1 tablet by mouth daily.   NIFEdipine (PROCARDIA XL/ADALAT-CC) 90 MG 24 hr tablet Take 90 mg by mouth daily.   oxyCODONE (OXY IR/ROXICODONE) 5 MG immediate release tablet Take 1 tablet (5 mg total) by mouth every 12 (twelve) hours as needed for breakthrough pain or severe pain.   pravastatin (PRAVACHOL) 40 MG tablet Take 40 mg by mouth daily.   sertraline (ZOLOFT) 100 MG tablet Take 100 mg by mouth daily.  (  Patient not taking: Reported on 04/23/2022)   traMADol (ULTRAM) 50 MG tablet Take 1 tablet (50 mg total) by mouth every 6 (six) hours as needed for moderate pain or severe pain.   VOLTAREN 1 % GEL Apply 2 g topically 4 (four) times daily as needed (pain). Neck/shoulder   No current facility-administered medications for this visit. (Other)   REVIEW OF SYSTEMS: ROS   Positive for: Eyes, Respiratory Last edited by Annie Paras, COT on 11/19/2022  9:14 AM.     ALLERGIES Allergies  Allergen Reactions   Ace Inhibitors Itching   Nsaids Itching   PAST MEDICAL HISTORY Past Medical History:  Diagnosis  Date   Endometrial polyp    Dr. Elonda Husky   History of endometrial hyperplasia    HTN (hypertension)    Hx of adenomatous colonic polyps 1999   Hyperlipidemia    Mental disorder    GAD   Obesity    Osteoarthritis    Tachycardia    subsequently with bradycardia (?secondary to meds)   Past Surgical History:  Procedure Laterality Date   BACK SURGERY     bilateral knee replacements  2002/2003   CHOLECYSTECTOMY     COLONOSCOPY  03/2002   Dr. Jamesetta Geralds internal hemorrhoids   COLONOSCOPY  06/21/2011   polyp at IC valve (tubular adenoma)   COLONOSCOPY N/A 07/15/2016   one 8 mm polyp at hepatic flexure (tubular adenoma), on 12 mm polyp at hepatic flexure (tubular adenoma), 3 year surveillance if health permits   COLONOSCOPY N/A 10/10/2019   descending colon diverticulosis, two 4-5 mm polyps at splenic flexure (tubular adenomas). No surveillance due to age.    LUMBAR LAMINECTOMY/DECOMPRESSION MICRODISCECTOMY N/A 04/27/2022   Procedure: Thoracic Eleven-Twelve Decompression;  Surgeon: Ashok Pall, MD;  Location: [facility];  Service: Neurosurgery;  Laterality: N/A;   POLYPECTOMY  07/15/2016   Procedure: POLYPECTOMY;  Surgeon: Daneil Dolin, MD;  [facility];  Service: Endoscopy;;  Splenic Flexure polyps x 2 removed via hot snare   POLYPECTOMY  10/10/2019   Procedure: POLYPECTOMY;  Surgeon: Daneil Dolin, MD;  [facility];  Service: Endoscopy;;   TUBAL LIGATION     FAMILY HISTORY Family History  Problem Relation Age of Onset   Diabetes Brother    Colon polyps Brother    Diabetes Sister    Cancer Father        Likely prostate   Stroke Mother 81   Colon cancer Neg Hx    SOCIAL HISTORY Social History   Tobacco Use   Smoking status: Never   Smokeless tobacco: Never  Vaping Use   Vaping Use: Never used  Substance Use Topics   Alcohol use: No    Alcohol/week: 0.0 standard drinks of alcohol   Drug use: No       OPHTHALMIC EXAM:  Base Eye Exam     Visual  Acuity (Snellen - Linear)       Right Left   Dist cc 20/50 +2 20/150 +2   Dist ph cc NI 20/80 +2    Correction: Glasses         Tonometry (Tonopen, 9:20 AM)       Right Left   Pressure 11 8         Pupils       Dark Light Shape React APD   Right 3 2 Round Minimal None   Left 3 2 Round Minimal None         Visual Fields  Left Right    Full Full         Extraocular Movement       Right Left    Full, Ortho Full, Ortho         Neuro/Psych     Oriented x3: Yes         Dilation     Both eyes: 2.5% Phenylephrine, 1.0% Mydriacyl @ 9:15 AM           Slit Lamp and Fundus Exam     Slit Lamp Exam       Right Left   Lids/Lashes Dermatochalasis - upper lid Dermatochalasis - upper lid   Conjunctiva/Sclera mild melanosis mild melanosis   Cornea arcus arcus   Anterior Chamber deep and clear deep and clear   Iris Round and dilated Round and dilated   Lens 2+ Nuclear sclerosis, 2+ Cortical cataract 2-3+ Nuclear sclerosis, 2-3+ Cortical cataract   Anterior Vitreous Vitreous syneresis Vitreous syneresis, Posterior vitreous detachment         Fundus Exam       Right Left   Disc nasal hyperemia, +heme, sharp rim, temporal elevation, PPA, mild temporal pallor, peripapillary subretinal heme superior and nasal to disc Pink and Sharp, mild tilt, +PPA   C/D Ratio 0.2 0.3   Macula Flat, Blunted foveal reflex, inferior macula atrophic Blunted foveal reflex, central and inferior IRH and exudates, central cystic changes, retinal macroanuerysms inferior macula, scattered DBH   Vessels attenuated, Tortuous attenuated, Tortuous, retinal macroaneurysms along IT arcades   Periphery Attached, peripapillary subretinal heme superior and nasal to disc, peripheral subretinal heme and exudates temporal periphery Attached, scattered IRH           Refraction     Wearing Rx       Sphere Cylinder Axis Add   Right -2.00 +0.75 165 +2.00   Left -1.25 +0.50 148 +2.00            IMAGING AND PROCEDURES  Imaging and Procedures for 11/19/2022  OCT, Retina - OU - Both Eyes       Right Eye Quality was good. Central Foveal Thickness: 235. Progression has no prior data. Findings include normal foveal contour, no IRF, myopic contour, subretinal fluid, inner retinal atrophy (Diffuse IRA inferior macula / hemisphere -- ?remote BRAO; choroidal hyper reflective material underneath disc and elevation of disc, trace sliver of SRF nasal to disc).   Left Eye Quality was good. Central Foveal Thickness: 266. Progression has no prior data. Findings include abnormal foveal contour, myopic contour, retinal drusen , subretinal hyper-reflective material, intraretinal hyper-reflective material, intraretinal fluid, outer retinal atrophy (Elevation of disc, central ORA, drusen, SRHM and cystic changes greatest temporal fovea).   Notes *Images captured and stored on drive  Diagnosis / Impression:  OD: Diffuse IRA inferior macula / hemisphere -- ?remote BRAO; choroidal hyper reflective material underneath disc and elevation of disc, trace sliver of SRF nasal to disc OS: Elevation of disc, central ORA, drusen, SRHM and cystic changes greatest temporal fovea  Clinical management:  See below  Abbreviations: NFP - Normal foveal profile. CME - cystoid macular edema. PED - pigment epithelial detachment. IRF - intraretinal fluid. SRF - subretinal fluid. EZ - ellipsoid zone. ERM - epiretinal membrane. ORA - outer retinal atrophy. ORT - outer retinal tubulation. SRHM - subretinal hyper-reflective material. IRHM - intraretinal hyper-reflective material      Fluorescein Angiography Optos (Transit OS)       Right Eye Progression has no prior data. Early phase  findings include staining (Mild staining of disc, focal staining distal ST arcades). Mid/Late phase findings include leakage, staining (Staining and leakage of disc and distal ST arcades).   Left Eye Progression has no prior  data. Early phase findings include staining. Mid/Late phase findings include leakage, staining (Staining of disc, focal staining and leakage inferior macula and superior to disc).   Notes **Images stored on drive**  Impression: OD: Staining and leakage of disc and distal ST arcades OS: Staining of disc, focal staining and leakage inferior macula and superior to disc      Intravitreal Injection, Pharmacologic Agent - OS - Left Eye       Time Out 11/19/2022. 11:27 AM. Confirmed correct patient, procedure, site, and patient consented.   Anesthesia Topical anesthesia was used. Anesthetic medications included Lidocaine 2%, Proparacaine 0.5%.   Procedure Preparation included 5% betadine to ocular surface, eyelid speculum. A supplied needle was used.   Injection: 1.25 mg Bevacizumab 1.'25mg'$ /0.57m   Route: Intravitreal, Site: Left Eye   NDC: [id number] Lot: [id number]'$ , Expiration date: 12/13/2022   Post-op Post injection exam found visual acuity of at least counting fingers. The patient tolerated the procedure well. There were no complications. The patient received written and verbal post procedure care education.            ASSESSMENT/PLAN:    ICD-10-CM   1. 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 Essential hypertension  I10     5. Hypertensive retinopathy of both eyes  H35.033 Fluorescein Angiography Optos (Transit OS)    6. Combined forms of age-related cataract of both eyes  H25.813      1,2. BRVO w/ CME and retinal macronauerysm OS  - [place]  [facility] - history of laser photoablation of retinal macroaneurysm OS per chart review - The natural history of retinal vein occlusion and macular edema and treatment options including observation, laser photocoagulation, and intravitreal antiVEGF injection with Avastin and Lucentis and Eylea and intravitreal injection of steroids with triamcinolone and Ozurdex and the complications of these procedures including loss of vision, infection, cataract, glaucoma, and retinal detachment were discussed with patient. - Specifically discussed patient stabilization with anti-VEGF agents and increased potential for visual improvements.  Also discussed need for frequent follow up and potentially multiple injections given the chronic nature of the disease process - BCVA 20/80 - exam shows macroaneurysm with surrounding circinate exudates and edema, inferior macula OS - OCT shows OS: Elevation of disc, central ORA, drusen, SRHM and cystic changes greatest temporal fovea - FA 12.15.23 shows focal macroaneurysms w/ late leakage - recommend IVA OS #1 today, 12.15.23 - RBA of procedure discussed, questions answered - IVA informed consent obtained and signed, 12.15.23 (OS) - see procedure note - F/U 4 weeks -- DFE/OCT/possible injection  3. BRAO OD  - OCT shows significant inner retinal atrophy in inferior macula and hemisphere -- consistent with remote inferior BRAO  - exam shows peripapillary hemorrhage, mild NVD nasal disc  -  FA 12.15.23 shows mild NVD nasal disc w/ leakage  - pt may benefit from PRP and anti-VEGF therapy OD  4,5. Hypertensive retinopathy OU - discussed importance of tight BP control - monitor  6. Mixed Cataract OU - The symptoms of cataract, surgical options, and treatments and risks were discussed with patient. - discussed diagnosis and progression - likely visually significant - monitor for now, but will refer for cataract eval once retinas more stable  Ophthalmic Meds  Ordered this visit:  Meds ordered this encounter  Medications   Bevacizumab (AVASTIN) SOLN 1.25 mg     Return in about 4 weeks (around 12/17/2022) for f/u BRVO OS, DFE, OCT.  There are no Patient Instructions on file for this visit.   Explained the diagnoses, plan, and follow up with the patient and they expressed understanding.  Patient expressed understanding of the importance of proper follow up care.   This document serves as a record of services personally performed by Gardiner Sleeper, MD, PhD. It was created on their behalf by San Jetty. Owens Shark, OA an ophthalmic technician. The creation of this record is the provider's dictation and/or activities during the visit.    Electronically signed by: San Jetty. Owens Shark, [place] 12.14.2023 12:52 PM  Gardiner Sleeper, M.D., Ph.D. Diseases & Surgery of the Retina and Vitreous [facility]  I have reviewed the above documentation for accuracy and completeness, and I agree with the above. Gardiner Sleeper, M.D., Ph.D. 11/19/22 1:04 PM  Abbreviations: M myopia (nearsighted); A astigmatism; H hyperopia (farsighted); P presbyopia; Mrx spectacle prescription;  CTL contact lenses; OD right eye; OS left eye; OU both eyes  XT exotropia; ET esotropia; PEK punctate epithelial keratitis; PEE punctate epithelial erosions; DES dry eye syndrome; MGD meibomian gland dysfunction; ATs artificial tears; PFAT's preservative free artificial tears; Harris nuclear sclerotic cataract; PSC posterior subcapsular cataract; ERM epi-retinal membrane; PVD posterior vitreous detachment; RD retinal detachment; DM diabetes mellitus; DR diabetic retinopathy; NPDR non-proliferative diabetic retinopathy; PDR proliferative diabetic retinopathy; CSME clinically significant macular edema; DME diabetic macular edema; dbh dot blot hemorrhages; CWS cotton wool spot; POAG primary open angle glaucoma; C/D cup-to-disc ratio; HVF humphrey visual field; GVF goldmann visual field; OCT  optical coherence tomography; IOP intraocular pressure; BRVO Branch retinal vein occlusion; CRVO central retinal vein occlusion; CRAO central retinal artery occlusion; BRAO branch retinal artery occlusion; RT retinal tear; SB scleral buckle; PPV pars plana vitrectomy; VH Vitreous hemorrhage; PRP panretinal laser photocoagulation; IVK intravitreal kenalog; VMT vitreomacular traction; MH Macular hole;  NVD neovascularization of the disc; NVE neovascularization elsewhere; AREDS age related eye disease study; ARMD age related macular degeneration; POAG primary open angle glaucoma; EBMD epithelial/anterior basement membrane dystrophy; ACIOL anterior chamber intraocular lens; IOL intraocular lens; PCIOL posterior chamber intraocular lens; Phaco/IOL phacoemulsification with intraocular lens placement; [place] photorefractive keratectomy; LASIK laser assisted in situ keratomileusis; HTN hypertension; DM diabetes mellitus; COPD chronic obstructive pulmonary disease

## 2022-11-19 ENCOUNTER — Encounter (INDEPENDENT_AMBULATORY_CARE_PROVIDER_SITE_OTHER): Payer: Self-pay | Admitting: Ophthalmology

## 2022-11-19 ENCOUNTER — Ambulatory Visit (INDEPENDENT_AMBULATORY_CARE_PROVIDER_SITE_OTHER): Payer: Medicare PPO | Admitting: Ophthalmology

## 2022-11-19 DIAGNOSIS — H3509 Other intraretinal microvascular abnormalities: Secondary | ICD-10-CM | POA: Diagnosis not present

## 2022-11-19 DIAGNOSIS — H35033 Hypertensive retinopathy, bilateral: Secondary | ICD-10-CM

## 2022-11-19 DIAGNOSIS — H34832 Tributary (branch) retinal vein occlusion, left eye, with macular edema: Secondary | ICD-10-CM | POA: Diagnosis not present

## 2022-11-19 DIAGNOSIS — H25813 Combined forms of age-related cataract, bilateral: Secondary | ICD-10-CM

## 2022-11-19 DIAGNOSIS — H34231 Retinal artery branch occlusion, right eye: Secondary | ICD-10-CM

## 2022-11-19 DIAGNOSIS — H3581 Retinal edema: Secondary | ICD-10-CM

## 2022-11-19 DIAGNOSIS — I10 Essential (primary) hypertension: Secondary | ICD-10-CM | POA: Diagnosis not present

## 2022-11-19 MED ORDER — BEVACIZUMAB CHEMO INJECTION 1.25MG/0.05ML SYRINGE FOR KALEIDOSCOPE
1.2500 mg | INTRAVITREAL | Status: AC | PRN
  Administered 2022-11-19: 1.25 mg via INTRAVITREAL

## 2022-12-15 NOTE — Progress Notes (Signed)
[facility] Note  12/17/2022     CHIEF COMPLAINT Patient presents for Retina Follow Up   HISTORY OF PRESENT ILLNESS: Shelley Mitchell is a 82 y.o. female who presents to the clinic today for:   HPI     Retina Follow Up   Patient presents with  CRVO/BRVO.  In left eye.  This started 4 weeks ago.  I, the attending physician,  performed the HPI with the patient and updated documentation appropriately.        Comments   Patient here for 4 weeks retina follow up for BRVO OS. Patient states vision a little better. No eye pain.       Last edited by Bernarda Caffey, MD on 12/17/2022 11:39 AM.    Pt states no problems after the injection, her eye was sore the next day  Referring physician: [name], MD [place],  [place] [address]  HISTORICAL INFORMATION:   Selected notes from the MEDICAL RECORD NUMBER Referred by Dr. Herbert Deaner for concern of BDR / PDR LEE:  Ocular Hx- PMH-    CURRENT MEDICATIONS: Current Outpatient Medications (Ophthalmic Drugs)  Medication Sig   latanoprost (XALATAN) 0.005 % ophthalmic solution Place 1 drop into both eyes at bedtime.   No current facility-administered medications for this visit. (Ophthalmic Drugs)   Current Outpatient Medications (Other)  Medication Sig   acetaminophen (TYLENOL) 500 MG tablet Take 100 mg by mouth 2 (two) times daily.   albuterol (VENTOLIN HFA) 108 (90 Base) MCG/ACT inhaler Inhale 2 puffs into the lungs every 8 (eight) hours as needed for wheezing or shortness of breath.   aspirin 81 MG EC tablet Take 81 mg by mouth daily.   docusate sodium (COLACE) 100 MG capsule Take 2 capsules (200 mg total) by mouth 2 (two) times daily.   furosemide (LASIX) 20 MG tablet Take 20 mg by mouth daily.   hydrochlorothiazide (HYDRODIURIL) 25 MG tablet Take 1 tablet (25 mg total) by mouth daily.   lubiprostone (AMITIZA) 8 MCG capsule Take 1 capsule (8 mcg total) by mouth daily with breakfast.    medroxyPROGESTERone (PROVERA) 10 MG tablet TAKE 1 TABLET BY MOUTH DAILY 10 DAYS PER MONTH (Patient taking differently: Take 10 mg by mouth See admin instructions. Take for 10 days per month.)   Multiple Vitamins-Minerals (MULTIVITAMIN WITH MINERALS) tablet Take 1 tablet by mouth daily.   NIFEdipine (PROCARDIA XL/ADALAT-CC) 90 MG 24 hr tablet Take 90 mg by mouth daily.   pravastatin (PRAVACHOL) 40 MG tablet Take 40 mg by mouth daily.   sertraline (ZOLOFT) 100 MG tablet Take 100 mg by mouth daily.   Capsaicin (ARTHRITIS PAIN RELIEF EX) Apply 1 application. topically daily as needed (pain relief). (Patient not taking: Reported on 12/17/2022)   oxyCODONE (OXY IR/ROXICODONE) 5 MG immediate release tablet Take 1 tablet (5 mg total) by mouth every 12 (twelve) hours as needed for breakthrough pain or severe pain. (Patient not taking: Reported on 12/17/2022)   traMADol (ULTRAM) 50 MG tablet Take 1 tablet (50 mg total) by mouth every 6 (six) hours as needed for moderate pain or severe pain. (Patient not taking: Reported on 12/17/2022)   VOLTAREN 1 % GEL Apply 2 g topically 4 (four) times daily as needed (pain). Neck/shoulder (Patient not taking: Reported on 12/17/2022)   No current facility-administered medications for this visit. (Other)   REVIEW OF SYSTEMS: ROS   Positive for: Eyes, Respiratory Last edited by Theodore Demark, COA on  12/17/2022 10:03 AM.      ALLERGIES Allergies  Allergen Reactions   Ace Inhibitors Itching   Nsaids Itching   PAST MEDICAL HISTORY Past Medical History:  Diagnosis Date   Endometrial polyp    Dr. Elonda Husky   History of endometrial hyperplasia    HTN (hypertension)    Hx of adenomatous colonic polyps 1999   Hyperlipidemia    Mental disorder    GAD   Obesity    Osteoarthritis    Tachycardia    subsequently with bradycardia (?secondary to meds)   Past Surgical History:  Procedure Laterality Date   BACK SURGERY     bilateral knee replacements  2002/2003    CHOLECYSTECTOMY     COLONOSCOPY  03/2002   Dr. Jamesetta Geralds internal hemorrhoids   COLONOSCOPY  06/21/2011   polyp at IC valve (tubular adenoma)   COLONOSCOPY N/A 07/15/2016   one 8 mm polyp at hepatic flexure (tubular adenoma), on 12 mm polyp at hepatic flexure (tubular adenoma), 3 year surveillance if health permits   COLONOSCOPY N/A 10/10/2019   descending colon diverticulosis, two 4-5 mm polyps at splenic flexure (tubular adenomas). No surveillance due to age.    LUMBAR LAMINECTOMY/DECOMPRESSION MICRODISCECTOMY N/A 04/27/2022   Procedure: Thoracic Eleven-Twelve Decompression;  Surgeon: Ashok Pall, MD;  Location: [facility];  Service: Neurosurgery;  Laterality: N/A;   POLYPECTOMY  07/15/2016   Procedure: POLYPECTOMY;  Surgeon: Daneil Dolin, MD;  [facility];  Service: Endoscopy;;  Splenic Flexure polyps x 2 removed via hot snare   POLYPECTOMY  10/10/2019   Procedure: POLYPECTOMY;  Surgeon: Daneil Dolin, MD;  Location: [facility];  Service: Endoscopy;;   TUBAL LIGATION     FAMILY HISTORY Family History  Problem Relation Age of Onset   Diabetes Brother    Colon polyps Brother    Diabetes Sister    Cancer Father        Likely prostate   Stroke Mother 33   Colon cancer Neg Hx    SOCIAL HISTORY Social History   Tobacco Use   Smoking status: Never   Smokeless tobacco: Never  Vaping Use   Vaping Use: Never used  Substance Use Topics   Alcohol use: No    Alcohol/week: 0.0 standard drinks of alcohol   Drug use: No       OPHTHALMIC EXAM:  Base Eye Exam     Visual Acuity (Snellen - Linear)       Right Left   Dist cc 20/50 20/150 -1   Dist ph cc 20/40 -1 20/80 -1    Correction: Glasses         Tonometry (Tonopen, 10:01 AM)       Right Left   Pressure 15 11         Pupils       Dark Light Shape React APD   Right 3 2 Round Minimal None   Left 3 2 Round Minimal None         Visual Fields (Counting fingers)       Left Right    Full Full          Extraocular Movement       Right Left    Full, Ortho Full, Ortho         Neuro/Psych     Oriented x3: Yes   Mood/Affect: Normal         Dilation     Both eyes: 1.0% Mydriacyl, 2.5% Phenylephrine @ 10:01  AM           Slit Lamp and Fundus Exam     Slit Lamp Exam       Right Left   Lids/Lashes Dermatochalasis - upper lid Dermatochalasis - upper lid   Conjunctiva/Sclera mild melanosis mild melanosis   Cornea arcus arcus   Anterior Chamber deep and clear deep and clear   Iris Round and dilated Round and dilated   Lens 2+ Nuclear sclerosis, 2+ Cortical cataract 2-3+ Nuclear sclerosis, 2-3+ Cortical cataract   Anterior Vitreous Vitreous syneresis, Posterior vitreous detachment, vitreous condensations Vitreous syneresis, Posterior vitreous detachment         Fundus Exam       Right Left   Disc nasal hyperemia -- improved, +heme, sharp rim, temporal elevation, PPA, mild temporal pallor, peripapillary subretinal heme superior and nasal to disc -- all improved Pink and Sharp, mild tilt, +PPA   C/D Ratio 0.2 0.3   Macula Flat, good foveal reflex, inferior macula atrophic Blunted foveal reflex, central and inferior IRH and exudates -- improved, central cystic changes -- slightly improved, retinal macroanuerysms inferior macula, scattered DBH -- improved   Vessels attenuated, Tortuous attenuated, Tortuous, retinal macroaneurysms along IT arcades   Periphery Attached, peripapillary subretinal heme superior and nasal to disc -- improved, peripheral subretinal heme and exudates temporal periphery -- improved Attached, scattered IRH           Refraction     Wearing Rx       Sphere Cylinder Axis Add   Right -2.00 +0.75 165 +2.00   Left -1.25 +0.50 148 +2.00           IMAGING AND PROCEDURES  Imaging and Procedures for 12/17/2022  OCT, Retina - OU - Both Eyes       Right Eye Quality was good. Central Foveal Thickness: 237. Progression has improved.  Findings include normal foveal contour, no IRF, myopic contour, subretinal fluid, inner retinal atrophy (Diffuse IRA inferior macula / hemisphere -- ?remote BRAO; interval improvement in disc edema, trace cystic changes SN macula -- slightly increased from prior).   Left Eye Quality was good. Central Foveal Thickness: 266. Progression has improved. Findings include abnormal foveal contour, myopic contour, retinal drusen , subretinal hyper-reflective material, intraretinal hyper-reflective material, intraretinal fluid, outer retinal atrophy (Elevation of disc, central ORA, drusen, SRHM, interval improvement in IRF greatest temporal fovea).   Notes *Images captured and stored on drive  Diagnosis / Impression:  OD: Diffuse IRA inferior macula / hemisphere -- ?remote BRAO; interval improvement in disc edema, trace cystic changes SN macula -- slightly increased from prior OS: Elevation of disc, central ORA, drusen, SRHM, interval improvement in IRF greatest temporal fovea  Clinical management:  See below  Abbreviations: NFP - Normal foveal profile. CME - cystoid macular edema. PED - pigment epithelial detachment. IRF - intraretinal fluid. SRF - subretinal fluid. EZ - ellipsoid zone. ERM - epiretinal membrane. ORA - outer retinal atrophy. ORT - outer retinal tubulation. SRHM - subretinal hyper-reflective material. IRHM - intraretinal hyper-reflective material      Intravitreal Injection, Pharmacologic Agent - OS - Left Eye       Time Out 12/17/2022. 10:40 AM. Confirmed correct patient, procedure, site, and patient consented.   Anesthesia Topical anesthesia was used. Anesthetic medications included Lidocaine 2%, Proparacaine 0.5%.   Procedure Preparation included 5% betadine to ocular surface, eyelid speculum. A (32g) needle was used.   Injection: 1.25 mg Bevacizumab 1.'25mg'$ /0.66m   Route: Intravitreal, Site: Left Eye  [place]: [id number], Lot: [id number], Expiration date: 02/04/2023    Post-op Post injection exam found visual acuity of at least counting fingers. The patient tolerated the procedure well. There were no complications. The patient received written and verbal post procedure care education. Post injection medications were not given.            ASSESSMENT/PLAN:    ICD-10-CM   1. 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 Essential hypertension  I10     5. Hypertensive retinopathy of both eyes  H35.033     6. Combined forms of age-related cataract of both eyes  H25.813      1,2. 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 Jeneen Rinks in [place], [place], but now lives in a facility in [place] and cannot be transported to [facility]  - history of laser photoablation of retinal macroaneurysm OS per chart review - FA 12.15.23 shows focal macroaneurysms w/ late leakage - BCVA 20/80 - exam shows macroaneurysm with surrounding circinate exudates and edema, inferior macula OS - OCT shows OS: Elevation of disc, central ORA, drusen, SRHM, interval improvement in IRF greatest temporal fovea - recommend IVA OS #2 today, 01.12.24 - RBA of procedure discussed, questions answered - IVA informed consent obtained and signed, 12.15.23 (OS) - see procedure note - F/U 4 weeks -- DFE/OCT/possible injection  3. BRAO OD  - OCT shows significant inner retinal atrophy in inferior macula and hemisphere -- consistent with remote inferior BRAO  - exam shows peripapillary hemorrhage, mild NVD nasal disc -- improving  - FA 12.15.23 shows mild NVD nasal disc w/ leakage  - pt may benefit from PRP and anti-VEGF therapy  4,5. Hypertensive retinopathy OU - discussed  importance of tight BP control - monitor  6. Mixed Cataract OU - The symptoms of cataract, surgical options, and treatments and risks were discussed with patient. - discussed diagnosis and progression - likely visually significant - monitor for now, but will refer for cataract evaluation once retinas are more stable  Ophthalmic Meds Ordered this visit:  Meds ordered this encounter  Medications   Bevacizumab (AVASTIN) SOLN 1.25 mg     Return in about 4 weeks (around 01/14/2023) for f/u BRVO OS, DFE, OCT, possible injxn.  There are no Patient Instructions on file for this visit.   Explained the diagnoses, plan, and follow up with the patient and they expressed understanding.  Patient expressed understanding of the importance of proper follow up care.   This document serves as a record of services personally performed by Gardiner Sleeper, MD, PhD. It was created on their behalf by Roselee Nova, COMT. The creation of this record is the provider's dictation and/or activities during the visit.  Electronically signed by: Roselee Nova, COMT 12/17/22 11:40 AM  This document serves as a record of services personally performed by Gardiner Sleeper, MD, PhD. It was created on their behalf by San Jetty. Owens Shark, OA an ophthalmic technician. The creation of this record is the provider's dictation and/or activities during the visit.    Electronically signed by: San Jetty. Owens Shark, [place] 01.12.2024  11:40 AM  Gardiner Sleeper, M.D., Ph.D. Diseases & Surgery of the Retina and Vitreous [facility]  I have reviewed the above documentation for accuracy and completeness, and I agree with the above. Gardiner Sleeper, M.D., Ph.D. 12/17/22 11:56 AM   Abbreviations: M myopia (nearsighted); A astigmatism; H hyperopia (farsighted); P presbyopia; Mrx spectacle prescription;  CTL contact lenses; OD right eye; OS left eye; OU both eyes  XT exotropia; ET esotropia; PEK punctate epithelial keratitis; PEE  punctate epithelial erosions; DES dry eye syndrome; MGD meibomian gland dysfunction; ATs artificial tears; PFAT's preservative free artificial tears; [place] nuclear sclerotic cataract; PSC posterior subcapsular cataract; ERM epi-retinal membrane; PVD posterior vitreous detachment; RD retinal detachment; DM diabetes mellitus; DR diabetic retinopathy; NPDR non-proliferative diabetic retinopathy; PDR proliferative diabetic retinopathy; CSME clinically significant macular edema; DME diabetic macular edema; dbh dot blot hemorrhages; CWS cotton wool spot; POAG primary open angle glaucoma; C/D cup-to-disc ratio; HVF humphrey visual field; GVF goldmann visual field; OCT optical coherence tomography; IOP intraocular pressure; BRVO Branch retinal vein occlusion; CRVO central retinal vein occlusion; CRAO central retinal artery occlusion; BRAO branch retinal artery occlusion; RT retinal tear; SB scleral buckle; PPV pars plana vitrectomy; VH Vitreous hemorrhage; PRP panretinal laser photocoagulation; IVK intravitreal kenalog; VMT vitreomacular traction; MH Macular hole;  NVD neovascularization of the disc; NVE neovascularization elsewhere; AREDS age related eye disease study; ARMD age related macular degeneration; POAG primary open angle glaucoma; EBMD epithelial/anterior basement membrane dystrophy; ACIOL anterior chamber intraocular lens; IOL intraocular lens; PCIOL posterior chamber intraocular lens; Phaco/IOL phacoemulsification with intraocular lens placement; [place] photorefractive keratectomy; LASIK laser assisted in situ keratomileusis; HTN hypertension; DM diabetes mellitus; COPD chronic obstructive pulmonary disease

## 2022-12-17 ENCOUNTER — Ambulatory Visit (INDEPENDENT_AMBULATORY_CARE_PROVIDER_SITE_OTHER): Payer: Medicare PPO | Admitting: Ophthalmology

## 2022-12-17 ENCOUNTER — Encounter (INDEPENDENT_AMBULATORY_CARE_PROVIDER_SITE_OTHER): Payer: Self-pay | Admitting: Ophthalmology

## 2022-12-17 DIAGNOSIS — H34231 Retinal artery branch occlusion, right eye: Secondary | ICD-10-CM

## 2022-12-17 DIAGNOSIS — H34832 Tributary (branch) retinal vein occlusion, left eye, with macular edema: Secondary | ICD-10-CM | POA: Diagnosis not present

## 2022-12-17 DIAGNOSIS — H35033 Hypertensive retinopathy, bilateral: Secondary | ICD-10-CM | POA: Diagnosis not present

## 2022-12-17 DIAGNOSIS — H3509 Other intraretinal microvascular abnormalities: Secondary | ICD-10-CM

## 2022-12-17 DIAGNOSIS — I10 Essential (primary) hypertension: Secondary | ICD-10-CM

## 2022-12-17 DIAGNOSIS — H25813 Combined forms of age-related cataract, bilateral: Secondary | ICD-10-CM

## 2022-12-17 MED ORDER — BEVACIZUMAB CHEMO INJECTION 1.25MG/0.05ML SYRINGE FOR KALEIDOSCOPE
1.2500 mg | INTRAVITREAL | Status: AC | PRN
  Administered 2022-12-17: 1.25 mg via INTRAVITREAL

## 2022-12-23 ENCOUNTER — Emergency Department (HOSPITAL_COMMUNITY): Payer: Medicare PPO

## 2022-12-23 ENCOUNTER — Observation Stay (HOSPITAL_COMMUNITY)
Admission: EM | Admit: 2022-12-23 | Discharge: 2022-12-24 | Disposition: A | Discharge status: Home / Self Care | Payer: Medicare PPO | Attending: Internal Medicine | Admitting: Internal Medicine

## 2022-12-23 ENCOUNTER — Other Ambulatory Visit: Payer: Self-pay

## 2022-12-23 DIAGNOSIS — F418 Other specified anxiety disorders: Secondary | ICD-10-CM | POA: Diagnosis not present

## 2022-12-23 DIAGNOSIS — R001 Bradycardia, unspecified: Secondary | ICD-10-CM | POA: Diagnosis present

## 2022-12-23 DIAGNOSIS — Z7982 Long term (current) use of aspirin: Secondary | ICD-10-CM | POA: Diagnosis not present

## 2022-12-23 DIAGNOSIS — H409 Unspecified glaucoma: Secondary | ICD-10-CM | POA: Diagnosis not present

## 2022-12-23 DIAGNOSIS — Z79899 Other long term (current) drug therapy: Secondary | ICD-10-CM | POA: Insufficient documentation

## 2022-12-23 DIAGNOSIS — K5900 Constipation, unspecified: Secondary | ICD-10-CM | POA: Diagnosis not present

## 2022-12-23 DIAGNOSIS — I10 Essential (primary) hypertension: Secondary | ICD-10-CM | POA: Insufficient documentation

## 2022-12-23 DIAGNOSIS — R42 Dizziness and giddiness: Secondary | ICD-10-CM | POA: Insufficient documentation

## 2022-12-23 DIAGNOSIS — I440 Atrioventricular block, first degree: Secondary | ICD-10-CM | POA: Diagnosis not present

## 2022-12-23 LAB — CBC WITH DIFFERENTIAL/PLATELET
Abs Immature Granulocytes: 0.01 10*3/uL (ref 0.00–0.07)
Basophils Absolute: 0 10*3/uL (ref 0.0–0.1)
Basophils Relative: 1 %
Eosinophils Absolute: 0.2 10*3/uL (ref 0.0–0.5)
Eosinophils Relative: 3 %
HCT: 36.1 % (ref 36.0–46.0)
Hemoglobin: 11.8 g/dL — ABNORMAL LOW (ref 12.0–15.0)
Immature Granulocytes: 0 %
Lymphocytes Relative: 29 %
Lymphs Abs: 1.7 10*3/uL (ref 0.7–4.0)
MCH: 27.8 pg (ref 26.0–34.0)
MCHC: 32.7 g/dL (ref 30.0–36.0)
MCV: 84.9 fL (ref 80.0–100.0)
Monocytes Absolute: 0.7 10*3/uL (ref 0.1–1.0)
Monocytes Relative: 12 %
Neutro Abs: 3.2 10*3/uL (ref 1.7–7.7)
Neutrophils Relative %: 55 %
Platelets: 203 10*3/uL (ref 150–400)
RBC: 4.25 MIL/uL (ref 3.87–5.11)
RDW: 16.3 % — ABNORMAL HIGH (ref 11.5–15.5)
WBC: 5.8 10*3/uL (ref 4.0–10.5)
nRBC: 0 % (ref 0.0–0.2)

## 2022-12-23 LAB — URINALYSIS, ROUTINE W REFLEX MICROSCOPIC
Bilirubin Urine: NEGATIVE
Glucose, UA: NEGATIVE mg/dL
Hgb urine dipstick: NEGATIVE
Ketones, ur: NEGATIVE mg/dL
Leukocytes,Ua: NEGATIVE
Nitrite: NEGATIVE
Protein, ur: NEGATIVE mg/dL
Specific Gravity, Urine: 1.011 (ref 1.005–1.030)
pH: 6 (ref 5.0–8.0)

## 2022-12-23 LAB — BASIC METABOLIC PANEL
Anion gap: 7 (ref 5–15)
BUN: 20 mg/dL (ref 8–23)
CO2: 29 mmol/L (ref 22–32)
Calcium: 9.1 mg/dL (ref 8.9–10.3)
Chloride: 103 mmol/L (ref 98–111)
Creatinine, Ser: 0.65 mg/dL (ref 0.44–1.00)
GFR, Estimated: >60 mL/min (ref >60)
Glucose, Bld: 100 mg/dL — ABNORMAL HIGH (ref 70–99)
Potassium: 3.8 mmol/L (ref 3.5–5.1)
Sodium: 139 mmol/L (ref 135–145)

## 2022-12-23 LAB — MAGNESIUM: Magnesium: 2.3 mg/dL (ref 1.7–2.4)

## 2022-12-23 LAB — TROPONIN I (HIGH SENSITIVITY): Troponin I (High Sensitivity): 10 ng/L (ref <18)

## 2022-12-23 LAB — TSH: TSH: 2.994 u[IU]/mL (ref 0.350–4.500)

## 2022-12-23 MED ORDER — ALBUTEROL SULFATE (2.5 MG/3ML) 0.083% IN NEBU: 2.5000 mg | INHALATION_SOLUTION | Freq: Three times a day (TID) | RESPIRATORY_TRACT | Status: DC | PRN

## 2022-12-23 MED ORDER — SENNOSIDES-DOCUSATE SODIUM 8.6-50 MG PO TABS
1.0000 | ORAL_TABLET | Freq: Every day | ORAL | Status: DC
  Administered 2022-12-23: 1 via ORAL
  Filled 2022-12-23: qty 1

## 2022-12-23 MED ORDER — LATANOPROST 0.005 % OP SOLN
1.0000 [drp] | Freq: Every day | OPHTHALMIC | Status: DC
  Administered 2022-12-23: 1 [drp] via OPHTHALMIC
  Filled 2022-12-23: qty 2.5

## 2022-12-23 MED ORDER — PRAVASTATIN SODIUM 40 MG PO TABS
40.0000 mg | ORAL_TABLET | Freq: Every day | ORAL | Status: DC
  Administered 2022-12-23 – 2022-12-24 (×2): 40 mg via ORAL
  Filled 2022-12-23 (×2): qty 1

## 2022-12-23 MED ORDER — POLYETHYLENE GLYCOL 3350 17 G PO PACK
17.0000 g | PACK | Freq: Every day | ORAL | Status: DC
  Administered 2022-12-23 – 2022-12-24 (×2): 17 g via ORAL
  Filled 2022-12-23 (×2): qty 1

## 2022-12-23 MED ORDER — ENOXAPARIN SODIUM 40 MG/0.4ML IJ SOSY
40.0000 mg | PREFILLED_SYRINGE | INTRAMUSCULAR | Status: DC
  Administered 2022-12-23: 40 mg via SUBCUTANEOUS
  Filled 2022-12-23: qty 0.4

## 2022-12-23 MED ORDER — ACETAMINOPHEN 500 MG PO TABS
1000.0000 mg | ORAL_TABLET | Freq: Three times a day (TID) | ORAL | Status: DC | PRN
  Administered 2022-12-23 – 2022-12-24 (×2): 1000 mg via ORAL
  Filled 2022-12-23 (×2): qty 2

## 2022-12-23 MED ORDER — SERTRALINE HCL 100 MG PO TABS
100.0000 mg | ORAL_TABLET | Freq: Every day | ORAL | Status: DC
  Administered 2022-12-23 – 2022-12-24 (×2): 100 mg via ORAL
  Filled 2022-12-23 (×2): qty 1

## 2022-12-23 NOTE — ED Notes (Signed)
Family at bedside. 

## 2022-12-23 NOTE — ED Provider Notes (Signed)
[facility] EMERGENCY DEPARTMENT Provider Note   CSN: [id number] Arrival date & time: 12/23/22  1405     History  Chief Complaint  Patient presents with   Bradycardia   Hypertension    Per EMS pt hypertensive BP 190/88 and HR 38 pt reports dizzy    Shelley Mitchell is a 82 y.o. female.  82 year old female brought in by EMS from facility with concern for elevated blood pressure readings for the past 3 days.  Facility has been treating with 3 times daily clonidine, states blood pressure will improve from 892J systolics however bounces right back up.  States she feels tired today and if she turns her head she feels a little bit dizzy.  She denies shortness of breath, chest pain, unilateral weakness or numbness.  Does state that she is in rehab because she has chronic pain in both of her knees and is nonambulatory.  States that she has some right leg weakness secondary to this. On arrival, blood pressure 116/94.  EMS found patient to be bradycardic with heart rates in the 40s.  Patient reports that she has had low heart rates previously.       Home Medications Prior to Admission medications   Medication Sig Start Date End Date Taking? Authorizing Provider  acetaminophen (TYLENOL) 500 MG tablet Take 100 mg by mouth 2 (two) times daily.    [provider]  albuterol (VENTOLIN HFA) 108 (90 Base) MCG/ACT inhaler Inhale 2 puffs into the lungs every 8 (eight) hours as needed for wheezing or shortness of breath.    [provider]  aspirin 81 MG EC tablet Take 81 mg by mouth daily.    [provider]  Capsaicin (ARTHRITIS PAIN RELIEF EX) Apply 1 application. topically daily as needed (pain relief). Patient not taking: Reported on 12/17/2022    [provider]  docusate sodium (COLACE) 100 MG capsule Take 2 capsules (200 mg total) by mouth 2 (two) times daily. 04/30/22   Thurnell Lose, MD  furosemide (LASIX) 20 MG tablet Take 20 mg by mouth  daily.    [provider]  hydrochlorothiazide (HYDRODIURIL) 25 MG tablet Take 1 tablet (25 mg total) by mouth daily. 04/30/22   Thurnell Lose, MD  latanoprost (XALATAN) 0.005 % ophthalmic solution Place 1 drop into both eyes at bedtime. 01/08/21   [provider]  lubiprostone (AMITIZA) 8 MCG capsule Take 1 capsule (8 mcg total) by mouth daily with breakfast. 12/13/19   Annitta Needs, NP  medroxyPROGESTERone (PROVERA) 10 MG tablet TAKE 1 TABLET BY MOUTH DAILY 10 DAYS PER MONTH Patient taking differently: Take 10 mg by mouth See admin instructions. Take for 10 days per month. 01/13/22   Florian Buff, MD  Multiple Vitamins-Minerals (MULTIVITAMIN WITH MINERALS) tablet Take 1 tablet by mouth daily.    [provider]  NIFEdipine (PROCARDIA XL/ADALAT-CC) 90 MG 24 hr tablet Take 90 mg by mouth daily. 05/12/11   [provider]  oxyCODONE (OXY IR/ROXICODONE) 5 MG immediate release tablet Take 1 tablet (5 mg total) by mouth every 12 (twelve) hours as needed for breakthrough pain or severe pain. Patient not taking: Reported on 12/17/2022 04/30/22   Thurnell Lose, MD  pravastatin (PRAVACHOL) 40 MG tablet Take 40 mg by mouth daily. 05/12/11   [provider]  sertraline (ZOLOFT) 100 MG tablet Take 100 mg by mouth daily.    [provider]  traMADol (ULTRAM) 50 MG tablet Take 1 tablet (  50 mg total) by mouth every 6 (six) hours as needed for moderate pain or severe pain. Patient not taking: Reported on 12/17/2022 04/30/22   Thurnell Lose, MD  VOLTAREN 1 % GEL Apply 2 g topically 4 (four) times daily as needed (pain). Neck/shoulder Patient not taking: Reported on 12/17/2022 01/20/21   [provider]      Allergies    Ace inhibitors and Nsaids    Review of Systems   Review of Systems Negative except as per HPI Physical Exam Updated Vital Signs BP (!) 116/94 (BP Location: Right Arm)   Pulse (!) 43   Temp 97.8 F (36.6 C) (Oral)   Resp 15    Ht '5\' 2"'$  (1.575 m)   Wt 109.8 kg   SpO2 99%   BMI 44.26 kg/m  Physical Exam Vitals and nursing note reviewed.  Constitutional:      General: She is not in acute distress.    Appearance: She is well-developed. She is obese. She is not diaphoretic.  HENT:     Head: Normocephalic and atraumatic.  Cardiovascular:     Rate and Rhythm: Regular rhythm. Bradycardia present.     Heart sounds: Normal heart sounds.  Pulmonary:     Effort: Pulmonary effort is normal.     Breath sounds: Normal breath sounds.  Abdominal:     Palpations: Abdomen is soft.     Tenderness: There is no abdominal tenderness.  Musculoskeletal:     Right lower leg: Edema present.     Left lower leg: Edema present.     Comments: Minimal bilateral lower extremity edema  Skin:    General: Skin is warm and dry.     Findings: No erythema or rash.  Neurological:     Mental Status: She is alert and oriented to person, place, and time.     Sensory: No sensory deficit.     Comments: Right leg weakness, reports this to be baseline for her.  Able to extend and hold left leg and wrist gravity.  Psychiatric:        Behavior: Behavior normal.     ED Results / Procedures / Treatments   Labs (all labs ordered are listed, but only abnormal results are displayed) Labs Reviewed - No data to display  EKG EKG Interpretation  Date/Time:  Thursday December 23 2022 14:11:17 EST Ventricular Rate:  44 PR Interval:  268 QRS Duration: 104 QT Interval:  506 QTC Calculation: 433 R Axis:   4 Text Interpretation: Sinus bradycardia Prolonged PR interval Low voltage, precordial leads Confirmed by Margaretmary Eddy [phone]) on 12/23/2022 2:12:15 PM  Radiology No results found.  Procedures Procedures    Medications Ordered in ED Medications - No data to display  ED Course/ Medical Decision Making/ A&P Clinical Course as of 12/23/22 1748  Thu Dec 23, 2022  1448 New. Here from SNF. C/o of HTN. Treating with TID clonidine. HX of  tachycardia. Normotensive here. Chronic pain in knees. Normal RLE weakness.  [JS]    Clinical Course User Index [JS] Donzetta Matters, MD                             Medical Decision Making Amount and/or Complexity of Data Reviewed Labs: ordered. Radiology: ordered.   This patient presents to the ED for concern of fatigue, hypertension at facility, this involves an extensive number of treatment options, and is a complaint that carries with it a high  risk of complications and morbidity.  The differential diagnosis includes but not limited to rebound HTN/bradycardia from clonidine, electrolyte disturbance, hypertensive emergency, CVA,   Co morbidities that complicate the patient evaluation  Hypertension, history of tachycardia subsequently with bradycardia questionably secondary to meds.  Hyperlipidemia   Additional history obtained:  Additional history obtained from EMS who provides history as above External records from outside source obtained and reviewed including dc summary dated 04/23/22 following admission from 04/23/22-04/30/22 admitted for left lower extremity weakness, MRI suggesting canal stenosis T11-T12.    Lab Tests:  I Ordered, and personally interpreted labs.  The pertinent results include:  labs pending at time of sign out to on coming provider   Imaging Studies ordered:  I ordered imaging studies including CXR  I independently visualized and interpreted imaging which showed pending at time of sign out to oncoming provider.  I agree with the radiologist interpretation   Cardiac Monitoring: / EKG:  The patient was maintained on a cardiac monitor.  I personally viewed and interpreted the cardiac monitored which showed an underlying rhythm of: sinus brady, rate 44   Problem List / ED Course / Critical interventions / Medication management  82 yo female brought in by EMS from facility for uncontrolled HTN as above. On arrival, BP 116/90s, c/o fatigue and dizziness  with turning head only. Work up to include head CT for dizziness with recent elevated Bps, CXR, EKG, basic labs. Consider brady cardia possibly due to clonidine vs history of bradycardia related to medicaitons (per history on file). Work up and disposition pending at time of sign out to oncoming provider.   I have reviewed the patients home medicines and have made adjustments as needed   Social Determinants of Health:  [facility]   Test / Admission - Considered:  Disposition pending at time of sign out to oncoming provider.          Final Clinical Impression(s) / ED Diagnoses Final diagnoses:  None    Rx / DC Orders ED Discharge Orders     None         Tacy Learn, PA-C 12/23/22 1754    Fransico Meadow, MD 12/24/22 [phone]

## 2022-12-23 NOTE — H&P (Addendum)
Date: 12/23/2022               Patient Name:  Shelley Mitchell MRN: [id number]  DOB: 10-24-1941 Age / Sex: 82 y.o., female   PCP: [name], MD         Medical Service: Internal Medicine Teaching Service         Attending Physician: Dr. Aldine Contes, MD      First Contact: Dr. Nani Gasser, MD Pager [phone]    Second Contact: Dr. Buddy Duty, DO Pager [phone]         After Hours (After 5p/  First Contact Pager: [phone]  weekends / holidays): Second Contact Pager: [phone]   SUBJECTIVE   Chief Complaint: Bradycardia   History of Present Illness: This is a a 82 year female with a past medical history of HTN, HLD, chronic pain due to OA, who presented to the emergency after being sent from her facility for bradycardia.  Patient has been staying at [facility] since May 2023 after getting back surgery.  Patient is there for rehab.  At time of discharge in 04/2022 from previous admission, patient was discontinued on her hydralazine, losartan, and spironolactone.  For the past week, patient has developed worsening hypertension, with systolics in the 939Q.  The facility started giving the patient clonidine, and blood pressure did come back down.  Unclear about how much clonidine patient did get. Patient reports that 2 days ago she did have some chest pain that lasted about 1 minute that was a sharp pain that started in her left chest, and she stated that it radiated to her right chest. This occurred with movement.  She denies any associated shortness of breath or diaphoreses at the time. She states that she did not have any other episodes like this and she attributed this to gas. She also reports that for the past couple days she has been more short of breath with rolling around, as she is nonambulatory and uses a wheelchair to get around. She states that at rest she has no shortness of breath. She she did report having a slight right-sided headache. She was dizzy today and found to have a  lower heart rate than usually and was brought to the ED for more workup.  When I come speak to patient, patient states that she is not dizzy anymore, and states she is feeling fine.  She denies any shortness of breath on my exam.  She denies any abdominal pain.  She denies any acute vision changes.  She does report that she has some constipation.  She does endorse chronic right lower extremity and right upper extremity pain and weakness.  Of note, called facility, but they cannot confirm how many times patient had received clonidine because the computer system is down. They can state that she got a dose yesterday. They will give me a call back.  ED Course: Patient presented to the emergency room via ambulance, and in the ambulance, heart rate was 38 and blood pressure was 190/88.  When patient arrived to emergency department, temperature was 97.8 F, heart rate 44, blood pressure 149/84 with oxygen saturation at 97% on room air.  Patient was initially complaining of dizziness, but that resolved.  Initial labs mostly unremarkable except hemoglobin slightly low at 11.8.  Initial imaging showed no acute cardiopulmonary or intracranial processes.  There was moderate cortical atrophy and chronic ischemic white matter changes similar to prior CT head as well as remote right orbital floor fracture,  but nothing acute. EKG consistent with 1st degree AV block.  Patient did not receive any medications in ED, and remained bradycardic.  EDP was concerned about possible clonidine playing a role in bradycardia, and IMTS consulted for admission overnight.  Past Medical History Past Medical History:  Diagnosis Date   Endometrial polyp    Dr. Elonda Husky   History of endometrial hyperplasia    HTN (hypertension)    Hx of adenomatous colonic polyps 1999   Hyperlipidemia    Mental disorder    GAD   Obesity    Osteoarthritis    Tachycardia    subsequently with bradycardia (?secondary to meds)     Meds:  Asprin 81 mg  daily  Medroxyprogesterone 10 mg daily  Miralax daily Nifedipine 90 mg daily Pravastain 40 mg daily Sertraline 100 mg daily Clonidin 0.1 mg daily Ventolin inhaler as needed HCTZ 25 mg daily Lasix 20 mg qd daily Latanoprost ophthalmic solution 1 drop both eyes nightly Lubiprostone 8 mcg daily Bevacizumab solution injections at ophthalmology   Past Surgical History  Past Surgical History:  Procedure Laterality Date   BACK SURGERY     bilateral knee replacements  2002/2003   CHOLECYSTECTOMY     COLONOSCOPY  03/2002   Dr. Jamesetta Geralds internal hemorrhoids   COLONOSCOPY  06/21/2011   polyp at IC valve (tubular adenoma)   COLONOSCOPY N/A 07/15/2016   one 8 mm polyp at hepatic flexure (tubular adenoma), on 12 mm polyp at hepatic flexure (tubular adenoma), 3 year surveillance if health permits   COLONOSCOPY N/A 10/10/2019   descending colon diverticulosis, two 4-5 mm polyps at splenic flexure (tubular adenomas). No surveillance due to age.    LUMBAR LAMINECTOMY/DECOMPRESSION MICRODISCECTOMY N/A 04/27/2022   Procedure: Thoracic Eleven-Twelve Decompression;  Surgeon: Ashok Pall, MD;  Location: [place];  Service: Neurosurgery;  Laterality: N/A;   POLYPECTOMY  07/15/2016   Procedure: POLYPECTOMY;  Surgeon: Daneil Dolin, MD;  [facility];  Service: Endoscopy;;  Splenic Flexure polyps x 2 removed via hot snare   POLYPECTOMY  10/10/2019   Procedure: POLYPECTOMY;  Surgeon: Daneil Dolin, MD;  Location: [facility];  Service: Endoscopy;;   TUBAL LIGATION      Social:  Lives With: At [facility] Occupation: Retired Systems analyst Support: Makes her own decisions Level of Function: Dependent in all ADLs and IADLs PCP: Dr. [name], MD Substances: No substance use  Family History: No pertinent family history  Allergies: Allergies as of 12/23/2022 - Review Complete 12/23/2022  Allergen Reaction Noted   Ace inhibitors Itching 03/17/2018   Nsaids Itching 03/17/2018     Review of Systems: A complete ROS was negative except as per HPI.   OBJECTIVE:   Physical Exam: Blood pressure (!) 134/57, pulse (!) 45, temperature 98.7 F (37.1 C), temperature source Oral, resp. rate 19, height '5\' 2"'$  (1.575 m), weight 109.8 kg, SpO2 100 %.  Constitutional: Resting in bed in no acute distress HENT: normocephalic atraumatic Eyes: conjunctiva non-erythematous, pupils equal and reactive to light Cardiovascular: Bradycardic, no murmur, rubs, or gallops noted Pulmonary/Chest: normal work of breathing on room air, lungs clear to auscultation bilaterally Abdominal: soft, non-tender, non-distended MSK: normal bulk and tone Extremities: Slightly swollen, with no pitting edema noted Neurological: 3/5 strength to right upper and lower extremity.  5/5 strength to left upper and lower extremity.  Sensation intact.  Finger-nose intact.  Cranial nerves II through XII intact.  Pupils equal and reactive. Skin: warm and dry  Labs: CBC  Component Value Date/Time   WBC 5.8 12/23/2022 1414   RBC 4.25 12/23/2022 1414   HGB 11.8 (L) 12/23/2022 1414   HCT 36.1 12/23/2022 1414   PLT 203 12/23/2022 1414   MCV 84.9 12/23/2022 1414   MCH 27.8 12/23/2022 1414   MCHC 32.7 12/23/2022 1414   RDW 16.3 (H) 12/23/2022 1414   LYMPHSABS 1.7 12/23/2022 1414   MONOABS 0.7 12/23/2022 1414   EOSABS 0.2 12/23/2022 1414   BASOSABS 0.0 12/23/2022 1414     CMP     Component Value Date/Time   NA 139 12/23/2022 1414   K 3.8 12/23/2022 1414   CL 103 12/23/2022 1414   CO2 29 12/23/2022 1414   GLUCOSE 100 (H) 12/23/2022 1414   BUN 20 12/23/2022 1414   CREATININE 0.65 12/23/2022 1414   CALCIUM 9.1 12/23/2022 1414   PROT 5.9 (L) 04/30/2022 0140   ALBUMIN 2.4 (L) 04/30/2022 0140   AST 44 (H) 04/30/2022 0140   ALT 55 (H) 04/30/2022 0140   ALKPHOS 71 04/30/2022 0140   BILITOT 0.3 04/30/2022 0140   GFRNONAA >60 12/23/2022 1414    Imaging:  Chest x-ray: No acute cardiopulmonary  process  CT head: No acute intracranial findings.  Moderate cortical atrophy and chronic ischemic white matter changes, similar to prior CT head.  Remote right orbital floor fracture with downward herniation of intraorbital fat.  EKG: personally reviewed my interpretation is sinus bradycardia with a rate of 42.  There is a prolonged PR interval consistent with first-degree AV block.No acute ST segment changes.  When compared to previous ECG, new ECG has longer PR interval, and is more bradycardic.  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 Patient admitted for further evaluation and management of symptomatic bradycardia.    #Symptomatic bradycardia #First-degree AV block Patient presented with concerns for bradycardia with lowest recorded heart rate of 38.  Patient has a history of decreased heart rate, and looks like patient has history of first-degree AV block as well.  Her heart rate usually is around the 50s to 60s.  During her ED course, patient's heart rate remained in the 40s.  EKG did show patient had first-degree AV block.  Differential can include hypothyroidism, new ischemic event causing bradycardia, medication induced, or AV block.  Thyroid has been ruled out.  Less likely new ischemic event as trop was normal.  AV block has been known and confirmed on EKG today.  Only thing that has changed recently, is that she has started taking clonidine.  Unclear how often she has been taking this.  Clonidine can cause bradycardia.  On my exam, patient's blood pressure was at 148/46.  Heart rate was 44.  She did not have any symptoms.  Will allow time for clonidine to washout, and see how patient's heart rate responds. If she remains bradycardic and is symptomatic  she may need cardiology evaluation for a pacer.  -Monitor heart rate on telemetry -Given patient is asymptomatic, do not see indication for atropine at this time -If patient does become more symptomatic, please page physician -Monitor blood pressure -Obtain echocardiogram -Hold home antihypertensives   #Hypertension Patient's current blood pressure at this time is 148/46.  Patient did not receive any antihypertensive during her ED  course.  Will hold her home medications at this time.  Her home anti hypertensives include Nifedipine 90 mg daily, HCTZ 25 mg daily.  Patient was getting clonidine at facility, will not give more clonidine here. Will need to keep eye out for rebound hypertension.  -Hold home medications -Monitor blood pressure closely  #Hyperlipidemia Patient has a past medical history of hyperlipidemia.  Patient takes pravastatin 40 mg daily. -Resume home pravastatin 40 mg daily  #Glaucoma Patient has a past medical history of glaucoma, and takes latanoprost solution eyedrops nightly. -Resume home eyedrops  #Anxiety/depression Patient with past medical history of anxiety and depression.  Patient is on sertraline 100 mg daily. -Resume home sertraline  #Constipation Patient reports having constipation, and has not had a good bowel movement in the past couple days.  Will order bowel regimen for patient. -Start MiraLAX daily -Start Senokot S daily  #OSA -CPAP nightly   Diet: Normal VTE: Enoxaparin IVF: None,None Code: Full  Prior to Admission Living Arrangement:  [place] Anticipated Discharge Location:  [facility] Barriers to Discharge: Clinical improvement   Dispo: Admit patient to Observation with expected length of stay less than 2 midnights.  Signed: Leigh Aurora, DO Internal Medicine Resident PGY-1 Pager: [phone] 12/23/2022, 9:18 PM

## 2022-12-23 NOTE — ED Triage Notes (Signed)
Pt reports being dizzy. EMS reports HR in 38 and BP 190/88

## 2022-12-23 NOTE — ED Provider Notes (Signed)
  Physical Exam  BP (!) 144/53   Pulse (!) 42   Temp 97.6 F (36.4 C) (Oral)   Resp 18   Ht '5\' 2"'$  (1.575 m)   Wt 109.8 kg   SpO2 99%   BMI 44.26 kg/m   Physical Exam Vitals and nursing note reviewed.  Constitutional:      General: She is not in acute distress.    Appearance: She is well-developed.  HENT:     Head: Normocephalic and atraumatic.     Mouth/Throat:     Mouth: Mucous membranes are moist.  Eyes:     Conjunctiva/sclera: Conjunctivae normal.  Cardiovascular:     Rate and Rhythm: Normal rate and regular rhythm.     Heart sounds: No murmur heard. Pulmonary:     Effort: Pulmonary effort is normal. No respiratory distress.     Breath sounds: Normal breath sounds.  Abdominal:     Palpations: Abdomen is soft.     Tenderness: There is no abdominal tenderness.  Musculoskeletal:        General: No swelling.     Cervical back: Neck supple.     Right lower leg: Edema present.     Left lower leg: Edema present.  Skin:    General: Skin is warm and dry.     Capillary Refill: Capillary refill takes less than 2 seconds.  Neurological:     General: No focal deficit present.     Mental Status: She is alert and oriented to person, place, and time. Mental status is at baseline.  Psychiatric:        Mood and Affect: Mood normal.     Procedures  Procedures  ED Course / MDM   Clinical Course as of 12/24/22 0009  Thu Dec 23, 2022  1448 New. Here from SNF. C/o of HTN. Treating with TID clonidine. HX of tachycardia. Normotensive here. Chronic pain in knees. Normal RLE weakness.  [JS]    Clinical Course User Index [JS] Donzetta Matters, MD   Medical Decision Making  On assuming care of this patient.  She is resting comfortably in bed no acute distress.  On review of EKG patient marked interval prolonged patient persistently bradycardic with rates in the mid 40s labs unremarkable with no elevation in troponin or BNP electrolytes grossly within normal limits no abnormalities  in TSH level.  Suspect clonidine overdose however given her symptomatic bradycardia plan is to admit the patient to the hospitalist service for further management including continuous telemetry as well as potential echocardiogram and further cardiac workup should patient's symptoms not improve overnight.  Problems Addressed: Bradycardia: undiagnosed new problem with uncertain prognosis  Amount and/or Complexity of Data Reviewed Labs: ordered. Radiology: ordered.  Risk Decision regarding hospitalization.          Donzetta Matters, MD 12/24/22 0009    Tegeler, Gwenyth Allegra, MD 12/24/22 [phone]

## 2022-12-23 NOTE — ED Notes (Signed)
Family at bedside. Pt incontinent of urine, changed and placed on a purewick. Call light within reach

## 2022-12-23 NOTE — Hospital Course (Addendum)
Principal Problem:   Symptomatic bradycardia  Resolved Problems:   * No resolved hospital problems. *  Consults:***  Procedures:***  Follow-up items:***

## 2022-12-24 ENCOUNTER — Observation Stay (HOSPITAL_BASED_OUTPATIENT_CLINIC_OR_DEPARTMENT_OTHER): Payer: Medicare PPO

## 2022-12-24 DIAGNOSIS — R001 Bradycardia, unspecified: Secondary | ICD-10-CM | POA: Diagnosis not present

## 2022-12-24 DIAGNOSIS — R42 Dizziness and giddiness: Secondary | ICD-10-CM | POA: Diagnosis not present

## 2022-12-24 LAB — CBC
HCT: 38 % (ref 36.0–46.0)
Hemoglobin: 12.2 g/dL (ref 12.0–15.0)
MCH: 27.9 pg (ref 26.0–34.0)
MCHC: 32.1 g/dL (ref 30.0–36.0)
MCV: 87 fL (ref 80.0–100.0)
Platelets: 180 10*3/uL (ref 150–400)
RBC: 4.37 MIL/uL (ref 3.87–5.11)
RDW: 16.2 % — ABNORMAL HIGH (ref 11.5–15.5)
WBC: 7.8 10*3/uL (ref 4.0–10.5)
nRBC: 0 % (ref 0.0–0.2)

## 2022-12-24 LAB — ECHOCARDIOGRAM COMPLETE
Area-P 1/2: 2.52 cm2
Height: 62 in
S' Lateral: 2.5 cm
Weight: 3872 oz

## 2022-12-24 LAB — BASIC METABOLIC PANEL
Anion gap: 9 (ref 5–15)
BUN: 17 mg/dL (ref 8–23)
CO2: 25 mmol/L (ref 22–32)
Calcium: 9.1 mg/dL (ref 8.9–10.3)
Chloride: 103 mmol/L (ref 98–111)
Creatinine, Ser: 0.59 mg/dL (ref 0.44–1.00)
GFR, Estimated: >60 mL/min (ref >60)
Glucose, Bld: 113 mg/dL — ABNORMAL HIGH (ref 70–99)
Potassium: 3.6 mmol/L (ref 3.5–5.1)
Sodium: 137 mmol/L (ref 135–145)

## 2022-12-24 MED ORDER — HYDROCHLOROTHIAZIDE 25 MG PO TABS
25.0000 mg | ORAL_TABLET | Freq: Every day | ORAL | Status: DC
  Administered 2022-12-24: 25 mg via ORAL
  Filled 2022-12-24: qty 1

## 2022-12-24 MED ORDER — RIVAROXABAN 10 MG PO TABS
10.0000 mg | ORAL_TABLET | Freq: Every day | ORAL | Status: DC
  Administered 2022-12-24: 10 mg via ORAL
  Filled 2022-12-24: qty 1

## 2022-12-24 NOTE — Evaluation (Signed)
Physical Therapy Evaluation Patient Details Name: Shelley Mitchell MRN: [id number] DOB: May 05, 1941 Today's Date: 12/24/2022  History of Present Illness  Pt is an 82 y/o female admitted for symptomatic bradycardia and uncontrolled HTN. PMH: HTN, HLD, OA, hx of back sx (May 2023)  Clinical Impression  Pt admitted with above diagnosis. Pt at [facility] since May 2023 but reports was still working with therapy.  States she uses a STEDY like lift to stand and pivot to w/c.  Reports she can mobilize in w/c independently.  Today, pt requiring min-mod A of 2 for bed mobility and to stand at EOB. Pt still in ED on stretcher and no recliner so returned to bed after session.  She would like to improve her transfer ability so that she can stand pivot to a w/c.  Pt currently with functional limitations due to the deficits listed below (see PT Problem List). Pt will benefit from skilled PT to increase their independence and safety with mobility to allow discharge to the venue listed below.          Recommendations for follow up therapy are one component of a multi-disciplinary discharge planning process, led by the attending physician.  Recommendations may be updated based on patient status, additional functional criteria and insurance authorization.  Follow Up Recommendations Skilled nursing-short term rehab (<3 hours/day) Can patient physically be transported by private vehicle: No    Assistance Recommended at Discharge Frequent or constant Supervision/Assistance  Patient can return home with the following  Two people to help with walking and/or transfers;Two people to help with bathing/dressing/bathroom    Equipment Recommendations None recommended by PT  Recommendations for Other Services       Functional Status Assessment Patient has had a recent decline in their functional status and demonstrates the ability to make significant improvements in function in a reasonable and predictable amount of  time.     Precautions / Restrictions Precautions Precautions: Fall Restrictions Weight Bearing Restrictions: No      Mobility  Bed Mobility Overal bed mobility: Needs Assistance Bed Mobility: Supine to Sit, Sit to Supine     Supine to sit: Mod assist, +2 for physical assistance, +2 for safety/equipment Sit to supine: +2 for physical assistance, +2 for safety/equipment, Max assist   General bed mobility comments: Assist for LLE to edge of stretcher, lift trunk and scoot to edge. Increased assist to get BLE up into bed and trunk due to high stretcher height    Transfers Overall transfer level: Needs assistance Equipment used: Rolling walker (2 wheels) Transfers: Sit to/from Stand Sit to Stand: Min assist, Mod assist, +2 physical assistance, +2 safety/equipment           General transfer comment: Initially Min A x 2 for sit to stand with RW, required Mod A x 2 to stand for second attempt to change linens with pt increasing fatigue    Ambulation/Gait                  Stairs            Wheelchair Mobility    Modified Rankin (Stroke Patients Only)       Balance Overall balance assessment: Needs assistance Sitting-balance support: No upper extremity supported, Feet supported Sitting balance-Leahy Scale: Fair     Standing balance support: Bilateral upper extremity supported, During functional activity, Reliant on assistive device for balance Standing balance-Leahy Scale: Poor Standing balance comment: Requiring RW and min A to maintain standing; fatigued easily  Pertinent Vitals/Pain Pain Assessment Pain Assessment: No/denies pain    Home Living Family/patient expects to be discharged to:: Skilled nursing facility                   Additional Comments: Pt is from [facility] since May 2023; reports is working with therapy    Prior Function Prior Level of Function : Needs assist              Mobility Comments: Working with therapy in standing frame; non-ambulatory; reports pushing w/c herself using arms and legs; uses STEDY type lift to get to chair ADLs Comments: Reports bad shoulders and working on reaching with therapy; Reports aides assist with bathing (in shower using chair) and dressing     Hand Dominance   Dominant Hand: Right    Extremity/Trunk Assessment   Upper Extremity Assessment Upper Extremity Assessment: Defer to OT evaluation RUE Deficits / Details: AAROM shoulder abduc/flex to approx 90* LUE Deficits / Details: AAROM shoulder abduc/flex to approx 90*    Lower Extremity Assessment Lower Extremity Assessment: Generalized weakness    Cervical / Trunk Assessment Cervical / Trunk Assessment: Other exceptions Cervical / Trunk Exceptions: increased body habitus  Communication   Communication: No difficulties  Cognition Arousal/Alertness: Awake/alert Behavior During Therapy: WFL for tasks assessed/performed Overall Cognitive Status: Within Functional Limits for tasks assessed                                 General Comments: WFL for basic tasks, midly anxious with mobility        General Comments General comments (skin integrity, edema, etc.): Son present and supportive    Exercises     Assessment/Plan    PT Assessment Patient needs continued PT services  PT Problem List Decreased strength;Decreased range of motion;Decreased knowledge of use of DME;Decreased activity tolerance;Decreased safety awareness;Decreased balance;Decreased mobility;Cardiopulmonary status limiting activity       PT Treatment Interventions DME instruction;Gait training;Balance training;Functional mobility training;Patient/family education;Therapeutic activities;Therapeutic exercise    PT Goals (Current goals can be found in the Care Plan section)  Acute Rehab PT Goals Patient Stated Goal: return to SNF; be able to stand and transfer PT Goal Formulation:  With patient/family Time For Goal Achievement: 01/07/23 Potential to Achieve Goals: Fair    Frequency Min 2X/week     Co-evaluation   Reason for Co-Treatment: For patient/therapist safety;To address functional/ADL transfers   OT goals addressed during session: ADL's and self-care;Strengthening/ROM       AM-PAC PT "6 Clicks" Mobility  Outcome Measure Help needed turning from your back to your side while in a flat bed without using bedrails?: A Lot Help needed moving from lying on your back to sitting on the side of a flat bed without using bedrails?: Total Help needed moving to and from a bed to a chair (including a wheelchair)?: Total Help needed standing up from a chair using your arms (e.g., wheelchair or bedside chair)?: Total Help needed to walk in hospital room?: Total Help needed climbing 3-5 steps with a railing? : Total 6 Click Score: 7    End of Session Equipment Utilized During Treatment: Gait belt Activity Tolerance: Patient tolerated treatment well Patient left: in bed;with call bell/phone within reach (IN ED) Nurse Communication: Mobility status PT Visit Diagnosis: Other abnormalities of gait and mobility (R26.89);Muscle weakness (generalized) (M62.81)    Time: 3016-0109 PT Time Calculation (min) (ACUTE ONLY): 23 min  Charges:   PT Evaluation $PT Eval Low Complexity: 1 Low          Kagan Mutchler, PT Acute Rehab [facility] [place] 12/24/2022, 1:44 PM

## 2022-12-24 NOTE — Discharge Planning (Signed)
Pt to return to [facility] from ED.

## 2022-12-24 NOTE — Care Management Obs Status (Signed)
[place] NOTIFICATION   Patient Details  Name: ABIGIAL NEWVILLE MRN: [id number] Date of Birth: 1941-09-14   Medicare Observation Status Notification Given:  Yes    Fuller Mandril, RN 12/24/2022, 1:58 PM

## 2022-12-24 NOTE — Progress Notes (Signed)
  Echocardiogram 2D Echocardiogram has been performed.  Shelley Mitchell 12/24/2022, 11:29 AM

## 2022-12-24 NOTE — ED Notes (Signed)
ED TO INPATIENT HANDOFF REPORT  ED Nurse Name and Phone #:  Nicki Reaper [phone]  S Name/Age/Gender Shelley Mitchell 82 y.o. female Room/Bed: 041C/041C  Code Status   Code Status: Full Code  Home/SNF/Other Skilled nursing facility Patient oriented to: self, place, time, and situation Is this baseline? Yes   Triage Complete: Triage complete  Chief Complaint Symptomatic bradycardia [R00.1]  Triage Note Pt reports being dizzy. EMS reports HR in 38 and BP 190/88   Allergies Allergies  Allergen Reactions   Ace Inhibitors Itching   Nsaids Itching    Level of Care/Admitting Diagnosis ED Disposition     ED Disposition  Admit   Condition  --   Comment  Hospital Area: [facility] [[id number]]  Level of Care: Telemetry Medical [104]  May place patient in observation at [facility] or [facility] if equivalent level of care is available:: No  Covid Evaluation: Asymptomatic - no recent exposure (last 10 days) testing not required  Diagnosis: Symptomatic bradycardia [518841]  Admitting Physician: Aldine Contes [[id number]]  Attending Physician: Aldine Contes [phone]          B Medical/Surgery History Past Medical History:  Diagnosis Date   Endometrial polyp    Dr. Elonda Husky   History of endometrial hyperplasia    HTN (hypertension)    Hx of adenomatous colonic polyps 1999   Hyperlipidemia    Mental disorder    GAD   Obesity    Osteoarthritis    Tachycardia    subsequently with bradycardia (?secondary to meds)   Past Surgical History:  Procedure Laterality Date   BACK SURGERY     bilateral knee replacements  2002/2003   CHOLECYSTECTOMY     COLONOSCOPY  03/2002   Dr. Jamesetta Geralds internal hemorrhoids   COLONOSCOPY  06/21/2011   polyp at IC valve (tubular adenoma)   COLONOSCOPY N/A 07/15/2016   one 8 mm polyp at hepatic flexure (tubular adenoma), on 12 mm polyp at hepatic flexure (tubular adenoma), 3 year surveillance if health permits    COLONOSCOPY N/A 10/10/2019   descending colon diverticulosis, two 4-5 mm polyps at splenic flexure (tubular adenomas). No surveillance due to age.    LUMBAR LAMINECTOMY/DECOMPRESSION MICRODISCECTOMY N/A 04/27/2022   Procedure: Thoracic Eleven-Twelve Decompression;  Surgeon: Ashok Pall, MD;  Location: [facility];  Service: Neurosurgery;  Laterality: N/A;   POLYPECTOMY  07/15/2016   Procedure: POLYPECTOMY;  Surgeon: Daneil Dolin, MD;  [facility];  Service: Endoscopy;;  Splenic Flexure polyps x 2 removed via hot snare   POLYPECTOMY  10/10/2019   Procedure: POLYPECTOMY;  Surgeon: Daneil Dolin, MD;  [facility];  Service: Endoscopy;;   TUBAL LIGATION       A IV Location/Drains/Wounds Patient Lines/Drains/Airways Status     Active Line/Drains/Airways     Name Placement date Placement time Site Days   Peripheral IV 12/23/22 22 G Anterior;Left Antecubital 12/23/22  --  Antecubital  1   External Urinary Catheter 12/24/22  1012  --  less than 1   Incision (Closed) 04/27/22 Back 04/27/22  2008  -- 241            Intake/Output Last 24 hours No intake or output data in the 24 hours ending 12/24/22 1254  Labs/Imaging Results for orders placed or performed during the hospital encounter of 12/23/22 (from the past 48 hour(s))  CBC with Differential     Status: Abnormal   Collection Time: 12/23/22  2:14 PM  Result Value Ref  Range   WBC 5.8 4.0 - 10.5 K/uL   RBC 4.25 3.87 - 5.11 MIL/uL   Hemoglobin 11.8 (L) 12.0 - 15.0 g/dL   HCT 36.1 36.0 - 46.0 %   MCV 84.9 80.0 - 100.0 fL   MCH 27.8 26.0 - 34.0 pg   MCHC 32.7 30.0 - 36.0 g/dL   RDW 16.3 (H) 11.5 - 15.5 %   Platelets 203 150 - 400 K/uL   nRBC 0.0 0.0 - 0.2 %   Neutrophils Relative % 55 %   Neutro Abs 3.2 1.7 - 7.7 K/uL   Lymphocytes Relative 29 %   Lymphs Abs 1.7 0.7 - 4.0 K/uL   Monocytes Relative 12 %   Monocytes Absolute 0.7 0.1 - 1.0 K/uL   Eosinophils Relative 3 %   Eosinophils Absolute 0.2 0.0 - 0.5  K/uL   Basophils Relative 1 %   Basophils Absolute 0.0 0.0 - 0.1 K/uL   Immature Granulocytes 0 %   Abs Immature Granulocytes 0.01 0.00 - 0.07 K/uL    Comment: Performed at [facility] [address] [place], [place] [address]  Basic metabolic panel     Status: Abnormal   Collection Time: 12/23/22  2:14 PM  Result Value Ref Range   Sodium 139 135 - 145 mmol/L   Potassium 3.8 3.5 - 5.1 mmol/L   Chloride 103 98 - 111 mmol/L   CO2 29 22 - 32 mmol/L   Glucose, Bld 100 (H) 70 - 99 mg/dL    Comment: Glucose reference range applies only to samples taken after fasting for at least 8 hours.   BUN 20 8 - 23 mg/dL   Creatinine, Ser 0.65 0.44 - 1.00 mg/dL   Calcium 9.1 8.9 - 10.3 mg/dL   GFR, Estimated >60 >60 mL/min    Comment: (NOTE) Calculated using the CKD-EPI Creatinine Equation (2021)    Anion gap 7 5 - 15    Comment: Performed at [facility] [address] [place], [place] [address]  TSH     Status: None   Collection Time: 12/23/22  2:14 PM  Result Value Ref Range   TSH 2.994 0.350 - 4.500 uIU/mL    Comment: Performed by a 3rd Generation assay with a functional sensitivity of <=0.01 uIU/mL. Performed at [facility], [place] [address] [place], [place] [address]   Magnesium     Status: None   Collection Time: 12/23/22  2:14 PM  Result Value Ref Range   Magnesium 2.3 1.7 - 2.4 mg/dL    Comment: Performed at [facility] [address], [place], [place] [address]  Troponin I (High Sensitivity)     Status: None   Collection Time: 12/23/22  2:14 PM  Result Value Ref Range   Troponin I (High Sensitivity) 10 <18 ng/L    Comment: (NOTE) Elevated high sensitivity troponin I (hsTnI) values and significant  changes across serial measurements may suggest ACS but many other  chronic and acute conditions are known to elevate hsTnI results.  Refer to the "Links" section for chest pain algorithms and additional  guidance. Performed at [facility],  [place] [address] [place],  [address]   Urinalysis, Routine w reflex microscopic Urine, Clean Catch     Status: None   Collection Time: 12/23/22  4:30 PM  Result Value Ref Range   Color, Urine YELLOW YELLOW   APPearance CLEAR CLEAR   Specific Gravity, Urine 1.011 1.005 - 1.030  pH 6.0 5.0 - 8.0   Glucose, UA NEGATIVE NEGATIVE mg/dL   Hgb urine dipstick NEGATIVE NEGATIVE   Bilirubin Urine NEGATIVE NEGATIVE   Ketones, ur NEGATIVE NEGATIVE mg/dL   Protein, ur NEGATIVE NEGATIVE mg/dL   Nitrite NEGATIVE NEGATIVE   Leukocytes,Ua NEGATIVE NEGATIVE    Comment: Performed at [facility] [address] [place], [place] [address]  CBC     Status: Abnormal   Collection Time: 12/23/22 11:40 PM  Result Value Ref Range   WBC 7.8 4.0 - 10.5 K/uL   RBC 4.37 3.87 - 5.11 MIL/uL   Hemoglobin 12.2 12.0 - 15.0 g/dL   HCT 38.0 36.0 - 46.0 %   MCV 87.0 80.0 - 100.0 fL   MCH 27.9 26.0 - 34.0 pg   MCHC 32.1 30.0 - 36.0 g/dL   RDW 16.2 (H) 11.5 - 15.5 %   Platelets 180 150 - 400 K/uL   nRBC 0.0 0.0 - 0.2 %    Comment: Performed at [facility], [place] [address], [place], [place] [address]  Basic metabolic panel     Status: Abnormal   Collection Time: 12/23/22 11:40 PM  Result Value Ref Range   Sodium 137 135 - 145 mmol/L   Potassium 3.6 3.5 - 5.1 mmol/L   Chloride 103 98 - 111 mmol/L   CO2 25 22 - 32 mmol/L   Glucose, Bld 113 (H) 70 - 99 mg/dL    Comment: Glucose reference range applies only to samples taken after fasting for at least 8 hours.   BUN 17 8 - 23 mg/dL   Creatinine, Ser 0.59 0.44 - 1.00 mg/dL   Calcium 9.1 8.9 - 10.3 mg/dL   GFR, Estimated >60 >60 mL/min    Comment: (NOTE) Calculated using the CKD-EPI Creatinine Equation (2021)    Anion gap 9 5 - 15    Comment: Performed at [facility] [address] [place], [place] [address]   ECHOCARDIOGRAM COMPLETE  Result Date: 12/24/2022    ECHOCARDIOGRAM REPORT   Patient Name:   Shelley Mitchell Date of Exam: 12/24/2022  Medical Rec #:  [id number]    Height:       62.0 in Accession #:    [id number]   Weight:       242.0 lb Date of Birth:  [birth year]    BSA:          2.073 m Patient Age:    63 years     BP:           136/86 mmHg Patient Gender: F            HR:           41 bpm. Exam Location:  Inpatient Procedure: 2D Echo Indications:    symptomatic bradycardia  History:        Patient has prior history of Echocardiogram examinations, most                 recent 03/08/2022. Risk Factors:Sleep Apnea, Hypertension and                 Dyslipidemia.  Sonographer:    Johny Chess RDCS Referring Phys: [id number] [facility] NARENDRA  Sonographer Comments: Patient is obese. Image acquisition challenging due to patient body habitus. IMPRESSIONS  1. Left ventricular ejection fraction, by estimation, is 60 to 65%. The left ventricle has normal function. The left ventricle has no regional wall motion abnormalities. There is mild left ventricular hypertrophy.  Left ventricular diastolic parameters are consistent with Grade I diastolic dysfunction (impaired relaxation).  2. Right ventricular systolic function is normal. The right ventricular size is normal. There is normal pulmonary artery systolic pressure. The estimated right ventricular systolic pressure is 47.8 mmHg.  3. The mitral valve is normal in structure. Trivial mitral valve regurgitation. No evidence of mitral stenosis.  4. The aortic valve is tricuspid. Aortic valve regurgitation is not visualized. Aortic valve sclerosis/calcification is present, without any evidence of aortic stenosis.  5. The inferior vena cava is normal in size with greater than 50% respiratory variability, suggesting right atrial pressure of 3 mmHg. FINDINGS  Left Ventricle: Left ventricular ejection fraction, by estimation, is 60 to 65%. The left ventricle has normal function. The left ventricle has no regional wall motion abnormalities. The left ventricular internal cavity size was normal in size. There is  mild  left ventricular hypertrophy. Left ventricular diastolic parameters are consistent with Grade I diastolic dysfunction (impaired relaxation). Right Ventricle: The right ventricular size is normal. No increase in right ventricular wall thickness. Right ventricular systolic function is normal. There is normal pulmonary artery systolic pressure. The tricuspid regurgitant velocity is 2.52 m/s, and  with an assumed right atrial pressure of 3 mmHg, the estimated right ventricular systolic pressure is 29.5 mmHg. Left Atrium: Left atrial size was normal in size. Right Atrium: Right atrial size was normal in size. Pericardium: There is no evidence of pericardial effusion. Mitral Valve: The mitral valve is normal in structure. Trivial mitral valve regurgitation. No evidence of mitral valve stenosis. Tricuspid Valve: The tricuspid valve is normal in structure. Tricuspid valve regurgitation is trivial. Aortic Valve: The aortic valve is tricuspid. Aortic valve regurgitation is not visualized. Aortic valve sclerosis/calcification is present, without any evidence of aortic stenosis. Pulmonic Valve: The pulmonic valve was not well visualized. Pulmonic valve regurgitation is not visualized. Aorta: The aortic root is normal in size and structure. Venous: The inferior vena cava is normal in size with greater than 50% respiratory variability, suggesting right atrial pressure of 3 mmHg. IAS/Shunts: The interatrial septum was not well visualized.  LEFT VENTRICLE PLAX 2D LVIDd:         4.10 cm   Diastology LVIDs:         2.50 cm   LV e' medial:    6.42 cm/s LV PW:         1.30 cm   LV E/e' medial:  10.2 LV IVS:        1.00 cm   LV e' lateral:   8.49 cm/s LVOT diam:     1.90 cm   LV E/e' lateral: 7.7 LV SV:         72 LV SV Index:   35 LVOT Area:     2.84 cm  RIGHT VENTRICLE             IVC RV S prime:     17.60 cm/s  IVC diam: 1.40 cm TAPSE (M-mode): 2.5 cm LEFT ATRIUM           Index LA diam:      3.10 cm 1.50 cm/m LA Vol (A4C): 61.2 ml  29.52 ml/m  AORTIC VALVE LVOT Vmax:   104.00 cm/s LVOT Vmean:  65.500 cm/s LVOT VTI:    0.253 m  AORTA Ao Root diam: 2.80 cm MITRAL VALVE               TRICUSPID VALVE MV Area (PHT): 2.52 cm    TR  Peak grad:   25.4 mmHg MV Decel Time: 301 msec    TR Vmax:        252.00 cm/s MV E velocity: 65.20 cm/s MV A velocity: 87.20 cm/s  SHUNTS MV E/A ratio:  0.75        Systemic VTI:  0.25 m                            Systemic Diam: 1.90 cm Oswaldo Milian MD Electronically signed by Oswaldo Milian MD Signature Date/Time: 12/24/2022/11:44:43 AM    Final    CT Head Wo Contrast  Result Date: 12/23/2022 CLINICAL DATA:  Vertigo. Hypertension. Fatigue and dizziness. Dizziness. EXAM: CT HEAD WITHOUT CONTRAST TECHNIQUE: Contiguous axial images were obtained from the base of the skull through the vertex without intravenous contrast. RADIATION DOSE REDUCTION: This exam was performed according to the departmental dose-optimization program which includes automated exposure control, adjustment of the mA and/or kV according to patient size and/or use of iterative reconstruction technique. COMPARISON:  CT brain 07/04/2021 and 04/23/2022, CT facial bones 07/04/2021 FINDINGS: Brain: There is moderate cortical atrophy, not significantly changed from prior and within normal limits for patient age. The ventricles are normal in configuration. The basilar cisterns are patent. No mass, mass effect, or midline shift. No acute intracranial hemorrhage is seen. No abnormal extra-axial fluid collection. 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 Moderate patchy periventricular white matter hypodensities, similar to prior and likely chronic ischemic white matter changes. Otherwise, preservation of the normal cortical gray-white interface without CT evidence of an acute major vascular territorial cortical based infarction. Vascular: No hyperdense vessel or unexpected calcification. Atherosclerotic calcifications are seen within  the skull base arteries. Skull: Normal. Negative for fracture or focal lesion. Sinuses/Orbits: The visualized orbits are unremarkable. There is again remote right orbital floor fracture with the right inferior rectus muscle mildly inferiorly displaced, and approximately 9 mm craniocaudal dimension periorbital fat herniating through the open right orbital floor fracture defect, unchanged from prior. Other: None. IMPRESSION: 1. No acute intracranial findings. 2. Moderate cortical atrophy and chronic ischemic white matter changes, similar to prior. 3. Redemonstration of remote right orbital floor fracture with downward herniation of intraorbital fat. Electronically Signed   By: Yvonne Kendall M.D.   On: 12/23/2022 15:18   DG Chest Port 1 View  Result Date: 12/23/2022 CLINICAL DATA:  Hypertension, dizziness EXAM: PORTABLE CHEST 1 VIEW COMPARISON:  None Available. FINDINGS: Cardiomegaly. Both lungs are clear. The visualized skeletal structures are unremarkable. IMPRESSION: Cardiomegaly without acute abnormality of the lungs in [facility] portable projection. Electronically Signed   By: Delanna Ahmadi M.D.   On: 12/23/2022 14:41    Pending Labs Unresulted Labs (From admission, onward)    None       Vitals/Pain Today's Vitals   12/24/22 0315 12/24/22 0318 12/24/22 0515 12/24/22 0900  BP: 130/89  136/86 (!) 154/71  Pulse: (!) 47  (!) 43 (!) 51  Resp: 18  19 (!) 23  Temp:  97.7 F (36.5 C)  97.7 F (36.5 C)  TempSrc:  Oral    SpO2: 98%  98% 100%  Weight:      Height:      PainSc:        Isolation Precautions No active isolations  Medications Medications  polyethylene glycol (MIRALAX / GLYCOLAX) packet 17 g (17 g Oral Given 12/24/22 1018)  senna-docusate (Senokot-S) tablet 1 tablet (1 tablet Oral Given 12/23/22 2154)  albuterol (PROVENTIL) (2.5 MG/3ML) 0.083% nebulizer solution 2.5 mg (has no administration in time range)  latanoprost (XALATAN) 0.005 % ophthalmic solution 1 drop (1 drop Both Eyes  Given 12/23/22 2232)  pravastatin (PRAVACHOL) tablet 40 mg (40 mg Oral Given 12/24/22 1017)  sertraline (ZOLOFT) tablet 100 mg (100 mg Oral Given 12/24/22 1017)  acetaminophen (TYLENOL) tablet 1,000 mg (1,000 mg Oral Given 12/24/22 1019)  hydrochlorothiazide (HYDRODIURIL) tablet 25 mg (25 mg Oral Given 12/24/22 1017)  rivaroxaban (XARELTO) tablet 10 mg (has no administration in time range)    Mobility non-ambulatory     Focused Assessments Cardiac Assessment Handoff:  Cardiac Rhythm: Sinus bradycardia Lab Results  Component Value Date   CKTOTAL 98 04/23/2022   No results found for: "DDIMER" Does the Patient currently have chest pain? No    R Recommendations: See Admitting Provider Note  Report given to:   Additional Notes:  Pleasant Aox4 woman, asymptomatic bradycardia with HX of HTN, able to stand but no pivot max assist, uses purewick, bedpan, has some pain in hip from recent surgery, family updated at bedside.

## 2022-12-24 NOTE — ED Notes (Signed)
Ptar called 

## 2022-12-24 NOTE — Evaluation (Signed)
Occupational Therapy Evaluation Patient Details Name: Shelley Mitchell MRN: [id number] DOB: [birth year] Today's Date: 12/24/2022   History of Present Illness Pt is an 82 y/o female admitted for symptomatic bradycardia and uncontrolled HTN. PMH: HTN, HLD, OA, hx of back sx (May 2023)   Clinical Impression   PTA, pt from SNF rehab, reports actively working with therapy and using standing frame for transfers. Pt reports assist with all bathing, dressing and toileting. Pt presents now with deficits in standing balance, endurance and B shoulder ROM. Overall, pt requires Mod A x 2 for bed mobility and Min-Mod A x 2 for standing with RW at bedside. Pt requires Mod A for UB ADL and up to Total A for LB ADLs due to deficits. Will continue to follow acutely to maximize functional abilities. Rec continued therapy services at SNF.  HR at rest 40s, up to 80s with activity       Recommendations for follow up therapy are one component of a multi-disciplinary discharge planning process, led by the attending physician.  Recommendations may be updated based on patient status, additional functional criteria and insurance authorization.   Follow Up Recommendations  Skilled nursing-short term rehab (<3 hours/day)     Assistance Recommended at Discharge Frequent or constant Supervision/Assistance  Patient can return home with the following Two people to help with walking and/or transfers;A lot of help with bathing/dressing/bathroom    Functional Status Assessment  Patient has had a recent decline in their functional status and demonstrates the ability to make significant improvements in function in a reasonable and predictable amount of time.  Equipment Recommendations  None recommended by OT    Recommendations for Other Services       Precautions / Restrictions Precautions Precautions: Fall Restrictions Weight Bearing Restrictions: No      Mobility Bed Mobility Overal bed mobility: Needs  Assistance Bed Mobility: Supine to Sit, Sit to Supine     Supine to sit: Mod assist, +2 for physical assistance, +2 for safety/equipment Sit to supine: +2 for physical assistance, +2 for safety/equipment, Max assist   General bed mobility comments: Assist for LLE to edge of stretcher, lift trunk and scoot to edge. Increased assist to get BLE up into bed and trunk due to high stretcher height    Transfers Overall transfer level: Needs assistance Equipment used: Rolling walker (2 wheels) Transfers: Sit to/from Stand Sit to Stand: Min assist, Mod assist, +2 physical assistance, +2 safety/equipment           General transfer comment: Initially Min A x 2 for sit to stand with RW, required Mod A x 2 to stand for second attempt to change linens with pt increasing fatigue      Balance Overall balance assessment: Needs assistance Sitting-balance support: No upper extremity supported, Feet supported Sitting balance-Leahy Scale: Fair     Standing balance support: Bilateral upper extremity supported, During functional activity Standing balance-Leahy Scale: Poor                             ADL either performed or assessed with clinical judgement   ADL Overall ADL's : Needs assistance/impaired Eating/Feeding: Set up   Grooming: Set up;Sitting   Upper Body Bathing: Minimal assistance;Sitting   Lower Body Bathing: Maximal assistance;Sit to/from stand   Upper Body Dressing : Moderate assistance;Sitting   Lower Body Dressing: Total assistance;Sit to/from stand       Toileting- Water quality scientist and Hygiene: Total assistance;Bed  level         General ADL Comments: Pt limited by BLE weakness and balance deficits since prior sx May 2023 resulting in continued rehab stay     Vision Baseline Vision/History: 1 Wears glasses Ability to See in Adequate Light: 0 Adequate Patient Visual Report: No change from baseline Vision Assessment?: No apparent visual deficits      Perception     Praxis      Pertinent Vitals/Pain Pain Assessment Pain Assessment: No/denies pain     Hand Dominance Right   Extremity/Trunk Assessment Upper Extremity Assessment Upper Extremity Assessment: RUE deficits/detail;LUE deficits/detail RUE Deficits / Details: AAROM shoulder abduc/flex to approx 90* LUE Deficits / Details: AAROM shoulder abduc/flex to approx 90*   Lower Extremity Assessment Lower Extremity Assessment: Defer to PT evaluation   Cervical / Trunk Assessment Cervical / Trunk Assessment: Other exceptions Cervical / Trunk Exceptions: increased body habitus   Communication Communication Communication: No difficulties   Cognition Arousal/Alertness: Awake/alert Behavior During Therapy: WFL for tasks assessed/performed Overall Cognitive Status: Within Functional Limits for tasks assessed                                 General Comments: WFL for basic tasks, midly anxious with mobility     General Comments  Son present and supportive    Exercises     Shoulder Instructions      Home Living Family/patient expects to be discharged to:: Skilled nursing facility                                 Additional Comments: Pt is from [facility] since May 2023; reports is working with therapy      Prior Functioning/Environment Prior Level of Function : Needs assist             Mobility Comments: Working with therapy in standing frame; non-ambulatory; reports pushing w/c herself using arms and legs; uses STEDY type lift to get to chair ADLs Comments: Reports bad shoulders and working on reaching with therapy; Reports aides assist with bathing (in shower using chair) and dressing        OT Problem List: Decreased strength;Decreased activity tolerance;Impaired balance (sitting and/or standing);Decreased knowledge of use of DME or AE;Obesity;Impaired UE functional use      OT Treatment/Interventions: Self-care/ADL  training;Therapeutic exercise;Energy conservation;DME and/or AE instruction;Therapeutic activities;Patient/family education    OT Goals(Current goals can be found in the care plan section) Acute Rehab OT Goals Patient Stated Goal: be able to go home eventually OT Goal Formulation: With patient/family Time For Goal Achievement: 01/07/23 Potential to Achieve Goals: Good  OT Frequency: Min 2X/week    Co-evaluation PT/OT/SLP Co-Evaluation/Treatment: Yes Reason for Co-Treatment: For patient/therapist safety;To address functional/ADL transfers   OT goals addressed during session: ADL's and self-care;Strengthening/ROM      AM-PAC OT "6 Clicks" Daily Activity     Outcome Measure Help from another person eating meals?: A Little Help from another person taking care of personal grooming?: A Little Help from another person toileting, which includes using toliet, bedpan, or urinal?: Total Help from another person bathing (including washing, rinsing, drying)?: A Lot Help from another person to put on and taking off regular upper body clothing?: A Lot Help from another person to put on and taking off regular lower body clothing?: Total 6 Click Score: 12   End of  Session Equipment Utilized During Treatment: Gait belt;Rolling walker (2 wheels) Nurse Communication: Mobility status  Activity Tolerance: Patient tolerated treatment well Patient left: in bed;with call bell/phone within reach;with family/visitor present  OT Visit Diagnosis: Unsteadiness on feet (R26.81);Other abnormalities of gait and mobility (R26.89);Muscle weakness (generalized) (M62.81)                Time: 5300-5110 OT Time Calculation (min): 22 min Charges:  OT General Charges $OT Visit: 1 Visit OT Evaluation $OT Eval Low Complexity: 1 Low  Malachy Chamber, OTR/L Acute Rehab Services Office: [phone]   Shelley Mitchell 12/24/2022, 1:33 PM

## 2022-12-24 NOTE — Progress Notes (Addendum)
This CSW attempted to contact Sharyn Lull at [facility] to inquire about pt returning. Left HIPAA Compliant voicemail.  Addend @ 3:08 PM Per RNCM, Sharyn Lull is out sick at [facility]. Reach out to [facility] - [phone].

## 2022-12-24 NOTE — ED Notes (Signed)
Report given to receiving facility.

## 2022-12-24 NOTE — Discharge Instructions (Addendum)
Ms. ROOPA GRAVER  You were admitted for dizziness and a low heart rate.  Your condition improved rapidly.  The results of your evaluation were reassuring.  We are discharging you from the hospital now that you are doing better. To help assist you on your road to recovery, I have written the following recommendations:   Follow-up with your cardiologist as soon as possible after leaving the hospital so that they can evaluate you for your low heart rate and shortness of breath.  Do not take clonidine as this can lower your heart rate. Restart your hydrochlorothiazide for your blood pressure.  For now, do not take your nifedipine (Procardia XL).  Wait until you follow-up with your cardiologist or primary care doctor before restarting this medicine.  It was a privilege to be a part of your hospital care team, and I hope you feel better as a result of your stay.  All the best, Nani Gasser, MD

## 2022-12-24 NOTE — ED Notes (Signed)
Patient transported to Ultrasound 

## 2022-12-24 NOTE — Progress Notes (Signed)
Pt placed on cpap for the night and seems to be tolerating it well at this time.  

## 2022-12-24 NOTE — Discharge Summary (Signed)
Name: Shelley Mitchell MRN: [id number] DOB: Apr 10, 1941 82 y.o. PCP: [name], MD  Date of Admission: 12/23/2022  2:05 PM Date of Discharge: 12/24/2022 2:54 PM Attending Physician: Aldine Contes, MD  Discharge Diagnosis: Principal Problem:   Symptomatic bradycardia   Discharge Medications: Allergies as of 12/24/2022       Reactions   Ace Inhibitors Itching   Nsaids Itching        Medication List     STOP taking these medications    NIFEdipine 90 MG 24 hr tablet Commonly known as: PROCARDIA XL/NIFEDICAL-XL       TAKE these medications    acetaminophen 500 MG tablet Commonly known as: TYLENOL Take 100 mg by mouth 2 (two) times daily.   albuterol 108 (90 Base) MCG/ACT inhaler Commonly known as: VENTOLIN HFA Inhale 2 puffs into the lungs every 8 (eight) hours as needed for wheezing or shortness of breath.   aspirin EC 81 MG tablet Take 81 mg by mouth daily.   furosemide 20 MG tablet Commonly known as: LASIX Take 20 mg by mouth daily.   guaiFENesin 100 MG/5ML liquid Commonly known as: ROBITUSSIN Take 10 mLs by mouth every 4 (four) hours as needed for cough.   hydrochlorothiazide 25 MG tablet Commonly known as: HYDRODIURIL Take 1 tablet (25 mg total) by mouth daily.   latanoprost 0.005 % ophthalmic solution Commonly known as: XALATAN Place 1 drop into both eyes at bedtime.   lubiprostone 8 MCG capsule Commonly known as: Amitiza Take 1 capsule (8 mcg total) by mouth daily with breakfast.   medroxyPROGESTERone 10 MG tablet Commonly known as: PROVERA TAKE 1 TABLET BY MOUTH DAILY 10 DAYS PER MONTH What changed: See the new instructions.   multivitamin with minerals tablet Take 1 tablet by mouth daily.   polyethylene glycol 17 g packet Commonly known as: MIRALAX / GLYCOLAX Take 17 g by mouth daily.   pravastatin 40 MG tablet Commonly known as: PRAVACHOL Take 40 mg by mouth daily.   Pro-Stat AWC Liqd Take 30 mLs by mouth in the morning and at  bedtime.   sertraline 100 MG tablet Commonly known as: ZOLOFT Take 100 mg by mouth daily.        Follow-up Appointments:  Follow-up Information     Kate Sable, MD. Call today.   Specialties: Cardiology, Radiology Why: Call for an appointment as soon as possible after discharge. Contact information: [place] [address] [phone]         [name], MD. Call.   Why: Call for an appointment as soon as possible after discharge. Contact information: [address] [place] [address] [phone]                 Disposition and follow-up:   Shelley Mitchell is a 82 y.o. year old with history of hypertension and hyperlipidemia admitted for dizziness in setting of bradycardia.  Symptomatic bradycardia Symptoms resolved overnight, and patient's heart rate remained stable in 50s-60s. EKG showed sinus brady, occasional PAC, possible occasional sinus pause, 1st degree AV block. Recommend avoiding clonidine, as this may have triggered her episode. - Recommend cardiology follow-up  Dyspnea on exertion History notable for several weeks to months of increased dyspnea on exertion. No aortic stenosis on echo. - Investigate at follow-up  Hypertension Hypertensive on admission. Restarted HCTZ to good effect. - Continue HCTZ - Hold nifedipine until follow up with cardiology or PCP  Therapy PT/OT professionals at hospital recommend skilled nursing-short  term rehab (<3 hours/day).  Hospital Course by problem list:  Symptomatic bradycardia Presented from [facility] with dizziness, found to bradycardic to 38. Also hypertensive. Workup for ACS and CVA unremarkable. History notable for clonidine administration. Given that bradycardia is a well known side effect of clonidine, this team recommends discontinuing this drug from future use. Echocardiogram was unremarkable during admission. Patient's symptoms resolved quickly without  chronotropic therapy. Heart rate stabilized in 50s. She was discharged back to [facility] in good condition with instructions to stop taking clonidine, restart HCTZ, and follow up with cardiologist.  Hypertension Initially elevated to 297 systolic. Home HCTZ was restarted morning after admission to good effect. Recommend holding nifedipine as she is normotensive on HCTZ. Avoid clonidine.   Discharge Exam: Feeling well, at her subjective baseline. No dizziness. No chest pain or dyspnea.   Blood pressure (!) 118/35, pulse (!) 51, temperature 97.7 F (36.5 C), resp. rate 20, height '5\' 2"'$  (1.575 m), weight 109.8 kg, SpO2 100 %.  Well appearing Regular bradycardia, radial pulses are 2+, no elevated JVP Breathing is regular and unlabored, lungs clear Abdomen soft and non-tender Skin is warm and dry No gross neurologic deficits Pleasant, mood and affect are concordant  Pertinent studies and procedures:   Latest Reference Range & Units 12/23/22 23:40  Sodium 135 - 145 mmol/L 137  Potassium 3.5 - 5.1 mmol/L 3.6  Chloride 98 - 111 mmol/L 103  CO2 22 - 32 mmol/L 25  Glucose 70 - 99 mg/dL 113 (H)  BUN 8 - 23 mg/dL 17  Creatinine 0.44 - 1.00 mg/dL 0.59  Calcium 8.9 - 10.3 mg/dL 9.1  Anion gap 5 - 15  9  GFR, Estimated >60 mL/min >60  (H): Data is abnormally high   Latest Reference Range & Units 12/23/22 14:14  Troponin I (High Sensitivity) <18 ng/L 10    Latest Reference Range & Units 12/23/22 14:14  TSH 0.350 - 4.500 uIU/mL 2.994     Latest Reference Range & Units 12/23/22 23:40  WBC 4.0 - 10.5 K/uL 7.8  RBC 3.87 - 5.11 MIL/uL 4.37  Hemoglobin 12.0 - 15.0 g/dL 12.2  HCT 36.0 - 46.0 % 38.0  MCV 80.0 - 100.0 fL 87.0  MCH 26.0 - 34.0 pg 27.9  MCHC 30.0 - 36.0 g/dL 32.1  RDW 11.5 - 15.5 % 16.2 (H)  Platelets 150 - 400 K/uL 180  nRBC 0.0 - 0.2 % 0.0  (H): Data is abnormally high  TTE:  1. Left ventricular ejection fraction, by estimation, is 60 to 65%. The  left ventricle has  normal function. The left ventricle has no regional  wall motion abnormalities. There is mild left ventricular hypertrophy.  Left ventricular diastolic parameters  are consistent with Grade I diastolic dysfunction (impaired relaxation).   2. Right ventricular systolic function is normal. The right ventricular  size is normal. There is normal pulmonary artery systolic pressure. The  estimated right ventricular systolic pressure is 98.9 mmHg.   3. The mitral valve is normal in structure. Trivial mitral valve  regurgitation. No evidence of mitral stenosis.   4. The aortic valve is tricuspid. Aortic valve regurgitation is not  visualized. Aortic valve sclerosis/calcification is present, without any  evidence of aortic stenosis.   5. The inferior vena cava is normal in size with greater than 50%  respiratory variability, suggesting right atrial pressure of 3 mmHg.   EKG: sinus bradycardia with occasional PACs and possibly occasional sinus pause, 1st degree AV block  Discharge Instructions:  Discharge Instructions      Ms. [place] were admitted for dizziness and a low heart rate.  Your condition improved rapidly.  The results of your evaluation were reassuring.  We are discharging you from the hospital now that you are doing better. To help assist you on your road to recovery, I have written the following recommendations:   Follow-up with your cardiologist as soon as possible after leaving the hospital so that they can evaluate you for your low heart rate and shortness of breath.  Do not take clonidine as this can lower your heart rate. Restart your hydrochlorothiazide for your blood pressure.  For now, do not take your nifedipine (Procardia XL).  Wait until you follow-up with your cardiologist or primary care doctor before restarting this medicine.  It was a privilege to be a part of your hospital care team, and I hope you feel better as a result of your stay.  All the  best, Nani Gasser, MD     Nani Gasser MD 12/24/2022, 2:54 PM

## 2022-12-25 ENCOUNTER — Observation Stay (HOSPITAL_COMMUNITY)
Admission: EM | Admit: 2022-12-25 | Discharge: 2022-12-27 | Disposition: A | Discharge status: Skilled Nursing Facility | Payer: Medicare PPO | Attending: Internal Medicine | Admitting: Internal Medicine

## 2022-12-25 ENCOUNTER — Emergency Department (HOSPITAL_COMMUNITY): Payer: Medicare PPO

## 2022-12-25 ENCOUNTER — Encounter (HOSPITAL_COMMUNITY): Payer: Self-pay | Admitting: Emergency Medicine

## 2022-12-25 ENCOUNTER — Other Ambulatory Visit: Payer: Self-pay

## 2022-12-25 DIAGNOSIS — I440 Atrioventricular block, first degree: Secondary | ICD-10-CM | POA: Insufficient documentation

## 2022-12-25 DIAGNOSIS — K5900 Constipation, unspecified: Secondary | ICD-10-CM | POA: Insufficient documentation

## 2022-12-25 DIAGNOSIS — R42 Dizziness and giddiness: Secondary | ICD-10-CM | POA: Diagnosis not present

## 2022-12-25 DIAGNOSIS — Z7982 Long term (current) use of aspirin: Secondary | ICD-10-CM | POA: Diagnosis not present

## 2022-12-25 DIAGNOSIS — Z79899 Other long term (current) drug therapy: Secondary | ICD-10-CM | POA: Insufficient documentation

## 2022-12-25 DIAGNOSIS — I10 Essential (primary) hypertension: Principal | ICD-10-CM | POA: Insufficient documentation

## 2022-12-25 DIAGNOSIS — R001 Bradycardia, unspecified: Secondary | ICD-10-CM

## 2022-12-25 DIAGNOSIS — I672 Cerebral atherosclerosis: Secondary | ICD-10-CM | POA: Insufficient documentation

## 2022-12-25 LAB — BASIC METABOLIC PANEL
Anion gap: 7 (ref 5–15)
BUN: 18 mg/dL (ref 8–23)
CO2: 29 mmol/L (ref 22–32)
Calcium: 9.1 mg/dL (ref 8.9–10.3)
Chloride: 103 mmol/L (ref 98–111)
Creatinine, Ser: 0.56 mg/dL (ref 0.44–1.00)
GFR, Estimated: >60 mL/min (ref >60)
Glucose, Bld: 104 mg/dL — ABNORMAL HIGH (ref 70–99)
Potassium: 3.8 mmol/L (ref 3.5–5.1)
Sodium: 139 mmol/L (ref 135–145)

## 2022-12-25 LAB — CBC WITH DIFFERENTIAL/PLATELET
Abs Immature Granulocytes: 0.01 10*3/uL (ref 0.00–0.07)
Basophils Absolute: 0 10*3/uL (ref 0.0–0.1)
Basophils Relative: 1 %
Eosinophils Absolute: 0.2 10*3/uL (ref 0.0–0.5)
Eosinophils Relative: 3 %
HCT: 35 % — ABNORMAL LOW (ref 36.0–46.0)
Hemoglobin: 11.7 g/dL — ABNORMAL LOW (ref 12.0–15.0)
Immature Granulocytes: 0 %
Lymphocytes Relative: 24 %
Lymphs Abs: 1.4 10*3/uL (ref 0.7–4.0)
MCH: 28.3 pg (ref 26.0–34.0)
MCHC: 33.4 g/dL (ref 30.0–36.0)
MCV: 84.7 fL (ref 80.0–100.0)
Monocytes Absolute: 0.6 10*3/uL (ref 0.1–1.0)
Monocytes Relative: 11 %
Neutro Abs: 3.7 10*3/uL (ref 1.7–7.7)
Neutrophils Relative %: 61 %
Platelets: 200 10*3/uL (ref 150–400)
RBC: 4.13 MIL/uL (ref 3.87–5.11)
RDW: 16.1 % — ABNORMAL HIGH (ref 11.5–15.5)
WBC: 5.9 10*3/uL (ref 4.0–10.5)
nRBC: 0 % (ref 0.0–0.2)

## 2022-12-25 LAB — TROPONIN I (HIGH SENSITIVITY): Troponin I (High Sensitivity): 15 ng/L (ref <18)

## 2022-12-25 MED ORDER — POLYETHYLENE GLYCOL 3350 17 G PO PACK
17.0000 g | PACK | Freq: Every day | ORAL | Status: DC
  Administered 2022-12-26 – 2022-12-27 (×2): 17 g via ORAL
  Filled 2022-12-25 (×2): qty 1

## 2022-12-25 MED ORDER — HYDRALAZINE HCL 25 MG PO TABS: 25.0000 mg | ORAL_TABLET | Freq: Four times a day (QID) | ORAL | Status: DC | PRN

## 2022-12-25 MED ORDER — SODIUM CHLORIDE 0.9 % IV BOLUS
1000.0000 mL | Freq: Once | INTRAVENOUS | Status: AC
  Administered 2022-12-25: 1000 mL via INTRAVENOUS

## 2022-12-25 MED ORDER — HYDROCHLOROTHIAZIDE 25 MG PO TABS
25.0000 mg | ORAL_TABLET | Freq: Once | ORAL | Status: AC
  Administered 2022-12-25: 25 mg via ORAL
  Filled 2022-12-25: qty 1

## 2022-12-25 MED ORDER — IOHEXOL 350 MG/ML SOLN
75.0000 mL | Freq: Once | INTRAVENOUS | Status: AC | PRN
  Administered 2022-12-25: 75 mL via INTRAVENOUS

## 2022-12-25 MED ORDER — ACETAMINOPHEN 650 MG RE SUPP: 650.0000 mg | Freq: Four times a day (QID) | RECTAL | Status: DC | PRN

## 2022-12-25 MED ORDER — HYDRALAZINE HCL 20 MG/ML IJ SOLN
5.0000 mg | Freq: Once | INTRAMUSCULAR | Status: AC
  Administered 2022-12-25: 5 mg via INTRAVENOUS
  Filled 2022-12-25: qty 1

## 2022-12-25 MED ORDER — ENOXAPARIN SODIUM 40 MG/0.4ML IJ SOSY
40.0000 mg | PREFILLED_SYRINGE | INTRAMUSCULAR | Status: DC
  Administered 2022-12-26 – 2022-12-27 (×2): 40 mg via SUBCUTANEOUS
  Filled 2022-12-25 (×2): qty 0.4

## 2022-12-25 MED ORDER — SERTRALINE HCL 100 MG PO TABS
100.0000 mg | ORAL_TABLET | Freq: Every day | ORAL | Status: DC
  Administered 2022-12-26 – 2022-12-27 (×2): 100 mg via ORAL
  Filled 2022-12-25 (×2): qty 1

## 2022-12-25 MED ORDER — LUBIPROSTONE 8 MCG PO CAPS
8.0000 ug | ORAL_CAPSULE | Freq: Every day | ORAL | Status: DC
  Administered 2022-12-26 – 2022-12-27 (×2): 8 ug via ORAL
  Filled 2022-12-25 (×2): qty 1

## 2022-12-25 MED ORDER — PRAVASTATIN SODIUM 40 MG PO TABS
40.0000 mg | ORAL_TABLET | Freq: Every day | ORAL | Status: DC
  Administered 2022-12-25 – 2022-12-26 (×2): 40 mg via ORAL
  Filled 2022-12-25 (×2): qty 1

## 2022-12-25 MED ORDER — ACETAMINOPHEN 325 MG PO TABS
650.0000 mg | ORAL_TABLET | Freq: Four times a day (QID) | ORAL | Status: DC | PRN
  Administered 2022-12-25 – 2022-12-27 (×3): 650 mg via ORAL
  Filled 2022-12-25 (×3): qty 2

## 2022-12-25 MED ORDER — ATROPINE SULFATE 1 MG/10ML IJ SOSY
PREFILLED_SYRINGE | INTRAMUSCULAR | Status: AC
  Filled 2022-12-25: qty 10

## 2022-12-25 NOTE — H&P (Signed)
Date: 12/25/2022               Patient Name:  Shelley Mitchell MRN: [id number]  DOB: Jun 26, 1941 Age / Sex: 82 y.o., female   PCP: [name], MD         Medical Service: Internal Medicine Teaching Service         Attending Physician: Dr. Aldine Contes, MD    First Contact: Nani Gasser, MD      Pager: [phone]      Second Contact: Buddy Duty, DO      Pager: RA [phone]           After Hours (After 5p/  First Contact Pager: [phone]  weekends / holidays): Second Contact Pager: [phone]   SUBJECTIVE   Chief Complaint: Dizziness   History of Present Illness:  Shelley Mitchell is an 82 year old female with a past medical history of hypertension and hyperlipidemia who presents for dizziness. She was recently admitted to [facility] on 1-18 for symptomatic bradycardia attributed to clonidine and discharged 1-19 in stable condition.  Patient reports feeling okay when she left the hospital the night of 1-19, but starting to experience dizziness and lightheadedness upon waking up the next morning on 1-20. She also describes a sharp pain that traveled up the left side of her neck into her head. This only lasted for a few seconds and returned again once she arrived at the hospital. When [facility] staff checked her BP, the systolic value was around 274mHg and HR was low. Per discharge instructions, the SNF staff withheld clonidine and nifedipine given her low HR. She does believe that she took hydrochlorothiazide this morning per usual. While she was in the ED, she developed a headache. Additional symptoms include constipation which is not new for her, some mild nausea, and postural lightheadedness. Denies vomiting, diarrhea, blurry vision, and chest pain.   ED Course: Upon arrival to the ED, vital signs were notable for BP 175/72 and HR 50. Laboratory testing demonstrated Gluc 104, Hgb 11.7, and negative Trop. 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 Bolus of 1L NS plus hydralazine and hydrochlorothiazide were administered in the ED, blood pressure has remained elevated.   Meds:  Current Meds  Medication Sig   acetaminophen (TYLENOL) 500 MG tablet Take 1,000 mg by mouth every 12 (twelve) hours.   albuterol (VENTOLIN HFA) 108 (90 Base) MCG/ACT inhaler Inhale 2 puffs into the lungs every 8 (eight) hours as needed for wheezing or shortness of breath.   Amino Acids-Protein Hydrolys (PRO-STAT AWC) LIQD Take 30 mLs by mouth 2 (two) times daily between meals.   aspirin EC 81 MG tablet Take 81 mg by mouth daily. Swallow whole.   furosemide (LASIX) 20 MG tablet Take 20 mg by mouth daily.   hydrochlorothiazide (HYDRODIURIL) 25 MG tablet Take 1 tablet (25 mg total) by mouth daily.   latanoprost (XALATAN) 0.005 % ophthalmic solution Place 1 drop into both eyes at bedtime.   lubiprostone (AMITIZA) 8 MCG capsule Take 1 capsule (8 mcg total) by mouth daily with breakfast.   medroxyPROGESTERone (PROVERA) 10 MG tablet TAKE 1 TABLET BY MOUTH DAILY 10 DAYS PER MONTH (Patient taking differently: Take 10 mg by mouth See admin instructions. Take 10 mg once daily on the 1st-10th day of each month)   Multiple Vitamins-Minerals (MULTIVITAMIN WITH MINERALS) tablet Take 1 tablet by mouth daily.   NIFEdipine (PROCARDIA XL/NIFEDICAL-XL) 90 MG 24 hr tablet Take 90  mg by mouth daily.   polyethylene glycol powder (GLYCOLAX/MIRALAX) 17 GM/SCOOP powder Take 17 g by mouth daily.   pravastatin (PRAVACHOL) 40 MG tablet Take 40 mg by mouth at bedtime.   sertraline (ZOLOFT) 100 MG tablet Take 100 mg by mouth daily.    Past Medical History  Past Surgical History:  Procedure Laterality Date   BACK SURGERY     bilateral knee replacements  2002/2003   CHOLECYSTECTOMY     COLONOSCOPY  03/2002   Dr. Jamesetta Geralds internal hemorrhoids   COLONOSCOPY  06/21/2011   polyp at IC valve (tubular adenoma)   COLONOSCOPY N/A 07/15/2016   one 8 mm  polyp at hepatic flexure (tubular adenoma), on 12 mm polyp at hepatic flexure (tubular adenoma), 3 year surveillance if health permits   COLONOSCOPY N/A 10/10/2019   descending colon diverticulosis, two 4-5 mm polyps at splenic flexure (tubular adenomas). No surveillance due to age.    LUMBAR LAMINECTOMY/DECOMPRESSION MICRODISCECTOMY N/A 04/27/2022   Procedure: Thoracic Eleven-Twelve Decompression;  Surgeon: Ashok Pall, MD;  Location: [facility];  Service: Neurosurgery;  Laterality: N/A;   POLYPECTOMY  07/15/2016   Procedure: POLYPECTOMY;  Surgeon: Daneil Dolin, MD;  [facility];  Service: Endoscopy;;  Splenic Flexure polyps x 2 removed via hot snare   POLYPECTOMY  10/10/2019   Procedure: POLYPECTOMY;  Surgeon: Daneil Dolin, MD;  [facility]: [facility];  Service: Endoscopy;;   TUBAL LIGATION       Social:  Lives With: SNF at [facility] Occupation: Retired, previously Systems analyst Support: family, son in [place] and [place] Level of Function: dependent in all ADLs and IADLs PCP: [name] MD Substances: none    Family History:  Family History  Problem Relation Age of Onset   Diabetes Brother    Colon polyps Brother    Diabetes Sister    Cancer Father        Likely prostate   Stroke Mother 32   Colon cancer Neg Hx       Allergies: Allergies as of 12/25/2022 - Review Complete 12/25/2022  Allergen Reaction Noted   Ace inhibitors Itching 03/17/2018   Nsaids Itching 03/17/2018      Review of Systems: A complete ROS was negative except as per HPI.    OBJECTIVE:   Physical Exam: Blood pressure (!) 195/64, pulse (!) 47, temperature 98.3 F (36.8 C), temperature source Oral, resp. rate 20, SpO2 99 %.   General:      awake and alert, lying comfortably in bed, cooperative and pleasant, not in acute distress Skin:       warm and dry, decreased skin turgor Eyes:      extraocular movements intact, conjunctivae pink, pupils round Lungs:       normal respiratory effort, breathing unlabored, distant breath sounds, no crackles or wheezing Cardiac:      bradycardic with regular rhythm, normal S1 and S2, capillary refill 1-2 seconds, no pitting edema Abdomen:      soft and non-distended, normoactive bowel sounds present in all four quadrants, no tenderness to palpation or guarding Musculoskeletal:  full range of motion in joints, motor strength 5/5 RUE 5/5 RLE 4+/5 L elbow flexion and 4+/5 L dorsiflexion Neurologic:      oriented to person-place-time, moving all extremities, sensation to light touch intact and symmetrical in BUEs BLEs, no gross focal deficits Psychiatric:      euthymic mood with congruent affect, intelligible speech   Labs: CBC    Component Value Date/Time  WBC 5.9 12/25/2022 1222   RBC 4.13 12/25/2022 1222   HGB 11.7 (L) 12/25/2022 1222   HCT 35.0 (L) 12/25/2022 1222   PLT 200 12/25/2022 1222   MCV 84.7 12/25/2022 1222   MCH 28.3 12/25/2022 1222   MCHC 33.4 12/25/2022 1222   RDW 16.1 (H) 12/25/2022 1222   LYMPHSABS 1.4 12/25/2022 1222   MONOABS 0.6 12/25/2022 1222   EOSABS 0.2 12/25/2022 1222   BASOSABS 0.0 12/25/2022 1222     CMP     Component Value Date/Time   NA 139 12/25/2022 1222   K 3.8 12/25/2022 1222   CL 103 12/25/2022 1222   CO2 29 12/25/2022 1222   GLUCOSE 104 (H) 12/25/2022 1222   BUN 18 12/25/2022 1222   CREATININE 0.56 12/25/2022 1222   CALCIUM 9.1 12/25/2022 1222   PROT 5.9 (L) 04/30/2022 0140   ALBUMIN 2.4 (L) 04/30/2022 0140   AST 44 (H) 04/30/2022 0140   ALT 55 (H) 04/30/2022 0140   ALKPHOS 71 04/30/2022 0140   BILITOT 0.3 04/30/2022 0140   GFRNONAA >60 12/25/2022 1222     Imaging:  CT ANGIO HEAD NECK W WO CM Result Date: 12/25/2022 IMPRESSION: 1. No large vessel occlusion. 2. Moderate bilateral intradural vertebral artery stenosis. 3. 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   ECG: My personal interpretation is first-degree AV block,  which is new from prior ECG on 12-24-2022.    ASSESSMENT & PLAN:   Assessment & Plan by Problem: Principal Problem:   Dizziness   Shelley Rawdon is an 82 year old female with a past medical history of hypertension and hyperlipidemia who presents for dizziness, now admitted for observation and symptomatic management in the setting of moderate bilateral vertebral artery stenosis.   ---Moderate bilateral intradural vertebral artery stenosis ---Bradycardia with first-degree atrioventricular block Patient recently admitted to [facility] for symptomatic bradycardia attributed to clonidine, which was discontinued and her heart rate stabilized in 50-60s range. She returned to [facility] on 1-19 with instructions to avoid clonidine, restart hydrochlorothiazide, and follow up with cardiology. After discharge, she felt okay but then became dizzy and her BP was elevated to 970 systolic. Upon arrival to the ED, electrocardiogram demonstrated first-degree atrioventricular block consistent with prior study on 1-19. Head and neck CT angiogram revealed 40% stenosis of left ICA and moderate bilateral vertebral artery stenosis. Etiology of dizziness most likely cerebral hypoperfusion secondary to first-degree AV block causing bradycardia and moderate VA stenosis. Primary team will permit systolic BP of 263ZCHY to ensure adequate cerebral perfusion and minimize symptoms. > Hold furosemide, hydrochlorothiazide, and nifedipine  > Check Mg > Trend CBC q24 > Consider vascular surgery consult tomorrow 1-21   ---Hypertension ---Hyperlipidemia Patient has history of hypertension managed at home with hydrochlorothiazide and nifedipine. Also takes furosemide to treat lower extremity swelling and pravastatin for hyperlipidemia. During recent hospitalization the patient's blood pressure, which was elevated to 850Y systolic at arrival, normalized after administration of hydrochlorothiazide. Clonidine and nifedipine were held  throughout hospitalization. She returned on 1-20 and her BP was elevated to 170-190s range with poor response to hydrochlorothiazide and hydralazine. Primary team will hold her home antihypertensive medications to ensure adequate cerebral perfusion in setting of moderate bilateral vertebral artery stenosis. Goal systolic BP is less than 774JOIN. > Pravastatin '40mg'$  q24 > Hydralazine '25mg'$  PO if systolic blood pressure exceeds 260mHg > Trend BMP q24 > Hold furosemide, hydrochlorothiazide, and nifedipine    ---Constipation Patient has a history of constipation managed at  home with lubiprostone and polyethylene glycol. > Lubiprostone 8ug q24 > Polyethylene glycol 17g q24   ---Depression Patient has a history of depression managed at home with sertraline, which she takes daily.  > Sertraline '100mg'$  q24    Diet: Normal VTE: Enoxaparin IVF: NS,Bolus Code: DNR  Prior to Admission Living Arrangement: SNF, [facility] Anticipated Discharge Location: SNF Barriers to Discharge: symptomatic improvement  Dispo: Admit patient to Observation with expected length of stay less than 2 midnights.  Signed: Serita Butcher, MD Internal Medicine Resident PGY-1  12/25/2022, 11:29 PM

## 2022-12-25 NOTE — ED Provider Triage Note (Signed)
Emergency Medicine Provider Triage Evaluation Note  Shelley Mitchell , a 82 y.o. female  was evaluated in triage.  Pt complains of bradycardia and hypertension.  This started yesterday, seen in the ED and discharged home back today because blood pressure was high according to patient.  She is having pain under her left breast, no nausea, vomiting, lower extremity swelling.  Denies syncope  Review of Systems  Per HPI  Physical Exam  There were no vitals taken for this visit. Gen:   Awake, no distress   Resp:  Normal effort  MSK:   Moves extremities without difficulty  Other:  Bradycardic with regular rhythm, upper and lower extremity pulses symmetric bilaterally  Medical Decision Making  Medically screening exam initiated at 12:18 PM.  Appropriate orders placed.  Kyasia Steuck [place]um was informed that the remainder of the evaluation will be completed by another provider, this initial triage assessment does not replace that evaluation, and the importance of remaining in the ED until their evaluation is complete.     Sherrill Raring, PA-C 12/25/22 1219

## 2022-12-25 NOTE — ED Notes (Signed)
Pt was transferred from Triage, pt is in room on the monitor and family member at bedside

## 2022-12-25 NOTE — ED Triage Notes (Signed)
Pt BIB [facility] from [facility] with reports of bradycardia, dizziness, and nausea. Pt D/C from ER last night for same.

## 2022-12-25 NOTE — ED Provider Notes (Signed)
  Physical Exam  BP (!) 177/61   Pulse (!) 45   Temp 98.3 F (36.8 C) (Oral)   Resp 16   SpO2 100%   Physical Exam  Procedures  Procedures  ED Course / MDM    Medical Decision Making Care assumed at 3 PM.  Patient was admitted for hypertension and bradycardia.  Patient had an echo that was unremarkable.  Patient went back to facility today and felt lightheaded and dizzy and came back.  Patient's blood pressure was over 200 initially.  Patient did not take her hydrochlorothiazide today.  Signed out pending CTA head and neck  7:16 PM Reviewed patient's labs and CTA.  CTA showed 40% stenosis left ICA.  Patient's blood pressure went down to the 170s and went back up to the 190s even with hydralazine.  She remained bradycardic with a heart rate in the 40s.  She may still have symptomatic bradycardia versus symptomatic hypertension.  I reviewed her records from recent admission and on discharge yesterday, her blood pressure was in the 120s.  At this point we will admit for observation under internal medicine teaching service.  Problems Addressed: Bradycardia: acute illness or injury Hypertension, unspecified type: acute illness or injury Lightheadedness: acute illness or injury  Amount and/or Complexity of Data Reviewed Radiology: ordered.  Risk Prescription drug management.          Drenda Freeze, MD 12/25/22 [phone]

## 2022-12-25 NOTE — ED Provider Notes (Signed)
[facility] Provider Note   CSN: [id number] Arrival date & time: 12/25/22  1155     History  Chief Complaint  Patient presents with   Bradycardia    Elnore EARLENA WERST is a 82 y.o. female w/ hx of HTN, HLD presenting to ED with elevated blood pressure, bradycardia, nausea.  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  She also ports that she has had left-sided posterior neck pain that often occurs in the setting of her lightheadedness.  Patient does report that she cannot drink a lot of fluids typically.  She does drink juice and soda.  She does feel like she has dry mouth.  HPI     Home Medications Prior to Admission medications   Medication Sig Start Date End Date Taking? Authorizing Provider  acetaminophen (TYLENOL) 500 MG tablet Take 100 mg by mouth 2 (two) times daily.    [provider]  albuterol (VENTOLIN HFA) 108 (90 Base) MCG/ACT inhaler Inhale 2 puffs into the lungs every 8 (eight) hours as needed for wheezing or shortness of breath.    [provider]  Amino Acids-Protein Hydrolys (PRO-STAT AWC) LIQD Take 30 mLs by mouth in the morning and at bedtime.    [provider]  aspirin 81 MG EC tablet Take 81 mg by mouth daily.    [provider]  furosemide (LASIX) 20 MG tablet Take 20 mg by mouth daily.    [provider]  guaiFENesin (ROBITUSSIN) 100 MG/5ML liquid Take 10 mLs by mouth every 4 (four) hours as needed for cough.    [provider]  hydrochlorothiazide (HYDRODIURIL) 25 MG tablet Take 1 tablet (25 mg total) by mouth daily. 04/30/22   Thurnell Lose, MD  latanoprost (XALATAN) 0.005 % ophthalmic solution Place 1 drop into both eyes at bedtime. 01/08/21   [provider]  lubiprostone (AMITIZA) 8 MCG capsule Take 1 capsule (8 mcg  total) by mouth daily with breakfast. 12/13/19   Annitta Needs, NP  medroxyPROGESTERone (PROVERA) 10 MG tablet TAKE 1 TABLET BY MOUTH DAILY 10 DAYS PER MONTH Patient taking differently: Take 10 mg by mouth See admin instructions. Take for 10 days per month. 01/13/22   Florian Buff, MD  Multiple Vitamins-Minerals (MULTIVITAMIN WITH MINERALS) tablet Take 1 tablet by mouth daily.    [provider]  polyethylene glycol (MIRALAX / GLYCOLAX) 17 g packet Take 17 g by mouth daily.    [provider]  pravastatin (PRAVACHOL) 40 MG tablet Take 40 mg by mouth daily. 05/12/11   [provider]  sertraline (ZOLOFT) 100 MG tablet Take 100 mg by mouth daily.    [provider]      Allergies    Ace inhibitors and Nsaids    Review of Systems   Review of Systems  Physical Exam Updated Vital Signs BP (!) 182/71   Pulse (!) 47   Temp 98.2 F (36.8 C)   Resp 11   SpO2 99%  Physical Exam Constitutional:      General: She is not in acute distress. HENT:     Head: Normocephalic and atraumatic.  Eyes:     Conjunctiva/sclera: Conjunctivae normal.     Pupils: Pupils are equal, round, and reactive to light.  Cardiovascular:     Rate and Rhythm: Regular rhythm. Bradycardia present.  Pulmonary:     Effort: Pulmonary effort is normal. No respiratory distress.  Abdominal:     General: There is no distension.     Tenderness: There is no abdominal tenderness.  Skin:    General: Skin is warm and dry.  Neurological:     General: No focal deficit present.     Mental Status: She is alert. Mental status is at baseline.  Psychiatric:        Mood and Affect: Mood normal.        Behavior: Behavior normal.     ED Results / Procedures / Treatments   Labs (all labs ordered are listed, but only abnormal results are displayed) Labs Reviewed  BASIC METABOLIC PANEL - Abnormal; Notable for the following components:      Result Value   Glucose, Bld 104 (*)    All other  components within normal limits  CBC WITH DIFFERENTIAL/PLATELET - Abnormal; Notable for the following components:   Hemoglobin 11.7 (*)    HCT 35.0 (*)    RDW 16.1 (*)    All other components within normal limits  TROPONIN I (HIGH SENSITIVITY)    EKG EKG Interpretation  Date/Time:  Saturday December 25 2022 12:12:49 EST Ventricular Rate:  48 PR Interval:  286 QRS Duration: 100 QT Interval:  478 QTC Calculation: 427 R Axis:   1 Text Interpretation: Sinus bradycardia with 1st degree A-V block Septal infarct , age undetermined Abnormal ECG When compared with ECG of 24-Dec-2022 13:27, PREVIOUS ECG IS PRESENT No significant changes Confirmed by Octaviano Glow [phone]) on 12/25/2022 1:33:05 PM  Radiology ECHOCARDIOGRAM COMPLETE  Result Date: 12/24/2022    ECHOCARDIOGRAM REPORT   Patient Name:   KARA MELCHING Date of Exam: 12/24/2022 Medical Rec #:  [id number]    Height:       62.0 in Accession #:    [id number]   Weight:       242.0 lb Date of Birth:  04/22/1941    BSA:          2.073 m Patient Age:    19 years     BP:           136/86 mmHg Patient Gender: F            HR:           41 bpm. Exam Location:  Inpatient Procedure: 2D Echo Indications:    symptomatic bradycardia  History:        Patient has prior history of Echocardiogram examinations, most                 recent 03/08/2022. Risk Factors:Sleep Apnea, Hypertension and                 Dyslipidemia.  Sonographer:    Johny Chess RDCS Referring Phys: [id number] [facility] NARENDRA  Sonographer Comments: Patient is obese. Image acquisition challenging due to patient body habitus. IMPRESSIONS  1. Left ventricular ejection fraction, by estimation, is 60 to 65%. The left ventricle has normal function. The left ventricle has no regional wall motion abnormalities. There is mild left ventricular hypertrophy. Left ventricular diastolic parameters are consistent with Grade I diastolic dysfunction (impaired relaxation).  2. Right ventricular systolic  function is normal. The right ventricular size is normal. There is normal pulmonary artery systolic pressure. The estimated right ventricular systolic pressure is 78.4 mmHg.  3. The mitral valve is normal in structure. Trivial mitral valve regurgitation. No evidence of mitral stenosis.  4. The  aortic valve is tricuspid. Aortic valve regurgitation is not visualized. Aortic valve sclerosis/calcification is present, without any evidence of aortic stenosis.  5. The inferior vena cava is normal in size with greater than 50% respiratory variability, suggesting right atrial pressure of 3 mmHg. FINDINGS  Left Ventricle: Left ventricular ejection fraction, by estimation, is 60 to 65%. The left ventricle has normal function. The left ventricle has no regional wall motion abnormalities. The left ventricular internal cavity size was normal in size. There is  mild left ventricular hypertrophy. Left ventricular diastolic parameters are consistent with Grade I diastolic dysfunction (impaired relaxation). Right Ventricle: The right ventricular size is normal. No increase in right ventricular wall thickness. Right ventricular systolic function is normal. There is normal pulmonary artery systolic pressure. The tricuspid regurgitant velocity is 2.52 m/s, and  with an assumed right atrial pressure of 3 mmHg, the estimated right ventricular systolic pressure is 40.1 mmHg. Left Atrium: Left atrial size was normal in size. Right Atrium: Right atrial size was normal in size. Pericardium: There is no evidence of pericardial effusion. Mitral Valve: The mitral valve is normal in structure. Trivial mitral valve regurgitation. No evidence of mitral valve stenosis. Tricuspid Valve: The tricuspid valve is normal in structure. Tricuspid valve regurgitation is trivial. Aortic Valve: The aortic valve is tricuspid. Aortic valve regurgitation is not visualized. Aortic valve sclerosis/calcification is present, without any evidence of aortic stenosis.  Pulmonic Valve: The pulmonic valve was not well visualized. Pulmonic valve regurgitation is not visualized. Aorta: The aortic root is normal in size and structure. Venous: The inferior vena cava is normal in size with greater than 50% respiratory variability, suggesting right atrial pressure of 3 mmHg. IAS/Shunts: The interatrial septum was not well visualized.  LEFT VENTRICLE PLAX 2D LVIDd:         4.10 cm   Diastology LVIDs:         2.50 cm   LV e' medial:    6.42 cm/s LV PW:         1.30 cm   LV E/e' medial:  10.2 LV IVS:        1.00 cm   LV e' lateral:   8.49 cm/s LVOT diam:     1.90 cm   LV E/e' lateral: 7.7 LV SV:         72 LV SV Index:   35 LVOT Area:     2.84 cm  RIGHT VENTRICLE             IVC RV S prime:     17.60 cm/s  IVC diam: 1.40 cm TAPSE (M-mode): 2.5 cm LEFT ATRIUM           Index LA diam:      3.10 cm 1.50 cm/m LA Vol (A4C): 61.2 ml 29.52 ml/m  AORTIC VALVE LVOT Vmax:   104.00 cm/s LVOT Vmean:  65.500 cm/s LVOT VTI:    0.253 m  AORTA Ao Root diam: 2.80 cm MITRAL VALVE               TRICUSPID VALVE MV Area (PHT): 2.52 cm    TR Peak grad:   25.4 mmHg MV Decel Time: 301 msec    TR Vmax:        252.00 cm/s MV E velocity: 65.20 cm/s MV A velocity: 87.20 cm/s  SHUNTS MV E/A ratio:  0.75        Systemic VTI:  0.25 m  Systemic Diam: 1.90 cm Oswaldo Milian MD Electronically signed by Oswaldo Milian MD Signature Date/Time: 12/24/2022/11:44:43 AM    Final     Procedures Procedures    Medications Ordered in ED Medications  sodium chloride 0.9 % bolus 1,000 mL (1,000 mLs Intravenous New Bag/Given 12/25/22 1443)    ED Course/ Medical Decision Making/ A&P                             Medical Decision Making Amount and/or Complexity of Data Reviewed Radiology: ordered.   This patient presents to the ED with concern for lightheadedness, neck pain. This involves an extensive number of treatment options, and is a complaint that carries with it a high risk of  complications and morbidity.  The differential diagnosis includes vascular injury versus arrhythmia versus anemia versus other  She has had a robust workup for near syncope in the hospital overnight yesterday, including echocardiogram which did not show any emergent findings.  Her telemetry shows a persistent bradycardia with a heart rate 40 to 50 bpm.  Per my review of external records, including her EKGs performed over the past several years I do, she has a resting bradycardia.  I do not suspect she is symptomatic or lightheaded from this heart rate.  But I do think is reasonable to perform further workup in particular to look for vertebral or vascular injury, given her associated neck pain and lightheadedness.  Co-morbidities that complicate the patient evaluation: Patient is bedbound at her facility and high risk of dysautonomia  Additional history obtained from patient's son at bedside  External records from outside source obtained and reviewed including echocardiogram from yesterday and hospital discharge summary  I ordered and personally interpreted labs.  The pertinent results include: No emergent findings.  Specifically no acute anemia or electrolyte derangement at this time.  I ordered imaging studies including CT angiogram of the head and neck Patient's imaging is pending at the time of signout  The patient was maintained on a cardiac monitor.  I personally viewed and interpreted the cardiac monitored which showed an underlying rhythm of: Sinus bradycardia , HR 45-50 bpm  Per my interpretation the patient's ECG shows sinus bradycardia with first-degree heart block but no acute ischemic findings  I ordered medication including IV fluid bolus for hydration  I have reviewed the patients home medicines and have made adjustments as needed  Test Considered: Low suspicion for acute PE and do not see an indication for pulmonary embolism scan at this time    Dispostion:  Patient signed  out to Dr Darl Householder EDP pending f/u on CT imaging.  If no significant vascular findings patient can be discharged back to her facility with continued cardiology outpatient follow-up         Final Clinical Impression(s) / ED Diagnoses Final diagnoses:  Bradycardia  Lightheadedness    Rx / DC Orders ED Discharge Orders     None         Cerys Winget, Carola Rhine, MD 12/25/22 1531

## 2022-12-25 NOTE — ED Notes (Signed)
Patient is here due to uncontrolled high blood pressure. Pt denies any headache or blurry vision.

## 2022-12-26 DIAGNOSIS — R42 Dizziness and giddiness: Secondary | ICD-10-CM

## 2022-12-26 DIAGNOSIS — R001 Bradycardia, unspecified: Secondary | ICD-10-CM | POA: Diagnosis not present

## 2022-12-26 DIAGNOSIS — I10 Essential (primary) hypertension: Secondary | ICD-10-CM

## 2022-12-26 LAB — BASIC METABOLIC PANEL
Anion gap: 10 (ref 5–15)
BUN: 13 mg/dL (ref 8–23)
CO2: 24 mmol/L (ref 22–32)
Calcium: 9.1 mg/dL (ref 8.9–10.3)
Chloride: 102 mmol/L (ref 98–111)
Creatinine, Ser: 0.59 mg/dL (ref 0.44–1.00)
GFR, Estimated: >60 mL/min (ref >60)
Glucose, Bld: 94 mg/dL (ref 70–99)
Potassium: 3.3 mmol/L — ABNORMAL LOW (ref 3.5–5.1)
Sodium: 136 mmol/L (ref 135–145)

## 2022-12-26 LAB — CBC
HCT: 36.6 % (ref 36.0–46.0)
Hemoglobin: 11.7 g/dL — ABNORMAL LOW (ref 12.0–15.0)
MCH: 27.6 pg (ref 26.0–34.0)
MCHC: 32 g/dL (ref 30.0–36.0)
MCV: 86.3 fL (ref 80.0–100.0)
Platelets: 209 10*3/uL (ref 150–400)
RBC: 4.24 MIL/uL (ref 3.87–5.11)
RDW: 16.4 % — ABNORMAL HIGH (ref 11.5–15.5)
WBC: 7.3 10*3/uL (ref 4.0–10.5)
nRBC: 0 % (ref 0.0–0.2)

## 2022-12-26 LAB — MAGNESIUM: Magnesium: 2 mg/dL (ref 1.7–2.4)

## 2022-12-26 MED ORDER — AMLODIPINE BESYLATE 5 MG PO TABS
5.0000 mg | ORAL_TABLET | Freq: Every day | ORAL | Status: DC
  Administered 2022-12-26 – 2022-12-27 (×2): 5 mg via ORAL
  Filled 2022-12-26 (×2): qty 1

## 2022-12-26 MED ORDER — POTASSIUM CHLORIDE 20 MEQ PO PACK
40.0000 meq | PACK | Freq: Two times a day (BID) | ORAL | Status: AC
  Administered 2022-12-26 (×2): 40 meq via ORAL
  Filled 2022-12-26 (×2): qty 2

## 2022-12-26 NOTE — Progress Notes (Signed)
Discussed CT agio head and neck with Dr. Trula Slade with vascular surgery, who reviewed her imaging. Her ICA stenosis or vertebral artery stenosis is unlikely the source of her dizziness.  No interventions recommended for vertebral artery stenosis. Offered follow up in vascular clinic as outpatient only if patient is interested.

## 2022-12-26 NOTE — ED Notes (Signed)
ED TO INPATIENT HANDOFF REPORT  ED Nurse Name and Phone #: [phone]  S Name/Age/Gender Shelley Mitchell 82 y.o. female Room/Bed: 009C/009C  Code Status   Code Status: DNR  Home/SNF/Other Skilled nursing facility Patient oriented to: self, place, time, and situation Is this baseline? Yes   Triage Complete: Triage complete  Chief Complaint Dizziness [R42]  Triage Note Pt BIB [facility] from [facility] with reports of bradycardia, dizziness, and nausea. Pt D/C from ER last night for same.    Allergies Allergies  Allergen Reactions   Ace Inhibitors Itching   Nsaids Itching    Level of Care/Admitting Diagnosis ED Disposition     ED Disposition  Admit   Condition  --   Comment  Hospital Area: [facility] [[id number]]  Level of Care: Telemetry Medical [104]  May place patient in observation at [facility] or [facility] if equivalent level of care is available:: Yes  Covid Evaluation: Asymptomatic - no recent exposure (last 10 days) testing not required  Diagnosis: Dizziness [225750]  Admitting Physician: Aldine Contes [[id number]]  Attending Physician: Aldine Contes [phone]          B Medical/Surgery History Past Medical History:  Diagnosis Date   Endometrial polyp    Dr. Elonda Husky   History of endometrial hyperplasia    HTN (hypertension)    Hx of adenomatous colonic polyps 1999   Hyperlipidemia    Mental disorder    GAD   Obesity    Osteoarthritis    Tachycardia    subsequently with bradycardia (?secondary to meds)   Past Surgical History:  Procedure Laterality Date   BACK SURGERY     bilateral knee replacements  2002/2003   CHOLECYSTECTOMY     COLONOSCOPY  03/2002   Dr. Jamesetta Geralds internal hemorrhoids   COLONOSCOPY  06/21/2011   polyp at IC valve (tubular adenoma)   COLONOSCOPY N/A 07/15/2016   one 8 mm polyp at hepatic flexure (tubular adenoma), on 12 mm polyp at hepatic flexure (tubular adenoma), 3 year surveillance if health  permits   COLONOSCOPY N/A 10/10/2019   descending colon diverticulosis, two 4-5 mm polyps at splenic flexure (tubular adenomas). No surveillance due to age.    LUMBAR LAMINECTOMY/DECOMPRESSION MICRODISCECTOMY N/A 04/27/2022   Procedure: Thoracic Eleven-Twelve Decompression;  Surgeon: Ashok Pall, MD;  Location: [facility];  Service: Neurosurgery;  Laterality: N/A;   POLYPECTOMY  07/15/2016   Procedure: POLYPECTOMY;  Surgeon: Daneil Dolin, MD;  [facility];  Service: Endoscopy;;  Splenic Flexure polyps x 2 removed via hot snare   POLYPECTOMY  10/10/2019   Procedure: POLYPECTOMY;  Surgeon: Daneil Dolin, MD;  [facility];  Service: Endoscopy;;   TUBAL LIGATION       A IV Location/Drains/Wounds Patient Lines/Drains/Airways Status     Active Line/Drains/Airways     Name Placement date Placement time Site Days   Peripheral IV 12/25/22 20 G Anterior;Proximal;Right Forearm 12/25/22  1443  Forearm  1            Intake/Output Last 24 hours  Intake/Output Summary (Last 24 hours) at 12/26/2022 1609 Last data filed at 12/26/2022 0522 Gross per 24 hour  Intake 100 ml  Output 500 ml  Net -400 ml    Labs/Imaging Results for orders placed or performed during the hospital encounter of 12/25/22 (from the past 48 hour(s))  Basic metabolic panel     Status: Abnormal   Collection Time: 12/25/22 12:22 PM  Result Value Ref Range  Sodium 139 135 - 145 mmol/L   Potassium 3.8 3.5 - 5.1 mmol/L   Chloride 103 98 - 111 mmol/L   CO2 29 22 - 32 mmol/L   Glucose, Bld 104 (H) 70 - 99 mg/dL    Comment: Glucose reference range applies only to samples taken after fasting for at least 8 hours.   BUN 18 8 - 23 mg/dL   Creatinine, Ser 0.56 0.44 - 1.00 mg/dL   Calcium 9.1 8.9 - 10.3 mg/dL   GFR, Estimated >60 >60 mL/min    Comment: (NOTE) Calculated using the CKD-EPI Creatinine Equation (2021)    Anion gap 7 5 - 15    Comment: Performed at [facility] [address]  [place], [place] [address]  CBC with Differential     Status: Abnormal   Collection Time: 12/25/22 12:22 PM  Result Value Ref Range   WBC 5.9 4.0 - 10.5 K/uL   RBC 4.13 3.87 - 5.11 MIL/uL   Hemoglobin 11.7 (L) 12.0 - 15.0 g/dL   HCT 35.0 (L) 36.0 - 46.0 %   MCV 84.7 80.0 - 100.0 fL   MCH 28.3 26.0 - 34.0 pg   MCHC 33.4 30.0 - 36.0 g/dL   RDW 16.1 (H) 11.5 - 15.5 %   Platelets 200 150 - 400 K/uL   nRBC 0.0 0.0 - 0.2 %   Neutrophils Relative % 61 %   Neutro Abs 3.7 1.7 - 7.7 K/uL   Lymphocytes Relative 24 %   Lymphs Abs 1.4 0.7 - 4.0 K/uL   Monocytes Relative 11 %   Monocytes Absolute 0.6 0.1 - 1.0 K/uL   Eosinophils Relative 3 %   Eosinophils Absolute 0.2 0.0 - 0.5 K/uL   Basophils Relative 1 %   Basophils Absolute 0.0 0.0 - 0.1 K/uL   Immature Granulocytes 0 %   Abs Immature Granulocytes 0.01 0.00 - 0.07 K/uL    Comment: Performed at [facility] [address], [place], [place] [address]  Troponin I (High Sensitivity)     Status: None   Collection Time: 12/25/22 12:22 PM  Result Value Ref Range   Troponin I (High Sensitivity) 15 <18 ng/L    Comment: (NOTE) Elevated high sensitivity troponin I (hsTnI) values and significant  changes across serial measurements may suggest ACS but many other  chronic and acute conditions are known to elevate hsTnI results.  Refer to the Links section for chest pain algorithms and additional  guidance. Performed at [facility], [place] [address], [place], [place] [address]   Magnesium     Status: None   Collection Time: 12/26/22  3:28 AM  Result Value Ref Range   Magnesium 2.0 1.7 - 2.4 mg/dL    Comment: Performed at [facility] [address] [place], [place] [address]  CBC     Status: Abnormal   Collection Time: 12/26/22  3:28 AM  Result Value Ref Range   WBC 7.3 4.0 - 10.5 K/uL   RBC 4.24 3.87 - 5.11 MIL/uL   Hemoglobin 11.7 (L) 12.0 - 15.0 g/dL   HCT 36.6 36.0 - 46.0 %   MCV 86.3 80.0 - 100.0 fL   MCH 27.6 26.0 -  34.0 pg   MCHC 32.0 30.0 - 36.0 g/dL   RDW 16.4 (H) 11.5 - 15.5 %   Platelets 209 150 - 400 K/uL   nRBC 0.0 0.0 - 0.2 %    Comment: Performed at [facility],  [address], [place], [place] [address]  Basic metabolic panel     Status: Abnormal   Collection Time: 12/26/22  3:28 AM  Result Value Ref Range   Sodium 136 135 - 145 mmol/L   Potassium 3.3 (L) 3.5 - 5.1 mmol/L   Chloride 102 98 - 111 mmol/L   CO2 24 22 - 32 mmol/L   Glucose, Bld 94 70 - 99 mg/dL    Comment: Glucose reference range applies only to samples taken after fasting for at least 8 hours.   BUN 13 8 - 23 mg/dL   Creatinine, Ser 0.59 0.44 - 1.00 mg/dL   Calcium 9.1 8.9 - 10.3 mg/dL   GFR, Estimated >60 >60 mL/min    Comment: (NOTE) Calculated using the CKD-EPI Creatinine Equation (2021)    Anion gap 10 5 - 15    Comment: Performed at [facility] [address] [place], [place] [address]   CT ANGIO HEAD NECK W WO CM  Result Date: 12/25/2022 CLINICAL DATA:  Transient ischemic attack (TIA) EXAM: CT ANGIOGRAPHY HEAD AND NECK TECHNIQUE: Multidetector CT imaging of the head and neck was performed using the standard protocol during bolus administration of intravenous contrast. Multiplanar CT image reconstructions and MIPs were obtained to evaluate the vascular anatomy. Carotid stenosis measurements (when applicable) are obtained utilizing NASCET criteria, using the distal internal carotid diameter as the denominator. RADIATION DOSE REDUCTION: This exam was performed according to the departmental dose-optimization program which includes automated exposure control, adjustment of the mA and/or kV according to patient size and/or use of iterative reconstruction technique. CONTRAST:  72m OMNIPAQUE IOHEXOL 350 MG/ML SOLN COMPARISON:  CT head 12/23/2022. FINDINGS: CT HEAD FINDINGS Brain: No evidence of acute large vascular territory infarction, hemorrhage, hydrocephalus, extra-axial collection or mass lesion/mass effect.  Enlarged and empty sella. Patchy white matter hypodensities, compatible with chronic microvascular ischemic disease. Vascular: See below. Skull: No acute fracture. Sinuses/Orbits: Remote right orbital floor blowout fracture. Clear sinuses. Review of the MIP images confirms the above findings CTA NECK FINDINGS Aortic arch: Great vessel origins are patent. Right carotid system: Retropharyngeal course. Patent without greater than 50% stenosis. Left carotid system: Retropharyngeal course. Calcific atherosclerosis at the carotid bifurcation with approximately 40% stenosis of the origin. Vertebral arteries: Bilateral vertebral arteries are patent without visible greater than 50% stenosis in the neck. Limited visualization the right vertebral artery origin due to streak artifact. Skeleton: Severe multilevel degenerative change negative Other neck: No acute findings on limited assessment. Upper chest: Mild scarring in the visualized lung apices without confluent consolidation. Review of the MIP images confirms the above findings CTA HEAD FINDINGS Anterior circulation: Mild for age bearing of the intracranial ICAs due to calcific atherosclerosis. Bilateral MCAs and ACAs are patent without proximal hemodynamically significant stenosis. Posterior circulation: Moderate stenosis of bilateral intradural vertebral arteries. The basilar artery and bilateral posterior cerebral arteries are patent without hemodynamically significant stenosis. Venous sinuses: As permitted by contrast timing, patent. Review of the MIP images confirms the above findings IMPRESSION: 1. No large vessel occlusion. 2. Moderate bilateral intradural vertebral artery stenosis. 3. 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 Electronically Signed   By: FMargaretha SheffieldM.D.   On: 12/25/2022 16:55    Pending Labs Unresulted Labs (From admission, onward)     Start     Ordered   12/27/22 0500  CBC  Tomorrow morning,   R        12/26/22  1255  12/27/22 1275  Basic metabolic panel  Tomorrow morning,   R        12/26/22 1255            Vitals/Pain Today's Vitals   12/26/22 0815 12/26/22 1200 12/26/22 1400 12/26/22 1415  BP:  (!) 179/70 (!) 152/60 (!) 151/69  Pulse: 70 (!) 46 (!) 52 (!) 56  Resp: 12 14 (!) 31 18  Temp:  98.3 F (36.8 C)    TempSrc:      SpO2: 100% 100% 98% 98%  PainSc:        Isolation Precautions No active isolations  Medications Medications  enoxaparin (LOVENOX) injection 40 mg (40 mg Subcutaneous Given 12/26/22 0954)  acetaminophen (TYLENOL) tablet 650 mg (650 mg Oral Given 12/25/22 2331)    Or  acetaminophen (TYLENOL) suppository 650 mg ( Rectal See Alternative 12/25/22 2331)  pravastatin (PRAVACHOL) tablet 40 mg (40 mg Oral Given 12/25/22 2331)  lubiprostone (AMITIZA) capsule 8 mcg (8 mcg Oral Given 12/26/22 0955)  sertraline (ZOLOFT) tablet 100 mg (100 mg Oral Given 12/26/22 0955)  polyethylene glycol (MIRALAX / GLYCOLAX) packet 17 g (17 g Oral Given 12/26/22 1000)  hydrALAZINE (APRESOLINE) tablet 25 mg (has no administration in time range)  potassium chloride (KLOR-CON) packet 40 mEq (40 mEq Oral Given 12/26/22 0954)  amLODipine (NORVASC) tablet 5 mg (5 mg Oral Given 12/26/22 1328)  sodium chloride 0.9 % bolus 1,000 mL (0 mLs Intravenous Stopped 12/25/22 1632)  iohexol (OMNIPAQUE) 350 MG/ML injection 75 mL (75 mLs Intravenous Contrast Given 12/25/22 1642)  hydrochlorothiazide (HYDRODIURIL) tablet 25 mg (25 mg Oral Given 12/25/22 1710)  hydrALAZINE (APRESOLINE) injection 5 mg (5 mg Intravenous Given 12/25/22 1803)    Mobility non-ambulatory     Focused Assessments Cardiac Assessment Handoff:  Cardiac Rhythm: Atrial fibrillation (bradycardia) Lab Results  Component Value Date   CKTOTAL 98 04/23/2022   No results found for: "DDIMER" Does the Patient currently have chest pain? No    R Recommendations: See Admitting Provider Note  Report given to:   Additional Notes:

## 2022-12-26 NOTE — Progress Notes (Addendum)
   Subjective:  Patient reports she is feeling better this morning.  No further episodes of dizziness.  Does note occasional left-sided neck pain with head movement in the last few days.  No further episodes of dizziness.  Objective:  Vital signs in last 24 hours: Vitals:   12/26/22 0315 12/26/22 0505 12/26/22 0512 12/26/22 0700  BP:  (!) 195/62  (!) 206/85  Pulse: (!) 45 (!) 50  (!) 44  Resp: '16 19  16  '$ Temp:   98.3 F (36.8 C)   TempSrc:   Oral   SpO2: 96% 100%  97%   Constitutional: Chronically ill appearing. No distress.  HENT: Normocephalic and atraumatic, EOMI Neck: normal ROM, no stiffness, no TTP Cardiovascular: bradycardiac to 40s, regular rhythm, no LE edema, Respiratory: No respiratory distress, no accessory muscle use.  Effort is normal.  Lungs are clear to auscultation bilaterally. GI: Nondistended, soft, nontender to palpation, normal active bowel sounds Musculoskeletal: Normal bulk and tone.  No peripheral edema noted. Neurological: Is alert and oriented x4, no apparent focal deficits noted. Skin: Warm and dry.   Psychiatric: Normal mood and affect.   Assessment/Plan:  Principal Problem:   Dizziness  Bradycardia with first degree AV block Recent admission for the same thought to be due to clonidine and nifedipine which were stopped. However had another episode while off these. BP noted to be elevated to 200s. EKG with first degree AV block. Echo on 12/24/2022 with EF 60-65%, mild LVH, no regional wall motion abnormalities. Grade I DD. Normal RV systolic function. - cardiology consulted regarding bradycardia, recommended switching antihypertensive to amlodipine, no indication for PPM -stopped clonidine and nifedipine.  - monitor telemetry  Moderate bilateral intradural vertebral artery stenosis  Reports dizziness with sitting up and left neck pain with head movement. CT angiogram head and neck with 40% stenosis of the left ICE and moderate bilateral vertebral  artery stenosis. Possibly may be contributing to her dizziness and neck pain. Vascular surgery consulted by EDP  - Appreciate vascular surgery recommendations - Hold Furosemide and HCTZ  Hypertension BP elevated on admission to 200s now improved to 160s after getting home HCTZ Reports she has been on HCTZ at home but clonidine and nifedipine stopped after last admission. - amlodipine 5 mg daily - hydralazine 26 mg PRN SBP >220 - monitor BP  Hyperlipidemia Continue pravastatin 40 mg daily  Constipation -Lubiprostone 8ug q24 -Polyethylene glycol 17g q24  Depression -Continue sertraline  Diet: Normal VTE: Enoxaparin IVF: none Code: DNR  Prior to Admission Living Arrangement: Anticipated Discharge Location: Barriers to Discharge: Dispo: Anticipated discharge in approximately 1-2 day(s).   Iona Beard, MD 12/26/2022, 11:17 AM Pager: [phone] After 5pm on weekdays and 1pm on weekends: On Call pager [phone]

## 2022-12-26 NOTE — Consult Note (Addendum)
Cardiology Consultation   Patient ID: Shelley Mitchell MRN: [id number]; DOB: Aug 06, 1941  Admit date: 12/25/2022 Date of Consult: 12/26/2022  PCP:  [name], [facility] Cardiologist:  Kate Sable, MD        Patient Profile:   Shelley Mitchell is a 82 y.o. female with a hx of hypertension, hyperlipidemia who is being seen 12/26/2022 for the evaluation of bradycardia at the request of Dr. Jodi Mourning.  History of Present Illness:   [name] was evaluated by [name] in 2022 for shortness of breath and a murmur. An echocardiogram showed normal EF. She has AV sclerosis but no aortic stenosis.   She was admitted 1/18-1/19 for dizziness and bradycardia. Clonidine was DCd and her HR improved. Her Nifedipine was also stopped. She was readmitted yesterday with recurrent symptoms of dizziness. Her BP was also markedly elevated (979 systolic) at her living facility [facility]).  Notes indicate her BP here was 175/72.   Data: TTE 12/24/22: normal ejection fraction at 60-65, mild LVH, no WMA, NL PASP, AV sclerosis, no AS, RAP 3 K+ low at 3.3, SCr normal at 0.59, Hgb mildly low at 11.7 hsTrop normal x 1 (15) EKG sinus brady, HR 48, 1st degree AVB PR 286), no STTW changes, QTc 427 Head/neck CTA 12/25/22: mod bilat VA stenosis, 40% L ICA stenosis, no lg vessel occlusion  She is not currently dizzy. Her son is in the room with her. She lives at [facility]. She has not had syncope, near syncope. Dizziness is described as lightheadedness. She has not had vertiginous symptoms. She has not had chest pain, shortness of breath, orthopnea. She has chronic leg edema.   Past Medical History:  Diagnosis Date   Endometrial polyp    Dr. Elonda Husky   History of endometrial hyperplasia    HTN (hypertension)    Hx of adenomatous colonic polyps 1999   Hyperlipidemia    Mental disorder    GAD   Obesity    Osteoarthritis    Tachycardia    subsequently with bradycardia  (?secondary to meds)    Past Surgical History:  Procedure Laterality Date   BACK SURGERY     bilateral knee replacements  2002/2003   CHOLECYSTECTOMY     COLONOSCOPY  03/2002   Dr. Jamesetta Geralds internal hemorrhoids   COLONOSCOPY  06/21/2011   polyp at IC valve (tubular adenoma)   COLONOSCOPY N/A 07/15/2016   one 8 mm polyp at hepatic flexure (tubular adenoma), on 12 mm polyp at hepatic flexure (tubular adenoma), 3 year surveillance if health permits   COLONOSCOPY N/A 10/10/2019   descending colon diverticulosis, two 4-5 mm polyps at splenic flexure (tubular adenomas). No surveillance due to age.    LUMBAR LAMINECTOMY/DECOMPRESSION MICRODISCECTOMY N/A 04/27/2022   Procedure: Thoracic Eleven-Twelve Decompression;  Surgeon: Ashok Pall, MD;  Location: [place];  Service: Neurosurgery;  Laterality: N/A;   POLYPECTOMY  07/15/2016   Procedure: POLYPECTOMY;  Surgeon: Daneil Dolin, MD;  [facility];  Service: Endoscopy;;  Splenic Flexure polyps x 2 removed via hot snare   POLYPECTOMY  10/10/2019   Procedure: POLYPECTOMY;  Surgeon: Daneil Dolin, MD;  [facility];  Service: Endoscopy;;   TUBAL LIGATION       Home Medications:  Prior to Admission medications   Medication Sig Start Date End Date Taking? Authorizing Provider  acetaminophen (TYLENOL) 500 MG tablet Take 1,000 mg by mouth every 12 (twelve) hours.   Yes [provider]  albuterol (VENTOLIN HFA) 108 (90 Base) MCG/ACT inhaler Inhale 2 puffs into the lungs every 8 (eight) hours as needed for wheezing or shortness of breath.   Yes [provider]  Amino Acids-Protein Hydrolys (PRO-STAT AWC) LIQD Take 30 mLs by mouth 2 (two) times daily between meals.   Yes [provider]  aspirin EC 81 MG tablet Take 81 mg by mouth daily. Swallow whole.   Yes [provider]  furosemide (LASIX) 20 MG tablet Take 20 mg by mouth daily.   Yes [provider]  hydrochlorothiazide  (HYDRODIURIL) 25 MG tablet Take 1 tablet (25 mg total) by mouth daily. 04/30/22  Yes Thurnell Lose, MD  latanoprost (XALATAN) 0.005 % ophthalmic solution Place 1 drop into both eyes at bedtime.   Yes [provider]  lubiprostone (AMITIZA) 8 MCG capsule Take 1 capsule (8 mcg total) by mouth daily with breakfast. 12/13/19  Yes Annitta Needs, NP  medroxyPROGESTERone (PROVERA) 10 MG tablet TAKE 1 TABLET BY MOUTH DAILY 10 DAYS PER MONTH Patient taking differently: Take 10 mg by mouth See admin instructions. Take 10 mg once daily on the 1st-10th day of each month 01/13/22  Yes Eure, Mertie Clause, MD  Multiple Vitamins-Minerals (MULTIVITAMIN WITH MINERALS) tablet Take 1 tablet by mouth daily.   Yes [provider]  NIFEdipine (PROCARDIA XL/NIFEDICAL-XL) 90 MG 24 hr tablet Take 90 mg by mouth daily.   Yes [provider]  polyethylene glycol powder (GLYCOLAX/MIRALAX) 17 GM/SCOOP powder Take 17 g by mouth daily.   Yes [provider]  pravastatin (PRAVACHOL) 40 MG tablet Take 40 mg by mouth at bedtime.   Yes [provider]  sertraline (ZOLOFT) 100 MG tablet Take 100 mg by mouth daily.   Yes [provider]    Inpatient Medications: Scheduled Meds:  enoxaparin (LOVENOX) injection  40 mg Subcutaneous Q24H   lubiprostone  8 mcg Oral Q breakfast   polyethylene glycol  17 g Oral Daily   potassium chloride  40 mEq Oral BID   pravastatin  40 mg Oral QHS   sertraline  100 mg Oral Daily   Continuous Infusions:  PRN Meds: acetaminophen **OR** acetaminophen, hydrALAZINE  Allergies:    Allergies  Allergen Reactions   Ace Inhibitors Itching   Nsaids Itching    Social History:   Social History   Socioeconomic History   Marital status: Married    Spouse name: Not on file   Number of children: 4   Years of education: Not on file   Highest education level: Not on file  Occupational History    Employer: RETIRED  Tobacco Use   Smoking status: Never    Smokeless tobacco: Never  Vaping Use   Vaping Use: Never used  Substance and Sexual Activity   Alcohol use: No    Alcohol/week: 0.0 standard drinks of alcohol   Drug use: No   Sexual activity: Not Currently    Birth control/protection: Post-menopausal  Other Topics Concern   Not on file  Social History Narrative   Not on file   Social Determinants of Health   Financial Resource Strain: Not on file  Food Insecurity: Not on file  Transportation Needs: Not on file  Physical Activity: Not on file  Stress: Not on file  Social Connections: Not on file  Intimate Partner Violence: Not on file    Family History:    Family History  Problem Relation Age of Onset   Diabetes Brother  Colon polyps Brother    Diabetes Sister    Cancer Father        Likely prostate   Stroke Mother 54   Colon cancer Neg Hx      ROS:  Please see the history of present illness.  No melena, hematochezia, hematuria, fevers. She has a chronic cough with clear sputum. All other ROS reviewed and negative.     Physical Exam/Data:   Vitals:   12/26/22 0315 12/26/22 0505 12/26/22 0512 12/26/22 0700  BP:  (!) 195/62  (!) 206/85  Pulse: (!) 45 (!) 50  (!) 44  Resp: '16 19  16  '$ Temp:   98.3 F (36.8 C)   TempSrc:   Oral   SpO2: 96% 100%  97%    Intake/Output Summary (Last 24 hours) at 12/26/2022 1110 Last data filed at 12/26/2022 0522 Gross per 24 hour  Intake 100 ml  Output 500 ml  Net -400 ml      12/23/2022    2:12 PM 04/23/2022   11:45 AM 03/08/2022    3:00 PM  Last 3 Weights  Weight (lbs) 242 lb 245 lb 241 lb  Weight (kg) 109.77 kg 111.131 kg 109.317 kg     There is no height or weight on file to calculate BMI.  General:  Well nourished, well developed, in no acute distress  HEENT: normal Neck: no JVD Vascular: No carotid bruits  Endo: no thyromegaly Cardiac:  normal S1, S2; RRR; no murmur   Lungs:  clear to auscultation bilaterally  Abd: soft, nontender, no hepatomegaly  Ext: 1+  bilat LE edema Musculoskeletal:  No deformities  Skin: warm and dry  Neuro:  CNs 2-12 intact, no focal abnormalities noted Psych:  Normal affect   EKG:  The EKG was personally reviewed and demonstrates:  see HPI Telemetry:  Telemetry was personally reviewed and demonstrates:  sinus brady; one event appears to be junctional rhythm with normal rate  Relevant CV Studies: See HPI  Laboratory Data:  High Sensitivity Troponin:   Recent Labs  Lab 12/23/22 1414 12/25/22 1222  TROPONINIHS 10 15     Chemistry Recent Labs  Lab 12/23/22 1414 12/23/22 2340 12/25/22 1222 12/26/22 0328  NA 139 137 139 136  K 3.8 3.6 3.8 3.3*  CL 103 103 103 102  CO2 '29 25 29 24  '$ GLUCOSE 100* 113* 104* 94  BUN '20 17 18 13  '$ CREATININE 0.65 0.59 0.56 0.59  CALCIUM 9.1 9.1 9.1 9.1  MG 2.3  --   --  2.0  GFRNONAA >60 >60 >60 >60  ANIONGAP '7 9 7 10    '$ No results for input(s): "PROT", "ALBUMIN", "AST", "ALT", "ALKPHOS", "BILITOT" in the last 168 hours. Lipids No results for input(s): "CHOL", "TRIG", "HDL", "LABVLDL", "LDLCALC", "CHOLHDL" in the last 168 hours.  Hematology Recent Labs  Lab 12/23/22 2340 12/25/22 1222 12/26/22 0328  WBC 7.8 5.9 7.3  RBC 4.37 4.13 4.24  HGB 12.2 11.7* 11.7*  HCT 38.0 35.0* 36.6  MCV 87.0 84.7 86.3  MCH 27.9 28.3 27.6  MCHC 32.1 33.4 32.0  RDW 16.2* 16.1* 16.4*  PLT 180 200 209   Thyroid  Recent Labs  Lab 12/23/22 1414  TSH 2.994    BNPNo results for input(s): "BNP", "PROBNP" in the last 168 hours.  DDimer No results for input(s): "DDIMER" in the last 168 hours.   Radiology/Studies:  CT ANGIO HEAD NECK W WO CM  Result Date: 12/25/2022 CLINICAL DATA:  Transient ischemic attack (TIA)  EXAM: CT ANGIOGRAPHY HEAD AND NECK TECHNIQUE: Multidetector CT imaging of the head and neck was performed using the standard protocol during bolus administration of intravenous contrast. Multiplanar CT image reconstructions and MIPs were obtained to evaluate the vascular  anatomy. Carotid stenosis measurements (when applicable) are obtained utilizing NASCET criteria, using the distal internal carotid diameter as the denominator. RADIATION DOSE REDUCTION: This exam was performed according to the departmental dose-optimization program which includes automated exposure control, adjustment of the mA and/or kV according to patient size and/or use of iterative reconstruction technique. CONTRAST:  57m OMNIPAQUE IOHEXOL 350 MG/ML SOLN COMPARISON:  CT head 12/23/2022. FINDINGS: CT HEAD FINDINGS Brain: No evidence of acute large vascular territory infarction, hemorrhage, hydrocephalus, extra-axial collection or mass lesion/mass effect. Enlarged and empty sella. Patchy white matter hypodensities, compatible with chronic microvascular ischemic disease. Vascular: See below. Skull: No acute fracture. Sinuses/Orbits: Remote right orbital floor blowout fracture. Clear sinuses. Review of the MIP images confirms the above findings CTA NECK FINDINGS Aortic arch: Great vessel origins are patent. Right carotid system: Retropharyngeal course. Patent without greater than 50% stenosis. Left carotid system: Retropharyngeal course. Calcific atherosclerosis at the carotid bifurcation with approximately 40% stenosis of the origin. Vertebral arteries: Bilateral vertebral arteries are patent without visible greater than 50% stenosis in the neck. Limited visualization the right vertebral artery origin due to streak artifact. Skeleton: Severe multilevel degenerative change negative Other neck: No acute findings on limited assessment. Upper chest: Mild scarring in the visualized lung apices without confluent consolidation. Review of the MIP images confirms the above findings CTA HEAD FINDINGS Anterior circulation: Mild for age bearing of the intracranial ICAs due to calcific atherosclerosis. Bilateral MCAs and ACAs are patent without proximal hemodynamically significant stenosis. Posterior circulation: Moderate  stenosis of bilateral intradural vertebral arteries. The basilar artery and bilateral posterior cerebral arteries are patent without hemodynamically significant stenosis. Venous sinuses: As permitted by contrast timing, patent. Review of the MIP images confirms the above findings IMPRESSION: 1. No large vessel occlusion. 2. Moderate bilateral intradural vertebral artery stenosis. 3. 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 Electronically Signed   By: FMargaretha SheffieldM.D.   On: 12/25/2022 16:55   ECHOCARDIOGRAM COMPLETE  Result Date: 12/24/2022    ECHOCARDIOGRAM REPORT   Patient Name:   ABERNESTINE HOLSAPPLEDate of Exam: 12/24/2022 Medical Rec #:  [id number]   Height:       62.0 in Accession #:    [id number]  Weight:       242.0 lb Date of Birth:  [birth year]   BSA:          2.073 m Patient Age:    81years     BP:           136/86 mmHg Patient Gender: F            HR:           41 bpm. Exam Location:  Inpatient Procedure: 2D Echo Indications:    symptomatic bradycardia  History:        Patient has prior history of Echocardiogram examinations, most                 recent 03/08/2022. Risk Factors:Sleep Apnea, Hypertension and                 Dyslipidemia.  Sonographer:    LJohny ChessRDCS Referring Phys: [id number] Sylvan Grove HospitalNARENDRA  Sonographer Comments: Patient is obese. Image acquisition challenging due  to patient body habitus. IMPRESSIONS  1. Left ventricular ejection fraction, by estimation, is 60 to 65%. The left ventricle has normal function. The left ventricle has no regional wall motion abnormalities. There is mild left ventricular hypertrophy. Left ventricular diastolic parameters are consistent with Grade I diastolic dysfunction (impaired relaxation).  2. Right ventricular systolic function is normal. The right ventricular size is normal. There is normal pulmonary artery systolic pressure. The estimated right ventricular systolic pressure is 73.7 mmHg.  3. The mitral valve is normal in  structure. Trivial mitral valve regurgitation. No evidence of mitral stenosis.  4. The aortic valve is tricuspid. Aortic valve regurgitation is not visualized. Aortic valve sclerosis/calcification is present, without any evidence of aortic stenosis.  5. The inferior vena cava is normal in size with greater than 50% respiratory variability, suggesting right atrial pressure of 3 mmHg. FINDINGS  Left Ventricle: Left ventricular ejection fraction, by estimation, is 60 to 65%. The left ventricle has normal function. The left ventricle has no regional wall motion abnormalities. The left ventricular internal cavity size was normal in size. There is  mild left ventricular hypertrophy. Left ventricular diastolic parameters are consistent with Grade I diastolic dysfunction (impaired relaxation). Right Ventricle: The right ventricular size is normal. No increase in right ventricular wall thickness. Right ventricular systolic function is normal. There is normal pulmonary artery systolic pressure. The tricuspid regurgitant velocity is 2.52 m/s, and  with an assumed right atrial pressure of 3 mmHg, the estimated right ventricular systolic pressure is 10.6 mmHg. Left Atrium: Left atrial size was normal in size. Right Atrium: Right atrial size was normal in size. Pericardium: There is no evidence of pericardial effusion. Mitral Valve: The mitral valve is normal in structure. Trivial mitral valve regurgitation. No evidence of mitral valve stenosis. Tricuspid Valve: The tricuspid valve is normal in structure. Tricuspid valve regurgitation is trivial. Aortic Valve: The aortic valve is tricuspid. Aortic valve regurgitation is not visualized. Aortic valve sclerosis/calcification is present, without any evidence of aortic stenosis. Pulmonic Valve: The pulmonic valve was not well visualized. Pulmonic valve regurgitation is not visualized. Aorta: The aortic root is normal in size and structure. Venous: The inferior vena cava is normal in  size with greater than 50% respiratory variability, suggesting right atrial pressure of 3 mmHg. IAS/Shunts: The interatrial septum was not well visualized.  LEFT VENTRICLE PLAX 2D LVIDd:         4.10 cm   Diastology LVIDs:         2.50 cm   LV e' medial:    6.42 cm/s LV PW:         1.30 cm   LV E/e' medial:  10.2 LV IVS:        1.00 cm   LV e' lateral:   8.49 cm/s LVOT diam:     1.90 cm   LV E/e' lateral: 7.7 LV SV:         72 LV SV Index:   35 LVOT Area:     2.84 cm  RIGHT VENTRICLE             IVC RV S prime:     17.60 cm/s  IVC diam: 1.40 cm TAPSE (M-mode): 2.5 cm LEFT ATRIUM           Index LA diam:      3.10 cm 1.50 cm/m LA Vol (A4C): 61.2 ml 29.52 ml/m  AORTIC VALVE LVOT Vmax:   104.00 cm/s LVOT Vmean:  65.500 cm/s LVOT VTI:  0.253 m  AORTA Ao Root diam: 2.80 cm MITRAL VALVE               TRICUSPID VALVE MV Area (PHT): 2.52 cm    TR Peak grad:   25.4 mmHg MV Decel Time: 301 msec    TR Vmax:        252.00 cm/s MV E velocity: 65.20 cm/s MV A velocity: 87.20 cm/s  SHUNTS MV E/A ratio:  0.75        Systemic VTI:  0.25 m                            Systemic Diam: 1.90 cm Oswaldo Milian MD Electronically signed by Oswaldo Milian MD Signature Date/Time: 12/24/2022/11:44:43 AM    Final    CT Head Wo Contrast  Result Date: 12/23/2022 CLINICAL DATA:  Vertigo. Hypertension. Fatigue and dizziness. Dizziness. EXAM: CT HEAD WITHOUT CONTRAST TECHNIQUE: Contiguous axial images were obtained from the base of the skull through the vertex without intravenous contrast. RADIATION DOSE REDUCTION: This exam was performed according to the departmental dose-optimization program which includes automated exposure control, adjustment of the mA and/or kV according to patient size and/or use of iterative reconstruction technique. COMPARISON:  CT brain 07/04/2021 and 04/23/2022, CT facial bones 07/04/2021 FINDINGS: Brain: There is moderate cortical atrophy, not significantly changed from prior and within normal limits  for patient age. The ventricles are normal in configuration. The basilar cisterns are patent. No mass, mass effect, or midline shift. No acute intracranial hemorrhage is seen. No abnormal extra-axial fluid collection. 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 Moderate patchy periventricular white matter hypodensities, similar to prior and likely chronic ischemic white matter changes. Otherwise, preservation of the normal cortical gray-white interface without CT evidence of an acute major vascular territorial cortical based infarction. Vascular: No hyperdense vessel or unexpected calcification. Atherosclerotic calcifications are seen within the skull base arteries. Skull: Normal. Negative for fracture or focal lesion. Sinuses/Orbits: The visualized orbits are unremarkable. There is again remote right orbital floor fracture with the right inferior rectus muscle mildly inferiorly displaced, and approximately 9 mm craniocaudal dimension periorbital fat herniating through the open right orbital floor fracture defect, unchanged from prior. Other: None. IMPRESSION: 1. No acute intracranial findings. 2. Moderate cortical atrophy and chronic ischemic white matter changes, similar to prior. 3. Redemonstration of remote right orbital floor fracture with downward herniation of intraorbital fat. Electronically Signed   By: Yvonne Kendall M.D.   On: 12/23/2022 15:18   DG Chest Port 1 View  Result Date: 12/23/2022 CLINICAL DATA:  Hypertension, dizziness EXAM: PORTABLE CHEST 1 VIEW COMPARISON:  None Available. FINDINGS: Cardiomegaly. Both lungs are clear. The visualized skeletal structures are unremarkable. IMPRESSION: Cardiomegaly without acute abnormality of the lungs in AP portable projection. Electronically Signed   By: Delanna Ahmadi M.D.   On: 12/23/2022 14:41     Assessment and Plan:   1. Sinus bradycardia 1st degree AV block noted. No high grade HB noted. No pauses or high grade HB on tele. There is one  event noted that looks like it may be junctional. Otherwise, I do not see anything that would commit her to a pacemaker. She has not had syncope. She is non-ambulatory. So, we cannot walk her to see if she has chronotropic incompetence. If no significant arrhythmias here, consider OP 30 day event monitor. Agree with holding Clonidine and Nifedipine to ensure they are not causing any bradycardia.  MD to see.  2. Hypertension Per primary service. BP elevated but it looks like meds are on hold to allow for higher BP in setting of VA stenosis.    Risk Assessment/Risk Scores:         For questions or updates, please contact [facility] Please consult [email or web address] for contact info under    Signed, Richardson Dopp, PA-C  12/26/2022 11:10 AM   EP Attending  Patient seen and examined. Agree with above. The patient presents with uncontrolled HTN after clonidine and procardia were stopped. She has sinus node dysfunction. I have her do hand grip exercises at the bedside and her HR increased from 44 to 52/min. Her exam is notable for a SBP of 172. She has minimal peripheral edema. Her lungs are clear and CV reveals a reg brady. I would recommend adding amlodipine which will not slow her heart and I think she would be ok to discharge home with early followup. As she is not particularly ambulatroy, and spends most of the day in a chair or on the sofa, due to back problems, no indication for PPM insertion.   Carleene Overlie Deshanae Lindo,MD

## 2022-12-27 DIAGNOSIS — I10 Essential (primary) hypertension: Secondary | ICD-10-CM | POA: Diagnosis not present

## 2022-12-27 DIAGNOSIS — R42 Dizziness and giddiness: Secondary | ICD-10-CM | POA: Diagnosis not present

## 2022-12-27 DIAGNOSIS — R001 Bradycardia, unspecified: Secondary | ICD-10-CM | POA: Diagnosis not present

## 2022-12-27 LAB — CBC
HCT: 33.4 % — ABNORMAL LOW (ref 36.0–46.0)
Hemoglobin: 11.2 g/dL — ABNORMAL LOW (ref 12.0–15.0)
MCH: 28.2 pg (ref 26.0–34.0)
MCHC: 33.5 g/dL (ref 30.0–36.0)
MCV: 84.1 fL (ref 80.0–100.0)
Platelets: 187 10*3/uL (ref 150–400)
RBC: 3.97 MIL/uL (ref 3.87–5.11)
RDW: 16.2 % — ABNORMAL HIGH (ref 11.5–15.5)
WBC: 6.4 10*3/uL (ref 4.0–10.5)
nRBC: 0 % (ref 0.0–0.2)

## 2022-12-27 LAB — BASIC METABOLIC PANEL
Anion gap: 5 (ref 5–15)
BUN: 19 mg/dL (ref 8–23)
CO2: 26 mmol/L (ref 22–32)
Calcium: 8.8 mg/dL — ABNORMAL LOW (ref 8.9–10.3)
Chloride: 105 mmol/L (ref 98–111)
Creatinine, Ser: 0.68 mg/dL (ref 0.44–1.00)
GFR, Estimated: >60 mL/min (ref >60)
Glucose, Bld: 100 mg/dL — ABNORMAL HIGH (ref 70–99)
Potassium: 4.3 mmol/L (ref 3.5–5.1)
Sodium: 136 mmol/L (ref 135–145)

## 2022-12-27 MED ORDER — LOSARTAN POTASSIUM 25 MG PO TABS
25.0000 mg | ORAL_TABLET | Freq: Every day | ORAL | Status: DC
  Administered 2022-12-27: 25 mg via ORAL
  Filled 2022-12-27: qty 1

## 2022-12-27 MED ORDER — LOSARTAN POTASSIUM 25 MG PO TABS: 25.0000 mg | ORAL_TABLET | Freq: Every day | ORAL | Status: DC

## 2022-12-27 MED ORDER — AMLODIPINE BESYLATE 5 MG PO TABS: 5.0000 mg | ORAL_TABLET | Freq: Every day | ORAL | Status: DC

## 2022-12-27 NOTE — Progress Notes (Signed)
Patient report given to nurse Phineas Real at [facility]. Marcille Blanco, RN

## 2022-12-27 NOTE — NC FL2 (Signed)
[place] LEVEL OF CARE FORM     IDENTIFICATION  Patient Name: Shelley Mitchell Birthdate: [birth year] Sex: female Admission Date (Current Location): 12/25/2022  [facility] and [place] Number:  Herbalist and Address:  [facility] [place], [place] [address], [place], [place] [address]      Provider Number: [id number]  Attending Physician Name and Address:  Aldine Contes, MD  Relative Name and Phone Number:       Current Level of Care: Hospital Recommended Level of Care: [place] Prior Approval Number:    Date Approved/Denied:   PASRR Number: [id number]  Discharge Plan: Other (Comment) (ALF)    Current Diagnoses: Patient Active Problem List   Diagnosis Date Noted   Symptomatic bradycardia 12/23/2022   Thoracic spinal stenosis 04/27/2022   Leg weakness 04/23/2022   Essential hypertension 04/23/2022   Hyperlipidemia 04/23/2022   Anxiety 04/23/2022   Spinal stenosis 04/23/2022   Osteoarthritis    OSA (obstructive sleep apnea) 04/22/2021   Constipation 08/15/2019   Screening for colorectal cancer 01/29/2019   PMB (postmenopausal bleeding) 12/20/2017   History of endometrial hyperplasia 12/20/2017   Encounter for well woman exam with routine gynecological exam 12/20/2017   Hx of adenomatous colonic polyps 05/31/2011    Orientation RESPIRATION BLADDER Height & Weight     Self, Time, Situation, Place  Normal Continent, External catheter Weight: 249 lb 9 oz (113.2 kg) Height:  '5\' 2"'$  (157.5 cm)  BEHAVIORAL SYMPTOMS/MOOD NEUROLOGICAL BOWEL NUTRITION STATUS      Continent Diet (See dc summary)  AMBULATORY STATUS COMMUNICATION OF NEEDS Skin   Extensive Assist Verbally Normal                       Personal Care Assistance Level of Assistance  Bathing, Feeding, Dressing Bathing Assistance: Maximum assistance Feeding assistance: Limited assistance Dressing Assistance: Maximum assistance     Functional  Limitations Info  Sight, Hearing, Speech Sight Info: Adequate Hearing Info: Impaired Speech Info: Adequate    SPECIAL CARE FACTORS FREQUENCY                       Contractures Contractures Info: Not present    Additional Factors Info  Code Status, Allergies Code Status Info: DNR Allergies Info: Ace Inhibitors  Nsaids           Current Medications (12/27/2022):  This is the current hospital active medication list Current Facility-Administered Medications  Medication Dose Route Frequency Provider Last Rate Last Admin   acetaminophen (TYLENOL) tablet 650 mg  650 mg Oral Q6H PRN Rick Duff, MD   650 mg at 12/27/22 9518   Or   acetaminophen (TYLENOL) suppository 650 mg  650 mg Rectal Q6H PRN Rick Duff, MD       amLODipine (NORVASC) tablet 5 mg  5 mg Oral Daily Evans [name], MD   5 mg at 12/27/22 0930   enoxaparin (LOVENOX) injection 40 mg  40 mg Subcutaneous Q24H Rick Duff, MD   40 mg at 12/27/22 0931   hydrALAZINE (APRESOLINE) tablet 25 mg  25 mg Oral Q6H PRN Serita Butcher, MD       losartan (COZAAR) tablet 25 mg  25 mg Oral Daily Nani Gasser, MD   25 mg at 12/27/22 1251   lubiprostone (AMITIZA) capsule 8 mcg  8 mcg Oral Q breakfast Serita Butcher, MD   8 mcg at 12/27/22 0930   polyethylene glycol (MIRALAX / GLYCOLAX)  packet 17 g  17 g Oral Daily Serita Butcher, MD   17 g at 12/27/22 0930   pravastatin (PRAVACHOL) tablet 40 mg  40 mg Oral QHS Serita Butcher, MD   40 mg at 12/26/22 2119   sertraline (ZOLOFT) tablet 100 mg  100 mg Oral Daily Serita Butcher, MD   100 mg at 12/27/22 0930     Discharge Medications: Please see discharge summary for a list of discharge medications.  Relevant Imaging Results:  Relevant Lab Results:   Additional Information SSN: [id number]  Beckey Rutter, MSW, [place] Transitions of Care  Clinical Social Worker I

## 2022-12-27 NOTE — Discharge Instructions (Addendum)
Ms. Shelley Mitchell  You were treated in the hospital for high blood pressure, which I think is the cause of the visual disturbances and other symptoms you experienced. We are discharging you now that you are doing better. To help assist you on your road to recovery, I have written the following recommendations:   For your blood pressure, discontinue: - Hydrochlorothiazide - Furosemide - Nifedipine - Clonidine  Start taking: - Amlodipine 5 mg daily - Losartan 25 mg daily  It was a privilege to be a part of your hospital care team, and I hope you feel better as a result of your stay.  All the best, Nani Gasser, MD

## 2022-12-27 NOTE — TOC Transition Note (Signed)
Transition of Care [facility]) - CM/SW Discharge Note   Patient Details  Name: Shelley Mitchell MRN: [id number] Date of Birth: 1941-11-02  Transition of Care [facility]) CM/SW Contact:  Bjorn Pippin, LCSW Phone Number: 12/27/2022, 2:19 PM   Clinical Narrative:    Patient will DC to: [facility]  Anticipated DC date: 12/27/2022 Family notified: Son  Transport by: Corey Harold    Per MD patient ready for DC to . RN, patient, patient's family, and facility notified of DC. Discharge Summary and FL2 sent to facility. RN to call report prior to discharge [phone]. DC packet on chart. Ambulance transport requested for patient.   CSW will sign off for now as social work intervention is no longer needed. Please consult [place] again if new needs arise.    Final next level of care: Assisted Living Barriers to Discharge: No Barriers Identified   Patient Goals and CMS Choice      Discharge Placement                Patient chooses bed at: [facility] Patient to be transferred to facility by: [place] Name of family member notified: [name] (son) Patient and family notified of of transfer: 12/27/22  Discharge Plan and Services Additional resources added to the After Visit Summary for                                       Social Determinants of Health (SDOH) Interventions SDOH Screenings   Food Insecurity: No Food Insecurity (12/26/2022)  Housing: Low Risk  (12/27/2022)  Transportation Needs: No Transportation Needs (12/26/2022)  Utilities: Not At Risk (12/26/2022)  Depression (PHQ2-9): Medium Risk (01/29/2019)  Tobacco Use: Low Risk  (12/25/2022)     Readmission Risk Interventions     No data to display           Beckey Rutter, MSW, LCSWA, LCASA Transitions of Care  Clinical Social Worker I

## 2022-12-27 NOTE — Progress Notes (Addendum)
Rounding Note    Patient Name: Shelley Mitchell Date of Encounter: 12/27/2022  [place] Cardiologist: Kate Sable, MD   Subjective   No chest pain no SOB  Inpatient Medications    Scheduled Meds:  amLODipine  5 mg Oral Daily   enoxaparin (LOVENOX) injection  40 mg Subcutaneous Q24H   lubiprostone  8 mcg Oral Q breakfast   polyethylene glycol  17 g Oral Daily   pravastatin  40 mg Oral QHS   sertraline  100 mg Oral Daily   Continuous Infusions:  PRN Meds: acetaminophen **OR** acetaminophen, hydrALAZINE   Vital Signs    Vitals:   12/26/22 2338 12/27/22 0448 12/27/22 0532 12/27/22 0725  BP: (!) 165/69 (!) 187/79 (!) 157/66 (!) 154/67  Pulse: (!) 48 67 68 (!) 54  Resp: (!) '21 17 18 20  '$ Temp: 98 F (36.7 C) 98.4 F (36.9 C)  98.5 F (36.9 C)  TempSrc:  Oral  Oral  SpO2: 99% 100% 95% 97%  Weight:  113.2 kg    Height:        Intake/Output Summary (Last 24 hours) at 12/27/2022 0841 Last data filed at 12/27/2022 3614 Gross per 24 hour  Intake 860 ml  Output 400 ml  Net 460 ml      12/27/2022    4:48 AM 12/26/2022    5:00 PM 12/23/2022    2:12 PM  Last 3 Weights  Weight (lbs) 249 lb 9 oz 247 lb 9.2 oz 242 lb  Weight (kg) 113.2 kg 112.3 kg 109.77 kg      Telemetry    SB to 39 at night, she does snore per pt - Personally Reviewed  ECG    No new - Personally Reviewed  Physical Exam   GEN: No acute distress.   Neck: No JVD Cardiac: RRR, no murmurs, rubs, or gallops.  Respiratory: Clear to auscultation bilaterally. GI: Soft, nontender, non-distended  MS: No edema; No deformity. Neuro:  Nonfocal  Psych: Normal affect   Labs    High Sensitivity Troponin:   Recent Labs  Lab 12/23/22 1414 12/25/22 1222  TROPONINIHS 10 15     Chemistry Recent Labs  Lab 12/23/22 1414 12/23/22 2340 12/25/22 1222 12/26/22 0328 12/27/22 0032  NA 139   < > 139 136 136  K 3.8   < > 3.8 3.3* 4.3  CL 103   < > 103 102 105  CO2 29   < > '29 24 26   '$ GLUCOSE 100*   < > 104* 94 100*  BUN 20   < > '18 13 19  '$ CREATININE 0.65   < > 0.56 0.59 0.68  CALCIUM 9.1   < > 9.1 9.1 8.8*  MG 2.3  --   --  2.0  --   GFRNONAA >60   < > >60 >60 >60  ANIONGAP 7   < > '7 10 5   '$ < > = values in this interval not displayed.    Lipids No results for input(s): "CHOL", "TRIG", "HDL", "LABVLDL", "LDLCALC", "CHOLHDL" in the last 168 hours.  Hematology Recent Labs  Lab 12/25/22 1222 12/26/22 0328 12/27/22 0032  WBC 5.9 7.3 6.4  RBC 4.13 4.24 3.97  HGB 11.7* 11.7* 11.2*  HCT 35.0* 36.6 33.4*  MCV 84.7 86.3 84.1  MCH 28.3 27.6 28.2  MCHC 33.4 32.0 33.5  RDW 16.1* 16.4* 16.2*  PLT 200 209 187   Thyroid  Recent Labs  Lab 12/23/22 1414  TSH 2.994  BNPNo results for input(s): "BNP", "PROBNP" in the last 168 hours.  DDimer No results for input(s): "DDIMER" in the last 168 hours.   Radiology    CT ANGIO HEAD NECK W WO CM  Result Date: 12/25/2022 CLINICAL DATA:  Transient ischemic attack (TIA) EXAM: CT ANGIOGRAPHY HEAD AND NECK TECHNIQUE: Multidetector CT imaging of the head and neck was performed using the standard protocol during bolus administration of intravenous contrast. Multiplanar CT image reconstructions and MIPs were obtained to evaluate the vascular anatomy. Carotid stenosis measurements (when applicable) are obtained utilizing NASCET criteria, using the distal internal carotid diameter as the denominator. RADIATION DOSE REDUCTION: This exam was performed according to the departmental dose-optimization program which includes automated exposure control, adjustment of the mA and/or kV according to patient size and/or use of iterative reconstruction technique. CONTRAST:  34m OMNIPAQUE IOHEXOL 350 MG/ML SOLN COMPARISON:  CT head 12/23/2022. FINDINGS: CT HEAD FINDINGS Brain: No evidence of acute large vascular territory infarction, hemorrhage, hydrocephalus, extra-axial collection or mass lesion/mass effect. Enlarged and empty sella. Patchy white  matter hypodensities, compatible with chronic microvascular ischemic disease. Vascular: See below. Skull: No acute fracture. Sinuses/Orbits: Remote right orbital floor blowout fracture. Clear sinuses. Review of the MIP images confirms the above findings CTA NECK FINDINGS Aortic arch: Great vessel origins are patent. Right carotid system: Retropharyngeal course. Patent without greater than 50% stenosis. Left carotid system: Retropharyngeal course. Calcific atherosclerosis at the carotid bifurcation with approximately 40% stenosis of the origin. Vertebral arteries: Bilateral vertebral arteries are patent without visible greater than 50% stenosis in the neck. Limited visualization the right vertebral artery origin due to streak artifact. Skeleton: Severe multilevel degenerative change negative Other neck: No acute findings on limited assessment. Upper chest: Mild scarring in the visualized lung apices without confluent consolidation. Review of the MIP images confirms the above findings CTA HEAD FINDINGS Anterior circulation: Mild for age bearing of the intracranial ICAs due to calcific atherosclerosis. Bilateral MCAs and ACAs are patent without proximal hemodynamically significant stenosis. Posterior circulation: Moderate stenosis of bilateral intradural vertebral arteries. The basilar artery and bilateral posterior cerebral arteries are patent without hemodynamically significant stenosis. Venous sinuses: As permitted by contrast timing, patent. Review of the MIP images confirms the above findings IMPRESSION: 1. No large vessel occlusion. 2. Moderate bilateral intradural vertebral artery stenosis. 3. 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 Electronically Signed   By: FMargaretha SheffieldM.D.   On: 12/25/2022 16:55    Cardiac Studies   TTE 12/24/22: normal ejection fraction at 60-65, mild LVH, no WMA, NL PASP, AV sclerosis, no AS, RAP 3   Patient Profile     82y.o. female hx of hypertension,  hyperlipidemia with recent admit for dizziness and bradycardia.  Her clonidine was stopped and HR improved.  Readmitted 12/25/22 with dizziness and BP 2384systolic, in ER 1665/99  Her HR was 48.   Assessment & Plan    Dizziness and sinus brady --+ sinus node dysfunction  HR at night to 39 pt asymptomatic she does snore per pt.  ? Sleep study --seen by Dr. TLovena Lewith EP and no indication for PPM currently.  With had grip exercises HR up from 44 to 52   Hypertension --uncontrolled after stopping amlodipine and clonidine.  Ok per Dr. TLovena Leto resume amlodipine, will not slow HR.   BP now 154/67 continue to monitor  For questions or updates, please contact CCrawfordPlease consult [email or web address] for contact info under  Signed, Cecilie Kicks, NP  12/27/2022, 8:41 AM   As above, patient seen and examined.  She denies chest pain, dyspnea or dizziness.  Telemetry personally reviewed.  Heart rate in the 30s while asleep but otherwise predominantly in the high 40s low 50s.  No indication for pacemaker.  Avoid AV nodal blocking agents.  Continue present medications at discharge.  Adjust antihypertensives as an outpatient.  Will arrange follow-up in the office.  Cardiology will sign off. Kirk Ruths, MD

## 2022-12-27 NOTE — Discharge Summary (Addendum)
Name: Shelley Mitchell MRN: [id number] DOB: January 23, 1941 82 y.o. PCP: [name], MD  Date of Admission: 12/25/2022 12:14 PM Date of Discharge: 12/27/2022 12:07 PM Attending Physician: Aldine Contes, MD  Discharge Diagnosis: Active Problems:   * No active hospital problems. *   Discharge Medications: Allergies as of 12/27/2022       Reactions   Ace Inhibitors Itching   Nsaids Itching        Medication List     STOP taking these medications    furosemide 20 MG tablet Commonly known as: LASIX   hydrochlorothiazide 25 MG tablet Commonly known as: HYDRODIURIL   NIFEdipine 90 MG 24 hr tablet Commonly known as: PROCARDIA XL/NIFEDICAL-XL       TAKE these medications    acetaminophen 500 MG tablet Commonly known as: TYLENOL Take 1,000 mg by mouth every 12 (twelve) hours.   albuterol 108 (90 Base) MCG/ACT inhaler Commonly known as: VENTOLIN HFA Inhale 2 puffs into the lungs every 8 (eight) hours as needed for wheezing or shortness of breath.   amLODipine 5 MG tablet Commonly known as: NORVASC Take 1 tablet (5 mg total) by mouth daily. Start taking on: December 28, 2022   aspirin EC 81 MG tablet Take 81 mg by mouth daily. Swallow whole.   latanoprost 0.005 % ophthalmic solution Commonly known as: XALATAN Place 1 drop into both eyes at bedtime.   losartan 25 MG tablet Commonly known as: COZAAR Take 1 tablet (25 mg total) by mouth daily.   lubiprostone 8 MCG capsule Commonly known as: Amitiza Take 1 capsule (8 mcg total) by mouth daily with breakfast.   medroxyPROGESTERone 10 MG tablet Commonly known as: PROVERA TAKE 1 TABLET BY MOUTH DAILY 10 DAYS PER MONTH What changed: See the new instructions.   multivitamin with minerals tablet Take 1 tablet by mouth daily.   polyethylene glycol powder 17 GM/SCOOP powder Commonly known as: GLYCOLAX/MIRALAX Take 17 g by mouth daily.   pravastatin 40 MG tablet Commonly known as: PRAVACHOL Take 40 mg by mouth  at bedtime.   Pro-Stat AWC Liqd Take 30 mLs by mouth 2 (two) times daily between meals.   sertraline 100 MG tablet Commonly known as: ZOLOFT Take 100 mg by mouth daily.        Follow-up Appointments:  Follow-up Information     Furth, Cadence H, PA-C Follow up on 01/10/2023.   Specialty: Cardiology Why: at 3:10 pm for cardiology Contact information: [place] [address] [phone]                 Disposition and follow-up:   Ms. Shelley Mitchell is a 82 y.o. year old admitted for visual disturbances and nausea in the setting of severe hypertension.  Hypertension Discontinuing clonidine because of bradycardia.  Discontinued nifedipine, furosemide, and HCTZ because of potential orthostatic hypotension.  Starting: - Amlodipine 5 mg daily - Losartan 25 mg daily Patient will need repeat BMP in 2-4 weeks to assess creatinine and potassium after starting losartan  Bradycardia Heart rate was stable in the 40s to 50s.  Increased with handgrip exercises.  My impression is that her hypertension is causing her symptoms rather than her bradycardia. - Avoid AV nodal agents  Hospital Course by problem list:  Dizziness Initially admitted for dizziness in the setting of high blood pressure.  It was transient and associated with pain and headache.  At her SNF, her blood pressure registered around 935 systolic.  She was  discharged from the hospital a couple of days before this event for similar symptoms, thought to be caused by bradycardia.  However, her heart rate remained stable in the 50s.  She had appropriate increase with handgrip exercises.  A CTA of her head and neck showed some mild left ICA stenosis and moderate bilateral vertebral artery stenosis.  Her case was discussed with vascular surgery who was of the opinion that the small degree of stenosis did not explain her symptomatology.  Cardiology was consulted for evaluation, and their impression was  for symptomatic hypertension rather than symptomatic bradycardia.  Her blood pressure was brought under control with a regimen of amlodipine 5 mg and losartan 25 mg.  She was discharged on this regimen and instructed to discontinue taking nifedipine, HCTZ, and furosemide to avoid applications of orthostatic hypotension given her low diastolic blood pressure.  She was also instructed to avoid clonidine because it can cause bradycardia.  Discharge Exam:  Feels ok now. When she had her episode of dizziness, she felt like she had blurry visio and nausea. Her visio has been getting worse recently either way.  Denies n/v, dyspnea, chest pain.    Blood pressure (!) 158/76, pulse (!) 54, temperature 98.3 F (36.8 C), temperature source Oral, resp. rate 19, height '5\' 2"'$  (1.575 m), weight 113.2 kg, SpO2 98 %.  Comfortable appearing female resting in bed Heart rate is bradycardic and regular, radial pulse 2+ Breathing is regular nonlabored Skin is warm and dry No gross neurologic deficits Pleasant, mood and affect are concordant  Pertinent studies and procedures:   Latest Reference Range & Units 12/27/22 00:32  Sodium 135 - 145 mmol/L 136  Potassium 3.5 - 5.1 mmol/L 4.3  Chloride 98 - 111 mmol/L 105  CO2 22 - 32 mmol/L 26  Glucose 70 - 99 mg/dL 100 (H)  BUN 8 - 23 mg/dL 19  Creatinine 0.44 - 1.00 mg/dL 0.68  Calcium 8.9 - 10.3 mg/dL 8.8 (L)  Anion gap 5 - 15  5  GFR, Estimated >60 mL/min >60  (H): Data is abnormally high (L): Data is abnormally low   Latest Reference Range & Units 12/27/22 00:32  WBC 4.0 - 10.5 K/uL 6.4  RBC 3.87 - 5.11 MIL/uL 3.97  Hemoglobin 12.0 - 15.0 g/dL 11.2 (L)  HCT 36.0 - 46.0 % 33.4 (L)  MCV 80.0 - 100.0 fL 84.1  MCH 26.0 - 34.0 pg 28.2  MCHC 30.0 - 36.0 g/dL 33.5  RDW 11.5 - 15.5 % 16.2 (H)  Platelets 150 - 400 K/uL 187  nRBC 0.0 - 0.2 % 0.0  (L): Data is abnormally low (H): Data is abnormally high  CT angio head and neck IMPRESSION: 1. No large vessel  occlusion. 2. Moderate bilateral intradural vertebral artery stenosis. 3. 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  Discharge Instructions:   Discharge Instructions      Shelley Mitchell were treated in the hospital for high blood pressure, which I think is the cause of the visual disturbances and other symptoms you experienced. We are discharging you now that you are doing better. To help assist you on your road to recovery, I have written the following recommendations:   For your blood pressure, discontinue: - Hydrochlorothiazide - Furosemide - Nifedipine - Clonidine  Start taking: - Amlodipine 5 mg daily - Losartan 25 mg daily  It was a privilege to be a part of your hospital care team, and I hope you  feel better as a result of your stay.  All the best, Nani Gasser, MD     Nani Gasser MD 12/27/2022, 12:07 PM

## 2022-12-27 NOTE — Hospital Course (Signed)
Active Problems:   * No active hospital problems. *  Principal Problem (Resolved):   Dizziness  Consults:***  Procedures:***  Follow-up items:***

## 2023-01-10 ENCOUNTER — Ambulatory Visit: Payer: Medicare PPO | Attending: Medical | Admitting: Medical

## 2023-01-10 ENCOUNTER — Other Ambulatory Visit
Admission: RE | Admit: 2023-01-10 | Discharge: 2023-01-10 | Disposition: A | Discharge status: Home / Self Care | Payer: Medicare PPO | Source: Ambulatory Visit | Attending: Medical | Admitting: Medical

## 2023-01-10 ENCOUNTER — Ambulatory Visit (INDEPENDENT_AMBULATORY_CARE_PROVIDER_SITE_OTHER): Payer: Medicare PPO

## 2023-01-10 ENCOUNTER — Encounter: Payer: Self-pay | Admitting: Medical

## 2023-01-10 VITALS — BP 130/68 | HR 48 | Ht 60.0 in | Wt 249.9 lb

## 2023-01-10 DIAGNOSIS — R001 Bradycardia, unspecified: Secondary | ICD-10-CM | POA: Diagnosis present

## 2023-01-10 DIAGNOSIS — I5032 Chronic diastolic (congestive) heart failure: Secondary | ICD-10-CM | POA: Diagnosis not present

## 2023-01-10 DIAGNOSIS — R42 Dizziness and giddiness: Secondary | ICD-10-CM | POA: Diagnosis present

## 2023-01-10 DIAGNOSIS — I10 Essential (primary) hypertension: Secondary | ICD-10-CM

## 2023-01-10 LAB — BASIC METABOLIC PANEL
Anion gap: 10 (ref 5–15)
BUN: 18 mg/dL (ref 8–23)
CO2: 26 mmol/L (ref 22–32)
Calcium: 9.3 mg/dL (ref 8.9–10.3)
Chloride: 101 mmol/L (ref 98–111)
Creatinine, Ser: 0.57 mg/dL (ref 0.44–1.00)
GFR, Estimated: >60 mL/min (ref >60)
Glucose, Bld: 98 mg/dL (ref 70–99)
Potassium: 4 mmol/L (ref 3.5–5.1)
Sodium: 137 mmol/L (ref 135–145)

## 2023-01-10 NOTE — Progress Notes (Signed)
Cardiology Office Note:    Date:  01/10/2023   ID:  Shelley Mitchell, Shelley Mitchell [birth year], MRN [id number]  PCP:  [name], MD  [facility] Cardiologist:  Kate Sable, MD  [place] Electrophysiologist:  None   Referring MD: [name], MD   Chief Complaint: Hospital follow-up  History of Present Illness:    Shelley Mitchell is a 82 y.o. female with a hx of HTN, anxiety, chronic back pain, and HLD, who presents for hospital follow-up.   Echo from 03/2022 for [facility] showed LVEF 60-65%, G2DD, normal RVSF.  The patient was recently admitted with dizziness found to have sinus bradycardia. Clonidine had been previously stopped. EKG showed SB, 1st degree AV block, no high grade block or pauses. Overnight, heart rate into the upper 30s. Echo showed LVEF 60-65%, no WMA, mild LVH, G1DD, normal pulmonary artery systolic pressure, trivial MR.   Today, the patient is at rehab center. She is gaining strength back and making slow progress. She uses a wheelchair all the time. She denies recurrent dizziness. She denies chest pain. Had one episode of SOB. She reports dependent edema. She had a sleep study and they are recommending a CPAP. She can get the CPAP once she is out of the facility.    Past Medical History:  Diagnosis Date   Endometrial polyp    Dr. Elonda Husky   History of endometrial hyperplasia    HTN (hypertension)    Hx of adenomatous colonic polyps 1999   Hyperlipidemia    Mental disorder    GAD   Obesity    Osteoarthritis    Tachycardia    subsequently with bradycardia (?secondary to meds)    Past Surgical History:  Procedure Laterality Date   BACK SURGERY     bilateral knee replacements  2002/2003   CHOLECYSTECTOMY     COLONOSCOPY  03/2002   Dr. Jamesetta Geralds internal hemorrhoids   COLONOSCOPY  06/21/2011   polyp at IC valve (tubular adenoma)   COLONOSCOPY N/A 07/15/2016   one 8 mm polyp at hepatic flexure (tubular adenoma), on 12 mm polyp at hepatic flexure (tubular  adenoma), 3 year surveillance if health permits   COLONOSCOPY N/A 10/10/2019   descending colon diverticulosis, two 4-5 mm polyps at splenic flexure (tubular adenomas). No surveillance due to age.    LUMBAR LAMINECTOMY/DECOMPRESSION MICRODISCECTOMY N/A 04/27/2022   Procedure: Thoracic Eleven-Twelve Decompression;  Surgeon: Ashok Pall, MD;  Location: [facility];  Service: Neurosurgery;  Laterality: N/A;   POLYPECTOMY  07/15/2016   Procedure: POLYPECTOMY;  Surgeon: Daneil Dolin, MD;  [facility];  Service: Endoscopy;;  Splenic Flexure polyps x 2 removed via hot snare   POLYPECTOMY  10/10/2019   Procedure: POLYPECTOMY;  Surgeon: Daneil Dolin, MD;  [facility];  Service: Endoscopy;;   TUBAL LIGATION      Current Medications: Current Meds  Medication Sig   acetaminophen (TYLENOL) 500 MG tablet Take 1,000 mg by mouth every 12 (twelve) hours.   albuterol (VENTOLIN HFA) 108 (90 Base) MCG/ACT inhaler Inhale 2 puffs into the lungs every 8 (eight) hours as needed for wheezing or shortness of breath.   Amino Acids-Protein Hydrolys (PRO-STAT AWC) LIQD Take 30 mLs by mouth 2 (two) times daily between meals.   amLODipine (NORVASC) 5 MG tablet Take 1 tablet (5 mg total) by mouth daily.   aspirin EC 81 MG tablet Take 81 mg by mouth daily. Swallow whole.   cloNIDine (CATAPRES - DOSED IN MG/24 HR) 0.1  mg/24hr patch Place 0.1 mg onto the skin daily.   furosemide (LASIX) 20 MG tablet Take 20 mg by mouth daily.   hydrALAZINE (APRESOLINE) 25 MG tablet Take 25 mg by mouth daily.   hydrochlorothiazide (HYDRODIURIL) 25 MG tablet Take 25 mg by mouth daily.   latanoprost (XALATAN) 0.005 % ophthalmic solution Place 1 drop into both eyes at bedtime.   levothyroxine (SYNTHROID) 25 MCG tablet Take 25 mcg by mouth daily before breakfast.   losartan (COZAAR) 25 MG tablet Take 1 tablet (25 mg total) by mouth daily.   lubiprostone (AMITIZA) 8 MCG capsule Take 1 capsule (8 mcg total) by mouth daily with  breakfast.   medroxyPROGESTERone (PROVERA) 10 MG tablet TAKE 1 TABLET BY MOUTH DAILY 10 DAYS PER MONTH (Patient taking differently: Take 10 mg by mouth See admin instructions. Take 10 mg once daily on the 1st-10th day of each month)   Multiple Vitamins-Minerals (MULTIVITAMIN WITH MINERALS) tablet Take 1 tablet by mouth daily.   nystatin powder Apply 1 Application topically 3 (three) times daily.   polyethylene glycol powder (GLYCOLAX/MIRALAX) 17 GM/SCOOP powder Take 17 g by mouth daily.   pravastatin (PRAVACHOL) 40 MG tablet Take 40 mg by mouth at bedtime.   sertraline (ZOLOFT) 100 MG tablet Take 100 mg by mouth daily.     Allergies:   Ace inhibitors and Nsaids   Social History   Socioeconomic History   Marital status: Married    Spouse name: Not on file   Number of children: 4   Years of education: Not on file   Highest education level: Not on file  Occupational History    Employer: RETIRED  Tobacco Use   Smoking status: Never   Smokeless tobacco: Never  Vaping Use   Vaping Use: Never used  Substance and Sexual Activity   Alcohol use: No    Alcohol/week: 0.0 standard drinks of alcohol   Drug use: No   Sexual activity: Not Currently    Birth control/protection: Post-menopausal  Other Topics Concern   Not on file  Social History Narrative   Not on file   Social Determinants of Health   Financial Resource Strain: Not on file  Food Insecurity: No Food Insecurity (12/26/2022)   Hunger Vital Sign    Worried About Running Out of Food in the Last Year: Never true    Ran Out of Food in the Last Year: Never true  Transportation Needs: No Transportation Needs (12/26/2022)   PRAPARE - Hydrologist (Medical): No    Lack of Transportation (Non-Medical): No  Physical Activity: Not on file  Stress: Not on file  Social Connections: Not on file     Family History: The patient's family history includes Cancer in her father; Colon polyps in her brother;  Diabetes in her brother and sister; Stroke (age of onset: 73) in her mother. There is no history of Colon cancer.  ROS:   Please see the history of present illness.     All other systems reviewed and are negative.  EKGs/Labs/Other Studies Reviewed:    The following studies were reviewed today:  Echo 12/2022 1. Left ventricular ejection fraction, by estimation, is 60 to 65%. The  left ventricle has normal function. The left ventricle has no regional  wall motion abnormalities. There is mild left ventricular hypertrophy.  Left ventricular diastolic parameters  are consistent with Grade I diastolic dysfunction (impaired relaxation).   2. Right ventricular systolic function is normal. The right ventricular  size is normal. There is normal pulmonary artery systolic pressure. The  estimated right ventricular systolic pressure is 16.1 mmHg.   3. The mitral valve is normal in structure. Trivial mitral valve  regurgitation. No evidence of mitral stenosis.   4. The aortic valve is tricuspid. Aortic valve regurgitation is not  visualized. Aortic valve sclerosis/calcification is present, without any  evidence of aortic stenosis.   5. The inferior vena cava is normal in size with greater than 50%  respiratory variability, suggesting right atrial pressure of 3 mmHg.   EKG:  EKG is ordered today.  The ekg ordered today demonstrates SB, 48bpm, no T wave changes  Recent Labs: 04/30/2022: ALT 55 12/23/2022: TSH 2.994 12/26/2022: Magnesium 2.0 12/27/2022: BUN 19; Creatinine, Ser 0.68; Hemoglobin 11.2; Platelets 187; Potassium 4.3; Sodium 136  Recent Lipid Panel    Component Value Date/Time   CHOL 160 04/24/2022 0341   TRIG 83 04/24/2022 0341   HDL 67 04/24/2022 0341   CHOLHDL 2.4 04/24/2022 0341   VLDL 17 04/24/2022 0341   LDLCALC 76 04/24/2022 0341   Physical Exam:    VS:  BP 130/68 (BP Location: Left Arm)   Pulse (!) 48   Ht 5' (1.524 m)   Wt 249 lb 14.4 oz (113.4 kg)   SpO2 97%   BMI  48.81 kg/m     Wt Readings from Last 3 Encounters:  01/10/23 249 lb 14.4 oz (113.4 kg)  12/27/22 249 lb 9 oz (113.2 kg)  12/23/22 242 lb (109.8 kg)     GEN:  Well nourished, well developed in no acute distress HEENT: Normal NECK: No JVD; No carotid bruits LYMPHATICS: No lymphadenopathy CARDIAC: RRR, no murmurs, rubs, gallops RESPIRATORY:  Clear to auscultation without rales, wheezing or rhonchi  ABDOMEN: Soft, non-tender, non-distended MUSCULOSKELETAL:  No edema; No deformity  SKIN: Warm and dry NEUROLOGIC:  Alert and oriented x 3 PSYCHIATRIC:  Normal affect   ASSESSMENT:    1. Bradycardia   2. Primary hypertension   3. Lightheaded   4. Chronic diastolic heart failure (HCC)    PLAN:    In order of problems listed above:  Bradycardia Recent hospitalization with dizziness found to have bradycardia with HR into the upper 30s, worse overnight. She was seen by EP who didn't note high grade AV block, no indication for a PPM. They recommended a sleep study and a heart monitor. She had a sleep study and will need a CPAP. She is currently at a SNF and unable to use a CPAP machine. EKG today shows SB with HR 48bpm. I will order a 30 day live heart monitor. Avoid clonidine as it may contribute to bradcyardia.  HTN BP is good today, continue Losartan '25mg'$  daily, HCTZ '25mg'$  daily, amlodipine '5mg'$  daily, Hydralazine '25mg'$   PRN and clonidine 0.'1mg'$  PRN. Avoid clonidine as above.   HFpEF The patient is overall euvolemic on exam. She is taking lasix '20mg'$  daily. BMET today. Recent echo showed LVEF 60-65%, no WMA, mild LVH, G1DD, trivial MR.   Disposition: Follow up in 2 month(s) with MD/APP    Signed, [place] Kappes Ninfa Meeker, PA-C  01/10/2023 4:08 PM    [facility]

## 2023-01-10 NOTE — Patient Instructions (Addendum)
Medication Instructions:  Your physician recommends that you continue on your current medications as directed. Please refer to the Current Medication list given to you today.  *If you need a refill on your cardiac medications before your next appointment, please call your pharmacy*   Lab Work: BMP to be drawn today - Please go to the [facility]. You will check in at the front desk to the right as you walk into the atrium. Valet Parking is offered if needed. - No appointment needed. You may go any day between 7 am and 6 pm.  If you have labs (blood work) drawn today and your tests are completely normal, you will receive your results only by: [place] (if you have MyChart) OR A paper copy in the mail If you have any lab test that is abnormal or we need to change your treatment, we will call you to review the results.   Testing/Procedures: ZIO AT Long term monitor-Live Telemetry  Your physician has requested you wear a ZIO patch monitor for 28 days. You will receive two monitors to be worn consecutively. This is a single patch monitor. [employer] supplies one patch monitor per enrollment. Additional  stickers are not available.  Please do not apply patch if you will be having a Nuclear Stress Test, Echocardiogram, Cardiac CT, MRI,  or Chest Xray during the period you would be wearing the monitor. The patch cannot be worn during  these tests. You cannot remove and re-apply the ZIO AT patch monitor.  Your ZIO patch monitor will be mailed 3 day USPS to your address on file. It may take 3-5 days to  receive your monitor after you have been enrolled.  Once you have received your monitor, please review the enclosed instructions. Your monitor has  already been registered assigning a specific monitor serial # to you.   Billing and Patient Assistance Program information  Shelley Mitchell has been supplied with any insurance information on record for billing. [employer] offers a sliding scale  Patient Assistance Program for patients without insurance, or whose  insurance does not completely cover the cost of the ZIO patch monitor. You must apply for the  Patient Assistance Program to qualify for the discounted rate. To apply, call [employer] at [phone],  select option 4, select option 2 , ask to apply for the Patient Assistance Program, (you can request an  interpreter if needed). [employer] will ask your household income and how many people are in your  household. [employer] will quote your out-of-pocket cost based on this information. They will also be able  to set up a 12 month interest free payment plan if needed.  Applying the monitor   Shave hair from upper left chest.  Hold the abrader disc by orange tab. Rub the abrader in 40 strokes over left upper chest as indicated in  your monitor instructions.  Clean area with 4 enclosed alcohol pads. Use all pads to ensure the area is cleaned thoroughly. Let  dry.  Apply patch as indicated in monitor instructions. Patch will be placed under collarbone on left side of  chest with arrow pointing upward.  Rub patch adhesive wings for 2 minutes. Remove the white label marked "1". Remove the white label  marked "2". Rub patch adhesive wings for 2 additional minutes.  While looking in a mirror, press and release button in center of patch. A small green light will flash 3-4  times. This will be your only indicator that the monitor has been  turned on.  Do not shower for the first 24 hours. You may shower after the first 24 hours.  Press the button if you feel a symptom. You will hear a small click. Record Date, Time and Symptom in  the Patient Log.   Starting the Gateway  In your kit there is a Hydrographic surveyor box the size of a cellphone. This is Airline pilot. It transmits all your  recorded data to [facility]. This box must always stay within 10 feet of you. Open the box and push the *  button. There will be a light that blinks orange and then  green a few times. When the light stops  blinking, the Gateway is connected to the ZIO patch. Call [employer] at [phone] to confirm your monitor is transmitting.  Returning your monitor  Remove your patch and place it inside the [place]. In the lower half of the Gateway there is a white  bag with prepaid postage on it. Place Gateway in bag and seal. Mail package back to [place] as soon as  possible. Your physician should have your final report approximately 7 days after you have mailed back  your monitor. Call [place] at [phone] if you have questions regarding your ZIO AT  patch monitor. Call them immediately if you see an orange light blinking on your monitor.  If your monitor falls off in less than 4 days, contact our Monitor department at [phone]. If your  monitor becomes loose or falls off after 4 days call [employer] at [phone] for suggestions on  securing your monitor   Follow-Up: At [facility], you and your health needs are our priority.  As part of our continuing mission to provide you with exceptional heart care, we have created designated Provider Care Teams.  These Care Teams include your primary Cardiologist (physician) and Advanced Practice Providers (APPs -  Physician Assistants and Nurse Practitioners) who all work together to provide you with the care you need, when you need it.  We recommend signing up for the patient portal called "MyChart".  Sign up information is provided on this After Visit Summary.  MyChart is used to connect with patients for Virtual Visits (Telemedicine).  Patients are able to view lab/test results, encounter notes, upcoming appointments, etc.  Non-urgent messages can be sent to your provider as well.   To learn more about what you can do with MyChart, go to [email or web address].    Your next appointment:   2 month(s)  Provider:   You may see Kate Sable, MD or one of the following  Advanced Practice Providers on your designated Care Team:   Murray Hodgkins, NP Christell Faith, PA-C [name], PA-C Gerrie Nordmann, NP

## 2023-01-13 NOTE — Progress Notes (Signed)
[facility] Note  01/14/2023     CHIEF COMPLAINT Patient presents for Retina Follow Up   HISTORY OF PRESENT ILLNESS: Shelley Mitchell is a 82 y.o. female who presents to the clinic today for:   HPI     Retina Follow Up   Patient presents with  CRVO/BRVO.  In left eye.  This started 4 weeks ago.  I, the attending physician,  performed the HPI with the patient and updated documentation appropriately.        Comments   Patient here for 4 weeks retina follow up for BRVO OS. Patient states vision doing a little better. No eye pain.       Last edited by Bernarda Caffey, MD on 01/14/2023 12:22 PM.    Pt states no problems after the injection. Patient wants to know how many shots she will be getting. She feels that vision is the same.   Referring physician: [name], MD [place],  [place] [id number]  HISTORICAL INFORMATION:   Selected notes from the MEDICAL RECORD NUMBER Referred by Dr. Herbert Deaner for concern of BDR / PDR LEE:  Ocular Hx- PMH-    CURRENT MEDICATIONS: Current Outpatient Medications (Ophthalmic Drugs)  Medication Sig   latanoprost (XALATAN) 0.005 % ophthalmic solution Place 1 drop into both eyes at bedtime.   No current facility-administered medications for this visit. (Ophthalmic Drugs)   Current Outpatient Medications (Other)  Medication Sig   acetaminophen (TYLENOL) 500 MG tablet Take 1,000 mg by mouth every 12 (twelve) hours.   albuterol (VENTOLIN HFA) 108 (90 Base) MCG/ACT inhaler Inhale 2 puffs into the lungs every 8 (eight) hours as needed for wheezing or shortness of breath.   Amino Acids-Protein Hydrolys (PRO-STAT AWC) LIQD Take 30 mLs by mouth 2 (two) times daily between meals.   amLODipine (NORVASC) 5 MG tablet Take 1 tablet (5 mg total) by mouth daily.   aspirin EC 81 MG tablet Take 81 mg by mouth daily. Swallow whole.   cloNIDine (CATAPRES - DOSED IN MG/24 HR) 0.1 mg/24hr patch Place 0.1 mg onto the skin  daily.   furosemide (LASIX) 20 MG tablet Take 20 mg by mouth daily.   hydrALAZINE (APRESOLINE) 25 MG tablet Take 25 mg by mouth daily.   hydrochlorothiazide (HYDRODIURIL) 25 MG tablet Take 25 mg by mouth daily.   levothyroxine (SYNTHROID) 25 MCG tablet Take 25 mcg by mouth daily before breakfast.   losartan (COZAAR) 25 MG tablet Take 1 tablet (25 mg total) by mouth daily.   lubiprostone (AMITIZA) 8 MCG capsule Take 1 capsule (8 mcg total) by mouth daily with breakfast.   medroxyPROGESTERone (PROVERA) 10 MG tablet TAKE 1 TABLET BY MOUTH DAILY 10 DAYS PER MONTH (Patient taking differently: Take 10 mg by mouth See admin instructions. Take 10 mg once daily on the 1st-10th day of each month)   Multiple Vitamins-Minerals (MULTIVITAMIN WITH MINERALS) tablet Take 1 tablet by mouth daily.   nystatin powder Apply 1 Application topically 3 (three) times daily.   polyethylene glycol powder (GLYCOLAX/MIRALAX) 17 GM/SCOOP powder Take 17 g by mouth daily.   pravastatin (PRAVACHOL) 40 MG tablet Take 40 mg by mouth at bedtime.   sertraline (ZOLOFT) 100 MG tablet Take 100 mg by mouth daily.   No current facility-administered medications for this visit. (Other)   REVIEW OF SYSTEMS: ROS   Positive for: Eyes, Respiratory Last edited by Theodore Demark, COA on 01/14/2023  9:47 AM.  ALLERGIES Allergies  Allergen Reactions   Ace Inhibitors Itching   Nsaids Itching   PAST MEDICAL HISTORY Past Medical History:  Diagnosis Date   Endometrial polyp    Dr. Elonda Husky   History of endometrial hyperplasia    HTN (hypertension)    Hx of adenomatous colonic polyps 1999   Hyperlipidemia    Mental disorder    GAD   Obesity    Osteoarthritis    Tachycardia    subsequently with bradycardia (?secondary to meds)   Past Surgical History:  Procedure Laterality Date   BACK SURGERY     bilateral knee replacements  2002/2003   CHOLECYSTECTOMY     COLONOSCOPY  03/2002   Dr. Jamesetta Geralds internal hemorrhoids    COLONOSCOPY  06/21/2011   polyp at IC valve (tubular adenoma)   COLONOSCOPY N/A 07/15/2016   one 8 mm polyp at hepatic flexure (tubular adenoma), on 12 mm polyp at hepatic flexure (tubular adenoma), 3 year surveillance if health permits   COLONOSCOPY N/A 10/10/2019   descending colon diverticulosis, two 4-5 mm polyps at splenic flexure (tubular adenomas). No surveillance due to age.    LUMBAR LAMINECTOMY/DECOMPRESSION MICRODISCECTOMY N/A 04/27/2022   Procedure: Thoracic Eleven-Twelve Decompression;  Surgeon: Ashok Pall, MD;  Location: [facility];  Service: Neurosurgery;  Laterality: N/A;   POLYPECTOMY  07/15/2016   Procedure: POLYPECTOMY;  Surgeon: Daneil Dolin, MD;  [facility];  Service: Endoscopy;;  Splenic Flexure polyps x 2 removed via hot snare   POLYPECTOMY  10/10/2019   Procedure: POLYPECTOMY;  Surgeon: Daneil Dolin, MD;  [facility];  Service: Endoscopy;;   TUBAL LIGATION     FAMILY HISTORY Family History  Problem Relation Age of Onset   Diabetes Brother    Colon polyps Brother    Diabetes Sister    Cancer Father        Likely prostate   Stroke Mother 78   Colon cancer Neg Hx    SOCIAL HISTORY Social History   Tobacco Use   Smoking status: Never   Smokeless tobacco: Never  Vaping Use   Vaping Use: Never used  Substance Use Topics   Alcohol use: No    Alcohol/week: 0.0 standard drinks of alcohol   Drug use: No       OPHTHALMIC EXAM:  Base Eye Exam     Visual Acuity (Snellen - Linear)       Right Left   Dist cc 20/40 -2 20/100 -1   Dist ph cc 20/40 +2 20/80 -1    Correction: Glasses         Tonometry (Tonopen, 9:45 AM)       Right Left   Pressure 16 13         Pupils       Dark Light Shape React APD   Right 3 2 Round Minimal None   Left 3 2 Round Minimal None         Visual Fields (Counting fingers)       Left Right    Full Full         Extraocular Movement       Right Left    Full, Ortho Full, Ortho          Neuro/Psych     Oriented x3: Yes   Mood/Affect: Normal         Dilation     Both eyes: 1.0% Mydriacyl, 2.5% Phenylephrine @ 9:45 AM  Slit Lamp and Fundus Exam     Slit Lamp Exam       Right Left   Lids/Lashes Dermatochalasis - upper lid Dermatochalasis - upper lid   Conjunctiva/Sclera mild melanosis mild melanosis   Cornea arcus arcus   Anterior Chamber deep and clear deep and clear   Iris Round and dilated Round and dilated   Lens 2+ Nuclear sclerosis, 2+ Cortical cataract 2-3+ Nuclear sclerosis, 2-3+ Cortical cataract   Anterior Vitreous Vitreous syneresis, Posterior vitreous detachment, vitreous condensations Vitreous syneresis, Posterior vitreous detachment         Fundus Exam       Right Left   Disc nasal hyperemia -- improved, +heme, sharp rim, temporal elevation, PPA, mild temporal pallor, peripapillary subretinal heme superior and nasal to disc -- all improved Pink and Sharp, mild tilt, +PPA   C/D Ratio 0.3 0.3   Macula Flat, good foveal reflex, inferior macula atrophic, RPE mottling and clumping, No heme Blunted foveal reflex, central and inferior IRH and exudates -- improved, central cystic changes -- persistent, retinal macroanuerysms inferior macula, scattered DBH -- improved   Vessels attenuated, Tortuous attenuated, Tortuous, retinal macroaneurysms along IT arcades   Periphery Attached, peripapillary subretinal heme superior and nasal to disc -- improved, peripheral subretinal heme and exudates temporal periphery -- improved Attached, scattered IRH           Refraction     Wearing Rx       Sphere Cylinder Axis Add   Right -2.00 +0.75 165 +2.00   Left -1.25 +0.50 148 +2.00           IMAGING AND PROCEDURES  Imaging and Procedures for 01/14/2023  OCT, Retina - OU - Both Eyes       Right Eye Quality was good. Central Foveal Thickness: 236. Progression has improved. Findings include normal foveal contour, no IRF, myopic  contour, intraretinal hyper-reflective material, subretinal fluid, inner retinal atrophy (Diffuse IRA inferior macula / hemisphere -- ?remote BRAO; stable improvement in disc edema, trace cystic changes SN macula -- improved from prior).   Left Eye Quality was good. Central Foveal Thickness: 279. Progression has been stable. Findings include abnormal foveal contour, myopic contour, retinal drusen , subretinal hyper-reflective material, intraretinal hyper-reflective material, intraretinal fluid, outer retinal atrophy (Elevation of disc, central ORA, drusen, SRHM, persistent in IRF greatest temporal fovea).   Notes *Images captured and stored on drive  Diagnosis / Impression:  OD: Diffuse IRA inferior macula / hemisphere -- ?remote BRAO; stable improvement in disc edema, trace cystic changes SN macula -- improved from prior [id number] of disc, central ORA, drusen, SRHM, persistent in IRF greatest temporal fovea  Clinical management:  See below  Abbreviations: NFP - Normal foveal profile. CME - cystoid macular edema. PED - pigment epithelial detachment. IRF - intraretinal fluid. SRF - subretinal fluid. EZ - ellipsoid zone. ERM - epiretinal membrane. ORA - outer retinal atrophy. ORT - outer retinal tubulation. SRHM - subretinal hyper-reflective material. IRHM - intraretinal hyper-reflective material      Intravitreal Injection, Pharmacologic Agent - OS - Left Eye       Time Out 01/14/2023. 10:48 AM. Confirmed correct patient, procedure, site, and patient consented.   Anesthesia Topical anesthesia was used. Anesthetic medications included Lidocaine 2%, Proparacaine 0.5%.   Procedure Preparation included 5% betadine to ocular surface, eyelid speculum. A supplied (32g) needle was used.   Injection: 1.25 mg Bevacizumab 1.8m/0.05ml   Route: Intravitreal, Site: Left Eye   NDC:[id number] Lot: [id number]$ , Expiration  date: 02/11/2023   Post-op Post injection exam found visual acuity of  at least counting fingers. The patient tolerated the procedure well. There were no complications. The patient received written and verbal post procedure care education. Post injection medications were not given.            ASSESSMENT/PLAN:    ICD-10-CM   1. 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 Essential hypertension  I10     5. Hypertensive retinopathy of both eyes  H35.033     6. Combined forms of age-related cataract of both eyes  H25.813      1,2. BRVO w/ CME and retinal macronauerysm OS  - s/p IVA OS #1 (12.15.23) #2 (01.12.24),  - pt previously followed with Dr. Jeneen Rinks in [place], [place], but now lives in a facility in [place] and cannot be transported to [facility] - history of laser photoablation of retinal macroaneurysm OS per chart review - FA 12.15.23 shows focal MAs w/ late leakage - BCVA OS 20/80- stable - exam shows macroaneurysm with surrounding circinate exudates and edema, inferior macula OS -- imprving - OCT shows OS: Elevation of disc, central ORA, drusen, SRHM, persistent in IRF greatest temporal fovea - recommend IVA OS #3 (02.09 2024) - RBA of procedure discussed, questions answered - IVA informed consent obtained and signed, 12.15.23 (OS) - see procedure note - F/U 4-5 weeks -- DFE/OCT/possible injection  3. BRAO OD - OCT shows Diffuse IRA inferior macula / hemisphere -- ?remote BRAO; stable improvement in disc edema, trace cystic changes SN macula -- improved from prior  - exam shows peripapillary hemorrhage, mild NVD nasal disc -- improving  - FA 12.15.23 shows mild NVD nasal disc w/ leakage  - pt may benefit from PRP and anti-VEGF therapy  4,5. Hypertensive retinopathy OU - discussed  importance of tight BP control - continue to monitor  6. Mixed Cataract OU - The symptoms of cataract, surgical options, and treatments and risks were discussed with patient. - discussed diagnosis and progression - likely visually significant - under the expert management of Dr. Herbert Deaner - clear from a retina standpoint to proceed with cataract surgery when pt and surgeon are ready  - would recommend timing cataract surgery ~1-2 wks after anti-VEGF injection  Ophthalmic Meds Ordered this visit:  Meds ordered this encounter  Medications   Bevacizumab (AVASTIN) SOLN 1.25 mg     Return in about 4 weeks (around 02/11/2023) for f/u BRVO w/ CME OS , DFE, OCT, Possible, IVA, OS.  There are no Patient Instructions on file for this visit.   This document serves as a record of services personally performed by Gardiner Sleeper, MD, PhD. It was created on their behalf by Joetta Manners COT, an ophthalmic technician. The creation of this record is the provider's dictation and/or activities during the visit.    Electronically signed by: Joetta Manners COT 02/09/202412:23 PM  This document serves as a record of services personally performed by Gardiner Sleeper, MD, PhD. It was created on their behalf by Renaldo Reel, [place] an ophthalmic technician. The creation of this record is the provider's dictation and/or activities during the visit.  Electronically signed by:  Renaldo Reel, COT  01/14/23 12:23 PM  Gardiner Sleeper, M.D., Ph.D. Diseases & Surgery of the Retina and [place] 01/14/2023   I have reviewed the above documentation for accuracy and completeness, and I agree with the above. Gardiner Sleeper, M.D., Ph.D. 01/14/23 12:26 PM    Abbreviations: M myopia (nearsighted); A astigmatism; H hyperopia (farsighted); P presbyopia; Mrx spectacle prescription;  CTL contact lenses; OD right eye; OS left eye; OU both eyes  XT exotropia; ET  esotropia; PEK punctate epithelial keratitis; PEE punctate epithelial erosions; DES dry eye syndrome; MGD meibomian gland dysfunction; ATs artificial tears; PFAT's preservative free artificial tears; [place] nuclear sclerotic cataract; PSC posterior subcapsular cataract; ERM epi-retinal membrane; PVD posterior vitreous detachment; RD retinal detachment; DM diabetes mellitus; DR diabetic retinopathy; NPDR non-proliferative diabetic retinopathy; PDR proliferative diabetic retinopathy; CSME clinically significant macular edema; DME diabetic macular edema; dbh dot blot hemorrhages; CWS cotton wool spot; POAG primary open angle glaucoma; C/D cup-to-disc ratio; HVF humphrey visual field; GVF goldmann visual field; OCT optical coherence tomography; IOP intraocular pressure; BRVO Branch retinal vein occlusion; CRVO central retinal vein occlusion; CRAO central retinal artery occlusion; BRAO branch retinal artery occlusion; RT retinal tear; SB scleral buckle; PPV pars plana vitrectomy; VH Vitreous hemorrhage; PRP panretinal laser photocoagulation; IVK intravitreal kenalog; VMT vitreomacular traction; MH Macular hole;  NVD neovascularization of the disc; NVE neovascularization elsewhere; AREDS age related eye disease study; ARMD age related macular degeneration; POAG primary open angle glaucoma; EBMD epithelial/anterior basement membrane dystrophy; ACIOL anterior chamber intraocular lens; IOL intraocular lens; PCIOL posterior chamber intraocular lens; Phaco/IOL phacoemulsification with intraocular lens placement; [place] photorefractive keratectomy; LASIK laser assisted in situ keratomileusis; HTN hypertension; DM diabetes mellitus; COPD chronic obstructive pulmonary disease

## 2023-01-14 ENCOUNTER — Ambulatory Visit (INDEPENDENT_AMBULATORY_CARE_PROVIDER_SITE_OTHER): Payer: Medicare PPO | Admitting: Ophthalmology

## 2023-01-14 ENCOUNTER — Encounter (INDEPENDENT_AMBULATORY_CARE_PROVIDER_SITE_OTHER): Payer: Self-pay | Admitting: Ophthalmology

## 2023-01-14 DIAGNOSIS — H34231 Retinal artery branch occlusion, right eye: Secondary | ICD-10-CM | POA: Diagnosis not present

## 2023-01-14 DIAGNOSIS — H34832 Tributary (branch) retinal vein occlusion, left eye, with macular edema: Secondary | ICD-10-CM

## 2023-01-14 DIAGNOSIS — H3509 Other intraretinal microvascular abnormalities: Secondary | ICD-10-CM | POA: Diagnosis not present

## 2023-01-14 DIAGNOSIS — H35033 Hypertensive retinopathy, bilateral: Secondary | ICD-10-CM

## 2023-01-14 DIAGNOSIS — I10 Essential (primary) hypertension: Secondary | ICD-10-CM | POA: Diagnosis not present

## 2023-01-14 DIAGNOSIS — H25813 Combined forms of age-related cataract, bilateral: Secondary | ICD-10-CM

## 2023-01-14 MED ORDER — BEVACIZUMAB CHEMO INJECTION 1.25MG/0.05ML SYRINGE FOR KALEIDOSCOPE
1.2500 mg | INTRAVITREAL | Status: AC | PRN
  Administered 2023-01-14: 1.25 mg via INTRAVITREAL

## 2023-01-19 DIAGNOSIS — R001 Bradycardia, unspecified: Secondary | ICD-10-CM | POA: Diagnosis not present

## 2023-02-02 DIAGNOSIS — R001 Bradycardia, unspecified: Secondary | ICD-10-CM

## 2023-02-11 NOTE — Progress Notes (Signed)
[facility] - Clinic Note  02/18/2023     CHIEF COMPLAINT Patient presents for Retina Follow Up   HISTORY OF PRESENT ILLNESS: Shelley Mitchell is a 82 y.o. female who presents to the clinic today for:   HPI     Retina Follow Up   Patient presents with  CRVO/BRVO.  In left eye.  This started 4 weeks ago.  I, the attending physician,  performed the HPI with the patient and updated documentation appropriately.        Comments   Patient here for 4 weeks retina follow up for BRVO OS. Patient states vision may be a little better. Not sure. Don't see as may black dots as had. No eye pain.       Last edited by Rennis Chris, MD on 02/18/2023 12:24 PM.     Patient feels that she can not see as many floaters as before.   Referring physician: Mateo Flow, MD [address] [place],  [place] [address]  HISTORICAL INFORMATION:   Selected notes from the MEDICAL RECORD NUMBER Referred by Dr. Elmer Picker for concern of BDR / PDR LEE:  Ocular Hx- PMH-    CURRENT MEDICATIONS: Current Outpatient Medications (Ophthalmic Drugs)  Medication Sig   latanoprost (XALATAN) 0.005 % ophthalmic solution Place 1 drop into both eyes at bedtime.   No current facility-administered medications for this visit. (Ophthalmic Drugs)   Current Outpatient Medications (Other)  Medication Sig   acetaminophen (TYLENOL) 500 MG tablet Take 1,000 mg by mouth every 12 (twelve) hours.   albuterol (VENTOLIN HFA) 108 (90 Base) MCG/ACT inhaler Inhale 2 puffs into the lungs every 8 (eight) hours as needed for wheezing or shortness of breath.   Amino Acids-Protein Hydrolys (PRO-STAT AWC) LIQD Take 30 mLs by mouth 2 (two) times daily between meals.   amLODipine (NORVASC) 5 MG tablet Take 1 tablet (5 mg total) by mouth daily.   aspirin EC 81 MG tablet Take 81 mg by mouth daily. Swallow whole.   cloNIDine (CATAPRES - DOSED IN MG/24 HR) 0.1 mg/24hr patch Place 0.1 mg onto the skin daily.    furosemide (LASIX) 20 MG tablet Take 20 mg by mouth daily.   hydrALAZINE (APRESOLINE) 25 MG tablet Take 25 mg by mouth daily.   hydrochlorothiazide (HYDRODIURIL) 25 MG tablet Take 25 mg by mouth daily.   levothyroxine (SYNTHROID) 25 MCG tablet Take 25 mcg by mouth daily before breakfast.   losartan (COZAAR) 25 MG tablet Take 1 tablet (25 mg total) by mouth daily.   lubiprostone (AMITIZA) 8 MCG capsule Take 1 capsule (8 mcg total) by mouth daily with breakfast.   medroxyPROGESTERone (PROVERA) 10 MG tablet TAKE 1 TABLET BY MOUTH DAILY 10 DAYS PER MONTH (Patient taking differently: Take 10 mg by mouth See admin instructions. Take 10 mg once daily on the 1st-10th day of each month)   Multiple Vitamins-Minerals (MULTIVITAMIN WITH MINERALS) tablet Take 1 tablet by mouth daily.   nystatin powder Apply 1 Application topically 3 (three) times daily.   polyethylene glycol powder (GLYCOLAX/MIRALAX) 17 GM/SCOOP powder Take 17 g by mouth daily.   pravastatin (PRAVACHOL) 40 MG tablet Take 40 mg by mouth at bedtime.   sertraline (ZOLOFT) 100 MG tablet Take 100 mg by mouth daily.   No current facility-administered medications for this visit. (Other)   REVIEW OF SYSTEMS: ROS   Positive for: Musculoskeletal, Cardiovascular, Eyes, Respiratory Last edited by Laddie Aquas, COA on 02/18/2023  9:32 AM.  ALLERGIES Allergies  Allergen Reactions   Ace Inhibitors Itching   Nsaids Itching   PAST MEDICAL HISTORY Past Medical History:  Diagnosis Date   Endometrial polyp    Dr. Despina Hidden   History of endometrial hyperplasia    HTN (hypertension)    Hx of adenomatous colonic polyps 1999   Hyperlipidemia    Mental disorder    GAD   Obesity    Osteoarthritis    Tachycardia    subsequently with bradycardia (?secondary to meds)   Past Surgical History:  Procedure Laterality Date   BACK SURGERY     bilateral knee replacements  2002/2003   CHOLECYSTECTOMY     COLONOSCOPY  03/2002   Dr. Fleet Contras  internal hemorrhoids   COLONOSCOPY  06/21/2011   polyp at IC valve (tubular adenoma)   COLONOSCOPY N/A 07/15/2016   one 8 mm polyp at hepatic flexure (tubular adenoma), on 12 mm polyp at hepatic flexure (tubular adenoma), 3 year surveillance if health permits   COLONOSCOPY N/A 10/10/2019   descending colon diverticulosis, two 4-5 mm polyps at splenic flexure (tubular adenomas). No surveillance due to age.    LUMBAR LAMINECTOMY/DECOMPRESSION MICRODISCECTOMY N/A 04/27/2022   Procedure: Thoracic Eleven-Twelve Decompression;  Surgeon: Coletta Memos, MD;  Location: [facility] OR;  Service: Neurosurgery;  Laterality: N/A;   POLYPECTOMY  07/15/2016   Procedure: POLYPECTOMY;  Surgeon: Corbin Ade, MD;  Location: [facility];  Service: Endoscopy;;  Splenic Flexure polyps x 2 removed via hot snare   POLYPECTOMY  10/10/2019   Procedure: POLYPECTOMY;  Surgeon: Corbin Ade, MD;  Location: [facility];  Service: Endoscopy;;   TUBAL LIGATION     FAMILY HISTORY Family History  Problem Relation Age of Onset   Diabetes Brother    Colon polyps Brother    Diabetes Sister    Cancer Father        Likely prostate   Stroke Mother 23   Colon cancer Neg Hx    SOCIAL HISTORY Social History   Tobacco Use   Smoking status: Never   Smokeless tobacco: Never  Vaping Use   Vaping Use: Never used  Substance Use Topics   Alcohol use: No    Alcohol/week: 0.0 standard drinks of alcohol   Drug use: No       OPHTHALMIC EXAM:  Base Eye Exam     Visual Acuity (Snellen - Linear)       Right Left   Dist cc 20/40 -2 20/100 -2   Dist ph cc 20/25 -2 20/80 -2    Correction: Glasses         Tonometry (Tonopen, 9:27 AM)       Right Left   Pressure 12 09         Pupils       Dark Light Shape React APD   Right 3 2 Round Minimal None   Left 3 2 Round Minimal None         Visual Fields (Counting fingers)       Left Right    Full Full         Extraocular Movement       Right Left    Full,  Ortho Full, Ortho         Neuro/Psych     Oriented x3: Yes   Mood/Affect: Normal         Dilation     Both eyes: 1.0% Mydriacyl, 2.5% Phenylephrine @ 9:27 AM  Slit Lamp and Fundus Exam     Slit Lamp Exam       Right Left   Lids/Lashes Dermatochalasis - upper lid Dermatochalasis - upper lid   Conjunctiva/Sclera mild melanosis mild melanosis   Cornea arcus arcus   Anterior Chamber deep and clear deep and clear   Iris Round and dilated Round and dilated   Lens 2+ Nuclear sclerosis, 2+ Cortical cataract 2-3+ Nuclear sclerosis, 2-3+ Cortical cataract   Anterior Vitreous Vitreous syneresis, Posterior vitreous detachment, vitreous condensations Vitreous syneresis, Posterior vitreous detachment         Fundus Exam       Right Left   Disc nasal hyperemia -- improved, +heme, sharp rim, temporal elevation, PPA, mild temporal pallor, peripapillary subretinal heme superior and nasal to disc -- all improved Pink and Sharp, mild tilt, +PPA   C/D Ratio 0.3 0.3   Macula Flat, good foveal reflex, inferior macula atrophic, RPE mottling and clumping, No heme Blunted foveal reflex, central and inferior IRH and exudates -- improved, central cystic changes -- improved, retinal macroanuerysms inferior macula, scattered DBH -- improved   Vessels attenuated, Tortuous attenuated, Tortuous, retinal macroaneurysms along IT arcades   Periphery Attached, peripapillary subretinal heme superior and nasal to disc -- improved, peripheral subretinal heme and exudates temporal periphery -- improved Attached, scattered IRH           Refraction     Wearing Rx       Sphere Cylinder Axis Add   Right -2.00 +0.75 165 +2.00   Left -1.25 +0.50 148 +2.00           IMAGING AND PROCEDURES  Imaging and Procedures for 02/18/2023  OCT, Retina - OU - Both Eyes       Right Eye Quality was borderline. Central Foveal Thickness: 235. Progression has been stable. Findings include normal foveal  contour, no IRF, no SRF, myopic contour, intraretinal hyper-reflective material, subretinal fluid, inner retinal atrophy (Diffuse IRA inferior macula / hemisphere -- ?remote BRAO; stable improvement in disc edema).   Left Eye Quality was good. Central Foveal Thickness: 265. Progression has improved. Findings include abnormal foveal contour, myopic contour, retinal drusen , subretinal hyper-reflective material, intraretinal hyper-reflective material, intraretinal fluid, outer retinal atrophy (Elevation of disc, central ORA, drusen, SRHM, interval improvement in IRF/IRHM temporal macula).   Notes *Images captured and stored on drive  Diagnosis / Impression:  OD: Diffuse IRA inferior macula / hemisphere -- ?remote BRAO; stable improvement in disc edema, OS: Elevation of disc, central ORA, drusen, SRHM, interval improvement in IRF/IRHM temporal macula  Clinical management:  See below  Abbreviations: NFP - Normal foveal profile. CME - cystoid macular edema. PED - pigment epithelial detachment. IRF - intraretinal fluid. SRF - subretinal fluid. EZ - ellipsoid zone. ERM - epiretinal membrane. ORA - outer retinal atrophy. ORT - outer retinal tubulation. SRHM - subretinal hyper-reflective material. IRHM - intraretinal hyper-reflective material      Intravitreal Injection, Pharmacologic Agent - OS - Left Eye       Time Out 02/18/2023. 10:25 AM. Confirmed correct patient, procedure, site, and patient consented.   Anesthesia Topical anesthesia was used. Anesthetic medications included Lidocaine 2%, Proparacaine 0.5%.   Procedure Preparation included 5% betadine to ocular surface, eyelid speculum. A supplied (32g) needle was used.   Injection: 1.25 mg Bevacizumab 1.25mg /0.86ml   Route: Intravitreal, Site: Left Eye   NDC: [id number], Lot: [id number], Expiration date: 04/03/2023   Post-op Post injection exam found visual acuity of at least counting  fingers. The patient tolerated the procedure  well. There were no complications. The patient received written and verbal post procedure care education. Post injection medications were not given.            ASSESSMENT/PLAN:    ICD-10-CM   1. 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 Essential hypertension  I10     5. Hypertensive retinopathy of both eyes  H35.033     6. Combined forms of age-related cataract of both eyes  H25.813      1,2. 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 Fayrene Fearing in [place], [place], but now lives in a facility in [place] and cannot be transported to [facility] - history of laser photoablation of retinal macroaneurysm OS per chart review - FA 12.15.23 shows focal MAs w/ late leakage - BCVA OS 20/80- stable - exam shows macroaneurysm with surrounding circinate exudates and edema, inferior macula OS -- imprving - OCT shows OS: Elevation of disc, central ORA, drusen, SRHM, interval improvement in IRF/IRHM temporal macula - recommend IVA OS #4 (03.15 24) - RBA of procedure discussed, questions answered - IVA informed consent obtained and signed, 12.15.23 (OS) - see procedure note - F/U 4 weeks -- DFE/OCT/possible injection  3. BRAO OD - OCT shows Diffuse IRA inferior macula / hemisphere -- ?remote BRAO; stable improvement in disc edema, trace cystic changes SN macula -- improved from prior  - exam shows peripapillary hemorrhage, mild NVD nasal disc -- improving  - FA 12.15.23 shows mild NVD nasal disc w/ leakage  - pt may benefit from PRP and anti-VEGF therapy  4,5. Hypertensive retinopathy OU - discussed importance of tight BP control - continue to  monitor  6. Mixed Cataract OU - The symptoms of cataract, surgical options, and treatments and risks were discussed with patient. - discussed diagnosis and progression - likely visually significant - under the expert management of Dr. Elmer Picker - clear from a retina standpoint to proceed with cataract surgery when pt and surgeon are ready  - would recommend timing cataract surgery ~1-2 wks after anti-VEGF injection  Ophthalmic Meds Ordered this visit:  Meds ordered this encounter  Medications   Bevacizumab (AVASTIN) SOLN 1.25 mg     Return in about 4 weeks (around 03/18/2023) for f/u BRVO OS , DFE, OCT, Possible, IVA, OS.  There are no Patient Instructions on file for this visit.  This document serves as a record of services personally performed by Karie Chimera, MD, PhD. It was created on their behalf by Berlin Hun COT, an ophthalmic technician. The creation of this record is the provider's dictation and/or activities during the visit.    Electronically signed by: Berlin Hun COT 02/11/2023 4:03 AM  This document serves as a record of services personally performed by Karie Chimera, MD, PhD. It was created on their behalf by Gerilyn Nestle, COT an ophthalmic technician. The creation of this record is the provider's dictation and/or activities during the visit.    Electronically signed by:  Gerilyn Nestle, COT  03.15.24 4:03 AM  Karie Chimera,  M.D., Ph.D. Diseases & Surgery of the Retina and Vitreous [facility] 02/18/2023   I have reviewed the above documentation for accuracy and completeness, and I agree with the above. Karie Chimera, M.D., Ph.D. 02/20/23 4:03 AM   Abbreviations: M myopia (nearsighted); A astigmatism; H hyperopia (farsighted); P presbyopia; Mrx spectacle prescription;  CTL contact lenses; OD right eye; OS left eye; OU both eyes  XT exotropia; ET esotropia; PEK punctate epithelial keratitis; PEE punctate  epithelial erosions; DES dry eye syndrome; MGD meibomian gland dysfunction; ATs artificial tears; PFAT's preservative free artificial tears; NSC nuclear sclerotic cataract; PSC posterior subcapsular cataract; ERM epi-retinal membrane; PVD posterior vitreous detachment; RD retinal detachment; DM diabetes mellitus; DR diabetic retinopathy; NPDR non-proliferative diabetic retinopathy; PDR proliferative diabetic retinopathy; CSME clinically significant macular edema; DME diabetic macular edema; dbh dot blot hemorrhages; CWS cotton wool spot; POAG primary open angle glaucoma; C/D cup-to-disc ratio; HVF humphrey visual field; GVF goldmann visual field; OCT optical coherence tomography; IOP intraocular pressure; BRVO Branch retinal vein occlusion; CRVO central retinal vein occlusion; CRAO central retinal artery occlusion; BRAO branch retinal artery occlusion; RT retinal tear; SB scleral buckle; PPV pars plana vitrectomy; VH Vitreous hemorrhage; PRP panretinal laser photocoagulation; IVK intravitreal kenalog; VMT vitreomacular traction; MH Macular hole;  NVD neovascularization of the disc; NVE neovascularization elsewhere; AREDS age related eye disease study; ARMD age related macular degeneration; POAG primary open angle glaucoma; EBMD epithelial/anterior basement membrane dystrophy; ACIOL anterior chamber intraocular lens; IOL intraocular lens; PCIOL posterior chamber intraocular lens; Phaco/IOL phacoemulsification with intraocular lens placement; PRK photorefractive keratectomy; LASIK laser assisted in situ keratomileusis; HTN hypertension; DM diabetes mellitus; COPD chronic obstructive pulmonary disease

## 2023-02-14 ENCOUNTER — Telehealth: Payer: Self-pay | Admitting: Medical

## 2023-02-14 NOTE — Telephone Encounter (Signed)
Call received back from [place] with [facility]. She confirmed the patient's 1st ZIO AT was mailed back by [facility] staff on Wednesday 02/09/23, so this may still be en route to [employer]. She did have her 2nd ZIO AT placed on 02/09/23 and is still currently wearing this.   I thanked [name] for the call back and follow up.

## 2023-02-14 NOTE — Telephone Encounter (Signed)
Message notification received from [employer] stating the patient's heart monitor ordered by [name], PA at her 01/10/23 office visit is till pending return.   Zio AT Serial #[id number]  I called and spoke with the patient's son & primary contact, Shelley Mitchell (ok per [facility]), to inquire what he knew about the patient's monitor. Per Shelley Mitchell, the patient is still residing at [name], but was told by staff there ~ 2/28 that they would be mailing the patient's monitor back.   I advised I will reach out to [name] to see if they did in fact mail the patient's monitor back to [employer].   Attempted to contact staff at [name]- [phone]. I was able to speak with someone at the switch board and they attempted to transfer me to the patient's care team.  I was able to speak with [name] who cares for the patient. She advised she was not there the day the monitor was switched out, but she will check with the staff who was with her and call  us back.   The patient has a follow up scheduled on 03/14/23 with [name].

## 2023-02-18 ENCOUNTER — Ambulatory Visit (INDEPENDENT_AMBULATORY_CARE_PROVIDER_SITE_OTHER): Payer: Medicare PPO | Admitting: Ophthalmology

## 2023-02-18 ENCOUNTER — Encounter (INDEPENDENT_AMBULATORY_CARE_PROVIDER_SITE_OTHER): Payer: Self-pay | Admitting: Ophthalmology

## 2023-02-18 DIAGNOSIS — H35033 Hypertensive retinopathy, bilateral: Secondary | ICD-10-CM | POA: Diagnosis not present

## 2023-02-18 DIAGNOSIS — H3509 Other intraretinal microvascular abnormalities: Secondary | ICD-10-CM | POA: Diagnosis not present

## 2023-02-18 DIAGNOSIS — H34832 Tributary (branch) retinal vein occlusion, left eye, with macular edema: Secondary | ICD-10-CM

## 2023-02-18 DIAGNOSIS — H25813 Combined forms of age-related cataract, bilateral: Secondary | ICD-10-CM

## 2023-02-18 DIAGNOSIS — I10 Essential (primary) hypertension: Secondary | ICD-10-CM | POA: Diagnosis not present

## 2023-02-18 DIAGNOSIS — H34231 Retinal artery branch occlusion, right eye: Secondary | ICD-10-CM

## 2023-02-18 MED ORDER — BEVACIZUMAB CHEMO INJECTION 1.25MG/0.05ML SYRINGE FOR KALEIDOSCOPE
1.2500 mg | INTRAVITREAL | Status: AC | PRN
  Administered 2023-02-18: 1.25 mg via INTRAVITREAL

## 2023-02-25 ENCOUNTER — Telehealth: Payer: Self-pay | Admitting: Cardiology

## 2023-02-25 NOTE — Telephone Encounter (Signed)
Reviewed results with patients son michael - okay per DPR  Heart monitor showed normal rhythm with first degree block and 24 runs of a fast heart rate. No high grade heart block. Overall heart monitor is reassuring.

## 2023-02-25 NOTE — Telephone Encounter (Signed)
Son called to follow-up on patient's heart monitor results.

## 2023-02-28 ENCOUNTER — Telehealth: Payer: Self-pay | Admitting: Cardiology

## 2023-02-28 NOTE — Telephone Encounter (Signed)
Attempted to contact Sam from [facility]. Was informed Sam was on another line and would call our office back.

## 2023-02-28 NOTE — Telephone Encounter (Signed)
Sam with [facility] is calling to report additional findings found on the patient's heart monitor results. The reference # for callback is [id number]. Please advise.

## 2023-02-28 NOTE — Telephone Encounter (Signed)
Sam from [facility] called to report monitor showed 3 second pause on 01/30/23 at 1:54 am. She indicated results will be uploaded for review.   Will forward to ordering PA

## 2023-03-10 NOTE — Telephone Encounter (Signed)
Pt's son made aware of results along with MD's recommendations Son verbalized understanding.   Results did not come to my in basket.  Okay to result as below. Thank you  Sinus pauses, first-degree AV block, Mobitz 1 AV block noted. Sinus pauses associated with sleep. No significant conduction defects to suggest etiology for syncope. Continue to avoid AV nodal agents.

## 2023-03-14 ENCOUNTER — Other Ambulatory Visit: Payer: Self-pay

## 2023-03-14 ENCOUNTER — Ambulatory Visit: Payer: Medicare PPO | Attending: Cardiology | Admitting: Cardiology

## 2023-03-14 ENCOUNTER — Encounter: Payer: Self-pay | Admitting: Cardiology

## 2023-03-14 VITALS — BP 132/70 | HR 48 | Ht 62.0 in | Wt 256.0 lb

## 2023-03-14 DIAGNOSIS — R001 Bradycardia, unspecified: Secondary | ICD-10-CM

## 2023-03-14 DIAGNOSIS — I10 Essential (primary) hypertension: Secondary | ICD-10-CM

## 2023-03-14 MED ORDER — SEMAGLUTIDE-WEIGHT MANAGEMENT 0.25 MG/0.5ML ~~LOC~~ SOAJ
0.2500 mg | SUBCUTANEOUS | 0 refills | Status: DC
  Filled 2023-03-14: qty 2, 28d supply, fill #0

## 2023-03-14 MED ORDER — SEMAGLUTIDE-WEIGHT MANAGEMENT 1.7 MG/0.75ML ~~LOC~~ SOAJ
1.7000 mg | SUBCUTANEOUS | 0 refills | Status: DC
  Filled 2023-03-14: qty 3, 28d supply, fill #0

## 2023-03-14 MED ORDER — SEMAGLUTIDE-WEIGHT MANAGEMENT 0.5 MG/0.5ML ~~LOC~~ SOAJ
0.5000 mg | SUBCUTANEOUS | 0 refills | Status: DC
  Filled 2023-03-14: qty 2, 28d supply, fill #0

## 2023-03-14 MED ORDER — LOSARTAN POTASSIUM 50 MG PO TABS: 50.0000 mg | ORAL_TABLET | Freq: Every day | ORAL | 3 refills | Status: DC

## 2023-03-14 MED ORDER — SEMAGLUTIDE-WEIGHT MANAGEMENT 2.4 MG/0.75ML ~~LOC~~ SOAJ
2.4000 mg | SUBCUTANEOUS | 3 refills | Status: DC
  Filled 2023-03-14: qty 3, fill #0

## 2023-03-14 MED ORDER — SEMAGLUTIDE-WEIGHT MANAGEMENT 1 MG/0.5ML ~~LOC~~ SOAJ
1.0000 mg | SUBCUTANEOUS | 0 refills | Status: AC
  Filled 2023-03-14 – 2023-03-18 (×2): qty 2, 28d supply, fill #0

## 2023-03-14 NOTE — Progress Notes (Signed)
Cardiology Office Note:    Date:  03/14/2023   ID:  Shannan Harper, DOB 01-25-1941, MRN [id number]  PCP:  Katherina Right, MD   [facility]  Cardiologist:  Debbe Odea, MD  Advanced Practice Provider:  No care team member to display Electrophysiologist:  None       Referring MD: Katherina Right, MD   Chief Complaint  Patient presents with   Follow-up    Discuss Zio results.  Patient denies new or acute cardiac problems/concerns today.  Medications reviewed with MAR from [facility].    History of Present Illness:    Shelley Mitchell is a 82 y.o. female with a hx of hypertension, hyperlipidemia, bradycardia presenting for follow-up.  Patient recently seen in the hospital due to dizziness and bradycardia.  Clonidine was stopped, cardiac monitor x 4 weeks was placed to evaluate any high degree AV block.  Patient evaluated by EP, no findings to suggest pacemaker indication.  Cardiac monitor showed average heart rate heart rates in the 50s, no high degree AV block noted.  Echocardiogram 12/2022 EF 60 to 65%, aortic valve sclerosis, impaired relaxation.  Patient ambulates with a wheelchair, currently in a skilled nursing facility/rehab to help with mobility, coordination.  BP have been elevated of late, systolic sometimes in the 160s to 180s.   Past Medical History:  Diagnosis Date   Endometrial polyp    Dr. Despina Hidden   History of endometrial hyperplasia    HTN (hypertension)    Hx of adenomatous colonic polyps 1999   Hyperlipidemia    Mental disorder    GAD   Obesity    Osteoarthritis    Tachycardia    subsequently with bradycardia (?secondary to meds)    Past Surgical History:  Procedure Laterality Date   BACK SURGERY     bilateral knee replacements  2002/2003   CHOLECYSTECTOMY     COLONOSCOPY  03/2002   Dr. Fleet Contras internal hemorrhoids   COLONOSCOPY  06/21/2011   polyp at IC valve (tubular adenoma)   COLONOSCOPY N/A 07/15/2016   one  8 mm polyp at hepatic flexure (tubular adenoma), on 12 mm polyp at hepatic flexure (tubular adenoma), 3 year surveillance if health permits   COLONOSCOPY N/A 10/10/2019   descending colon diverticulosis, two 4-5 mm polyps at splenic flexure (tubular adenomas). No surveillance due to age.    LUMBAR LAMINECTOMY/DECOMPRESSION MICRODISCECTOMY N/A 04/27/2022   Procedure: Thoracic Eleven-Twelve Decompression;  Surgeon: Coletta Memos, MD;  Location: [facility] OR;  Service: Neurosurgery;  Laterality: N/A;   POLYPECTOMY  07/15/2016   Procedure: POLYPECTOMY;  Surgeon: Corbin Ade, MD;  Location: [facility];  Service: Endoscopy;;  Splenic Flexure polyps x 2 removed via hot snare   POLYPECTOMY  10/10/2019   Procedure: POLYPECTOMY;  Surgeon: Corbin Ade, MD;  Location: [facility];  Service: Endoscopy;;   TUBAL LIGATION      Current Medications: Current Meds  Medication Sig   acetaminophen (TYLENOL) 500 MG tablet Take 1,000 mg by mouth every 12 (twelve) hours.   albuterol (VENTOLIN HFA) 108 (90 Base) MCG/ACT inhaler Inhale 2 puffs into the lungs every 8 (eight) hours as needed for wheezing or shortness of breath.   Amino Acids-Protein Hydrolys (PRO-STAT AWC) LIQD Take 30 mLs by mouth 2 (two) times daily between meals.   amLODipine (NORVASC) 5 MG tablet Take 1 tablet (5 mg total) by mouth daily.   aspirin EC 81 MG tablet Take 81 mg by mouth daily.  Swallow whole.   furosemide (LASIX) 20 MG tablet Take 20 mg by mouth daily.   hydrALAZINE (APRESOLINE) 25 MG tablet Take 25 mg by mouth daily.   latanoprost (XALATAN) 0.005 % ophthalmic solution Place 1 drop into both eyes at bedtime.   levothyroxine (SYNTHROID) 25 MCG tablet Take 25 mcg by mouth daily before breakfast.   lubiprostone (AMITIZA) 8 MCG capsule Take 1 capsule (8 mcg total) by mouth daily with breakfast.   medroxyPROGESTERone (PROVERA) 10 MG tablet TAKE 1 TABLET BY MOUTH DAILY 10 DAYS PER MONTH (Patient taking differently: Take 10 mg by mouth See  admin instructions. Take 10 mg once daily on the 1st-10th day of each month)   Multiple Vitamins-Minerals (MULTIVITAMIN WITH MINERALS) tablet Take 1 tablet by mouth daily.   nystatin powder Apply 1 Application topically 3 (three) times daily.   polyethylene glycol powder (GLYCOLAX/MIRALAX) 17 GM/SCOOP powder Take 17 g by mouth daily.   pravastatin (PRAVACHOL) 40 MG tablet Take 40 mg by mouth at bedtime.   Semaglutide-Weight Management 0.25 MG/0.5ML SOAJ Inject 0.25 mg into the skin once a week for 28 days.   [START ON 04/12/2023] Semaglutide-Weight Management 0.5 MG/0.5ML SOAJ Inject 0.5 mg into the skin once a week for 28 days.   [START ON 05/11/2023] Semaglutide-Weight Management 1 MG/0.5ML SOAJ Inject 1 mg into the skin once a week for 28 days.   [START ON 06/09/2023] Semaglutide-Weight Management 1.7 MG/0.75ML SOAJ Inject 1.7 mg into the skin once a week for 28 days.   [START ON 07/08/2023] Semaglutide-Weight Management 2.4 MG/0.75ML SOAJ Inject 2.4 mg into the skin once a week.   sertraline (ZOLOFT) 100 MG tablet Take 100 mg by mouth daily.   [DISCONTINUED] losartan (COZAAR) 25 MG tablet Take 1 tablet (25 mg total) by mouth daily.     Allergies:   Ace inhibitors and Nsaids   Social History   Socioeconomic History   Marital status: Married    Spouse name: Not on file   Number of children: 4   Years of education: Not on file   Highest education level: Not on file  Occupational History    Employer: RETIRED  Tobacco Use   Smoking status: Never   Smokeless tobacco: Never  Vaping Use   Vaping Use: Never used  Substance and Sexual Activity   Alcohol use: No    Alcohol/week: 0.0 standard drinks of alcohol   Drug use: No   Sexual activity: Not Currently    Birth control/protection: Post-menopausal  Other Topics Concern   Not on file  Social History Narrative   Not on file   Social Determinants of Health   Financial Resource Strain: Not on file  Food Insecurity: No Food Insecurity  (12/26/2022)   Hunger Vital Sign    Worried About Running Out of Food in the Last Year: Never true    Ran Out of Food in the Last Year: Never true  Transportation Needs: No Transportation Needs (12/26/2022)   PRAPARE - Administrator, Civil Service (Medical): No    Lack of Transportation (Non-Medical): No  Physical Activity: Not on file  Stress: Not on file  Social Connections: Not on file     Family History: The patient's family history includes Cancer in her father; Colon polyps in her brother; Diabetes in her brother and sister; Stroke (age of onset: 65) in her mother. There is no history of Colon cancer.  ROS:   Please see the history of present illness.  All other systems reviewed and are negative.  EKGs/Labs/Other Studies Reviewed:    The following studies were reviewed today:   EKG:  EKG not  ordered today.    Recent Labs: 04/30/2022: ALT 55 12/23/2022: TSH 2.994 12/26/2022: Magnesium 2.0 12/27/2022: Hemoglobin 11.2; Platelets 187 01/10/2023: BUN 18; Creatinine, Ser 0.57; Potassium 4.0; Sodium 137  Recent Lipid Panel    Component Value Date/Time   CHOL 160 04/24/2022 0341   TRIG 83 04/24/2022 0341   HDL 67 04/24/2022 0341   CHOLHDL 2.4 04/24/2022 0341   VLDL 17 04/24/2022 0341   LDLCALC 76 04/24/2022 0341     Risk Assessment/Calculations:      Physical Exam:    VS:  BP 132/70 (BP Location: Left Arm, Patient Position: Sitting)   Pulse (!) 48   Ht 5\' 2"  (1.575 m)   Wt 256 lb (116.1 kg) Comment: pt reported from [facility]  SpO2 97%   BMI 46.82 kg/m     Wt Readings from Last 3 Encounters:  03/14/23 256 lb (116.1 kg)  01/10/23 249 lb 14.4 oz (113.4 kg)  12/27/22 249 lb 9 oz (113.2 kg)     GEN:  Well nourished, well developed in no acute distress HEENT: Normal NECK: No JVD; No carotid bruits CARDIAC: Bradycardic, regular, 2/6 systolic murmur RESPIRATORY:  Clear to auscultation without rales, wheezing or rhonchi  ABDOMEN: Soft,  non-tender, non-distended MUSCULOSKELETAL:  1+ edema; No deformity  SKIN: Warm and dry NEUROLOGIC:  Alert and oriented x 3 PSYCHIATRIC:  Normal affect   ASSESSMENT:    1. Bradycardia   2. Primary hypertension   3. Morbid obesity    PLAN:    In order of problems listed above:  Bradycardia, dizziness.  Cardiac monitor with no findings to suggest etiology of dizziness, no high degree AV block noted.  Average heart rate 50s.  Continue to avoid AV nodal agents, clonidine.  Patient ambulates with a wheelchair. Hypertension, BP elevated.  Increase losartan to 50 mg daily, continue Norvasc 5 mg daily. Morbid obesity,  Follow-up annually.   Medication Adjustments/Labs and Tests Ordered: Current medicines are reviewed at length with the patient today.  Concerns regarding medicines are outlined above.  No orders of the defined types were placed in this encounter.  Meds ordered this encounter  Medications   losartan (COZAAR) 50 MG tablet    Sig: Take 1 tablet (50 mg total) by mouth daily.    Dispense:  90 tablet    Refill:  3   Semaglutide-Weight Management 0.25 MG/0.5ML SOAJ    Sig: Inject 0.25 mg into the skin once a week for 28 days.    Dispense:  2 mL    Refill:  0    Please call [phone] when ready for pick up.   Semaglutide-Weight Management 0.5 MG/0.5ML SOAJ    Sig: Inject 0.5 mg into the skin once a week for 28 days.    Dispense:  2 mL    Refill:  0   Semaglutide-Weight Management 1 MG/0.5ML SOAJ    Sig: Inject 1 mg into the skin once a week for 28 days.    Dispense:  2 mL    Refill:  0   Semaglutide-Weight Management 1.7 MG/0.75ML SOAJ    Sig: Inject 1.7 mg into the skin once a week for 28 days.    Dispense:  3 mL    Refill:  0   Semaglutide-Weight Management 2.4 MG/0.75ML SOAJ    Sig: Inject 2.4 mg into  the skin once a week.    Dispense:  3 mL    Refill:  3    Patient Instructions  Medication Instructions:   INCREASE Losartan - take one tablet ( 50 mg) by  mouth daily.   Start taking Wegovy Month 1: 0.25 mg once a week. Month 2: 0.5 mg once a week. (Call or send [place] a MyChart message when you are on week 2, so we can send your next dose in for you). Month 3: 1 mg once a week. Month 4: 1.7 mg once a week. Month 5 and beyond: 2.4 mg once a week (maintenance dose)  *If you need a refill on your cardiac medications before your next appointment, please call your pharmacy*   Lab Work:  None Ordered  If you have labs (blood work) drawn today and your tests are completely normal, you will receive your results only by: MyChart Message (if you have MyChart) OR A paper copy in the mail If you have any lab test that is abnormal or we need to change your treatment, we will call you to review the results.   Testing/Procedures:  None Ordered    Follow-Up: At [facility], you and your health needs are our priority.  As part of our continuing mission to provide you with exceptional heart care, we have created designated Provider Care Teams.  These Care Teams include your primary Cardiologist (physician) and Advanced Practice Providers (APPs -  Physician Assistants and Nurse Practitioners) who all work together to provide you with the care you need, when you need it.  We recommend signing up for the patient portal called "MyChart".  Sign up information is provided on this After Visit Summary.  MyChart is used to connect with patients for Virtual Visits (Telemedicine).  Patients are able to view lab/test results, encounter notes, upcoming appointments, etc.  Non-urgent messages can be sent to your provider as well.   To learn more about what you can do with MyChart, go to [email or web address].    Your next appointment:   3 month(s)  Provider:   You may see Debbe Odea, MD or one of the following Advanced Practice Providers on your designated Care Team:   Nicolasa Ducking, NP Eula Listen, PA-C Cadence Fransico Michael, PA-C Charlsie Quest,  NP   Signed, Debbe Odea, MD  03/14/2023 3:44 PM    [facility]

## 2023-03-14 NOTE — Patient Instructions (Signed)
Medication Instructions:   INCREASE Losartan - take one tablet ( 50 mg) by mouth daily.   Start taking Wegovy Month 1: 0.25 mg once a week. Month 2: 0.5 mg once a week. (Call or send [place] a MyChart message when you are on week 2, so we can send your next dose in for you). Month 3: 1 mg once a week. Month 4: 1.7 mg once a week. Month 5 and beyond: 2.4 mg once a week (maintenance dose)  *If you need a refill on your cardiac medications before your next appointment, please call your pharmacy*   Lab Work:  None Ordered  If you have labs (blood work) drawn today and your tests are completely normal, you will receive your results only by: MyChart Message (if you have MyChart) OR A paper copy in the mail If you have any lab test that is abnormal or we need to change your treatment, we will call you to review the results.   Testing/Procedures:  None Ordered    Follow-Up: At [facility], you and your health needs are our priority.  As part of our continuing mission to provide you with exceptional heart care, we have created designated Provider Care Teams.  These Care Teams include your primary Cardiologist (physician) and Advanced Practice Providers (APPs -  Physician Assistants and Nurse Practitioners) who all work together to provide you with the care you need, when you need it.  We recommend signing up for the patient portal called "MyChart".  Sign up information is provided on this After Visit Summary.  MyChart is used to connect with patients for Virtual Visits (Telemedicine).  Patients are able to view lab/test results, encounter notes, upcoming appointments, etc.  Non-urgent messages can be sent to your provider as well.   To learn more about what you can do with MyChart, go to [email or web address].    Your next appointment:   3 month(s)  Provider:   You may see Debbe Odea, MD or one of the following Advanced Practice Providers on your designated Care Team:    Nicolasa Ducking, NP Eula Listen, PA-C Cadence Fransico Michael, PA-C Charlsie Quest, NP

## 2023-03-15 ENCOUNTER — Other Ambulatory Visit: Payer: Self-pay

## 2023-03-15 ENCOUNTER — Telehealth: Payer: Self-pay | Admitting: Cardiology

## 2023-03-15 MED ORDER — SEMAGLUTIDE-WEIGHT MANAGEMENT 0.25 MG/0.5ML ~~LOC~~ SOAJ: 0.2500 mg | SUBCUTANEOUS | 0 refills | Status: DC

## 2023-03-15 MED ORDER — SEMAGLUTIDE-WEIGHT MANAGEMENT 0.5 MG/0.5ML ~~LOC~~ SOAJ: 0.5000 mg | SUBCUTANEOUS | 0 refills | Status: DC

## 2023-03-15 NOTE — Telephone Encounter (Signed)
*  STAT* If patient is at the pharmacy, call can be transferred to refill team.   1. Which medications need to be refilled? (please list name of each medication and dose if known)    Semaglutide-Weight Management 0.25 MG/0.5ML SOAJ Inject 0.25 mg into the skin once a week for 28 days.    Semaglutide-Weight Management 0.5 MG/0.5ML SOAJ Inject 0.5 mg into the skin once a week for 28 days.   Semaglutide-Weight Management 1 MG/0.5ML SOAJ Inject 1 mg into the skin once a week for 28 days.   Semaglutide-Weight Management 1.7 MG/0.75ML SOAJ Inject 1.7 mg into the skin once a week for 28 days.   Semaglutide-Weight Management 2.4 MG/0.75ML SOAJ Inject 2.4 mg into the skin once a week.    2. Which pharmacy/location (including street and city if local pharmacy) is medication to be sent to?  [facility] - [place], [place] - [address] [place] Phone: [phone]  Fax: [phone]       3. Do they need a 30 day or 90 day supply? 90    Pt is in a rehab facility and unable to pick up at [facility], facility ask that it be sent to their pharmacy above

## 2023-03-15 NOTE — Telephone Encounter (Signed)
Medication resent   Disp Refills Start End   Semaglutide-Weight Management 0.25 MG/0.5ML SOAJ 2 mL 0 03/15/2023 04/12/2023   Sig - Route: Inject 0.25 mg into the skin once a week for 28 days. - Subcutaneous   Sent to pharmacy as: Semaglutide-Weight Management 0.25 MG/0.5ML Solution Auto-injector   E-Prescribing Status: Sent to pharmacy (03/15/2023 10:48 AM EDT)    Pharmacy  [facility] - [place], [place] - [address]

## 2023-03-16 NOTE — Progress Notes (Signed)
[facility] - Clinic Note  03/18/2023     CHIEF COMPLAINT Patient presents for Retina Follow Up   HISTORY OF PRESENT ILLNESS: Shelley Mitchell is a 82 y.o. female who presents to the clinic today for:   HPI     Retina Follow Up   Patient presents with  CRVO/BRVO.  In left eye.  This started 4 weeks ago.  I, the attending physician,  performed the HPI with the patient and updated documentation appropriately.        Comments   Patient here for 4 weeks retina follow up for BRVO S. Patient state vision about the same. Don't see the little dots. Only once in a while. Not like used to be.      Last edited by Rennis Chris, MD on 03/18/2023 12:05 PM.      Referring physician: Katherina Right, MD [address] [place],  [place] [address]  HISTORICAL INFORMATION:   Selected notes from the MEDICAL RECORD NUMBER Referred by Dr. Elmer Picker for concern of BDR / PDR LEE:  Ocular Hx- PMH-    CURRENT MEDICATIONS: Current Outpatient Medications (Ophthalmic Drugs)  Medication Sig   latanoprost (XALATAN) 0.005 % ophthalmic solution Place 1 drop into both eyes at bedtime.   No current facility-administered medications for this visit. (Ophthalmic Drugs)   Current Outpatient Medications (Other)  Medication Sig   acetaminophen (TYLENOL) 500 MG tablet Take 1,000 mg by mouth every 12 (twelve) hours.   albuterol (VENTOLIN HFA) 108 (90 Base) MCG/ACT inhaler Inhale 2 puffs into the lungs every 8 (eight) hours as needed for wheezing or shortness of breath.   Amino Acids-Protein Hydrolys (PRO-STAT AWC) LIQD Take 30 mLs by mouth 2 (two) times daily between meals.   amLODipine (NORVASC) 5 MG tablet Take 1 tablet (5 mg total) by mouth daily.   aspirin EC 81 MG tablet Take 81 mg by mouth daily. Swallow whole.   furosemide (LASIX) 20 MG tablet Take 20 mg by mouth daily.   hydrALAZINE (APRESOLINE) 25 MG tablet Take 25 mg by mouth daily.   levothyroxine (SYNTHROID) 25 MCG tablet Take 25  mcg by mouth daily before breakfast.   losartan (COZAAR) 50 MG tablet Take 1 tablet (50 mg total) by mouth daily.   lubiprostone (AMITIZA) 8 MCG capsule Take 1 capsule (8 mcg total) by mouth daily with breakfast.   medroxyPROGESTERone (PROVERA) 10 MG tablet TAKE 1 TABLET BY MOUTH DAILY 10 DAYS PER MONTH (Patient taking differently: Take 10 mg by mouth See admin instructions. Take 10 mg once daily on the 1st-10th day of each month)   Multiple Vitamins-Minerals (MULTIVITAMIN WITH MINERALS) tablet Take 1 tablet by mouth daily.   nystatin powder Apply 1 Application topically 3 (three) times daily.   polyethylene glycol powder (GLYCOLAX/MIRALAX) 17 GM/SCOOP powder Take 17 g by mouth daily.   pravastatin (PRAVACHOL) 40 MG tablet Take 40 mg by mouth at bedtime.   Semaglutide-Weight Management 0.25 MG/0.5ML SOAJ Inject 0.25 mg into the skin once a week for 28 days.   [START ON 04/12/2023] Semaglutide-Weight Management 0.5 MG/0.5ML SOAJ Inject 0.5 mg into the skin once a week for 28 days.   [START ON 05/11/2023] Semaglutide-Weight Management 1 MG/0.5ML SOAJ Inject 1 mg into the skin once a week for 28 days.   [START ON 06/09/2023] Semaglutide-Weight Management 1.7 MG/0.75ML SOAJ Inject 1.7 mg into the skin once a week for 28 days.   [START ON 07/08/2023] Semaglutide-Weight Management 2.4 MG/0.75ML SOAJ Inject 2.4  mg into the skin once a week.   sertraline (ZOLOFT) 100 MG tablet Take 100 mg by mouth daily.   No current facility-administered medications for this visit. (Other)   REVIEW OF SYSTEMS: ROS   Positive for: Musculoskeletal, Cardiovascular, Eyes, Respiratory Last edited by Laddie Aquas, COA on 03/18/2023  9:51 AM.       ALLERGIES Allergies  Allergen Reactions   Ace Inhibitors Itching   Nsaids Itching   PAST MEDICAL HISTORY Past Medical History:  Diagnosis Date   Endometrial polyp    Dr. Despina Hidden   History of endometrial hyperplasia    HTN (hypertension)    Hx of adenomatous colonic polyps  1999   Hyperlipidemia    Mental disorder    GAD   Obesity    Osteoarthritis    Tachycardia    subsequently with bradycardia (?secondary to meds)   Past Surgical History:  Procedure Laterality Date   BACK SURGERY     bilateral knee replacements  2002/2003   CHOLECYSTECTOMY     COLONOSCOPY  03/2002   Dr. Fleet Contras internal hemorrhoids   COLONOSCOPY  06/21/2011   polyp at IC valve (tubular adenoma)   COLONOSCOPY N/A 07/15/2016   one 8 mm polyp at hepatic flexure (tubular adenoma), on 12 mm polyp at hepatic flexure (tubular adenoma), 3 year surveillance if health permits   COLONOSCOPY N/A 10/10/2019   descending colon diverticulosis, two 4-5 mm polyps at splenic flexure (tubular adenomas). No surveillance due to age.    LUMBAR LAMINECTOMY/DECOMPRESSION MICRODISCECTOMY N/A 04/27/2022   Procedure: Thoracic Eleven-Twelve Decompression;  Surgeon: Coletta Memos, MD;  Location: [facility] OR;  Service: Neurosurgery;  Laterality: N/A;   POLYPECTOMY  07/15/2016   Procedure: POLYPECTOMY;  Surgeon: Corbin Ade, MD;  Location: [facility];  Service: Endoscopy;;  Splenic Flexure polyps x 2 removed via hot snare   POLYPECTOMY  10/10/2019   Procedure: POLYPECTOMY;  Surgeon: Corbin Ade, MD;  Location: [facility];  Service: Endoscopy;;   TUBAL LIGATION     FAMILY HISTORY Family History  Problem Relation Age of Onset   Diabetes Brother    Colon polyps Brother    Diabetes Sister    Cancer Father        Likely prostate   Stroke Mother 71   Colon cancer Neg Hx    SOCIAL HISTORY Social History   Tobacco Use   Smoking status: Never   Smokeless tobacco: Never  Vaping Use   Vaping Use: Never used  Substance Use Topics   Alcohol use: No    Alcohol/week: 0.0 standard drinks of alcohol   Drug use: No       OPHTHALMIC EXAM:  Base Eye Exam     Visual Acuity (Snellen - Linear)       Right Left   Dist [place] 20/30 -2 20/100 -1   Dist ph [place] 20/25 -2 20/80 -2         Tonometry  (Tonopen, 9:48 AM)       Right Left   Pressure 14 11         Pupils       Dark Light Shape React APD   Right 3 2 Round Minimal None   Left 3 2 Round Minimal None         Visual Fields (Counting fingers)       Left Right    Full Full         Extraocular Movement  Right Left    Full, Ortho Full, Ortho         Neuro/Psych     Oriented x3: Yes   Mood/Affect: Normal         Dilation     Both eyes: 1.0% Mydriacyl, 2.5% Phenylephrine @ 9:48 AM           Slit Lamp and Fundus Exam     Slit Lamp Exam       Right Left   Lids/Lashes Dermatochalasis - upper lid Dermatochalasis - upper lid   Conjunctiva/Sclera mild melanosis mild melanosis   Cornea arcus arcus   Anterior Chamber deep and clear deep and clear   Iris Round and dilated Round and dilated   Lens 2+ Nuclear sclerosis, 2+ Cortical cataract 2-3+ Nuclear sclerosis, 2-3+ Cortical cataract   Anterior Vitreous Vitreous syneresis, Posterior vitreous detachment, vitreous condensations Vitreous syneresis, Posterior vitreous detachment         Fundus Exam       Right Left   Disc nasal hyperemia -- improved, +heme, sharp rim, temporal elevation, PPA, mild temporal pallor, peripapillary subretinal heme superior and nasal to disc -- resolved Pink and Sharp, mild tilt, +PPA   C/D Ratio 0.3 0.3   Macula Flat, good foveal reflex, inferior macula atrophic, RPE mottling and clumping, No heme Blunted foveal reflex, central and inferior IRH and exudates -- improved, central cystic changes -- improved, retinal macroanuerysms inferior macula -- improving, scattered DBH -- improved, punctate exudates centrally   Vessels attenuated, Tortuous attenuated, Tortuous, retinal macroaneurysms along IT arcades   Periphery Attached, peripapillary subretinal heme superior and nasal to disc -- improved, peripheral subretinal heme and exudates temporal periphery -- improved Attached, scattered IRH           Refraction      Wearing Rx       Sphere Cylinder Axis Add   Right -2.00 +0.75 165 +2.00   Left -1.25 +0.50 148 +2.00           IMAGING AND PROCEDURES  Imaging and Procedures for 03/18/2023  OCT, Retina - OU - Both Eyes       Right Eye Quality was borderline. Central Foveal Thickness: 234. Progression has been stable. Findings include normal foveal contour, no IRF, no SRF, myopic contour, intraretinal hyper-reflective material, subretinal fluid, inner retinal atrophy (Diffuse IRA inferior macula / hemisphere -- ?remote BRAO; stable improvement in disc edema).   Left Eye Quality was good. Central Foveal Thickness: 277. Progression has improved. Findings include abnormal foveal contour, myopic contour, retinal drusen , subretinal hyper-reflective material, intraretinal hyper-reflective material, intraretinal fluid, outer retinal atrophy (Elevation of disc, central ORA, drusen, SRHM, persistent IRF/IRHM temporal macula -- slightly improved).   Notes *Images captured and stored on drive  Diagnosis / Impression:  OD: Diffuse IRA inferior macula / hemisphere -- ?remote BRAO; stable improvement in disc edema, OS: Elevation of disc, central ORA, drusen, SRHM, persistent IRF/IRHM temporal macula -- slightly improved  Clinical management:  See below  Abbreviations: NFP - Normal foveal profile. CME - cystoid macular edema. PED - pigment epithelial detachment. IRF - intraretinal fluid. SRF - subretinal fluid. EZ - ellipsoid zone. ERM - epiretinal membrane. ORA - outer retinal atrophy. ORT - outer retinal tubulation. SRHM - subretinal hyper-reflective material. IRHM - intraretinal hyper-reflective material      Intravitreal Injection, Pharmacologic Agent - OS - Left Eye       Time Out 03/18/2023. 10:39 AM. Confirmed correct patient, procedure, site, and patient consented.  Anesthesia Topical anesthesia was used. Anesthetic medications included Lidocaine 2%, Proparacaine 0.5%.    Procedure Preparation included 5% betadine to ocular surface, eyelid speculum. A supplied (32g) needle was used.   Injection: 1.25 mg Bevacizumab 1.25mg /0.28ml   Route: Intravitreal, Site: Left Eye   NDC: [id number], Lot: [id number], Expiration date: 04/23/2023   Post-op Post injection exam found visual acuity of at least counting fingers. The patient tolerated the procedure well. There were no complications. The patient received written and verbal post procedure care education. Post injection medications were not given.             ASSESSMENT/PLAN:    ICD-10-CM   1. 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 Essential hypertension  I10     5. Hypertensive retinopathy of both eyes  H35.033     6. Combined forms of age-related cataract of both eyes  H25.813       1,2. BRVO w/ CME and retinal macronauerysm OS  - s/p IVA OS #1 (12.15.23) #2 (01.12.24), #3 (02.09.24) #4(03.15.2024) - pt previously followed with Dr. Fayrene Fearing in [place], [place], but now lives in a facility in [place] [facility] [place] and cannot be transported to [facility] - history of laser photoablation of retinal macroaneurysm OS per chart review - FA 12.15.23 shows focal MAs w/ late leakage - BCVA OS 20/80- stable - exam shows macroaneurysm with surrounding circinate exudates and edema, inferior macula OS -- improving - OCT shows elevation of disc, central ORA, drusen, SRHM, persistent IRF/IRHM temporal macula -- slightly improved - recommend IVA OS #5 (04.12 24) - RBA of procedure discussed, questions answered - IVA informed consent obtained and signed, 12.15.23 (OS) - see procedure note - will check auth for Eylea - F/U 4 weeks -- DFE/OCT/possible injection  3.  BRAO OD - OCT shows Diffuse IRA inferior macula / hemisphere -- ?remote BRAO; stable improvement in disc edema, trace cystic changes SN macula -- improved from prior  - exam shows peripapillary hemorrhage, mild NVD nasal disc -- improving  - FA 12.15.23 shows mild NVD nasal disc w/ leakage  - pt may benefit from PRP and anti-VEGF therapy  4,5. Hypertensive retinopathy OU - discussed importance of tight BP control - continue to monitor  6. Mixed Cataract OU - The symptoms of cataract, surgical options, and treatments and risks were discussed with patient. - discussed diagnosis and progression - likely visually significant - under the expert management of Dr. Elmer Picker - clear from a retina standpoint to proceed with cataract surgery when pt and surgeon are ready  - would recommend timing cataract surgery ~1-2 wks after anti-VEGF injection  Ophthalmic Meds Ordered this visit:  Meds ordered this encounter  Medications   Bevacizumab (AVASTIN) SOLN 1.25 mg     Return in about 4 weeks (around 04/15/2023) for f/u BRVO OS, DFE, FA.  There are no Patient Instructions on file for this visit.  This document serves as a record of services personally performed by Karie Chimera, MD, PhD. It was created on their behalf by Berlin Hun COT, an ophthalmic technician. The creation of this record is the provider's dictation and/or activities during the visit.  Electronically signed by: Berlin Hun COT 03/16/2023 12:07 PM  This document serves as a record of services personally performed by Karie Chimera, MD, PhD. It was created on their behalf by Glee Arvin. Manson Passey, OA an ophthalmic technician. The creation of this record is the provider's dictation and/or activities during the visit.    Electronically signed by: Glee Arvin. Kristopher Oppenheim 04.12.2024 12:07 PM  Karie Chimera, M.D., Ph.D. Diseases & Surgery of the Retina and Vitreous [facility] 03/18/2023   I have  reviewed the above documentation for accuracy and completeness, and I agree with the above. Karie Chimera, M.D., Ph.D. 03/18/23 12:08 PM   Abbreviations: M myopia (nearsighted); A astigmatism; H hyperopia (farsighted); P presbyopia; Mrx spectacle prescription;  CTL contact lenses; OD right eye; OS left eye; OU both eyes  XT exotropia; ET esotropia; PEK punctate epithelial keratitis; PEE punctate epithelial erosions; DES dry eye syndrome; MGD meibomian gland dysfunction; ATs artificial tears; PFAT's preservative free artificial tears; NSC nuclear sclerotic cataract; PSC posterior subcapsular cataract; ERM epi-retinal membrane; PVD posterior vitreous detachment; RD retinal detachment; DM diabetes mellitus; DR diabetic retinopathy; NPDR non-proliferative diabetic retinopathy; PDR proliferative diabetic retinopathy; CSME clinically significant macular edema; DME diabetic macular edema; dbh dot blot hemorrhages; CWS cotton wool spot; POAG primary open angle glaucoma; C/D cup-to-disc ratio; HVF humphrey visual field; GVF goldmann visual field; OCT optical coherence tomography; IOP intraocular pressure; BRVO Branch retinal vein occlusion; CRVO central retinal vein occlusion; CRAO central retinal artery occlusion; BRAO branch retinal artery occlusion; RT retinal tear; SB scleral buckle; PPV pars plana vitrectomy; VH Vitreous hemorrhage; PRP panretinal laser photocoagulation; IVK intravitreal kenalog; VMT vitreomacular traction; MH Macular hole;  NVD neovascularization of the disc; NVE neovascularization elsewhere; AREDS age related eye disease study; ARMD age related macular degeneration; POAG primary open angle glaucoma; EBMD epithelial/anterior basement membrane dystrophy; ACIOL anterior chamber intraocular lens; IOL intraocular lens; PCIOL posterior chamber intraocular lens; Phaco/IOL phacoemulsification with intraocular lens placement; PRK photorefractive keratectomy; LASIK laser assisted in situ keratomileusis;  HTN hypertension; DM diabetes mellitus; COPD chronic obstructive pulmonary disease

## 2023-03-17 NOTE — Telephone Encounter (Signed)
Raven, from [facility], is calling to get clarification as to the Prior Auth. She states she tried submitting one, but was denied due to another PA for this already being submitted. Please advise.

## 2023-03-18 ENCOUNTER — Ambulatory Visit (INDEPENDENT_AMBULATORY_CARE_PROVIDER_SITE_OTHER): Payer: Medicare PPO | Admitting: Ophthalmology

## 2023-03-18 ENCOUNTER — Encounter (INDEPENDENT_AMBULATORY_CARE_PROVIDER_SITE_OTHER): Payer: Self-pay | Admitting: Ophthalmology

## 2023-03-18 ENCOUNTER — Other Ambulatory Visit: Payer: Self-pay

## 2023-03-18 ENCOUNTER — Telehealth: Payer: Self-pay

## 2023-03-18 DIAGNOSIS — H34231 Retinal artery branch occlusion, right eye: Secondary | ICD-10-CM | POA: Diagnosis not present

## 2023-03-18 DIAGNOSIS — H34832 Tributary (branch) retinal vein occlusion, left eye, with macular edema: Secondary | ICD-10-CM

## 2023-03-18 DIAGNOSIS — H25813 Combined forms of age-related cataract, bilateral: Secondary | ICD-10-CM

## 2023-03-18 DIAGNOSIS — H3509 Other intraretinal microvascular abnormalities: Secondary | ICD-10-CM

## 2023-03-18 DIAGNOSIS — H35033 Hypertensive retinopathy, bilateral: Secondary | ICD-10-CM

## 2023-03-18 DIAGNOSIS — I10 Essential (primary) hypertension: Secondary | ICD-10-CM

## 2023-03-18 MED ORDER — BEVACIZUMAB CHEMO INJECTION 1.25MG/0.05ML SYRINGE FOR KALEIDOSCOPE
1.2500 mg | INTRAVITREAL | Status: AC | PRN
  Administered 2023-03-18: 1.25 mg via INTRAVITREAL

## 2023-03-18 MED ORDER — SEMAGLUTIDE-WEIGHT MANAGEMENT 0.25 MG/0.5ML ~~LOC~~ SOAJ
0.2500 mg | SUBCUTANEOUS | 0 refills | Status: AC
  Filled 2023-03-18 – 2023-04-05 (×3): qty 2, 28d supply, fill #0

## 2023-03-18 MED ORDER — SEMAGLUTIDE-WEIGHT MANAGEMENT 0.5 MG/0.5ML ~~LOC~~ SOAJ
0.5000 mg | SUBCUTANEOUS | 0 refills | Status: AC
  Filled 2023-03-18: qty 2, 28d supply, fill #0

## 2023-03-18 NOTE — Telephone Encounter (Signed)
Patients son called - Shelley Mitchell, Shelley Mitchell   Son reviewed that at the last OV he wanted patients wegovy to go to the [facility]. He reported that a few days later he called to have it resent to the pharmacy that the pharmacy that they use at the place she lives at.   Today he is calling wanting to resend the Wegovy to the [facility] and wanted to know if it was in stock.   Called [facility] and they stated they had the 0.25 mg dose.   Called son back to make him aware he was thankful for the call.

## 2023-03-18 NOTE — Telephone Encounter (Signed)
Shelley Mitchell calling back for an update.

## 2023-03-28 ENCOUNTER — Other Ambulatory Visit: Payer: Self-pay

## 2023-04-05 ENCOUNTER — Other Ambulatory Visit: Payer: Self-pay

## 2023-04-13 NOTE — Progress Notes (Signed)
[facility] - Clinic Note  04/15/2023     CHIEF COMPLAINT Patient presents for Retina Follow Up   HISTORY OF PRESENT ILLNESS: Shelley Mitchell is a 82 y.o. female who presents to the clinic today for:   HPI     Retina Follow Up   Patient presents with  CRVO/BRVO.  In left eye.  This started 4 weeks ago.        Comments   Patient here for 4 weeks retina follow up for BRVO OS. Patient states vision is better. Was seeing a lot of little floaters. Now sees one big floater. No eye pain.       Last edited by Laddie Aquas, COA on 04/15/2023  8:59 AM.       Referring physician: Katherina Right, MD [address] [place],  [place] [address]  HISTORICAL INFORMATION:   Selected notes from the MEDICAL RECORD NUMBER Referred by Dr. Elmer Picker for concern of BDR / PDR LEE:  Ocular Hx- PMH-    CURRENT MEDICATIONS: Current Outpatient Medications (Ophthalmic Drugs)  Medication Sig   latanoprost (XALATAN) 0.005 % ophthalmic solution Place 1 drop into both eyes at bedtime.   No current facility-administered medications for this visit. (Ophthalmic Drugs)   Current Outpatient Medications (Other)  Medication Sig   acetaminophen (TYLENOL) 500 MG tablet Take 1,000 mg by mouth every 12 (twelve) hours.   albuterol (VENTOLIN HFA) 108 (90 Base) MCG/ACT inhaler Inhale 2 puffs into the lungs every 8 (eight) hours as needed for wheezing or shortness of breath.   Amino Acids-Protein Hydrolys (PRO-STAT AWC) LIQD Take 30 mLs by mouth 2 (two) times daily between meals.   amLODipine (NORVASC) 5 MG tablet Take 1 tablet (5 mg total) by mouth daily.   aspirin EC 81 MG tablet Take 81 mg by mouth daily. Swallow whole.   furosemide (LASIX) 20 MG tablet Take 20 mg by mouth daily.   hydrALAZINE (APRESOLINE) 25 MG tablet Take 25 mg by mouth daily.   levothyroxine (SYNTHROID) 25 MCG tablet Take 25 mcg by mouth daily before breakfast.   losartan (COZAAR) 50 MG tablet Take 1 tablet (50 mg  total) by mouth daily.   lubiprostone (AMITIZA) 8 MCG capsule Take 1 capsule (8 mcg total) by mouth daily with breakfast.   medroxyPROGESTERone (PROVERA) 10 MG tablet TAKE 1 TABLET BY MOUTH DAILY 10 DAYS PER MONTH (Patient taking differently: Take 10 mg by mouth See admin instructions. Take 10 mg once daily on the 1st-10th day of each month)   Multiple Vitamins-Minerals (MULTIVITAMIN WITH MINERALS) tablet Take 1 tablet by mouth daily.   nystatin powder Apply 1 Application topically 3 (three) times daily.   polyethylene glycol powder (GLYCOLAX/MIRALAX) 17 GM/SCOOP powder Take 17 g by mouth daily.   pravastatin (PRAVACHOL) 40 MG tablet Take 40 mg by mouth at bedtime.   Semaglutide-Weight Management 0.25 MG/0.5ML SOAJ Inject 0.25 mg into the skin once a week for 28 days.   Semaglutide-Weight Management 0.5 MG/0.5ML SOAJ Inject 0.5 mg into the skin once a week for 28 days.   [START ON 05/11/2023] Semaglutide-Weight Management 1 MG/0.5ML SOAJ Inject 1 mg into the skin once a week for 28 days.   [START ON 06/09/2023] Semaglutide-Weight Management 1.7 MG/0.75ML SOAJ Inject 1.7 mg into the skin once a week for 28 days.   [START ON 07/08/2023] Semaglutide-Weight Management 2.4 MG/0.75ML SOAJ Inject 2.4 mg into the skin once a week.   sertraline (ZOLOFT) 100 MG tablet Take 100  mg by mouth daily.   No current facility-administered medications for this visit. (Other)   REVIEW OF SYSTEMS: ROS   Positive for: Musculoskeletal, Cardiovascular, Eyes, Respiratory Last edited by Laddie Aquas, COA on 04/15/2023  8:58 AM.        ALLERGIES Allergies  Allergen Reactions   Ace Inhibitors Itching   Nsaids Itching   PAST MEDICAL HISTORY Past Medical History:  Diagnosis Date   Endometrial polyp    Dr. Despina Hidden   History of endometrial hyperplasia    HTN (hypertension)    Hx of adenomatous colonic polyps 1999   Hyperlipidemia    Mental disorder    GAD   Obesity    Osteoarthritis    Tachycardia     subsequently with bradycardia (?secondary to meds)   Past Surgical History:  Procedure Laterality Date   BACK SURGERY     bilateral knee replacements  2002/2003   CHOLECYSTECTOMY     COLONOSCOPY  03/2002   Dr. Fleet Contras internal hemorrhoids   COLONOSCOPY  06/21/2011   polyp at IC valve (tubular adenoma)   COLONOSCOPY N/A 07/15/2016   one 8 mm polyp at hepatic flexure (tubular adenoma), on 12 mm polyp at hepatic flexure (tubular adenoma), 3 year surveillance if health permits   COLONOSCOPY N/A 10/10/2019   descending colon diverticulosis, two 4-5 mm polyps at splenic flexure (tubular adenomas). No surveillance due to age.    LUMBAR LAMINECTOMY/DECOMPRESSION MICRODISCECTOMY N/A 04/27/2022   Procedure: Thoracic Eleven-Twelve Decompression;  Surgeon: Coletta Memos, MD;  Location[facility] OR;  Service: Neurosurgery;  Laterality: N/A;   POLYPECTOMY  07/15/2016   Procedure: POLYPECTOMY;  Surgeon: Corbin Ade, MD;  Location: [facility]ITE;  Service: Endoscopy;;  Splenic Flexure polyps x 2 removed via hot snare   POLYPECTOMY  10/10/2019   Procedure: POLYPECTOMY;  Surgeon: Corbin Ade, MD;  Location: [facility];  Service: Endoscopy;;   TUBAL LIGATION     FAMILY HISTORY Family History  Problem Relation Age of Onset   Diabetes Brother    Colon polyps Brother    Diabetes Sister    Cancer Father        Likely prostate   Stroke Mother 86   Colon cancer Neg Hx    SOCIAL HISTORY Social History   Tobacco Use   Smoking status: Never   Smokeless tobacco: Never  Vaping Use   Vaping Use: Never used  Substance Use Topics   Alcohol use: No    Alcohol/week: 0.0 standard drinks of alcohol   Drug use: No       OPHTHALMIC EXAM:  Base Eye Exam     Visual Acuity (Snellen - Linear)       Right Left   Dist cc 20/25 -1 20/100 -1   Dist ph cc NI 20/80 -2    Correction: Glasses         Tonometry (Tonopen, 8:55 AM)       Right Left   Pressure 13 11         Pupils       Dark  Light Shape React APD   Right 3 2 Round Minimal None   Left 3 2 Round Minimal None         Visual Fields (Counting fingers)       Left Right    Full Full         Extraocular Movement       Right Left    Full, Ortho Full, Ortho  Neuro/Psych     Oriented x3: Yes   Mood/Affect: Normal         Dilation     Both eyes: 1.0% Mydriacyl, 2.5% Phenylephrine @ 8:55 AM           Slit Lamp and Fundus Exam     Slit Lamp Exam       Right Left   Lids/Lashes Dermatochalasis - upper lid Dermatochalasis - upper lid   Conjunctiva/Sclera mild melanosis mild melanosis   Cornea arcus arcus   Anterior Chamber deep and clear deep and clear   Iris Round and dilated Round and dilated   Lens 2+ Nuclear sclerosis, 2+ Cortical cataract 2-3+ Nuclear sclerosis, 2-3+ Cortical cataract   Anterior Vitreous Vitreous syneresis, Posterior vitreous detachment, vitreous condensations Vitreous syneresis, Posterior vitreous detachment         Fundus Exam       Right Left   Disc nasal hyperemia -- improved, +heme, sharp rim, temporal elevation, PPA, mild temporal pallor, peripapillary subretinal heme superior and nasal to disc -- resolved Pink and Sharp, mild tilt, +PPA   C/D Ratio 0.3 0.3   Macula Flat, good foveal reflex, inferior macula atrophic, RPE mottling and clumping, No heme Blunted foveal reflex, central and inferior IRH and exudates -- improved, central cystic changes -- improved, retinal macroanuerysms inferior macula -- improving, scattered DBH -- improved, punctate exudates centrally   Vessels attenuated, Tortuous attenuated, Tortuous, retinal macroaneurysms along IT arcades   Periphery Attached, peripapillary subretinal heme superior and nasal to disc -- improved, peripheral subretinal heme and exudates temporal periphery -- improved Attached, scattered IRH           Refraction     Wearing Rx       Sphere Cylinder Axis Add   Right -1.25 +0.50 163 +2.00   Left  -0.75 +0.50 153 +2.00           IMAGING AND PROCEDURES  Imaging and Procedures for 04/15/2023           ASSESSMENT/PLAN:    ICD-10-CM   1. 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 Essential hypertension  I10     5. Hypertensive retinopathy of both eyes  H35.033     6. Combined forms of age-related cataract of both eyes  H25.813      1,2. BRVO w/ CME and retinal macronauerysm OS - s/p IVA OS #1 (12.15.23) #2 (01.12.24), #3 (02.09.24), #4 (03.15.24), #5 (04.12.24) - pt previously followed with Dr. Fayrene Fearing in [place], [place], but now lives in a facility in [place] and cannot be transported to [facility] - history of laser photoablation of retinal macroaneurysm OS per chart review - FA 12.15.23 shows focal MAs w/ late leakage - BCVA OS 20/80- stable - exam shows macroaneurysm with surrounding circinate exudates and edema, inferior macula OS -- improving - OCT shows elevation of disc, central ORA, drusen, SRHM, persistent IRF/IRHM temporal macula -- slightly improved - discussed decreased efficacy resistance to Avastin and potential benefit of switching medication - recommend switching IVE OS #1 today (5.10 24) - RBA of procedure discussed, questions answered - IVA informed consent obtained and signed, 12.15.23 (OS) - IVE informed consent obtained and signed, 05.10.24 (OS) - see procedure note - will check auth for Eylea - F/U  4 weeks -- DFE/OCT/possible injection  3. BRAO OD - OCT shows Diffuse IRA inferior macula / hemisphere -- ?remote BRAO; stable improvement in disc edema, trace cystic changes SN macula -- improved from prior  - exam shows peripapillary hemorrhage, mild NVD nasal disc -- improving  - FA 12.15.23 shows mild NVD nasal disc w/ leakage  - pt may benefit from PRP and anti-VEGF therapy  4,5.  Hypertensive retinopathy OU - discussed importance of tight BP control - continue to monitor  6. Mixed Cataract OU - The symptoms of cataract, surgical options, and treatments and risks were discussed with patient. - discussed diagnosis and progression - likely visually significant - under the expert management of Dr. Elmer Picker - clear from a retina standpoint to proceed with cataract surgery when pt and surgeon are ready  - would recommend timing cataract surgery ~1-2 wks after anti-VEGF injection  Ophthalmic Meds Ordered this visit:  No orders of the defined types were placed in this encounter.    No follow-ups on file.  There are no Patient Instructions on file for this visit.  This document serves as a record of services personally performed by Karie Chimera, MD, PhD. It was created on their behalf by Berlin Hun COT, an ophthalmic technician. The creation of this record is the provider's dictation and/or activities during the visit.    Electronically signed by: Berlin Hun COT 03/16/2023 9:31 AM  This document serves as a record of services personally performed by Karie Chimera, MD, PhD. It was created on their behalf by Glee Arvin. Manson Passey, OA an ophthalmic technician. The creation of this record is the provider's dictation and/or activities during the visit.    Electronically signed by: Glee Arvin. Manson Passey, [place] 04.12.2024 9:31 AM  This document serves as a record of services personally performed by Karie Chimera, MD, PhD. It was created on their behalf by Gerilyn Nestle, COT an ophthalmic technician. The creation of this record is the provider's dictation and/or activities during the visit.    Electronically signed by:  Gerilyn Nestle, COT 05.10.24 9:32 AM   Karie Chimera, M.D., Ph.D. Diseases & Surgery of the Retina and Vitreous [facility] 03/18/2023   I have reviewed the above documentation for accuracy and completeness, and I  agree with the above. Karie Chimera, M.D., Ph.D. 03/18/23 9:31 AM   Abbreviations: M myopia (nearsighted); A astigmatism; H hyperopia (farsighted); P presbyopia; Mrx spectacle prescription;  CTL contact lenses; OD right eye; OS left eye; OU both eyes  XT exotropia; ET esotropia; PEK punctate epithelial keratitis; PEE punctate epithelial erosions; DES dry eye syndrome; MGD meibomian gland dysfunction; ATs artificial tears; PFAT's preservative free artificial tears; NSC nuclear sclerotic cataract; PSC posterior subcapsular cataract; ERM epi-retinal membrane; PVD posterior vitreous detachment; RD retinal detachment; DM diabetes mellitus; DR diabetic retinopathy; NPDR non-proliferative diabetic retinopathy; PDR proliferative diabetic retinopathy; CSME clinically significant macular edema; DME diabetic macular edema; dbh dot blot hemorrhages; CWS cotton wool spot; POAG primary open angle glaucoma; C/D cup-to-disc ratio; HVF humphrey visual field; GVF goldmann visual field; OCT optical coherence tomography; IOP intraocular pressure; BRVO Branch retinal vein occlusion; CRVO central retinal vein occlusion; CRAO central retinal artery occlusion; BRAO branch retinal artery occlusion; RT retinal tear; SB scleral buckle; PPV pars plana vitrectomy; VH Vitreous hemorrhage; PRP panretinal laser photocoagulation; IVK intravitreal kenalog; VMT vitreomacular traction; MH Macular hole;  NVD neovascularization of the disc; NVE neovascularization elsewhere; AREDS age related eye disease study; ARMD age  related macular degeneration; POAG primary open angle glaucoma; EBMD epithelial/anterior basement membrane dystrophy; ACIOL anterior chamber intraocular lens; IOL intraocular lens; PCIOL posterior chamber intraocular lens; Phaco/IOL phacoemulsification with intraocular lens placement; PRK photorefractive keratectomy; LASIK laser assisted in situ keratomileusis; HTN hypertension; DM diabetes mellitus; COPD chronic obstructive  pulmonary disease

## 2023-04-15 ENCOUNTER — Encounter (INDEPENDENT_AMBULATORY_CARE_PROVIDER_SITE_OTHER): Payer: Self-pay | Admitting: Ophthalmology

## 2023-04-15 ENCOUNTER — Ambulatory Visit (INDEPENDENT_AMBULATORY_CARE_PROVIDER_SITE_OTHER): Payer: Medicare PPO | Admitting: Ophthalmology

## 2023-04-15 DIAGNOSIS — H34832 Tributary (branch) retinal vein occlusion, left eye, with macular edema: Secondary | ICD-10-CM | POA: Diagnosis not present

## 2023-04-15 DIAGNOSIS — H35033 Hypertensive retinopathy, bilateral: Secondary | ICD-10-CM | POA: Diagnosis not present

## 2023-04-15 DIAGNOSIS — I10 Essential (primary) hypertension: Secondary | ICD-10-CM

## 2023-04-15 DIAGNOSIS — H34231 Retinal artery branch occlusion, right eye: Secondary | ICD-10-CM

## 2023-04-15 DIAGNOSIS — H3509 Other intraretinal microvascular abnormalities: Secondary | ICD-10-CM

## 2023-04-15 DIAGNOSIS — H25813 Combined forms of age-related cataract, bilateral: Secondary | ICD-10-CM

## 2023-04-15 MED ORDER — AFLIBERCEPT 2MG/0.05ML IZ SOLN FOR KALEIDOSCOPE
2.0000 mg | INTRAVITREAL | Status: AC | PRN
  Administered 2023-04-15: 2 mg via INTRAVITREAL

## 2023-05-11 NOTE — Progress Notes (Addendum)
[facility] - Clinic Note  05/13/2023     CHIEF COMPLAINT Patient presents for Retina Follow Up   HISTORY OF PRESENT ILLNESS: Shelley Mitchell is a 82 y.o. female who presents to the clinic today for:   HPI     Retina Follow Up   Patient presents with  CRVO/BRVO.  In left eye.  This started 4 weeks ago.  I, the attending physician,  performed the HPI with the patient and updated documentation appropriately.        Comments   Patient here for 4 weeks retina follow up for BRVO OS. Patient states vision doing better. Beginning to see more dots in OD. Not all the time. No eye pain.       Last edited by Rennis Chris, MD on 05/13/2023  3:55 PM.    Pts BP is 189 / 83 today, her dr changes her BP medication that she had been on for 15-20 years  Referring physician: Mateo Flow, MD [address] [place],  [place] [address]  HISTORICAL INFORMATION:   Selected notes from the MEDICAL RECORD NUMBER Referred by Dr. Elmer Picker for concern of BDR / PDR LEE:  Ocular Hx- PMH-    CURRENT MEDICATIONS: Current Outpatient Medications (Ophthalmic Drugs)  Medication Sig   latanoprost (XALATAN) 0.005 % ophthalmic solution Place 1 drop into both eyes at bedtime.   No current facility-administered medications for this visit. (Ophthalmic Drugs)   Current Outpatient Medications (Other)  Medication Sig   acetaminophen (TYLENOL) 500 MG tablet Take 1,000 mg by mouth every 12 (twelve) hours.   albuterol (VENTOLIN HFA) 108 (90 Base) MCG/ACT inhaler Inhale 2 puffs into the lungs every 8 (eight) hours as needed for wheezing or shortness of breath.   Amino Acids-Protein Hydrolys (PRO-STAT AWC) LIQD Take 30 mLs by mouth 2 (two) times daily between meals.   amLODipine (NORVASC) 5 MG tablet Take 1 tablet (5 mg total) by mouth daily.   aspirin EC 81 MG tablet Take 81 mg by mouth daily. Swallow whole.   furosemide (LASIX) 20 MG tablet Take 20 mg by mouth daily.   hydrALAZINE  (APRESOLINE) 25 MG tablet Take 25 mg by mouth daily.   levothyroxine (SYNTHROID) 25 MCG tablet Take 25 mcg by mouth daily before breakfast.   losartan (COZAAR) 50 MG tablet Take 1 tablet (50 mg total) by mouth daily.   lubiprostone (AMITIZA) 8 MCG capsule Take 1 capsule (8 mcg total) by mouth daily with breakfast.   medroxyPROGESTERone (PROVERA) 10 MG tablet TAKE 1 TABLET BY MOUTH DAILY 10 DAYS PER MONTH (Patient taking differently: Take 10 mg by mouth See admin instructions. Take 10 mg once daily on the 1st-10th day of each month)   Multiple Vitamins-Minerals (MULTIVITAMIN WITH MINERALS) tablet Take 1 tablet by mouth daily.   nystatin powder Apply 1 Application topically 3 (three) times daily.   polyethylene glycol powder (GLYCOLAX/MIRALAX) 17 GM/SCOOP powder Take 17 g by mouth daily.   pravastatin (PRAVACHOL) 40 MG tablet Take 40 mg by mouth at bedtime.   Semaglutide-Weight Management 1 MG/0.5ML SOAJ Inject 1 mg into the skin once a week for 28 days.   [START ON 06/09/2023] Semaglutide-Weight Management 1.7 MG/0.75ML SOAJ Inject 1.7 mg into the skin once a week for 28 days.   [START ON 07/08/2023] Semaglutide-Weight Management 2.4 MG/0.75ML SOAJ Inject 2.4 mg into the skin once a week.   sertraline (ZOLOFT) 100 MG tablet Take 100 mg by mouth daily.   No current  facility-administered medications for this visit. (Other)   REVIEW OF SYSTEMS: ROS   Positive for: Musculoskeletal, Cardiovascular, Eyes, Respiratory Last edited by Laddie Aquas, COA on 05/13/2023  9:58 AM.      ALLERGIES Allergies  Allergen Reactions   Ace Inhibitors Itching   Nsaids Itching   PAST MEDICAL HISTORY Past Medical History:  Diagnosis Date   Endometrial polyp    Dr. Despina Hidden   History of endometrial hyperplasia    HTN (hypertension)    Hx of adenomatous colonic polyps 1999   Hyperlipidemia    Mental disorder    GAD   Obesity    Osteoarthritis    Tachycardia    subsequently with bradycardia (?secondary to  meds)   Past Surgical History:  Procedure Laterality Date   BACK SURGERY     bilateral knee replacements  2002/2003   CHOLECYSTECTOMY     COLONOSCOPY  03/2002   Dr. Fleet Contras internal hemorrhoids   COLONOSCOPY  06/21/2011   polyp at IC valve (tubular adenoma)   COLONOSCOPY N/A 07/15/2016   one 8 mm polyp at hepatic flexure (tubular adenoma), on 12 mm polyp at hepatic flexure (tubular adenoma), 3 year surveillance if health permits   COLONOSCOPY N/A 10/10/2019   descending colon diverticulosis, two 4-5 mm polyps at splenic flexure (tubular adenomas). No surveillance due to age.    LUMBAR LAMINECTOMY/DECOMPRESSION MICRODISCECTOMY N/A 04/27/2022   Procedure: Thoracic Eleven-Twelve Decompression;  Surgeon: Coletta Memos, MD;  Location: [facility] OR;  Service: Neurosurgery;  Laterality: N/A;   POLYPECTOMY  07/15/2016   Procedure: POLYPECTOMY;  Surgeon: Corbin Ade, MD;  Location: [facility];  Service: Endoscopy;;  Splenic Flexure polyps x 2 removed via hot snare   POLYPECTOMY  10/10/2019   Procedure: POLYPECTOMY;  Surgeon: Corbin Ade, MD;  Location: [facility];  Service: Endoscopy;;   TUBAL LIGATION     FAMILY HISTORY Family History  Problem Relation Age of Onset   Diabetes Brother    Colon polyps Brother    Diabetes Sister    Cancer Father        Likely prostate   Stroke Mother 57   Colon cancer Neg Hx    SOCIAL HISTORY Social History   Tobacco Use   Smoking status: Never   Smokeless tobacco: Never  Vaping Use   Vaping Use: Never used  Substance Use Topics   Alcohol use: No    Alcohol/week: 0.0 standard drinks of alcohol   Drug use: No       OPHTHALMIC EXAM:  Base Eye Exam     Visual Acuity (Snellen - Linear)       Right Left   Dist cc 20/30 -2 20/80   Dist ph cc NI NI    Correction: Glasses         Tonometry (Tonopen, 9:55 AM)       Right Left   Pressure 11 09         Pupils       Dark Light Shape React APD   Right 3 2 Round Minimal None    Left 3 2 Round Minimal None         Visual Fields (Counting fingers)       Left Right    Full Full         Extraocular Movement       Right Left    Full, Ortho Full, Ortho         Neuro/Psych     Oriented  x3: Yes   Mood/Affect: Normal         Dilation     Both eyes: 1.0% Mydriacyl, 2.5% Phenylephrine @ 9:55 AM           Slit Lamp and Fundus Exam     Slit Lamp Exam       Right Left   Lids/Lashes Dermatochalasis - upper lid Dermatochalasis - upper lid   Conjunctiva/Sclera mild melanosis mild melanosis   Cornea arcus arcus   Anterior Chamber deep and clear deep and clear   Iris Round and dilated Round and dilated   Lens 2+ Nuclear sclerosis, 2+ Cortical cataract 2-3+ Nuclear sclerosis, 2-3+ Cortical cataract   Anterior Vitreous Vitreous syneresis, Posterior vitreous detachment, vitreous condensations Vitreous syneresis, Posterior vitreous detachment         Fundus Exam       Right Left   Disc nasal hyperemia -- improved, sharp rim, PPA, mild temporal pallor, peripapillary subretinal heme superior and nasal to disc -- resolved Pink and Sharp, mild tilt, +PPA   C/D Ratio 0.3 0.3   Macula Flat, good foveal reflex, inferior macula atrophic, RPE mottling and clumping, new subretinal heme and edema superior macula Blunted foveal reflex, central and inferior IRH and exudates -- improved, central cystic changes -- improved, retinal macroanuerysms inferior macula -- improving, scattered DBH -- improved, punctate exudates centrally -- improved   Vessels attenuated, Tortuous, new macroanuerysms with heme along ST arcades attenuated, Tortuous, retinal macroaneurysms along IT arcades--improved   Periphery Attached, peripapillary subretinal heme superior and nasal to disc -- resolved Attached, scattered IRH--improved/essentially resolved           Refraction     Wearing Rx       Sphere Cylinder Axis Add   Right -1.25 +0.50 163 +2.00   Left -0.75 +0.50 153  +2.00           IMAGING AND PROCEDURES  Imaging and Procedures for 05/13/2023  OCT, Retina - OU - Both Eyes       Right Eye Quality was borderline. Central Foveal Thickness: 232. Progression has worsened. Findings include normal foveal contour, no SRF, myopic contour, intraretinal hyper-reflective material, intraretinal fluid, subretinal fluid, inner retinal atrophy (Interval development of macroaneurysm along ST arcades, Diffuse IRA inferior macula / hemisphere -- remote BRAO; stable improvement in disc edema).   Left Eye Quality was good. Central Foveal Thickness: 349. Progression has improved. Findings include abnormal foveal contour, myopic contour, retinal drusen , subretinal hyper-reflective material, intraretinal hyper-reflective material, intraretinal fluid, outer retinal atrophy (Stable improvement in disc elevation, central ORA, drusen, SRHM, persistent IRF/IRHM temporal macula -- slightly improved).   Notes *Images captured and stored on drive  Diagnosis / Impression:  OD: Interval development of macroaneurysm w/ surrounding edema along ST arcades, Diffuse IRA inferior macula / hemisphere -- remote BRAO; stable improvement in disc edema OS: Stable improvement in disc elevation, central ORA, drusen, SRHM, persistent IRF/IRHM temporal macula -- slightly improved  Clinical management:  See below  Abbreviations: NFP - Normal foveal profile. CME - cystoid macular edema. PED - pigment epithelial detachment. IRF - intraretinal fluid. SRF - subretinal fluid. EZ - ellipsoid zone. ERM - epiretinal membrane. ORA - outer retinal atrophy. ORT - outer retinal tubulation. SRHM - subretinal hyper-reflective material. IRHM - intraretinal hyper-reflective material      Intravitreal Injection, Pharmacologic Agent - OS - Left Eye       Time Out 05/13/2023. 11:00 AM. Confirmed correct patient, procedure, site, and patient consented.  Anesthesia Topical anesthesia was used. Anesthetic  medications included Lidocaine 2%, Proparacaine 0.5%.   Procedure Preparation included 5% betadine to ocular surface, eyelid speculum. A (33g) needle was used.   Injection: 1.25 mg Bevacizumab 1.25mg /0.37ml   Route: Intravitreal, Site: Left Eye   NDC: P3213405, Lot: [id number], Expiration date: 03/28/2024   Post-op Post injection exam found visual acuity of at least counting fingers. The patient tolerated the procedure well. There were no complications. The patient received written and verbal post procedure care education. Post injection medications were not given.      Intravitreal Injection, Pharmacologic Agent - OD - Right Eye       Time Out 05/13/2023. 11:17 AM. Confirmed correct patient, procedure, site, and patient consented.   Anesthesia Topical anesthesia was used. Anesthetic medications included Lidocaine 2%, Proparacaine 0.5%.   Procedure Preparation included 5% betadine to ocular surface, eyelid speculum. A (33g) needle was used.   Injection: 1.25 mg Bevacizumab 1.25mg /0.52ml   Route: Intravitreal, Site: Right Eye   NDC: P3213405, Lot: [id number], Expiration date: 09/27/2024   Post-op Post injection exam found visual acuity of at least counting fingers. The patient tolerated the procedure well. There were no complications. The patient received written and verbal post procedure care education. Post injection medications were not given.            ASSESSMENT/PLAN:    ICD-10-CM   1. 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 Essential hypertension  I10     4. Hypertensive retinopathy of both eyes  H35.033     5.  Combined forms of age-related cataract of both eyes  H25.813      1,2. BRVO w/ CME and retinal macronauerysm OS - s/p IVA OS #1 (12.15.23) #2 (01.12.24), #3 (02.09.24), #4 (03.15.24), #5 (04.12.24) - s/p IVE OS #1 (05.10.24) - pt previously followed with Dr. Fayrene Fearing in [place], [place], but now lives in a facility in [place] and cannot be transported to [facility] - history of laser photoablation of retinal macroaneurysm OS per chart review - FA 12.15.23 shows focal MAs w/ late leakage - BCVA OS 20/80- stable - exam shows macroaneurysm with surrounding circinate exudates and edema, inferior macula OS -- improving - OCT shows OS: Stable improvement in disc elevation, central ORA, drusen, SRHM, persistent IRF/IRHM temporal macula -- slightly improved - recommend IVE OS #2 today, 06.07 24 -- AVASTIN erroneously pulled and administered without complication - RBA of procedure discussed, questions answered - IVA informed consent obtained and signed, 12.15.23 (OS) - IVE informed consent obtained and signed, 06.07.24 (OU) - see procedure note - f/u July 8 -- DFE/OCT/possible injection  1,2. BRVO  w/ CME and retinal macroaneurysm OD - OCT shows OD: Interval development of macroaneurysm along ST arcades, Diffuse IRA inferior macula / hemisphere -- remote BRAO; stable improvement in disc edema  - exam shows peripapillary hemorrhage, mild NVD nasal disc -- improving + new macroaneurysm w/ surrounding heme and edema along ST arcades  - FA 12.15.23 shows mild NVD nasal disc w/ leakage  - pt may benefit from PRP and anti-VEGF  therapy  - BCVA OD 20/30 from 20/25  - recommend IVA OD #1 today, 06.07.27, for new macular edema from BRVO  - pt wishes to proceed with injection  - RBA of procedure discussed, questions answered - IVE informed consent obtained and signed, 06.07.24 (OU) - see procedure note - f/u July 8, DFE, OCT  3,4. Hypertensive retinopathy OU  - BP in office Blood pressure (!) 189/83, pulse (!)  43.  - pt and son report recent changes in BP meds - discussed importance of tight BP control and likely relationship with BRVOs  5. Mixed Cataract OU - The symptoms of cataract, surgical options, and treatments and risks were discussed with patient. - discussed diagnosis and progression - likely visually significant - under the expert management of Dr. Elmer Picker - clear from a retina standpoint to proceed with cataract surgery when pt and surgeon are ready  - would recommend timing cataract surgery ~1-2 wks after anti-VEGF injection  Ophthalmic Meds Ordered this visit:  Meds ordered this encounter  Medications   DISCONTD: aflibercept (EYLEA) SOLN 2 mg   Bevacizumab (AVASTIN) SOLN 1.25 mg   Bevacizumab (AVASTIN) SOLN 1.25 mg     Return in about 31 days (around 06/13/2023) for f/u BRVO OS, DFE, OCT, Possible Injxn.  There are no Patient Instructions on file for this visit.  This document serves as a record of services personally performed by Karie Chimera, MD, PhD. It was created on their behalf by Berlin Hun COT, an ophthalmic technician. The creation of this record is the provider's dictation and/or activities during the visit.    Electronically signed by: Berlin Hun COT 06.05.2024 4:12 PM  This document serves as a record of services personally performed by Karie Chimera, MD, PhD. It was created on their behalf by Glee Arvin. Manson Passey, OA an ophthalmic technician. The creation of this record is the provider's dictation and/or activities during the visit.    Electronically signed by: Glee Arvin. Manson Passey, [place] 06.07.2024 4:12 PM  Karie Chimera, M.D., Ph.D. Diseases & Surgery of the Retina and Vitreous [facility] [address] [facility] 05/13/2023   I have reviewed the above documentation for accuracy and completeness, and I agree with the above. Karie Chimera, M.D., Ph.D. 05/13/23 4:12 PM  Abbreviations: M myopia (nearsighted); A astigmatism; H hyperopia  (farsighted); P presbyopia; Mrx spectacle prescription;  CTL contact lenses; OD right eye; OS left eye; OU both eyes  XT exotropia; ET esotropia; PEK punctate epithelial keratitis; PEE punctate epithelial erosions; DES dry eye syndrome; MGD meibomian gland dysfunction; ATs artificial tears; PFAT's preservative free artificial tears; NSC nuclear sclerotic cataract; PSC posterior subcapsular cataract; ERM epi-retinal membrane; PVD posterior vitreous detachment; RD retinal detachment; DM diabetes mellitus; DR diabetic retinopathy; NPDR non-proliferative diabetic retinopathy; PDR proliferative diabetic retinopathy; CSME clinically significant macular edema; DME diabetic macular edema; dbh dot blot hemorrhages; CWS cotton wool spot; POAG primary open angle glaucoma; C/D cup-to-disc ratio; HVF humphrey visual field; GVF goldmann visual field; OCT optical coherence tomography; IOP intraocular pressure; BRVO Branch retinal vein occlusion; CRVO central retinal vein occlusion; CRAO central retinal artery occlusion; BRAO branch retinal artery occlusion; RT retinal tear; SB scleral buckle; PPV pars plana vitrectomy; VH Vitreous hemorrhage; PRP panretinal laser photocoagulation; IVK intravitreal kenalog; VMT vitreomacular traction; MH Macular hole;  NVD neovascularization of the disc; NVE neovascularization elsewhere; AREDS age related eye disease study; ARMD age related macular degeneration; POAG primary open angle glaucoma; EBMD epithelial/anterior basement membrane dystrophy; ACIOL anterior chamber intraocular  lens; IOL intraocular lens; PCIOL posterior chamber intraocular lens; Phaco/IOL phacoemulsification with intraocular lens placement; [place]er photorefractive keratectomy; LASIK laser assisted in situ keratomileusis; HTN hypertension; DM diabetes mellitus; COPD chronic obstructive pulmonary disease

## 2023-05-13 ENCOUNTER — Ambulatory Visit (INDEPENDENT_AMBULATORY_CARE_PROVIDER_SITE_OTHER): Payer: Medicare PPO | Admitting: Ophthalmology

## 2023-05-13 ENCOUNTER — Encounter (INDEPENDENT_AMBULATORY_CARE_PROVIDER_SITE_OTHER): Payer: Self-pay | Admitting: Ophthalmology

## 2023-05-13 VITALS — BP 189/83 | HR 43

## 2023-05-13 DIAGNOSIS — H35033 Hypertensive retinopathy, bilateral: Secondary | ICD-10-CM

## 2023-05-13 DIAGNOSIS — H34833 Tributary (branch) retinal vein occlusion, bilateral, with macular edema: Secondary | ICD-10-CM | POA: Diagnosis not present

## 2023-05-13 DIAGNOSIS — H34832 Tributary (branch) retinal vein occlusion, left eye, with macular edema: Secondary | ICD-10-CM

## 2023-05-13 DIAGNOSIS — H25813 Combined forms of age-related cataract, bilateral: Secondary | ICD-10-CM

## 2023-05-13 DIAGNOSIS — H34231 Retinal artery branch occlusion, right eye: Secondary | ICD-10-CM

## 2023-05-13 DIAGNOSIS — H3509 Other intraretinal microvascular abnormalities: Secondary | ICD-10-CM

## 2023-05-13 DIAGNOSIS — I10 Essential (primary) hypertension: Secondary | ICD-10-CM | POA: Diagnosis not present

## 2023-05-13 MED ORDER — AFLIBERCEPT 2MG/0.05ML IZ SOLN FOR KALEIDOSCOPE: 2.0000 mg | INTRAVITREAL | Status: DC | PRN

## 2023-05-13 MED ORDER — BEVACIZUMAB CHEMO INJECTION 1.25MG/0.05ML SYRINGE FOR KALEIDOSCOPE
1.2500 mg | INTRAVITREAL | Status: AC | PRN
  Administered 2023-05-13: 1.25 mg via INTRAVITREAL

## 2023-06-07 NOTE — Progress Notes (Signed)
[facility] - Clinic Note  06/13/2023     CHIEF COMPLAINT Patient presents for Retina Follow Up   HISTORY OF PRESENT ILLNESS: Shelley Mitchell is a 82 y.o. female who presents to the clinic today for:   HPI     Retina Follow Up   In left eye.  This started 1 month ago.  Duration of 1 month.  Since onset it is stable.  I, the attending physician,  performed the HPI with the patient and updated documentation appropriately.        Comments   1 month retina follow up BVO OS and I'VE OS pt is reporting her vision is little better she is seeing floaters but denies any flashes of light she has noticed a web in her right eye at times pt is using latanoprost at bed time       Last edited by Rennis Chris, MD on 06/13/2023  9:13 AM.    Pt feels like her vision is better, she is seeing a "web" in her right eye  Referring physician: Mateo Flow, MD [address] [place],  [place] [address]  HISTORICAL INFORMATION:   Selected notes from the MEDICAL RECORD NUMBER Referred by Dr. Elmer Picker for concern of BDR / PDR LEE:  Ocular Hx- PMH-    CURRENT MEDICATIONS: Current Outpatient Medications (Ophthalmic Drugs)  Medication Sig   latanoprost (XALATAN) 0.005 % ophthalmic solution Place 1 drop into both eyes at bedtime.   No current facility-administered medications for this visit. (Ophthalmic Drugs)   Current Outpatient Medications (Other)  Medication Sig   acetaminophen (TYLENOL) 500 MG tablet Take 1,000 mg by mouth every 12 (twelve) hours.   albuterol (VENTOLIN HFA) 108 (90 Base) MCG/ACT inhaler Inhale 2 puffs into the lungs every 8 (eight) hours as needed for wheezing or shortness of breath.   Amino Acids-Protein Hydrolys (PRO-STAT AWC) LIQD Take 30 mLs by mouth 2 (two) times daily between meals.   amLODipine (NORVASC) 5 MG tablet Take 1 tablet (5 mg total) by mouth daily.   aspirin EC 81 MG tablet Take 81 mg by mouth daily. Swallow whole.   furosemide  (LASIX) 20 MG tablet Take 20 mg by mouth daily.   hydrALAZINE (APRESOLINE) 25 MG tablet Take 25 mg by mouth daily.   levothyroxine (SYNTHROID) 25 MCG tablet Take 25 mcg by mouth daily before breakfast.   losartan (COZAAR) 50 MG tablet Take 1 tablet (50 mg total) by mouth daily.   lubiprostone (AMITIZA) 8 MCG capsule Take 1 capsule (8 mcg total) by mouth daily with breakfast.   medroxyPROGESTERone (PROVERA) 10 MG tablet TAKE 1 TABLET BY MOUTH DAILY 10 DAYS PER MONTH (Patient taking differently: Take 10 mg by mouth See admin instructions. Take 10 mg once daily on the 1st-10th day of each month)   Multiple Vitamins-Minerals (MULTIVITAMIN WITH MINERALS) tablet Take 1 tablet by mouth daily.   nystatin powder Apply 1 Application topically 3 (three) times daily.   polyethylene glycol powder (GLYCOLAX/MIRALAX) 17 GM/SCOOP powder Take 17 g by mouth daily.   pravastatin (PRAVACHOL) 40 MG tablet Take 40 mg by mouth at bedtime.   Semaglutide-Weight Management 1.7 MG/0.75ML SOAJ Inject 1.7 mg into the skin once a week for 28 days.   [START ON 07/08/2023] Semaglutide-Weight Management 2.4 MG/0.75ML SOAJ Inject 2.4 mg into the skin once a week.   sertraline (ZOLOFT) 100 MG tablet Take 100 mg by mouth daily.   No current facility-administered medications for this visit. (Other)  REVIEW OF SYSTEMS: ROS   Positive for: Musculoskeletal, Cardiovascular, Eyes, Respiratory Last edited by Etheleen Mayhew, COT on 06/13/2023  8:17 AM.       ALLERGIES Allergies  Allergen Reactions   Ace Inhibitors Itching   Nsaids Itching   PAST MEDICAL HISTORY Past Medical History:  Diagnosis Date   Endometrial polyp    Dr. Despina Hidden   History of endometrial hyperplasia    HTN (hypertension)    Hx of adenomatous colonic polyps 1999   Hyperlipidemia    Mental disorder    GAD   Obesity    Osteoarthritis    Tachycardia    subsequently with bradycardia (?secondary to meds)   Past Surgical History:  Procedure  Laterality Date   BACK SURGERY     bilateral knee replacements  2002/2003   CHOLECYSTECTOMY     COLONOSCOPY  03/2002   Dr. Fleet Contras internal hemorrhoids   COLONOSCOPY  06/21/2011   polyp at IC valve (tubular adenoma)   COLONOSCOPY N/A 07/15/2016   one 8 mm polyp at hepatic flexure (tubular adenoma), on 12 mm polyp at hepatic flexure (tubular adenoma), 3 year surveillance if health permits   COLONOSCOPY N/A 10/10/2019   descending colon diverticulosis, two 4-5 mm polyps at splenic flexure (tubular adenomas). No surveillance due to age.    LUMBAR LAMINECTOMY/DECOMPRESSION MICRODISCECTOMY N/A 04/27/2022   Procedure: Thoracic Eleven-Twelve Decompression;  Surgeon: Coletta Memos, MD;  Location: [facility];  Service: Neurosurgery;  Laterality: N/A;   POLYPECTOMY  07/15/2016   Procedure: POLYPECTOMY;  Surgeon: Corbin Ade, MD;  Location: [facility];  Service: Endoscopy;;  Splenic Flexure polyps x 2 removed via hot snare   POLYPECTOMY  10/10/2019   Procedure: POLYPECTOMY;  Surgeon: Corbin Ade, MD;  Location: [facility];  Service: Endoscopy;;   TUBAL LIGATION     FAMILY HISTORY Family History  Problem Relation Age of Onset   Diabetes Brother    Colon polyps Brother    Diabetes Sister    Cancer Father        Likely prostate   Stroke Mother 8   Colon cancer Neg Hx    SOCIAL HISTORY Social History   Tobacco Use   Smoking status: Never   Smokeless tobacco: Never  Vaping Use   Vaping Use: Never used  Substance Use Topics   Alcohol use: No    Alcohol/week: 0.0 standard drinks of alcohol   Drug use: No       OPHTHALMIC EXAM:  Base Eye Exam     Visual Acuity (Snellen - Linear)       Right Left   Dist cc 20/50 -2 20/100 -2   Dist ph cc 20/40 -1 NI         Tonometry (Tonopen, 8:24 AM)       Right Left   Pressure 11 9         Pupils       Pupils Dark Light Shape React APD   Right PERRL 3 2 Round Brisk None   Left PERRL 3 2 Round Brisk None          Visual Fields       Left Right    Full Full         Extraocular Movement       Right Left    Full, Ortho Full, Ortho         Neuro/Psych     Oriented x3: Yes   Mood/Affect: Normal  Dilation     Both eyes: 2.5% Phenylephrine @ 8:24 AM           Slit Lamp and Fundus Exam     Slit Lamp Exam       Right Left   Lids/Lashes Dermatochalasis - upper lid Dermatochalasis - upper lid   Conjunctiva/Sclera mild melanosis mild melanosis   Cornea arcus arcus   Anterior Chamber deep and clear deep and clear   Iris Round and dilated Round and dilated   Lens 2+ Nuclear sclerosis, 2+ Cortical cataract 2-3+ Nuclear sclerosis, 2-3+ Cortical cataract   Anterior Vitreous Vitreous syneresis, Posterior vitreous detachment, vitreous condensations Vitreous syneresis, Posterior vitreous detachment         Fundus Exam       Right Left   Disc nasal hyperemia -- improved, sharp rim, PPA, mild temporal pallor, peripapillary subretinal heme superior and nasal to disc -- resolved Pink and Sharp, mild tilt, +PPA   C/D Ratio 0.3 0.3   Macula Flat, good foveal reflex, inferior macula atrophic, RPE mottling and clumping, subretinal heme and edema superior macula -- slightly improved Blunted foveal reflex, central and inferior IRH and exudates -- improved, central cystic changes -- slightly increased, retinal macroanuerysms inferior macula -- improved, scattered DBH -- improved, punctate exudates centrally   Vessels attenuated, Tortuous, new macroanuerysms with heme along ST arcades attenuated, Tortuous, retinal macroaneurysms along IT arcades--improved   Periphery Attached, peripapillary subretinal heme superior and nasal to disc -- resolved Attached, scattered IRH--improved/essentially resolved           Refraction     Wearing Rx       Sphere Cylinder Axis Add   Right -1.25 +0.50 163 +2.00   Left -0.75 +0.50 153 +2.00           IMAGING AND PROCEDURES  Imaging and  Procedures for 06/13/2023  OCT, Retina - OU - Both Eyes       Right Eye Quality was good. Central Foveal Thickness: 235. Progression has improved. Findings include normal foveal contour, no SRF, myopic contour, intraretinal hyper-reflective material, intraretinal fluid, subretinal fluid, inner retinal atrophy (Interval improvement macroaneurysm along ST arcades, Diffuse IRA inferior macula / hemisphere -- remote BRAO; stable improvement in disc edema).   Left Eye Quality was good. Central Foveal Thickness: 363. Progression has worsened. Findings include abnormal foveal contour, myopic contour, retinal drusen , subretinal hyper-reflective material, intraretinal hyper-reflective material, intraretinal fluid, outer retinal atrophy (Stable improvement in disc elevation, central ORA, drusen, SRHM, persistent IRF/IRHM temporal macula -- slightly increased).   Notes *Images captured and stored on drive  Diagnosis / Impression:  OD: Interval improvement macroaneurysm along ST arcades, Diffuse IRA inferior macula / hemisphere -- remote BRAO; stable improvement in disc edema OS: Stable improvement in disc elevation, central ORA, drusen, SRHM, persistent IRF/IRHM temporal macula -- slightly increased  Clinical management:  See below  Abbreviations: NFP - Normal foveal profile. CME - cystoid macular edema. PED - pigment epithelial detachment. IRF - intraretinal fluid. SRF - subretinal fluid. EZ - ellipsoid zone. ERM - epiretinal membrane. ORA - outer retinal atrophy. ORT - outer retinal tubulation. SRHM - subretinal hyper-reflective material. IRHM - intraretinal hyper-reflective material      Intravitreal Injection, Pharmacologic Agent - OS - Left Eye       Time Out 06/13/2023. 8:46 AM. Confirmed correct patient, procedure, site, and patient consented.   Anesthesia Topical anesthesia was used. Anesthetic medications included Lidocaine 2%, Proparacaine 0.5%.   Procedure Preparation included 5%  betadine to ocular surface, eyelid speculum. A (33g) needle was used.   Injection: 2 mg aflibercept 2 MG/0.05ML   Route: Intravitreal, Site: Left Eye   NDC: L6038910, Lot: [id number], Expiration date: 06/04/2024, Waste: 0 mL   Post-op Post injection exam found visual acuity of at least counting fingers. The patient tolerated the procedure well. There were no complications. The patient received written and verbal post procedure care education. Post injection medications were not given.      Intravitreal Injection, Pharmacologic Agent - OD - Right Eye       Time Out 06/13/2023. 8:48 AM. Confirmed correct patient, procedure, site, and patient consented.   Anesthesia Topical anesthesia was used. Anesthetic medications included Lidocaine 2%, Proparacaine 0.5%.   Procedure Preparation included 5% betadine to ocular surface, eyelid speculum. A (33g) needle was used.   Injection: 1.25 mg Bevacizumab 1.25mg /0.41ml   Route: Intravitreal, Site: Right Eye   NDC: P3213405, Lot: [id number], Expiration date: 03/28/2024   Post-op Post injection exam found visual acuity of at least counting fingers. The patient tolerated the procedure well. There were no complications. The patient received written and verbal post procedure care education. Post injection medications were not given.            ASSESSMENT/PLAN:    ICD-10-CM   1. 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 Essential hypertension  I10     4. Hypertensive retinopathy of both eyes  H35.033     5. Combined forms of age-related cataract of both eyes  H25.813       1,2. BRVO w/ CME and retinal macronauerysm OS - s/p IVA OS #1  (12.15.23) #2 (01.12.24), #3 (02.09.24), #4 (03.15.24), #5 (04.12.24), #6 (06.07.24) - s/p IVE OS #1 (05.10.24) - pt previously followed with Dr. Fayrene Fearing in [place], [place], but now lives in a facility in [place] and cannot be transported to [facility] - history of laser photoablation of retinal macroaneurysm OS per chart review - FA 12.15.23 shows focal MAs w/ late leakage - BCVA OS 20/100 - decreased - exam shows macroaneurysm with surrounding circinate exudates and edema, inferior macula OS -- improved - OCT shows OS: Stable improvement in disc elevation, central ORA, drusen, SRHM, persistent IRF/IRHM temporal macula -- slightly increased - recommend IVE OS #2 today, 07.08.24 - RBA of procedure discussed, questions answered - IVE informed consent obtained and signed, 06.07.24 (OU) - see procedure note - f/u 4 weeks-- DFE/OCT/possible injection  1,2. BRVO  w/ CME and retinal macroaneurysm OD  - s/p IVA OD #1 (06.07.24) - OCT shows OD: Interval improvement in macroaneurysm along ST arcades, Diffuse IRA inferior macula / hemisphere -- remote BRAO; stable improvement in disc edema  - exam shows peripapillary hemorrhage, mild NVD nasal disc -- improving + new macroaneurysm w/ surrounding heme and edema along ST arcades  - FA 12.15.23 shows mild NVD nasal disc w/ leakage  - pt may benefit from PRP and anti-VEGF therapy  - BCVA OD 20/40 from 20/30  - recommend IVA OD #2 today, 07.08.24, for macular edema from BRVO  - pt wishes to proceed with injection  - RBA  of procedure discussed, questions answered - see procedure note - f/u 4 weeks, DFE, OCT  3,4. Hypertensive retinopathy OU  - pt and son report recent changes in BP meds - discussed importance of tight BP control and likely relationship with BRVOs  5. Mixed Cataract OU - The symptoms of cataract, surgical options, and treatments and risks were discussed with patient. - discussed diagnosis and progression - likely visually significant -  under the expert management of Dr. Elmer Picker - clear from a retina standpoint to proceed with cataract surgery when pt and surgeon are ready  - would recommend timing cataract surgery ~1-2 wks after anti-VEGF injection  Ophthalmic Meds Ordered this visit:  Meds ordered this encounter  Medications   aflibercept (EYLEA) SOLN 2 mg   Bevacizumab (AVASTIN) SOLN 1.25 mg     Return in about 4 weeks (around 07/11/2023) for f/u BRVO OS, DFE, OCT.  There are no Patient Instructions on file for this visit.  This document serves as a record of services personally performed by Karie Chimera, MD, PhD. It was created on their behalf by Annalee Genta, COMT. The creation of this record is the provider's dictation and/or activities during the visit.  Electronically signed by: Annalee Genta, COMT 06/13/23 9:15 AM  This document serves as a record of services personally performed by Karie Chimera, MD, PhD. It was created on their behalf by Glee Arvin. Manson Passey, OA an ophthalmic technician. The creation of this record is the provider's dictation and/or activities during the visit.    Electronically signed by: Glee Arvin. Manson Passey, OA 06/13/23 9:15 AM  Karie Chimera, M.D., Ph.D. Diseases & Surgery of the Retina and Vitreous [facility] 06/13/2023   I have reviewed the above documentation for accuracy and completeness, and I agree with the above. Karie Chimera, M.D., Ph.D. 06/13/23 9:16 AM   Abbreviations: M myopia (nearsighted); A astigmatism; H hyperopia (farsighted); P presbyopia; Mrx spectacle prescription;  CTL contact lenses; OD right eye; OS left eye; OU both eyes  XT exotropia; ET esotropia; PEK punctate epithelial keratitis; PEE punctate epithelial erosions; DES dry eye syndrome; MGD meibomian gland dysfunction; ATs artificial tears; PFAT's preservative free artificial tears; NSC nuclear sclerotic cataract; PSC posterior subcapsular cataract; ERM epi-retinal membrane; PVD posterior  vitreous detachment; RD retinal detachment; DM diabetes mellitus; DR diabetic retinopathy; NPDR non-proliferative diabetic retinopathy; PDR proliferative diabetic retinopathy; CSME clinically significant macular edema; DME diabetic macular edema; dbh dot blot hemorrhages; CWS cotton wool spot; POAG primary open angle glaucoma; C/D cup-to-disc ratio; HVF humphrey visual field; GVF goldmann visual field; OCT optical coherence tomography; IOP intraocular pressure; BRVO Branch retinal vein occlusion; CRVO central retinal vein occlusion; CRAO central retinal artery occlusion; BRAO branch retinal artery occlusion; RT retinal tear; SB scleral buckle; PPV pars plana vitrectomy; VH Vitreous hemorrhage; PRP panretinal laser photocoagulation; IVK intravitreal kenalog; VMT vitreomacular traction; MH Macular hole;  NVD neovascularization of the disc; NVE neovascularization elsewhere; AREDS age related eye disease study; ARMD age related macular degeneration; POAG primary open angle glaucoma; EBMD epithelial/anterior basement membrane dystrophy; ACIOL anterior chamber intraocular lens; IOL intraocular lens; PCIOL posterior chamber intraocular lens; Phaco/IOL phacoemulsification with intraocular lens placement; PRK photorefractive keratectomy; LASIK laser assisted in situ keratomileusis; HTN hypertension; DM diabetes mellitus; COPD chronic obstructive pulmonary disease

## 2023-06-13 ENCOUNTER — Encounter: Payer: Self-pay | Admitting: Cardiology

## 2023-06-13 ENCOUNTER — Encounter (INDEPENDENT_AMBULATORY_CARE_PROVIDER_SITE_OTHER): Payer: Self-pay | Admitting: Ophthalmology

## 2023-06-13 ENCOUNTER — Ambulatory Visit: Payer: Medicare PPO | Attending: Cardiology | Admitting: Cardiology

## 2023-06-13 ENCOUNTER — Ambulatory Visit (INDEPENDENT_AMBULATORY_CARE_PROVIDER_SITE_OTHER): Payer: Medicare PPO | Admitting: Ophthalmology

## 2023-06-13 VITALS — BP 124/64 | HR 44

## 2023-06-13 DIAGNOSIS — H34833 Tributary (branch) retinal vein occlusion, bilateral, with macular edema: Secondary | ICD-10-CM | POA: Diagnosis not present

## 2023-06-13 DIAGNOSIS — H25813 Combined forms of age-related cataract, bilateral: Secondary | ICD-10-CM

## 2023-06-13 DIAGNOSIS — R001 Bradycardia, unspecified: Secondary | ICD-10-CM

## 2023-06-13 DIAGNOSIS — I10 Essential (primary) hypertension: Secondary | ICD-10-CM

## 2023-06-13 DIAGNOSIS — G4733 Obstructive sleep apnea (adult) (pediatric): Secondary | ICD-10-CM | POA: Diagnosis not present

## 2023-06-13 DIAGNOSIS — H35033 Hypertensive retinopathy, bilateral: Secondary | ICD-10-CM

## 2023-06-13 DIAGNOSIS — H3509 Other intraretinal microvascular abnormalities: Secondary | ICD-10-CM

## 2023-06-13 MED ORDER — BEVACIZUMAB CHEMO INJECTION 1.25MG/0.05ML SYRINGE FOR KALEIDOSCOPE
1.2500 mg | INTRAVITREAL | Status: AC | PRN
  Administered 2023-06-13: 1.25 mg via INTRAVITREAL

## 2023-06-13 MED ORDER — AFLIBERCEPT 2MG/0.05ML IZ SOLN FOR KALEIDOSCOPE
2.0000 mg | INTRAVITREAL | Status: AC | PRN
  Administered 2023-06-13: 2 mg via INTRAVITREAL

## 2023-06-13 NOTE — Progress Notes (Signed)
Cardiology Office Note:    Date:  06/13/2023   ID:  Shelley Mitchell, DOB [birth year], MRN [id number]  PCP:  Katherina Right, MD   [facility]  Cardiologist:  Debbe Odea, MD  Advanced Practice Provider:  No care team member to display Electrophysiologist:  None       Referring MD: Katherina Right, MD   Chief Complaint  Patient presents with   Follow-up    Patient denies new or acute cardiac problems/concerns today.  Med reviewed via [facility] from [facility] facility.  Not able to receive Semaglutide due to insurance denial.  Unable to obtain wt or ht today due to wheelchair bound.    History of Present Illness:    Shelley Mitchell is a 82 y.o. female with a hx of hypertension, hyperlipidemia, bradycardia, OSA, presenting for follow-up.  Previously seen for hypertension after stopping clonidine.  Losartan was increased.  Blood pressures seem well-controlled.  WGNFAO prescribed for weight loss not approved by insurance.  Diagnosed with sleep apnea in the past after undergoing sleep study.  Was prescribed CPAP, but apparently this cannot be accommodated in her current skilled nursing facility.  Endorse fatigue, lack of energy.  Prior notes Cardiac monitor 3/24 showed average heart rate heart rates in the 50s, no high degree AV block noted. Echo 12/2022 EF 60 to 65%, aortic valve sclerosis, impaired relaxation.   Past Medical History:  Diagnosis Date   Endometrial polyp    Dr. Despina Hidden   History of endometrial hyperplasia    HTN (hypertension)    Hx of adenomatous colonic polyps 1999   Hyperlipidemia    Mental disorder    GAD   Obesity    Osteoarthritis    Tachycardia    subsequently with bradycardia (?secondary to meds)    Past Surgical History:  Procedure Laterality Date   BACK SURGERY     bilateral knee replacements  2002/2003   CHOLECYSTECTOMY     COLONOSCOPY  03/2002   Dr. Fleet Contras internal hemorrhoids   COLONOSCOPY  06/21/2011    polyp at IC valve (tubular adenoma)   COLONOSCOPY N/A 07/15/2016   one 8 mm polyp at hepatic flexure (tubular adenoma), on 12 mm polyp at hepatic flexure (tubular adenoma), 3 year surveillance if health permits   COLONOSCOPY N/A 10/10/2019   descending colon diverticulosis, two 4-5 mm polyps at splenic flexure (tubular adenomas). No surveillance due to age.    LUMBAR LAMINECTOMY/DECOMPRESSION MICRODISCECTOMY N/A 04/27/2022   Procedure: Thoracic Eleven-Twelve Decompression;  Surgeon: Coletta Memos, MD;  Location: [facility] OR;  Service: Neurosurgery;  Laterality: N/A;   POLYPECTOMY  07/15/2016   Procedure: POLYPECTOMY;  Surgeon: Corbin Ade, MD;  Location: [facility];  Service: Endoscopy;;  Splenic Flexure polyps x 2 removed via hot snare   POLYPECTOMY  10/10/2019   Procedure: POLYPECTOMY;  Surgeon: Corbin Ade, MD;  Location: [facility];  Service: Endoscopy;;   TUBAL LIGATION      Current Medications: Current Meds  Medication Sig   acetaminophen (TYLENOL) 500 MG tablet Take 1,000 mg by mouth every 12 (twelve) hours.   albuterol (VENTOLIN HFA) 108 (90 Base) MCG/ACT inhaler Inhale 2 puffs into the lungs every 8 (eight) hours as needed for wheezing or shortness of breath.   Amino Acids-Protein Hydrolys (PRO-STAT AWC) LIQD Take 30 mLs by mouth 2 (two) times daily between meals.   amLODipine (NORVASC) 5 MG tablet Take 1 tablet (5 mg total) by mouth daily.  aspirin EC 81 MG tablet Take 81 mg by mouth daily. Swallow whole.   furosemide (LASIX) 20 MG tablet Take 20 mg by mouth daily.   latanoprost (XALATAN) 0.005 % ophthalmic solution Place 1 drop into both eyes at bedtime.   levothyroxine (SYNTHROID) 25 MCG tablet Take 25 mcg by mouth daily before breakfast.   losartan (COZAAR) 50 MG tablet Take 1 tablet (50 mg total) by mouth daily.   lubiprostone (AMITIZA) 8 MCG capsule Take 1 capsule (8 mcg total) by mouth daily with breakfast.   medroxyPROGESTERone (PROVERA) 10 MG tablet TAKE 1 TABLET BY  MOUTH DAILY 10 DAYS PER MONTH (Patient taking differently: Take 10 mg by mouth See admin instructions. Take 10 mg once daily on the 1st-10th day of each month)   Multiple Vitamins-Minerals (MULTIVITAMIN WITH MINERALS) tablet Take 1 tablet by mouth daily.   nystatin powder Apply 1 Application topically 3 (three) times daily.   polyethylene glycol powder (GLYCOLAX/MIRALAX) 17 GM/SCOOP powder Take 17 g by mouth daily.   pravastatin (PRAVACHOL) 40 MG tablet Take 40 mg by mouth at bedtime.   sertraline (ZOLOFT) 100 MG tablet Take 100 mg by mouth daily.     Allergies:   Ace inhibitors and Nsaids   Social History   Socioeconomic History   Marital status: Married    Spouse name: Not on file   Number of children: 4   Years of education: Not on file   Highest education level: Not on file  Occupational History    Employer: RETIRED  Tobacco Use   Smoking status: Never   Smokeless tobacco: Never  Vaping Use   Vaping Use: Never used  Substance and Sexual Activity   Alcohol use: No    Alcohol/week: 0.0 standard drinks of alcohol   Drug use: No   Sexual activity: Not Currently    Birth control/protection: Post-menopausal  Other Topics Concern   Not on file  Social History Narrative   Not on file   Social Determinants of Health   Financial Resource Strain: Not on file  Food Insecurity: No Food Insecurity (12/26/2022)   Hunger Vital Sign    Worried About Running Out of Food in the Last Year: Never true    Ran Out of Food in the Last Year: Never true  Transportation Needs: No Transportation Needs (12/26/2022)   PRAPARE - Administrator, Civil Service (Medical): No    Lack of Transportation (Non-Medical): No  Physical Activity: Not on file  Stress: Not on file  Social Connections: Not on file     Family History: The patient's family history includes Cancer in her father; Colon polyps in her brother; Diabetes in her brother and sister; Stroke (age of onset: 20) in her mother.  There is no history of Colon cancer.  ROS:   Please see the history of present illness.     All other systems reviewed and are negative.  EKGs/Labs/Other Studies Reviewed:    The following studies were reviewed today:   EKG:  EKG not  ordered today.    Recent Labs: 12/23/2022: TSH 2.994 12/26/2022: Magnesium 2.0 12/27/2022: Hemoglobin 11.2; Platelets 187 01/10/2023: BUN 18; Creatinine, Ser 0.57; Potassium 4.0; Sodium 137  Recent Lipid Panel    Component Value Date/Time   CHOL 160 04/24/2022 0341   TRIG 83 04/24/2022 0341   HDL 67 04/24/2022 0341   CHOLHDL 2.4 04/24/2022 0341   VLDL 17 04/24/2022 0341   LDLCALC 76 04/24/2022 0341     Risk  Assessment/Calculations:      Physical Exam:    VS:  BP 124/64 (BP Location: Left Arm, Patient Position: Sitting, Cuff Size: Large)   Pulse (!) 44   SpO2 95%     Wt Readings from Last 3 Encounters:  03/14/23 256 lb (116.1 kg)  01/10/23 249 lb 14.4 oz (113.4 kg)  12/27/22 249 lb 9 oz (113.2 kg)     GEN:  Well nourished, well developed in no acute distress HEENT: Normal NECK: No JVD; No carotid bruits CARDIAC: Bradycardic, regular, 2/6 systolic murmur RESPIRATORY:  Clear to auscultation without rales, wheezing or rhonchi  ABDOMEN: Soft, non-tender, non-distended MUSCULOSKELETAL:  1+ edema; No deformity  SKIN: Warm and dry NEUROLOGIC:  Alert and oriented x 3 PSYCHIATRIC:  Normal affect   ASSESSMENT:    1. Primary hypertension   2. Morbid obesity (HCC)   3. Bradycardia   4. OSA (obstructive sleep apnea)    PLAN:    In order of problems listed above:  Hypertension, controlled.  Continue losartan 50 mg daily, Norvasc 5 mg daily. Morbid obesity, low-calorie diet, Wegovy not covered by insurance. Bradycardia, asymptomatic, prior cardiac monitor with no high degree AV block.  No indication for pacemaker.  Patient is wheelchair-bound. History of sleep apnea, follow-up with sleep specialist for CPAP prescription.  Should check  with SNF regarding CPAP accommodation.  Follow-up in 1 year.   Medication Adjustments/Labs and Tests Ordered: Current medicines are reviewed at length with the patient today.  Concerns regarding medicines are outlined above.  Orders Placed This Encounter  Procedures   EKG 12-Lead   No orders of the defined types were placed in this encounter.   Patient Instructions  Medication Instructions:   Your physician recommends that you continue on your current medications as directed. Please refer to the Current Medication list given to you today.  *If you need a refill on your cardiac medications before your next appointment, please call your pharmacy*   Lab Work:  None Ordered  If you have labs (blood work) drawn today and your tests are completely normal, you will receive your results only by: MyChart Message (if you have MyChart) OR A paper copy in the mail If you have any lab test that is abnormal or we need to change your treatment, we will call you to review the results.   Testing/Procedures:  None Ordered    Follow-Up: At [facility], you and your health needs are our priority.  As part of our continuing mission to provide you with exceptional heart care, we have created designated Provider Care Teams.  These Care Teams include your primary Cardiologist (physician) and Advanced Practice Providers (APPs -  Physician Assistants and Nurse Practitioners) who all work together to provide you with the care you need, when you need it.  We recommend signing up for the patient portal called "MyChart".  Sign up information is provided on this After Visit Summary.  MyChart is used to connect with patients for Virtual Visits (Telemedicine).  Patients are able to view lab/test results, encounter notes, upcoming appointments, etc.  Non-urgent messages can be sent to your provider as well.   To learn more about what you can do with MyChart, go to [email or web address].    Your  next appointment:   12 months   Provider:   You may see Debbe Odea, MD or one of the following Advanced Practice Providers on your designated Care Team:   Nicolasa Ducking, NP Eula Listen, PA-C Cadence Fransico Michael, [place]  Charlsie Quest, NP   Signed, Debbe Odea, MD  06/13/2023 2:47 PM    [facility]

## 2023-06-13 NOTE — Patient Instructions (Signed)
Medication Instructions:   Your physician recommends that you continue on your current medications as directed. Please refer to the Current Medication list given to you today.  *If you need a refill on your cardiac medications before your next appointment, please call your pharmacy*   Lab Work:  None Ordered  If you have labs (blood work) drawn today and your tests are completely normal, you will receive your results only by: MyChart Message (if you have MyChart) OR A paper copy in the mail If you have any lab test that is abnormal or we need to change your treatment, we will call you to review the results.   Testing/Procedures:  None Ordered    Follow-Up: At [facility], you and your health needs are our priority.  As part of our continuing mission to provide you with exceptional heart care, we have created designated Provider Care Teams.  These Care Teams include your primary Cardiologist (physician) and Advanced Practice Providers (APPs -  Physician Assistants and Nurse Practitioners) who all work together to provide you with the care you need, when you need it.  We recommend signing up for the patient portal called "MyChart".  Sign up information is provided on this After Visit Summary.  MyChart is used to connect with patients for Virtual Visits (Telemedicine).  Patients are able to view lab/test results, encounter notes, upcoming appointments, etc.  Non-urgent messages can be sent to your provider as well.   To learn more about what you can do with MyChart, go to [email or web address].    Your next appointment:   12 month(s)  Provider:   You may see Brian Agbor-Etang, MD or one of the following Advanced Practice Providers on your designated Care Team:   Christopher Berge, NP Ryan Dunn, PA-C Cadence Furth, PA-C Sheri Hammock, NP  

## 2023-07-13 NOTE — Progress Notes (Signed)
[facility] - Clinic Note  07/15/2023     CHIEF COMPLAINT Patient presents for Retina Follow Up   HISTORY OF PRESENT ILLNESS: Shelley Mitchell is a 82 y.o. female who presents to the clinic today for:   HPI     Retina Follow Up   Patient presents with  CRVO/BRVO.  In left eye.  This started 4 weeks ago.  Severity is moderate.  Duration of 4 weeks.  Since onset it is stable.  I, the attending physician,  performed the HPI with the patient and updated documentation appropriately.        Comments   4 week retina follow BRVO OS and I'VE OS pt os reporting no vision changes noticed she has not noticed any flashes or floaters       Last edited by Rennis Chris, MD on 07/15/2023  4:23 PM.     Pt feels like her vision is better  Referring physician: Mateo Flow, MD [address] [place],  [place] [address]  HISTORICAL INFORMATION:   Selected notes from the MEDICAL RECORD NUMBER Referred by Dr. Elmer Picker for concern of BDR / PDR LEE:  Ocular Hx- PMH-    CURRENT MEDICATIONS: Current Outpatient Medications (Ophthalmic Drugs)  Medication Sig   latanoprost (XALATAN) 0.005 % ophthalmic solution Place 1 drop into both eyes at bedtime.   No current facility-administered medications for this visit. (Ophthalmic Drugs)   Current Outpatient Medications (Other)  Medication Sig   acetaminophen (TYLENOL) 500 MG tablet Take 1,000 mg by mouth every 12 (twelve) hours.   albuterol (VENTOLIN HFA) 108 (90 Base) MCG/ACT inhaler Inhale 2 puffs into the lungs every 8 (eight) hours as needed for wheezing or shortness of breath.   Amino Acids-Protein Hydrolys (PRO-STAT AWC) LIQD Take 30 mLs by mouth 2 (two) times daily between meals.   amLODipine (NORVASC) 5 MG tablet Take 1 tablet (5 mg total) by mouth daily.   aspirin EC 81 MG tablet Take 81 mg by mouth daily. Swallow whole.   furosemide (LASIX) 20 MG tablet Take 20 mg by mouth daily.   levothyroxine (SYNTHROID) 25 MCG  tablet Take 25 mcg by mouth daily before breakfast.   losartan (COZAAR) 50 MG tablet Take 1 tablet (50 mg total) by mouth daily.   lubiprostone (AMITIZA) 8 MCG capsule Take 1 capsule (8 mcg total) by mouth daily with breakfast.   medroxyPROGESTERone (PROVERA) 10 MG tablet TAKE 1 TABLET BY MOUTH DAILY 10 DAYS PER MONTH (Patient taking differently: Take 10 mg by mouth See admin instructions. Take 10 mg once daily on the 1st-10th day of each month)   Multiple Vitamins-Minerals (MULTIVITAMIN WITH MINERALS) tablet Take 1 tablet by mouth daily.   nystatin powder Apply 1 Application topically 3 (three) times daily.   polyethylene glycol powder (GLYCOLAX/MIRALAX) 17 GM/SCOOP powder Take 17 g by mouth daily.   pravastatin (PRAVACHOL) 40 MG tablet Take 40 mg by mouth at bedtime.   sertraline (ZOLOFT) 100 MG tablet Take 100 mg by mouth daily.   No current facility-administered medications for this visit. (Other)   REVIEW OF SYSTEMS: ROS   Positive for: Musculoskeletal, Cardiovascular, Eyes, Respiratory Last edited by Etheleen Mayhew, COT on 07/15/2023  9:58 AM.     ALLERGIES Allergies  Allergen Reactions   Ace Inhibitors Itching   Nsaids Itching   PAST MEDICAL HISTORY Past Medical History:  Diagnosis Date   Endometrial polyp    Dr. Despina Hidden   History of endometrial hyperplasia  HTN (hypertension)    Hx of adenomatous colonic polyps 1999   Hyperlipidemia    Mental disorder    GAD   Obesity    Osteoarthritis    Tachycardia    subsequently with bradycardia (?secondary to meds)   Past Surgical History:  Procedure Laterality Date   BACK SURGERY     bilateral knee replacements  2002/2003   CHOLECYSTECTOMY     COLONOSCOPY  03/2002   Dr. Fleet Contras internal hemorrhoids   COLONOSCOPY  06/21/2011   polyp at IC valve (tubular adenoma)   COLONOSCOPY N/A 07/15/2016   one 8 mm polyp at hepatic flexure (tubular adenoma), on 12 mm polyp at hepatic flexure (tubular adenoma), 3 year  surveillance if health permits   COLONOSCOPY N/A 10/10/2019   descending colon diverticulosis, two 4-5 mm polyps at splenic flexure (tubular adenomas). No surveillance due to age.    LUMBAR LAMINECTOMY/DECOMPRESSION MICRODISCECTOMY N/A 04/27/2022   Procedure: Thoracic Eleven-Twelve Decompression;  Surgeon: Coletta Memos, MD;  Location: [facility];  Service: Neurosurgery;  Laterality: N/A;   POLYPECTOMY  07/15/2016   Procedure: POLYPECTOMY;  Surgeon: Corbin Ade, MD;  Location: [facility];  Service: Endoscopy;;  Splenic Flexure polyps x 2 removed via hot snare   POLYPECTOMY  10/10/2019   Procedure: POLYPECTOMY;  Surgeon: Corbin Ade, MD;  Location: [facility];  Service: Endoscopy;;   TUBAL LIGATION     FAMILY HISTORY Family History  Problem Relation Age of Onset   Diabetes Brother    Colon polyps Brother    Diabetes Sister    Cancer Father        Likely prostate   Stroke Mother 87   Colon cancer Neg Hx    SOCIAL HISTORY Social History   Tobacco Use   Smoking status: Never   Smokeless tobacco: Never  Vaping Use   Vaping status: Never Used  Substance Use Topics   Alcohol use: No    Alcohol/week: 0.0 standard drinks of alcohol   Drug use: No       OPHTHALMIC EXAM:  Base Eye Exam     Visual Acuity (Snellen - Linear)       Right Left   Dist cc 20/50 -2 20/200   Dist ph cc NI NI    Correction: Glasses         Tonometry (Tonopen, 10:03 AM)       Right Left   Pressure 10 9         Pupils       Pupils Dark Light Shape React APD   Right PERRL 3 2 Round Brisk None   Left PERRL 3 2 Round Brisk None         Visual Fields       Left Right    Full Full         Extraocular Movement       Right Left    Full, Ortho Full, Ortho         Neuro/Psych     Oriented x3: Yes   Mood/Affect: Normal         Dilation     Both eyes: 2.5% Phenylephrine @ 10:03 AM           Slit Lamp and Fundus Exam     Slit Lamp Exam       Right Left    Lids/Lashes Dermatochalasis - upper lid Dermatochalasis - upper lid   Conjunctiva/Sclera mild melanosis mild melanosis   Cornea arcus arcus  Anterior Chamber deep and clear deep and clear   Iris Round and dilated Round and dilated   Lens 2+ Nuclear sclerosis, 2+ Cortical cataract 2-3+ Nuclear sclerosis, 2-3+ Cortical cataract   Anterior Vitreous Vitreous syneresis, Posterior vitreous detachment, vitreous condensations Vitreous syneresis, Posterior vitreous detachment         Fundus Exam       Right Left   Disc nasal hyperemia -- improved, sharp rim, PPA, mild temporal pallor, peripapillary subretinal heme superior and nasal to disc -- resolved Pink and Sharp, mild tilt, +PPA   C/D Ratio 0.3 0.3   Macula Flat, good foveal reflex, inferior macula atrophic, RPE mottling and clumping, subretinal heme and edema superior macula -- slightly improved Blunted foveal reflex, central and inferior IRH and exudates -- improved, central cystic changes -- persistent, retinal macroanuerysms inferior macula -- improved, scattered DBH -- improved, punctate exudates centrally -- improved, mild ERM   Vessels attenuated, Tortuous, macroanuerysms with heme along ST arcades -- fibrosing and turning white attenuated, Tortuous, retinal macroaneurysms along IT arcades--improved   Periphery Attached, peripapillary subretinal heme superior and nasal to disc -- resolved Attached, scattered IRH--improved/essentially resolved           Refraction     Wearing Rx       Sphere Cylinder Axis Add   Right -1.25 +0.50 163 +2.00   Left -0.75 +0.50 153 +2.00           IMAGING AND PROCEDURES  Imaging and Procedures for 07/15/2023  OCT, Retina - OU - Both Eyes       Right Eye Quality was good. Central Foveal Thickness: 218. Progression has improved. Findings include normal foveal contour, no SRF, myopic contour, intraretinal hyper-reflective material, intraretinal fluid, subretinal fluid, inner retinal atrophy  (Interval improvement IRF/IRHM (macroanuerysm) along ST arcades, Diffuse IRA inferior macula / hemisphere -- remote BRAO; stable improvement in disc edema).   Left Eye Quality was good. Central Foveal Thickness: 395. Progression has improved. Findings include abnormal foveal contour, myopic contour, retinal drusen , subretinal hyper-reflective material, intraretinal hyper-reflective material, intraretinal fluid, outer retinal atrophy (Mild interval improvement in disc elevation, central ORA, drusen, SRHM, persistent IRF/IRHM temporal macula -- slightly improved).   Notes *Images captured and stored on drive  Diagnosis / Impression:  OD: Interval improvement IRF/IRHM (macroanuerysm) along ST arcades, Diffuse IRA inferior macula / hemisphere -- remote BRAO; stable improvement in disc edema OS: Mild interval improvement in disc elevation, central ORA, drusen, SRHM, persistent IRF/IRHM temporal macula -- slightly improved  Clinical management:  See below  Abbreviations: NFP - Normal foveal profile. CME - cystoid macular edema. PED - pigment epithelial detachment. IRF - intraretinal fluid. SRF - subretinal fluid. EZ - ellipsoid zone. ERM - epiretinal membrane. ORA - outer retinal atrophy. ORT - outer retinal tubulation. SRHM - subretinal hyper-reflective material. IRHM - intraretinal hyper-reflective material      Intravitreal Injection, Pharmacologic Agent - OD - Right Eye       Time Out 07/15/2023. 11:03 AM. Confirmed correct patient, procedure, site, and patient consented.   Anesthesia Topical anesthesia was used. Anesthetic medications included Lidocaine 2%, Proparacaine 0.5%.   Procedure Preparation included 5% betadine to ocular surface, eyelid speculum. A (33g) needle was used.   Injection: 1.25 mg Bevacizumab 1.25mg /0.21ml   Route: Intravitreal, Site: Right Eye   NDC: P3213405, Lot: [id number], Expiration date: 03/28/2024   Post-op Post injection exam found visual acuity of at  least counting fingers. The patient tolerated the procedure well. There were  no complications. The patient received written and verbal post procedure care education. Post injection medications were not given.      Intravitreal Injection, Pharmacologic Agent - OS - Left Eye       Time Out 07/15/2023. 11:04 AM. Confirmed correct patient, procedure, site, and patient consented.   Anesthesia Topical anesthesia was used. Anesthetic medications included Lidocaine 2%, Proparacaine 0.5%.   Procedure Preparation included 5% betadine to ocular surface, eyelid speculum. A (33g) needle was used.   Injection: 2 mg aflibercept 2 MG/0.05ML   Route: Intravitreal, Site: Left Eye   NDC: L6038910, Lot: [id number], Expiration date: 03/05/2024, Waste: 0 mL   Post-op Post injection exam found visual acuity of at least counting fingers. The patient tolerated the procedure well. There were no complications. The patient received written and verbal post procedure care education. Post injection medications were not given.            ASSESSMENT/PLAN:    ICD-10-CM   1. 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 Essential hypertension  I10     4. Hypertensive retinopathy of both eyes  H35.033     5. Combined forms of age-related cataract of both eyes  H25.813      1,2. BRVO w/ CME and retinal macronauerysm OS - s/p IVA OS #1 (12.15.23) #2 (01.12.24), #3 (02.09.24), #4 (03.15.24), #5 (04.12.24), #6 (06.07.24) - s/p IVE OS #1 (05.10.24), #2 (07.08.24) - pt previously followed with Dr. Fayrene Fearing in [place], [place], but now lives in a facility in [place] and cannot be transported to [facility] - history of laser  photoablation of retinal macroaneurysm OS per chart review - FA 12.15.23 shows focal MAs w/ late leakage - BCVA OS 20/200 - decreased - exam shows macroaneurysm with surrounding circinate exudates and edema, inferior macula OS -- improved - OCT shows OS: Mild interval improvement in disc elevation, central ORA, drusen, SRHM, persistent IRF/IRHM temporal macula -- slightly improved - recommend IVE OS #3 today, 08.09.24 - RBA of procedure discussed, questions answered - IVE informed consent obtained and signed, 06.07.24 (OU) - see procedure note - f/u 4 weeks-- DFE/OCT/possible injection  1,2. BRVO  w/ CME and retinal macroaneurysm OD  - s/p IVA OD #1 (06.07.24) #2 (07.08.24) - OCT shows OD: Interval improvement IRF/IRHM (macroanuerysm) along ST arcades, Diffuse IRA inferior macula / hemisphere -- remote BRAO; stable improvement in disc edema   - exam shows peripapillary hemorrhage, mild NVD nasal disc -- improving + new macroaneurysm w/ surrounding heme and edema along ST arcades  - FA 12.15.23 shows mild NVD nasal disc w/ leakage  - pt may benefit from PRP and anti-VEGF therapy  - BCVA OD 20/50 from 20/40  - recommend IVA OD #3 today, 08.09.24, for macular edema from BRVO  - pt wishes to proceed with injection  - RBA of procedure discussed, questions answered - see procedure note - f/u 4 weeks, DFE, OCT  3,4. Hypertensive retinopathy OU  - pt and son report recent changes in BP meds - discussed importance of tight BP control and likely relationship with BRVOs  5. Mixed Cataract OU - The symptoms  of cataract, surgical options, and treatments and risks were discussed with patient. - discussed diagnosis and progression - likely visually significant - under the expert management of Dr. Elmer Picker - clear from a retina standpoint to proceed with cataract surgery when pt and surgeon are ready  - would recommend timing cataract surgery ~1-2 wks after anti-VEGF injection  Ophthalmic Meds  Ordered this visit:  Meds ordered this encounter  Medications   Bevacizumab (AVASTIN) SOLN 1.25 mg   aflibercept (EYLEA) SOLN 2 mg     Return in about 4 weeks (around 08/12/2023) for f/u BRVO OU, DFE, OCT.  There are no Patient Instructions on file for this visit.  This document serves as a record of services personally performed by Karie Chimera, MD, PhD. It was created on their behalf by Berlin Hun COT, an ophthalmic technician. The creation of this record is the provider's dictation and/or activities during the visit.    Electronically signed by: Berlin Hun COT TODAY 08.07.24 4:25 PM  This document serves as a record of services personally performed by Karie Chimera, MD, PhD. It was created on their behalf by Glee Arvin. Manson Passey, OA an ophthalmic technician. The creation of this record is the provider's dictation and/or activities during the visit.    Electronically signed by: Glee Arvin. Manson Passey, OA 07/15/23 4:25 PM  Karie Chimera, M.D., Ph.D. Diseases & Surgery of the Retina and Vitreous [facility] 07/15/2023   I have reviewed the above documentation for accuracy and completeness, and I agree with the above. Karie Chimera, M.D., Ph.D. 07/16/23 1:46 AM   Abbreviations: M myopia (nearsighted); A astigmatism; H hyperopia (farsighted); P presbyopia; Mrx spectacle prescription;  CTL contact lenses; OD right eye; OS left eye; OU both eyes  XT exotropia; ET esotropia; PEK punctate epithelial keratitis; PEE punctate epithelial erosions; DES dry eye syndrome; MGD meibomian gland dysfunction; ATs artificial tears; PFAT's preservative free artificial tears; NSC nuclear sclerotic cataract; PSC posterior subcapsular cataract; ERM epi-retinal membrane; PVD posterior vitreous detachment; RD retinal detachment; DM diabetes mellitus; DR diabetic retinopathy; NPDR non-proliferative diabetic retinopathy; PDR proliferative diabetic retinopathy; CSME clinically  significant macular edema; DME diabetic macular edema; dbh dot blot hemorrhages; CWS cotton wool spot; POAG primary open angle glaucoma; C/D cup-to-disc ratio; HVF humphrey visual field; GVF goldmann visual field; OCT optical coherence tomography; IOP intraocular pressure; BRVO Branch retinal vein occlusion; CRVO central retinal vein occlusion; CRAO central retinal artery occlusion; BRAO branch retinal artery occlusion; RT retinal tear; SB scleral buckle; PPV pars plana vitrectomy; VH Vitreous hemorrhage; PRP panretinal laser photocoagulation; IVK intravitreal kenalog; VMT vitreomacular traction; MH Macular hole;  NVD neovascularization of the disc; NVE neovascularization elsewhere; AREDS age related eye disease study; ARMD age related macular degeneration; POAG primary open angle glaucoma; EBMD epithelial/anterior basement membrane dystrophy; ACIOL anterior chamber intraocular lens; IOL intraocular lens; PCIOL posterior chamber intraocular lens; Phaco/IOL phacoemulsification with intraocular lens placement; PRK photorefractive keratectomy; LASIK laser assisted in situ keratomileusis; HTN hypertension; DM diabetes mellitus; COPD chronic obstructive pulmonary disease

## 2023-07-15 ENCOUNTER — Ambulatory Visit (INDEPENDENT_AMBULATORY_CARE_PROVIDER_SITE_OTHER): Payer: Medicare PPO | Admitting: Ophthalmology

## 2023-07-15 ENCOUNTER — Encounter (INDEPENDENT_AMBULATORY_CARE_PROVIDER_SITE_OTHER): Payer: Self-pay | Admitting: Ophthalmology

## 2023-07-15 DIAGNOSIS — H3509 Other intraretinal microvascular abnormalities: Secondary | ICD-10-CM

## 2023-07-15 DIAGNOSIS — I10 Essential (primary) hypertension: Secondary | ICD-10-CM | POA: Diagnosis not present

## 2023-07-15 DIAGNOSIS — H35033 Hypertensive retinopathy, bilateral: Secondary | ICD-10-CM

## 2023-07-15 DIAGNOSIS — H34833 Tributary (branch) retinal vein occlusion, bilateral, with macular edema: Secondary | ICD-10-CM | POA: Diagnosis not present

## 2023-07-15 DIAGNOSIS — H25813 Combined forms of age-related cataract, bilateral: Secondary | ICD-10-CM

## 2023-07-15 MED ORDER — AFLIBERCEPT 2MG/0.05ML IZ SOLN FOR KALEIDOSCOPE
2.0000 mg | INTRAVITREAL | Status: AC | PRN
Start: 2023-07-15 — End: 2023-07-15
  Administered 2023-07-15: 2 mg via INTRAVITREAL

## 2023-07-15 MED ORDER — BEVACIZUMAB CHEMO INJECTION 1.25MG/0.05ML SYRINGE FOR KALEIDOSCOPE
1.2500 mg | INTRAVITREAL | Status: AC | PRN
Start: 2023-07-15 — End: 2023-07-15
  Administered 2023-07-15: 1.25 mg via INTRAVITREAL

## 2023-08-12 ENCOUNTER — Encounter (INDEPENDENT_AMBULATORY_CARE_PROVIDER_SITE_OTHER): Payer: Self-pay | Admitting: Ophthalmology

## 2023-08-12 ENCOUNTER — Ambulatory Visit (INDEPENDENT_AMBULATORY_CARE_PROVIDER_SITE_OTHER): Payer: Medicare PPO | Admitting: Ophthalmology

## 2023-08-12 DIAGNOSIS — H34833 Tributary (branch) retinal vein occlusion, bilateral, with macular edema: Secondary | ICD-10-CM

## 2023-08-12 DIAGNOSIS — I10 Essential (primary) hypertension: Secondary | ICD-10-CM | POA: Diagnosis not present

## 2023-08-12 DIAGNOSIS — H35033 Hypertensive retinopathy, bilateral: Secondary | ICD-10-CM

## 2023-08-12 DIAGNOSIS — H3509 Other intraretinal microvascular abnormalities: Secondary | ICD-10-CM

## 2023-08-12 DIAGNOSIS — H25813 Combined forms of age-related cataract, bilateral: Secondary | ICD-10-CM

## 2023-08-12 NOTE — Progress Notes (Signed)
[facility] - Clinic Note  08/12/2023     CHIEF COMPLAINT Patient presents for Retina Follow Up   HISTORY OF PRESENT ILLNESS: Shelley Mitchell is a 82 y.o. female who presents to the clinic today for:   HPI     Retina Follow Up   Patient presents with  CRVO/BRVO.  In left eye.  This started 4 weeks ago.  Duration of 4 weeks.  Since onset it is gradually worsening.        Comments   4 week retina follow up BRVO and  I'VE OS pt is reporting that her vision is not as sharp in her left eye and she is having some pain outer edge of right eye she denies flashes or floaters       Last edited by Etheleen Mayhew, COT on 08/12/2023  9:53 AM.      Pt feels like her vision is the same. She states that she just can see good. She is experiencing pain on the side of her head that moves to her eyes and her BP goes up. She states that these episodes of pain happen at random times.   Referring physician: Mateo Flow, MD [address] [place],  [place] [address]  HISTORICAL INFORMATION:   Selected notes from the MEDICAL RECORD NUMBER Referred by Dr. Elmer Picker for concern of BDR / PDR LEE:  Ocular Hx- PMH-    CURRENT MEDICATIONS: Current Outpatient Medications (Ophthalmic Drugs)  Medication Sig   latanoprost (XALATAN) 0.005 % ophthalmic solution Place 1 drop into both eyes at bedtime.   No current facility-administered medications for this visit. (Ophthalmic Drugs)   Current Outpatient Medications (Other)  Medication Sig   acetaminophen (TYLENOL) 500 MG tablet Take 1,000 mg by mouth every 12 (twelve) hours.   albuterol (VENTOLIN HFA) 108 (90 Base) MCG/ACT inhaler Inhale 2 puffs into the lungs every 8 (eight) hours as needed for wheezing or shortness of breath.   Amino Acids-Protein Hydrolys (PRO-STAT AWC) LIQD Take 30 mLs by mouth 2 (two) times daily between meals.   amLODipine (NORVASC) 5 MG tablet Take 1 tablet (5 mg total) by mouth daily.   aspirin EC  81 MG tablet Take 81 mg by mouth daily. Swallow whole.   furosemide (LASIX) 20 MG tablet Take 20 mg by mouth daily.   levothyroxine (SYNTHROID) 25 MCG tablet Take 25 mcg by mouth daily before breakfast.   losartan (COZAAR) 50 MG tablet Take 1 tablet (50 mg total) by mouth daily.   lubiprostone (AMITIZA) 8 MCG capsule Take 1 capsule (8 mcg total) by mouth daily with breakfast.   medroxyPROGESTERone (PROVERA) 10 MG tablet TAKE 1 TABLET BY MOUTH DAILY 10 DAYS PER MONTH (Patient taking differently: Take 10 mg by mouth See admin instructions. Take 10 mg once daily on the 1st-10th day of each month)   Multiple Vitamins-Minerals (MULTIVITAMIN WITH MINERALS) tablet Take 1 tablet by mouth daily.   nystatin powder Apply 1 Application topically 3 (three) times daily.   polyethylene glycol powder (GLYCOLAX/MIRALAX) 17 GM/SCOOP powder Take 17 g by mouth daily.   pravastatin (PRAVACHOL) 40 MG tablet Take 40 mg by mouth at bedtime.   sertraline (ZOLOFT) 100 MG tablet Take 100 mg by mouth daily.   No current facility-administered medications for this visit. (Other)   REVIEW OF SYSTEMS: ROS   Positive for: Musculoskeletal, Cardiovascular, Eyes, Respiratory Last edited by Etheleen Mayhew, COT on 08/12/2023  9:53 AM.  ALLERGIES Allergies  Allergen Reactions   Ace Inhibitors Itching   Nsaids Itching   PAST MEDICAL HISTORY Past Medical History:  Diagnosis Date   Endometrial polyp    Dr. Despina Hidden   History of endometrial hyperplasia    HTN (hypertension)    Hx of adenomatous colonic polyps 1999   Hyperlipidemia    Mental disorder    GAD   Obesity    Osteoarthritis    Tachycardia    subsequently with bradycardia (?secondary to meds)   Past Surgical History:  Procedure Laterality Date   BACK SURGERY     bilateral knee replacements  2002/2003   CHOLECYSTECTOMY     COLONOSCOPY  03/2002   Dr. Fleet Contras internal hemorrhoids   COLONOSCOPY  06/21/2011   polyp at IC valve (tubular  adenoma)   COLONOSCOPY N/A 07/15/2016   one 8 mm polyp at hepatic flexure (tubular adenoma), on 12 mm polyp at hepatic flexure (tubular adenoma), 3 year surveillance if health permits   COLONOSCOPY N/A 10/10/2019   descending colon diverticulosis, two 4-5 mm polyps at splenic flexure (tubular adenomas). No surveillance due to age.    LUMBAR LAMINECTOMY/DECOMPRESSION MICRODISCECTOMY N/A 04/27/2022   Procedure: Thoracic Eleven-Twelve Decompression;  Surgeon: Coletta Memos, MD;  Location: [facility] OR;  Service: Neurosurgery;  Laterality: N/A;   POLYPECTOMY  07/15/2016   Procedure: POLYPECTOMY;  Surgeon: Corbin Ade, MD;  Location: [facility];  Service: Endoscopy;;  Splenic Flexure polyps x 2 removed via hot snare   POLYPECTOMY  10/10/2019   Procedure: POLYPECTOMY;  Surgeon: Corbin Ade, MD;  Location: [facility];  Service: Endoscopy;;   TUBAL LIGATION     FAMILY HISTORY Family History  Problem Relation Age of Onset   Diabetes Brother    Colon polyps Brother    Diabetes Sister    Cancer Father        Likely prostate   Stroke Mother 52   Colon cancer Neg Hx    SOCIAL HISTORY Social History   Tobacco Use   Smoking status: Never   Smokeless tobacco: Never  Vaping Use   Vaping status: Never Used  Substance Use Topics   Alcohol use: No    Alcohol/week: 0.0 standard drinks of alcohol   Drug use: No       OPHTHALMIC EXAM:  Base Eye Exam     Visual Acuity (Snellen - Linear)       Right Left   Dist cc 20/50 -2 20/200   Dist ph Kistler   -2   Dist ph cc NI NI    Correction: Glasses         Tonometry (Tonopen, 9:57 AM)       Right Left   Pressure 8 9         Pupils       Pupils Dark Light Shape React APD   Right PERRL 3 2 Round Brisk None   Left PERRL 3 2 Round Brisk None         Visual Fields       Left Right    Full Full         Extraocular Movement       Right Left    Full, Ortho Full, Ortho         Neuro/Psych     Oriented x3: Yes    Mood/Affect: Normal         Dilation     Both eyes: 2.5% Phenylephrine @ 9:57 AM  Slit Lamp and Fundus Exam     Slit Lamp Exam       Right Left   Lids/Lashes Dermatochalasis - upper lid Dermatochalasis - upper lid   Conjunctiva/Sclera mild melanosis mild melanosis   Cornea arcus arcus   Anterior Chamber deep and clear deep and clear   Iris Round and dilated Round and dilated   Lens 2+ Nuclear sclerosis, 2+ Cortical cataract 2-3+ Nuclear sclerosis, 2-3+ Cortical cataract   Anterior Vitreous Vitreous syneresis, Posterior vitreous detachment, vitreous condensations Vitreous syneresis, Posterior vitreous detachment         Fundus Exam       Right Left   Disc nasal hyperemia -- improved, sharp rim, PPA, mild temporal pallor, peripapillary subretinal heme superior and nasal to disc -- resolved Pink and Sharp, mild tilt, +PPA   C/D Ratio 0.3 0.3   Macula Flat, good foveal reflex, inferior macula atrophic, RPE mottling and clumping, subretinal heme and edema superior macula -- slightly improved Blunted foveal reflex, central and inferior IRH and exudates -- improved, central cystic changes -- persistent, retinal macroanuerysms inferior macula -- improved, scattered DBH -- improved, punctate exudates centrally -- improved, mild ERM   Vessels attenuated, Tortuous, macroanuerysms with heme along ST arcades -- fibrosing and turning white attenuated, Tortuous, retinal macroaneurysms along IT arcades--improved   Periphery Attached, peripapillary subretinal heme superior and nasal to disc -- resolved Attached, scattered IRH--improved/essentially resolved           Refraction     Wearing Rx       Sphere Cylinder Axis Add   Right -1.25 +0.50 163 +2.00   Left -0.75 +0.50 153 +2.00           IMAGING AND PROCEDURES  Imaging and Procedures for 08/12/2023  OCT, Retina - OU - Both Eyes       Right Eye Quality was good. Central Foveal Thickness: 237. Progression has  improved. Findings include normal foveal contour, no SRF, myopic contour, intraretinal hyper-reflective material, intraretinal fluid, subretinal fluid, inner retinal atrophy (Interval improvement IRF/IRHM (macroanuerysm) along ST arcades, Diffuse IRA inferior macula / hemisphere -- remote BRAO; stable improvement in disc edema).   Left Eye Quality was good. Central Foveal Thickness: 393. Progression has improved. Findings include abnormal foveal contour, myopic contour, retinal drusen , subretinal hyper-reflective material, intraretinal hyper-reflective material, intraretinal fluid, outer retinal atrophy (Mild interval improvement in disc elevation, central ORA, drusen, SRHM, persistent IRF/IRHM temporal macula -- improved).   Notes *Images captured and stored on drive  Diagnosis / Impression:  OD: Interval improvement IRF/IRHM (macroanuerysm) along ST arcades, Diffuse IRA inferior macula / hemisphere -- remote BRAO; stable improvement in disc edema OS: Mild interval improvement in disc elevation, central ORA, drusen, SRHM, persistent IRF/IRHM temporal macula -- improved  Clinical management:  See below  Abbreviations: NFP - Normal foveal profile. CME - cystoid macular edema. PED - pigment epithelial detachment. IRF - intraretinal fluid. SRF - subretinal fluid. EZ - ellipsoid zone. ERM - epiretinal membrane. ORA - outer retinal atrophy. ORT - outer retinal tubulation. SRHM - subretinal hyper-reflective material. IRHM - intraretinal hyper-reflective material      Intravitreal Injection, Pharmacologic Agent - OD - Right Eye       Time Out 08/12/2023. 11:14 AM. Confirmed correct patient, procedure, site, and patient consented.   Anesthesia Topical anesthesia was used. Anesthetic medications included Lidocaine 2%, Proparacaine 0.5%.   Procedure Preparation included 5% betadine to ocular surface, eyelid speculum. A (33g) needle was used.   Injection:  1.25 mg Bevacizumab 1.25mg /0.32ml    Route: Intravitreal, Site: Right Eye   NDC: P3213405, Lot: [id number], Expiration date: 08/27/2023   Post-op Post injection exam found visual acuity of at least counting fingers. The patient tolerated the procedure well. There were no complications. The patient received written and verbal post procedure care education. Post injection medications were not given.      Intravitreal Injection, Pharmacologic Agent - OS - Left Eye       Time Out 08/12/2023. 11:16 AM. Confirmed correct patient, procedure, site, and patient consented.   Anesthesia Topical anesthesia was used. Anesthetic medications included Lidocaine 2%, Proparacaine 0.5%.   Procedure Preparation included 5% betadine to ocular surface, eyelid speculum. A (33g) needle was used.   Injection: 2 mg aflibercept 2 MG/0.05ML   Route: Intravitreal, Site: Left Eye   NDC: L6038910, Lot: [id number], Expiration date: 11/04/2024, Waste: 0 mL   Post-op Post injection exam found visual acuity of at least counting fingers. The patient tolerated the procedure well. There were no complications. The patient received written and verbal post procedure care education. Post injection medications were not given.             ASSESSMENT/PLAN:    ICD-10-CM   1. 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 Essential hypertension  I10     4. Hypertensive retinopathy of both eyes  H35.033     5. Combined forms of age-related cataract of both eyes  H25.813      1,2. BRVO w/ CME and retinal macronauerysm OS - s/p IVA OS #1 (12.15.23) #2 (01.12.24), #3 (02.09.24), #4 (03.15.24), #5 (04.12.24), #6 (06.07.24) - s/p IVE OS #1 (05.10.24), #2 (07.08.24) #3(8.9.24) - pt previously followed with Dr. Fayrene Fearing in [place], [place],  but now lives in a facility in [place] and cannot be transported to [facility] - history of laser photoablation of retinal macroaneurysm OS per chart review - FA 12.15.23 shows focal MAs w/ late leakage - BCVA OS 20/200 - stable - exam shows macroaneurysm with surrounding circinate exudates and edema, inferior macula OS -- improved - OCT shows OS: Mild interval improvement in disc elevation, central ORA, drusen, SRHM, persistent IRF/IRHM temporal macula -- slightly improved - recommend IVE OS #4 today, 09.06.24 - RBA of procedure discussed, questions answered - IVE informed consent obtained and signed, 06.07.24 (OU) - see procedure note - f/u 4-5 weeks-- DFE/OCT/possible injection  1,2. BRVO  w/ CME and retinal macroaneurysm OD  - s/p IVA OD #1 (06.07.24) #2 (07.08.24), #3 (08.09.24) - OCT shows OD: Interval improvement IRF/IRHM (macroanuerysm) along ST arcades, Diffuse IRA inferior macula / hemisphere -- remote BRAO; stable improvement in disc edema  - exam shows peripapillary hemorrhage, mild NVD nasal disc -- improving + new macroaneurysm w/ surrounding heme and edema along ST arcades  - FA 12.15.23 shows mild NVD nasal disc w/ leakage  - pt may benefit from PRP and anti-VEGF therapy  - BCVA OD 20/50 - stable  - recommend IVA OD #4 today, 09.06.24, for macular edema from BRVO  - pt wishes to proceed with injection  - RBA of procedure discussed, questions answered - see procedure note - f/u 4-5 weeks, DFE, OCT  3,4. Hypertensive retinopathy OU  -  pt and son report recent changes in BP meds - discussed importance of tight BP control and likely relationship with BRVOs  5. Mixed Cataract OU - The symptoms of cataract, surgical options, and treatments and risks were discussed with patient. - discussed diagnosis and progression - likely visually significant - under the expert management of Dr. Elmer Picker  - referred to [facility] for cataract eval on 11/10/23 - clear from a retina standpoint  to proceed with cataract surgery when pt and surgeon are ready  - would recommend timing cataract surgery ~1-2 wks after anti-VEGF injection  Ophthalmic Meds Ordered this visit:  No orders of the defined types were placed in this encounter.    Return in about 5 weeks (around 09/16/2023) for f/u BRVO OU, DFE, OCT, Possible, IVA, OD, IVE, OS.  There are no Patient Instructions on file for this visit. This document serves as a record of services personally performed by Karie Chimera, MD, PhD. It was created on their behalf by Berlin Hun COT, an ophthalmic technician. The creation of this record is the provider's dictation and/or activities during the visit.    Electronically signed by: Berlin Hun COT 9.5.24 11:26 AM  This document serves as a record of services personally performed by Karie Chimera, MD, PhD. It was created on their behalf by Charlette Caffey, COT an ophthalmic technician. The creation of this record is the provider's dictation and/or activities during the visit.    Electronically signed by:  Charlette Caffey, COT  08/12/23 11:26 AM   Abbreviations: M myopia (nearsighted); A astigmatism; H hyperopia (farsighted); P presbyopia; Mrx spectacle prescription;  CTL contact lenses; OD right eye; OS left eye; OU both eyes  XT exotropia; ET esotropia; PEK punctate epithelial keratitis; PEE punctate epithelial erosions; DES dry eye syndrome; MGD meibomian gland dysfunction; ATs artificial tears; PFAT's preservative free artificial tears; NSC nuclear sclerotic cataract; PSC posterior subcapsular cataract; ERM epi-retinal membrane; PVD posterior vitreous detachment; RD retinal detachment; DM diabetes mellitus; DR diabetic retinopathy; NPDR non-proliferative diabetic retinopathy; PDR proliferative diabetic retinopathy; CSME clinically significant macular edema; DME diabetic macular edema; dbh dot blot hemorrhages; CWS cotton wool spot; POAG primary open angle glaucoma;  C/D cup-to-disc ratio; HVF humphrey visual field; GVF goldmann visual field; OCT optical coherence tomography; IOP intraocular pressure; BRVO Branch retinal vein occlusion; CRVO central retinal vein occlusion; CRAO central retinal artery occlusion; BRAO branch retinal artery occlusion; RT retinal tear; SB scleral buckle; PPV pars plana vitrectomy; VH Vitreous hemorrhage; PRP panretinal laser photocoagulation; IVK intravitreal kenalog; VMT vitreomacular traction; MH Macular hole;  NVD neovascularization of the disc; NVE neovascularization elsewhere; AREDS age related eye disease study; ARMD age related macular degeneration; POAG primary open angle glaucoma; EBMD epithelial/anterior basement membrane dystrophy; ACIOL anterior chamber intraocular lens; IOL intraocular lens; PCIOL posterior chamber intraocular lens; Phaco/IOL phacoemulsification with intraocular lens placement; PRK photorefractive keratectomy; LASIK laser assisted in situ keratomileusis; HTN hypertension; DM diabetes mellitus; COPD chronic obstructive pulmonary disease

## 2023-08-15 ENCOUNTER — Encounter (INDEPENDENT_AMBULATORY_CARE_PROVIDER_SITE_OTHER): Payer: Self-pay | Admitting: Ophthalmology

## 2023-08-15 MED ORDER — AFLIBERCEPT 2MG/0.05ML IZ SOLN FOR KALEIDOSCOPE
2.0000 mg | INTRAVITREAL | Status: AC | PRN
Start: 2023-08-15 — End: 2023-08-15
  Administered 2023-08-15: 2 mg via INTRAVITREAL

## 2023-08-15 MED ORDER — BEVACIZUMAB CHEMO INJECTION 1.25MG/0.05ML SYRINGE FOR KALEIDOSCOPE
1.2500 mg | INTRAVITREAL | Status: AC | PRN
Start: 2023-08-15 — End: 2023-08-15
  Administered 2023-08-15: 1.25 mg via INTRAVITREAL

## 2023-08-18 ENCOUNTER — Ambulatory Visit: Payer: Medicare PPO | Attending: Cardiology | Admitting: Cardiology

## 2023-08-18 ENCOUNTER — Encounter: Payer: Self-pay | Admitting: Cardiology

## 2023-08-18 VITALS — BP 138/76 | HR 48 | Wt 257.4 lb

## 2023-08-18 DIAGNOSIS — I10 Essential (primary) hypertension: Secondary | ICD-10-CM

## 2023-08-18 DIAGNOSIS — R001 Bradycardia, unspecified: Secondary | ICD-10-CM | POA: Diagnosis not present

## 2023-08-18 MED ORDER — SEMAGLUTIDE-WEIGHT MANAGEMENT 2.4 MG/0.75ML ~~LOC~~ SOAJ
2.4000 mg | SUBCUTANEOUS | 5 refills | Status: AC
Start: 2023-12-12 — End: 2024-05-28

## 2023-08-18 MED ORDER — SEMAGLUTIDE-WEIGHT MANAGEMENT 0.25 MG/0.5ML ~~LOC~~ SOAJ: 0.2500 mg | SUBCUTANEOUS | 0 refills | Status: AC

## 2023-08-18 MED ORDER — SEMAGLUTIDE-WEIGHT MANAGEMENT 0.5 MG/0.5ML ~~LOC~~ SOAJ: 0.5000 mg | SUBCUTANEOUS | 0 refills | Status: AC

## 2023-08-18 MED ORDER — SEMAGLUTIDE-WEIGHT MANAGEMENT 1.7 MG/0.75ML ~~LOC~~ SOAJ: 1.7000 mg | SUBCUTANEOUS | 0 refills | Status: AC

## 2023-08-18 MED ORDER — SEMAGLUTIDE-WEIGHT MANAGEMENT 1 MG/0.5ML ~~LOC~~ SOAJ
1.0000 mg | SUBCUTANEOUS | 0 refills | Status: AC
Start: 2023-10-15 — End: 2023-11-12

## 2023-08-18 NOTE — Progress Notes (Signed)
Cardiology Office Note:    Date:  08/18/2023   ID:  Shelley Mitchell, DOB 07-04-1941, MRN [id number]  PCP:  Katherina Right, MD    [facility]  Cardiologist:  Debbe Odea, MD  Advanced Practice Provider:  No care team member to display Electrophysiologist:  None       Referring MD: Katherina Right, MD   Chief Complaint  Patient presents with   Follow-up    Resident at [facility].  Facility has been concerned with fluctuating bp and bradycardia.  Medications reconciled with med list from facility.      History of Present Illness:    Shelley Mitchell is a 82 y.o. female with a hx of hypertension, hyperlipidemia, bradycardia, OSA, presenting for follow-up.  She lives in a skilled nursing facility, blood pressure checks in facility have been elevated with systolics in the 200s sometimes.  Was also told her heart rate was low.  She has a history of sinus bradycardia, cardiac monitor did not show any high degree AV blocks.  She is compliant with medications as prescribed, would like to try prescription for [facility] to see if insurance will approve.  Insurance did not previously approve this medication  Prior notes Cardiac monitor 3/24 showed average heart rate heart rates in the 50s, no high degree AV block noted. Echo 12/2022 EF 60 to 65%, aortic valve sclerosis, impaired relaxation.   Past Medical History:  Diagnosis Date   Endometrial polyp    Dr. Despina Hidden   History of endometrial hyperplasia    HTN (hypertension)    Hx of adenomatous colonic polyps 1999   Hyperlipidemia    Mental disorder    GAD   Obesity    Osteoarthritis    Tachycardia    subsequently with bradycardia (?secondary to meds)    Past Surgical History:  Procedure Laterality Date   BACK SURGERY     bilateral knee replacements  2002/2003   CHOLECYSTECTOMY     COLONOSCOPY  03/2002   Dr. Fleet Contras internal hemorrhoids   COLONOSCOPY  06/21/2011   polyp at IC valve (tubular adenoma)    COLONOSCOPY N/A 07/15/2016   one 8 mm polyp at hepatic flexure (tubular adenoma), on 12 mm polyp at hepatic flexure (tubular adenoma), 3 year surveillance if health permits   COLONOSCOPY N/A 10/10/2019   descending colon diverticulosis, two 4-5 mm polyps at splenic flexure (tubular adenomas). No surveillance due to age.    LUMBAR LAMINECTOMY/DECOMPRESSION MICRODISCECTOMY N/A 04/27/2022   Procedure: Thoracic Eleven-Twelve Decompression;  Surgeon: Coletta Memos, MD;  Location: [facility] OR;  Service: Neurosurgery;  Laterality: N/A;   POLYPECTOMY  07/15/2016   Procedure: POLYPECTOMY;  Surgeon: Corbin Ade, MD;  Location: [facility];  Service: Endoscopy;;  Splenic Flexure polyps x 2 removed via hot snare   POLYPECTOMY  10/10/2019   Procedure: POLYPECTOMY;  Surgeon: Corbin Ade, MD;  Location: [facility];  Service: Endoscopy;;   TUBAL LIGATION      Current Medications: Current Meds  Medication Sig   Semaglutide-Weight Management 0.25 MG/0.5ML SOAJ Inject 0.25 mg into the skin once a week for 28 days.   [START ON 09/16/2023] Semaglutide-Weight Management 0.5 MG/0.5ML SOAJ Inject 0.5 mg into the skin once a week for 28 days.   [START ON 10/15/2023] Semaglutide-Weight Management 1 MG/0.5ML SOAJ Inject 1 mg into the skin once a week for 28 days.   [START ON 11/13/2023] Semaglutide-Weight Management 1.7 MG/0.75ML SOAJ Inject 1.7 mg into the skin  once a week for 28 days.   [START ON 12/12/2023] Semaglutide-Weight Management 2.4 MG/0.75ML SOAJ Inject 2.4 mg into the skin once a week.     Allergies:   Ace inhibitors and Nsaids   Social History   Socioeconomic History   Marital status: Married    Spouse name: Not on file   Number of children: 4   Years of education: Not on file   Highest education level: Not on file  Occupational History    Employer: RETIRED  Tobacco Use   Smoking status: Never   Smokeless tobacco: Never  Vaping Use   Vaping status: Never Used  Substance and Sexual Activity    Alcohol use: No    Alcohol/week: 0.0 standard drinks of alcohol   Drug use: No   Sexual activity: Not Currently    Birth control/protection: Post-menopausal  Other Topics Concern   Not on file  Social History Narrative   Not on file   Social Determinants of Health   Financial Resource Strain: Not on file  Food Insecurity: No Food Insecurity (12/26/2022)   Hunger Vital Sign    Worried About Running Out of Food in the Last Year: Never true    Ran Out of Food in the Last Year: Never true  Transportation Needs: No Transportation Needs (12/26/2022)   PRAPARE - Administrator, Civil Service (Medical): No    Lack of Transportation (Non-Medical): No  Physical Activity: Not on file  Stress: Not on file  Social Connections: Not on file     Family History: The patient's family history includes Cancer in her father; Colon polyps in her brother; Diabetes in her brother and sister; Stroke (age of onset: 30) in her mother. There is no history of Colon cancer.  ROS:   Please see the history of present illness.     All other systems reviewed and are negative.  EKGs/Labs/Other Studies Reviewed:    The following studies were reviewed today:   EKG:  EKG not  ordered today.    Recent Labs: 12/23/2022: TSH 2.994 12/26/2022: Magnesium 2.0 12/27/2022: Hemoglobin 11.2; Platelets 187 01/10/2023: BUN 18; Creatinine, Ser 0.57; Potassium 4.0; Sodium 137  Recent Lipid Panel    Component Value Date/Time   CHOL 160 04/24/2022 0341   TRIG 83 04/24/2022 0341   HDL 67 04/24/2022 0341   CHOLHDL 2.4 04/24/2022 0341   VLDL 17 04/24/2022 0341   LDLCALC 76 04/24/2022 0341     Risk Assessment/Calculations:      Physical Exam:    VS:  BP 138/76 (BP Location: Left Arm, Patient Position: Sitting, Cuff Size: Large)   Pulse (!) 48   Wt 257 lb 6.4 oz (116.8 kg) Comment: per facility documentation  SpO2 97%   BMI 47.08 kg/m     Wt Readings from Last 3 Encounters:  08/18/23 257 lb 6.4 oz  (116.8 kg)  03/14/23 256 lb (116.1 kg)  01/10/23 249 lb 14.4 oz (113.4 kg)     GEN:  Well nourished, well developed in no acute distress HEENT: Normal NECK: No JVD; No carotid bruits CARDIAC: Bradycardic, regular, 2/6 systolic murmur RESPIRATORY:  Clear to auscultation without rales, wheezing or rhonchi  ABDOMEN: Soft, non-tender, non-distended MUSCULOSKELETAL:  1+ edema; No deformity  SKIN: Warm and dry NEUROLOGIC:  Alert and oriented x 3 PSYCHIATRIC:  Normal affect   ASSESSMENT:    1. Primary hypertension   2. Morbid obesity (HCC)   3. Bradycardia    PLAN:  In order of problems listed above:  Hypertension, blood pressure is controlled.  Recommend manual BP measurements at facility, also encourage adequate BP cuff.  Continue losartan 50 mg daily, Norvasc 5 mg daily. Morbid obesity, low-calorie diet, prescribed Wegovy again.  Previously Reginal Lutes was not covered by insurance. Asymptomatic sinus bradycardia, cardiac monitor showed no high degree AV block.  No indication for pacemaker.  Patient is wheelchair-bound.  Follow-up in 6 months.   Medication Adjustments/Labs and Tests Ordered: Current medicines are reviewed at length with the patient today.  Concerns regarding medicines are outlined above.  No orders of the defined types were placed in this encounter.  Meds ordered this encounter  Medications   Semaglutide-Weight Management 0.25 MG/0.5ML SOAJ    Sig: Inject 0.25 mg into the skin once a week for 28 days.    Dispense:  2 mL    Refill:  0   Semaglutide-Weight Management 0.5 MG/0.5ML SOAJ    Sig: Inject 0.5 mg into the skin once a week for 28 days.    Dispense:  2 mL    Refill:  0   Semaglutide-Weight Management 1 MG/0.5ML SOAJ    Sig: Inject 1 mg into the skin once a week for 28 days.    Dispense:  2 mL    Refill:  0   Semaglutide-Weight Management 1.7 MG/0.75ML SOAJ    Sig: Inject 1.7 mg into the skin once a week for 28 days.    Dispense:  3 mL    Refill:  0    Semaglutide-Weight Management 2.4 MG/0.75ML SOAJ    Sig: Inject 2.4 mg into the skin once a week.    Dispense:  3 mL    Refill:  5    Patient Instructions  Medication Instructions:   Start taking Wegovy  Month 1: 0.25 mg once a week.  Month 2: 0.5 mg once a week. (Call or send [place] a MyChart message when you are on week 2, so we can send your next dose in for you).  Month 3: 1 mg once a week.  Month 4: 1.7 mg once a week.  Month 5 and beyond: 2.4 mg once a week (maintenance dose)  *If you need a refill on your cardiac medications before your next appointment, please call your pharmacy*   Lab Work:  None Ordered  If you have labs (blood work) drawn today and your tests are completely normal, you will receive your results only by: MyChart Message (if you have MyChart) OR A paper copy in the mail If you have any lab test that is abnormal or we need to change your treatment, we will call you to review the results.   Testing/Procedures:  None Ordered   Follow-Up: At [facility], you and your health needs are our priority.  As part of our continuing mission to provide you with exceptional heart care, we have created designated Provider Care Teams.  These Care Teams include your primary Cardiologist (physician) and Advanced Practice Providers (APPs -  Physician Assistants and Nurse Practitioners) who all work together to provide you with the care you need, when you need it.  We recommend signing up for the patient portal called "MyChart".  Sign up information is provided on this After Visit Summary.  MyChart is used to connect with patients for Virtual Visits (Telemedicine).  Patients are able to view lab/test results, encounter notes, upcoming appointments, etc.  Non-urgent messages can be sent to your provider as well.   To learn  more about what you can do with MyChart, go to [email or web address].    Your next appointment:   6 month(s)  Provider:   You may  see Debbe Odea, MD or one of the following Advanced Practice Providers on your designated Care Team:   Nicolasa Ducking, NP Eula Listen, PA-C Cadence Fransico Michael, PA-C Charlsie Quest, NP   Signed, Debbe Odea, MD  08/18/2023 12:48 PM    [facility]

## 2023-08-18 NOTE — Patient Instructions (Signed)
Medication Instructions:   Start taking Wegovy  Month 1: 0.25 mg once a week.  Month 2: 0.5 mg once a week. (Call or send [place] a MyChart message when you are on week 2, so we can send your next dose in for you).  Month 3: 1 mg once a week.  Month 4: 1.7 mg once a week.  Month 5 and beyond: 2.4 mg once a week (maintenance dose)  *If you need a refill on your cardiac medications before your next appointment, please call your pharmacy*   Lab Work:  None Ordered  If you have labs (blood work) drawn today and your tests are completely normal, you will receive your results only by: [place] (if you have MyChart) OR A paper copy in the mail If you have any lab test that is abnormal or we need to change your treatment, we will call you to review the results.   Testing/Procedures:  None Ordered   Follow-Up: At [facility], you and your health needs are our priority.  As part of our continuing mission to provide you with exceptional heart care, we have created designated Provider Care Teams.  These Care Teams include your primary Cardiologist (physician) and Advanced Practice Providers (APPs -  Physician Assistants and Nurse Practitioners) who all work together to provide you with the care you need, when you need it.  We recommend signing up for the patient portal called "MyChart".  Sign up information is provided on this After Visit Summary.  MyChart is used to connect with patients for Virtual Visits (Telemedicine).  Patients are able to view lab/test results, encounter notes, upcoming appointments, etc.  Non-urgent messages can be sent to your provider as well.   To learn more about what you can do with MyChart, go to [email or web address].    Your next appointment:   6 month(s)  Provider:   You may see Kate Sable, MD or one of the following Advanced Practice Providers on your designated Care Team:   Murray Hodgkins, NP Christell Faith, PA-C [name],  PA-C Gerrie Nordmann, NP

## 2023-09-15 NOTE — Progress Notes (Signed)
[facility] - Clinic Note  09/16/2023     CHIEF COMPLAINT Patient presents for Retina Follow Up   HISTORY OF PRESENT ILLNESS: Shelley Mitchell is a 82 y.o. female who presents to the clinic today for:   HPI     Retina Follow Up   Patient presents with  CRVO/BRVO.  In both eyes.  This started 5 weeks ago.  I, the attending physician,  performed the HPI with the patient and updated documentation appropriately.        Comments   Patient here for 5 weeks retina follow up for BRVO OU. Patient states vision about the same. No eye pain.       Last edited by Rennis Chris, MD on 09/16/2023 12:31 PM.     Referring physician: Mateo Flow, MD [address] [place],  [place] [address]  HISTORICAL INFORMATION:   Selected notes from the MEDICAL RECORD NUMBER Referred by Dr. Elmer Picker for concern of BDR / PDR LEE:  Ocular Hx- PMH-    CURRENT MEDICATIONS: Current Outpatient Medications (Ophthalmic Drugs)  Medication Sig   latanoprost (XALATAN) 0.005 % ophthalmic solution Place 1 drop into both eyes at bedtime.   No current facility-administered medications for this visit. (Ophthalmic Drugs)   Current Outpatient Medications (Other)  Medication Sig   acetaminophen (TYLENOL) 500 MG tablet Take 1,000 mg by mouth every 12 (twelve) hours.   albuterol (VENTOLIN HFA) 108 (90 Base) MCG/ACT inhaler Inhale 2 puffs into the lungs every 8 (eight) hours as needed for wheezing or shortness of breath.   Amino Acids-Protein Hydrolys (PRO-STAT AWC) LIQD Take 30 mLs by mouth 2 (two) times daily between meals.   amLODipine (NORVASC) 5 MG tablet Take 1 tablet (5 mg total) by mouth daily.   aspirin EC 81 MG tablet Take 81 mg by mouth daily. Swallow whole.   furosemide (LASIX) 20 MG tablet Take 20 mg by mouth daily.   levothyroxine (SYNTHROID) 25 MCG tablet Take 25 mcg by mouth daily before breakfast.   losartan (COZAAR) 50 MG tablet Take 1 tablet (50 mg total) by mouth  daily.   lubiprostone (AMITIZA) 8 MCG capsule Take 1 capsule (8 mcg total) by mouth daily with breakfast.   medroxyPROGESTERone (PROVERA) 10 MG tablet TAKE 1 TABLET BY MOUTH DAILY 10 DAYS PER MONTH (Patient taking differently: Take 10 mg by mouth See admin instructions. Take 10 mg once daily on the 1st-10th day of each month)   Multiple Vitamins-Minerals (MULTIVITAMIN WITH MINERALS) tablet Take 1 tablet by mouth daily.   nystatin powder Apply 1 Application topically 3 (three) times daily.   polyethylene glycol powder (GLYCOLAX/MIRALAX) 17 GM/SCOOP powder Take 17 g by mouth daily.   pravastatin (PRAVACHOL) 40 MG tablet Take 40 mg by mouth at bedtime.   Semaglutide-Weight Management 0.5 MG/0.5ML SOAJ Inject 0.5 mg into the skin once a week for 28 days.   [START ON 10/15/2023] Semaglutide-Weight Management 1 MG/0.5ML SOAJ Inject 1 mg into the skin once a week for 28 days.   [START ON 11/13/2023] Semaglutide-Weight Management 1.7 MG/0.75ML SOAJ Inject 1.7 mg into the skin once a week for 28 days.   [START ON 12/12/2023] Semaglutide-Weight Management 2.4 MG/0.75ML SOAJ Inject 2.4 mg into the skin once a week.   sertraline (ZOLOFT) 100 MG tablet Take 100 mg by mouth daily.   No current facility-administered medications for this visit. (Other)   REVIEW OF SYSTEMS: ROS   Positive for: Musculoskeletal, Cardiovascular, Eyes, Respiratory Last edited by  Laddie Aquas, COA on 09/16/2023  9:57 AM.       ALLERGIES Allergies  Allergen Reactions   Ace Inhibitors Itching   Nsaids Itching   PAST MEDICAL HISTORY Past Medical History:  Diagnosis Date   Endometrial polyp    Dr. Despina Hidden   History of endometrial hyperplasia    HTN (hypertension)    Hx of adenomatous colonic polyps 1999   Hyperlipidemia    Mental disorder    GAD   Obesity    Osteoarthritis    Tachycardia    subsequently with bradycardia (?secondary to meds)   Past Surgical History:  Procedure Laterality Date   BACK SURGERY      bilateral knee replacements  2002/2003   CHOLECYSTECTOMY     COLONOSCOPY  03/2002   Dr. Fleet Contras internal hemorrhoids   COLONOSCOPY  06/21/2011   polyp at IC valve (tubular adenoma)   COLONOSCOPY N/A 07/15/2016   one 8 mm polyp at hepatic flexure (tubular adenoma), on 12 mm polyp at hepatic flexure (tubular adenoma), 3 year surveillance if health permits   COLONOSCOPY N/A 10/10/2019   descending colon diverticulosis, two 4-5 mm polyps at splenic flexure (tubular adenomas). No surveillance due to age.    LUMBAR LAMINECTOMY/DECOMPRESSION MICRODISCECTOMY N/A 04/27/2022   Procedure: Thoracic Eleven-Twelve Decompression;  Surgeon: Coletta Memos, MD;  Location: [facility] OR;  Service: Neurosurgery;  Laterality: N/A;   POLYPECTOMY  07/15/2016   Procedure: POLYPECTOMY;  Surgeon: Corbin Ade, MD;  Location: [facility];  Service: Endoscopy;;  Splenic Flexure polyps x 2 removed via hot snare   POLYPECTOMY  10/10/2019   Procedure: POLYPECTOMY;  Surgeon: Corbin Ade, MD;  Location: [facility];  Service: Endoscopy;;   TUBAL LIGATION     FAMILY HISTORY Family History  Problem Relation Age of Onset   Diabetes Brother    Colon polyps Brother    Diabetes Sister    Cancer Father        Likely prostate   Stroke Mother 44   Colon cancer Neg Hx    SOCIAL HISTORY Social History   Tobacco Use   Smoking status: Never   Smokeless tobacco: Never  Vaping Use   Vaping status: Never Used  Substance Use Topics   Alcohol use: No    Alcohol/week: 0.0 standard drinks of alcohol   Drug use: No       OPHTHALMIC EXAM:  Base Eye Exam     Visual Acuity (Snellen - Linear)       Right Left   Dist cc 20/50 20/150   Dist ph cc NI NI    Correction: Glasses         Tonometry (Tonopen, 9:56 AM)       Right Left   Pressure 11 09         Pupils       Dark Light Shape React APD   Right 3 2 Round Brisk None   Left 3 2 Round Brisk None         Visual Fields (Counting fingers)        Left Right    Full Full         Extraocular Movement       Right Left    Full, Ortho Full, Ortho         Neuro/Psych     Oriented x3: Yes   Mood/Affect: Normal         Dilation     Both eyes: 1.0% Mydriacyl,  2.5% Phenylephrine @ 9:56 AM           Slit Lamp and Fundus Exam     Slit Lamp Exam       Right Left   Lids/Lashes Dermatochalasis - upper lid Dermatochalasis - upper lid   Conjunctiva/Sclera mild melanosis mild melanosis   Cornea arcus arcus   Anterior Chamber deep and clear deep and clear   Iris Round and dilated Round and dilated   Lens 2+ Nuclear sclerosis, 2+ Cortical cataract 2-3+ Nuclear sclerosis, 2-3+ Cortical cataract   Anterior Vitreous Vitreous syneresis, Posterior vitreous detachment, vitreous condensations Vitreous syneresis, Posterior vitreous detachment         Fundus Exam       Right Left   Disc nasal hyperemia -- improved, sharp rim, PPA, mild temporal pallor, peripapillary subretinal heme superior and nasal to disc -- stably resolved Pink and Sharp, mild tilt, +PPA, no edema   C/D Ratio 0.3 0.3   Macula Flat, good foveal reflex, inferior macula atrophic, RPE mottling and clumping, subretinal heme and edema superior macula --improved Blunted foveal reflex, central and inferior IRH and exudates -- improved, central cystic changes -- persistent, retinal macroanuerysms inferior macula -- improved, scattered DBH -- improved, punctate exudates centrally -- improved, mild ERM   Vessels attenuated, Tortuous, macroanuerysms with heme along ST arcades --improving-- fibrosing and turning white attenuated, Tortuous, retinal macroaneurysms along IT arcades--improved   Periphery Attached, peripapillary subretinal heme superior and nasal to disc -- stably resolved Attached, scattered IRH--improved/essentially resolved, cluster of exudates IT periphery           Refraction     Wearing Rx       Sphere Cylinder Axis Add   Right -1.25 +0.50 163  +2.00   Left -0.75 +0.50 153 +2.00           IMAGING AND PROCEDURES  Imaging and Procedures for 09/16/2023  OCT, Retina - OU - Both Eyes       Right Eye Quality was good. Central Foveal Thickness: 234. Progression has improved. Findings include normal foveal contour, no SRF, myopic contour, intraretinal hyper-reflective material, intraretinal fluid, subretinal fluid, inner retinal atrophy (stable improvement IRF/IRHM (macroanuerysm) along ST arcades, Diffuse IRA inferior macula / hemisphere -- remote BRAO; stable improvement in disc edema).   Left Eye Quality was good. Central Foveal Thickness: 414. Progression has improved. Findings include abnormal foveal contour, myopic contour, retinal drusen , subretinal hyper-reflective material, intraretinal hyper-reflective material, intraretinal fluid, outer retinal atrophy (Persistent disc elevation, ERM with central thickening and ORA; drusen, SRHM, persistent IRF/IRHM temporal macula -- slightly improved).   Notes *Images captured and stored on drive  Diagnosis / Impression:  OD: stable improvement IRF/IRHM (macroanuerysm) along ST arcades, Diffuse IRA inferior macula / hemisphere -- remote BRAO; stable improvement in disc edema OS: Persistent disc elevation, ERM with central thickening and ORA; drusen, SRHM, persistent IRF/IRHM temporal macula --  slightly improved  Clinical management:  See below  Abbreviations: NFP - Normal foveal profile. CME - cystoid macular edema. PED - pigment epithelial detachment. IRF - intraretinal fluid. SRF - subretinal fluid. EZ - ellipsoid zone. ERM - epiretinal membrane. ORA - outer retinal atrophy. ORT - outer retinal tubulation. SRHM - subretinal hyper-reflective material. IRHM - intraretinal hyper-reflective material      Intravitreal Injection, Pharmacologic Agent - OS - Left Eye       Time Out 09/16/2023. 10:49 AM. Confirmed correct patient, procedure, site, and patient consented.    Anesthesia Topical anesthesia was  used. Anesthetic medications included Lidocaine 2%, Proparacaine 0.5%.   Procedure Preparation included 5% betadine to ocular surface, eyelid speculum. A (33g) needle was used.   Injection: 2 mg aflibercept 2 MG/0.05ML   Route: Intravitreal, Site: Left Eye   NDC: L6038910, Lot: [id number], Expiration date: 10/05/2024, Waste: 0 mL   Post-op Post injection exam found visual acuity of at least counting fingers. The patient tolerated the procedure well. There were no complications. The patient received written and verbal post procedure care education. Post injection medications were not given.      Intravitreal Injection, Pharmacologic Agent - OD - Right Eye       Time Out 09/16/2023. 10:59 AM. Confirmed correct patient, procedure, site, and patient consented.   Anesthesia Topical anesthesia was used. Anesthetic medications included Lidocaine 2%, Proparacaine 0.5%.   Procedure Preparation included 5% betadine to ocular surface, eyelid speculum. A (33g) needle was used.   Injection: 1.25 mg Bevacizumab 1.25mg /0.17ml   Route: Intravitreal, Site: Right Eye   NDC: P3213405, Lot: [id number], Expiration date: 11/04/2023   Post-op Post injection exam found visual acuity of at least counting fingers. The patient tolerated the procedure well. There were no complications. The patient received written and verbal post procedure care education. Post injection medications were not given.            ASSESSMENT/PLAN:    ICD-10-CM   1. 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 Essential hypertension  I10     4. Hypertensive retinopathy of both  eyes  H35.033     5. Combined forms of age-related cataract of both eyes  H25.813      1,2. BRVO w/ CME and retinal macronauerysm OS - s/p IVA OS #1 (12.15.23) #2 (01.12.24), #3 (02.09.24), #4 (03.15.24), #5 (04.12.24), #6 (06.07.24) - s/p IVE OS #1 (05.10.24), #2 (07.08.24) #3(8.9.24), #4 (09.09.24) - pt previously followed with Dr. Fayrene Fearing in [place], [place], but now lives in a facility in [place] and cannot be transported to [facility] - history of laser photoablation of retinal macroaneurysm OS per chart review - FA 12.15.23 shows focal MAs w/ late leakage - BCVA OS 20/150 - improved - exam shows macroaneurysm with surrounding circinate exudates and edema, inferior macula OS -- improved - OCT shows OS: Persistent disc elevation, ERM with central thickening and ORA; drusen, SRHM, persistent IRF/IRHM temporal macula -- slightly improved at 5 wks - recommend IVE OS #5 today, 10.11.24 w/ f/u in 5 wks - RBA of procedure discussed, questions answered - IVE informed consent obtained and signed, 06.07.24 (OU) - see procedure note - f/u 5 weeks-- DFE/OCT/possible injection  1,2. BRVO  w/ CME and retinal macroaneurysm OD  - s/p IVA OD #1 (06.07.24) #2 (07.08.24), #3 (08.09.24) #4(09.06.24) - OCT shows OD: stable improvement IRF/IRHM (macroanuerysm) along ST arcades, Diffuse IRA inferior macula / hemisphere -- remote BRAO; stable improvement in disc edema at 5 wks - exam shows peripapillary hemorrhage, mild NVD nasal disc -- improving + new macroaneurysm w/ surrounding heme and edema along ST arcades -- improving  - FA 12.15.23 shows mild NVD nasal disc w/ leakage  - pt may  benefit from PRP and anti-VEGF therapy  - BCVA OD 20/50 - stable  - recommend IVA OD #5 today, 10.10.24, for macular edema from BRVO w/ f/u in 5 wks again  - pt wishes to proceed with injection  - RBA of procedure discussed, questions answered - see procedure note - f/u 5 weeks, DFE, OCT  3,4. Hypertensive retinopathy OU  -  pt and son report recent changes in BP meds - discussed importance of tight BP control and likely relationship with BRVOs  5. Mixed Cataract OU - The symptoms of cataract, surgical options, and treatments and risks were discussed with patient. - discussed diagnosis and progression - likely visually significant - under the expert management of Dr. Elmer Picker  - referred to [facility] for cataract eval on 11/10/23 - clear from a retina standpoint to proceed with cataract surgery when pt and surgeon are ready  - would recommend timing cataract surgery ~1-2 wks after anti-VEGF injection  Ophthalmic Meds Ordered this visit:  Meds ordered this encounter  Medications   aflibercept (EYLEA) SOLN 2 mg   Bevacizumab (AVASTIN) SOLN 1.25 mg     Return in about 5 weeks (around 10/21/2023) for f/u BRVO OU, DFE, OCT, Possible, IVA, OD, IVE, OS.  There are no Patient Instructions on file for this visit.   Electronically signed by: Berlin Hun COT TODAY@ 12:35 PM  This document serves as a record of services personally performed by Karie Chimera, MD, PhD. It was created on their behalf by Charlette Caffey, COT an ophthalmic technician. The creation of this record is the provider's dictation and/or activities during the visit.    Electronically signed by:  Charlette Caffey, COT  09/16/23 12:35 PM  Karie Chimera, M.D., Ph.D. Diseases & Surgery of the Retina and Vitreous [facility] 09/16/2023   I have reviewed the above documentation for accuracy and completeness, and I agree with the above. Karie Chimera, M.D., Ph.D. 09/16/23 12:35 PM    Abbreviations: M myopia (nearsighted); A astigmatism; H hyperopia (farsighted); P presbyopia; Mrx spectacle prescription;  CTL contact lenses; OD right eye; OS left eye; OU both eyes  XT exotropia; ET esotropia; PEK punctate epithelial keratitis; PEE punctate epithelial erosions; DES dry eye syndrome; MGD meibomian gland  dysfunction; ATs artificial tears; PFAT's preservative free artificial tears; NSC nuclear sclerotic cataract; PSC posterior subcapsular cataract; ERM epi-retinal membrane; PVD posterior vitreous detachment; RD retinal detachment; DM diabetes mellitus; DR diabetic retinopathy; NPDR non-proliferative diabetic retinopathy; PDR proliferative diabetic retinopathy; CSME clinically significant macular edema; DME diabetic macular edema; dbh dot blot hemorrhages; CWS cotton wool spot; POAG primary open angle glaucoma; C/D cup-to-disc ratio; HVF humphrey visual field; GVF goldmann visual field; OCT optical coherence tomography; IOP intraocular pressure; BRVO Branch retinal vein occlusion; CRVO central retinal vein occlusion; CRAO central retinal artery occlusion; BRAO branch retinal artery occlusion; RT retinal tear; SB scleral buckle; PPV pars plana vitrectomy; VH Vitreous hemorrhage; PRP panretinal laser photocoagulation; IVK intravitreal kenalog; VMT vitreomacular traction; MH Macular hole;  NVD neovascularization of the disc; NVE neovascularization elsewhere; AREDS age related eye disease study; ARMD age related macular degeneration; POAG primary open angle glaucoma; EBMD epithelial/anterior basement membrane dystrophy; ACIOL anterior chamber intraocular lens; IOL intraocular lens; PCIOL posterior chamber intraocular lens; Phaco/IOL phacoemulsification with intraocular lens placement; PRK photorefractive keratectomy; LASIK laser assisted in situ keratomileusis; HTN hypertension; DM diabetes mellitus; COPD chronic obstructive pulmonary disease

## 2023-09-16 ENCOUNTER — Encounter (INDEPENDENT_AMBULATORY_CARE_PROVIDER_SITE_OTHER): Payer: Self-pay | Admitting: Ophthalmology

## 2023-09-16 ENCOUNTER — Ambulatory Visit (INDEPENDENT_AMBULATORY_CARE_PROVIDER_SITE_OTHER): Payer: Medicare PPO | Admitting: Ophthalmology

## 2023-09-16 DIAGNOSIS — H34833 Tributary (branch) retinal vein occlusion, bilateral, with macular edema: Secondary | ICD-10-CM

## 2023-09-16 DIAGNOSIS — I10 Essential (primary) hypertension: Secondary | ICD-10-CM | POA: Diagnosis not present

## 2023-09-16 DIAGNOSIS — H35033 Hypertensive retinopathy, bilateral: Secondary | ICD-10-CM | POA: Diagnosis not present

## 2023-09-16 DIAGNOSIS — H25813 Combined forms of age-related cataract, bilateral: Secondary | ICD-10-CM

## 2023-09-16 DIAGNOSIS — H3509 Other intraretinal microvascular abnormalities: Secondary | ICD-10-CM | POA: Diagnosis not present

## 2023-09-16 MED ORDER — AFLIBERCEPT 2MG/0.05ML IZ SOLN FOR KALEIDOSCOPE
2.0000 mg | INTRAVITREAL | Status: AC | PRN
Start: 2023-09-16 — End: 2023-09-16
  Administered 2023-09-16: 2 mg via INTRAVITREAL

## 2023-09-16 MED ORDER — BEVACIZUMAB CHEMO INJECTION 1.25MG/0.05ML SYRINGE FOR KALEIDOSCOPE
1.2500 mg | INTRAVITREAL | Status: AC | PRN
Start: 2023-09-16 — End: 2023-09-16
  Administered 2023-09-16: 1.25 mg via INTRAVITREAL

## 2023-10-20 NOTE — Progress Notes (Signed)
[facility] - Clinic Note  10/21/2023     CHIEF COMPLAINT Patient presents for Retina Follow Up   HISTORY OF PRESENT ILLNESS: Shelley Mitchell is a 82 y.o. female who presents to the clinic today for:   HPI     Retina Follow Up   Patient presents with  CRVO/BRVO.  In both eyes.  This started 5 weeks ago.  I, the attending physician,  performed the HPI with the patient and updated documentation appropriately.        Comments   Patient here for 5 weeks retina follow up for BRVO OU. Patient states vision about the same. OD has eye soreness nasally. OS looks swollen sometimes.      Last edited by Rennis Chris, MD on 10/21/2023  2:50 PM.    Pt feels like left eye may be a little better  Referring physician: Mateo Flow, MD [address] [place],  [place] [address]  HISTORICAL INFORMATION:   Selected notes from the MEDICAL RECORD NUMBER Referred by Dr. Elmer Picker for concern of BDR / PDR LEE:  Ocular Hx- PMH-    CURRENT MEDICATIONS: Current Outpatient Medications (Ophthalmic Drugs)  Medication Sig   latanoprost (XALATAN) 0.005 % ophthalmic solution Place 1 drop into both eyes at bedtime.   No current facility-administered medications for this visit. (Ophthalmic Drugs)   Current Outpatient Medications (Other)  Medication Sig   acetaminophen (TYLENOL) 500 MG tablet Take 1,000 mg by mouth every 12 (twelve) hours.   albuterol (VENTOLIN HFA) 108 (90 Base) MCG/ACT inhaler Inhale 2 puffs into the lungs every 8 (eight) hours as needed for wheezing or shortness of breath.   Amino Acids-Protein Hydrolys (PRO-STAT AWC) LIQD Take 30 mLs by mouth 2 (two) times daily between meals.   amLODipine (NORVASC) 5 MG tablet Take 1 tablet (5 mg total) by mouth daily.   aspirin EC 81 MG tablet Take 81 mg by mouth daily. Swallow whole.   furosemide (LASIX) 20 MG tablet Take 20 mg by mouth daily.   levothyroxine (SYNTHROID) 25 MCG tablet Take 25 mcg by mouth daily  before breakfast.   losartan (COZAAR) 50 MG tablet Take 1 tablet (50 mg total) by mouth daily.   lubiprostone (AMITIZA) 8 MCG capsule Take 1 capsule (8 mcg total) by mouth daily with breakfast.   medroxyPROGESTERone (PROVERA) 10 MG tablet TAKE 1 TABLET BY MOUTH DAILY 10 DAYS PER MONTH (Patient taking differently: Take 10 mg by mouth See admin instructions. Take 10 mg once daily on the 1st-10th day of each month)   Multiple Vitamins-Minerals (MULTIVITAMIN WITH MINERALS) tablet Take 1 tablet by mouth daily.   nystatin powder Apply 1 Application topically 3 (three) times daily.   polyethylene glycol powder (GLYCOLAX/MIRALAX) 17 GM/SCOOP powder Take 17 g by mouth daily.   pravastatin (PRAVACHOL) 40 MG tablet Take 40 mg by mouth at bedtime.   Semaglutide-Weight Management 1 MG/0.5ML SOAJ Inject 1 mg into the skin once a week for 28 days.   [START ON 11/13/2023] Semaglutide-Weight Management 1.7 MG/0.75ML SOAJ Inject 1.7 mg into the skin once a week for 28 days.   [START ON 12/12/2023] Semaglutide-Weight Management 2.4 MG/0.75ML SOAJ Inject 2.4 mg into the skin once a week.   sertraline (ZOLOFT) 100 MG tablet Take 100 mg by mouth daily.   No current facility-administered medications for this visit. (Other)   REVIEW OF SYSTEMS: ROS   Positive for: Musculoskeletal, Cardiovascular, Eyes, Respiratory Last edited by Laddie Aquas, COA on 10/21/2023  9:27 AM.     ALLERGIES Allergies  Allergen Reactions   Ace Inhibitors Itching   Nsaids Itching   PAST MEDICAL HISTORY Past Medical History:  Diagnosis Date   Endometrial polyp    Dr. Despina Hidden   History of endometrial hyperplasia    HTN (hypertension)    Hx of adenomatous colonic polyps 1999   Hyperlipidemia    Mental disorder    GAD   Obesity    Osteoarthritis    Tachycardia    subsequently with bradycardia (?secondary to meds)   Past Surgical History:  Procedure Laterality Date   BACK SURGERY     bilateral knee replacements  2002/2003    CHOLECYSTECTOMY     COLONOSCOPY  03/2002   Dr. Fleet Contras internal hemorrhoids   COLONOSCOPY  06/21/2011   polyp at IC valve (tubular adenoma)   COLONOSCOPY N/A 07/15/2016   one 8 mm polyp at hepatic flexure (tubular adenoma), on 12 mm polyp at hepatic flexure (tubular adenoma), 3 year surveillance if health permits   COLONOSCOPY N/A 10/10/2019   descending colon diverticulosis, two 4-5 mm polyps at splenic flexure (tubular adenomas). No surveillance due to age.    LUMBAR LAMINECTOMY/DECOMPRESSION MICRODISCECTOMY N/A 04/27/2022   Procedure: Thoracic Eleven-Twelve Decompression;  Surgeon: Coletta Memos, MD;  Location: [facility] OR;  Service: Neurosurgery;  Laterality: N/A;   POLYPECTOMY  07/15/2016   Procedure: POLYPECTOMY;  Surgeon: Corbin Ade, MD;  Location: [facility];  Service: Endoscopy;;  Splenic Flexure polyps x 2 removed via hot snare   POLYPECTOMY  10/10/2019   Procedure: POLYPECTOMY;  Surgeon: Corbin Ade, MD;  Location: [facility];  Service: Endoscopy;;   TUBAL LIGATION     FAMILY HISTORY Family History  Problem Relation Age of Onset   Diabetes Brother    Colon polyps Brother    Diabetes Sister    Cancer Father        Likely prostate   Stroke Mother 47   Colon cancer Neg Hx    SOCIAL HISTORY Social History   Tobacco Use   Smoking status: Never   Smokeless tobacco: Never  Vaping Use   Vaping status: Never Used  Substance Use Topics   Alcohol use: No    Alcohol/week: 0.0 standard drinks of alcohol   Drug use: No       OPHTHALMIC EXAM:  Base Eye Exam     Visual Acuity (Snellen - Linear)       Right Left   Dist cc 20/50 -2 20/150 -1   Dist ph cc 20/40 +1 20/150 +2    Correction: Glasses         Tonometry (Tonopen, 9:24 AM)       Right Left   Pressure 17 13         Pupils       Dark Light Shape React APD   Right 3 2 Round Brisk None   Left 3 2 Round Brisk None         Visual Fields (Counting fingers)       Left Right    Full Full          Extraocular Movement       Right Left    Full, Ortho Full, Ortho         Neuro/Psych     Oriented x3: Yes   Mood/Affect: Normal         Dilation     Both eyes: 1.0% Mydriacyl, 2.5% Phenylephrine @ 9:24 AM  Slit Lamp and Fundus Exam     Slit Lamp Exam       Right Left   Lids/Lashes Dermatochalasis - upper lid Dermatochalasis - upper lid   Conjunctiva/Sclera mild melanosis mild melanosis   Cornea arcus arcus   Anterior Chamber deep and clear deep and clear   Iris Round and dilated Round and dilated   Lens 2+ Nuclear sclerosis, 2+ Cortical cataract 2-3+ Nuclear sclerosis, 2-3+ Cortical cataract   Anterior Vitreous Vitreous syneresis, Posterior vitreous detachment, vitreous condensations Vitreous syneresis, Posterior vitreous detachment         Fundus Exam       Right Left   Disc nasal hyperemia -- improved, sharp rim, PPA, mild temporal pallor, peripapillary subretinal heme superior and nasal to disc -- stably resolved Pink and Sharp, mild tilt, +PPA, no edema, focal heme at 0900   C/D Ratio 0.3 0.3   Macula Flat, good foveal reflex, inferior macula atrophic, RPE mottling and clumping, subretinal heme and edema superior macula -- improved Blunted foveal reflex, central and inferior IRH and exudates -- improved, central cystic changes -- persistent, retinal macroanuerysms inferior macula -- improved, scattered DBH -- improved, punctate exudates centrally -- improved, mild ERM   Vessels attenuated, Tortuous, macroanuerysms with heme along ST arcades --improving-- fibrosing and turning white attenuated, Tortuous, retinal macroaneurysms along IT arcades--improved   Periphery Attached, peripapillary subretinal heme superior and nasal to disc -- stably resolved Attached, scattered IRH--improved/essentially resolved, cluster of exudates IT periphery           Refraction     Wearing Rx       Sphere Cylinder Axis Add   Right -1.25 +0.50 163 +2.00    Left -0.75 +0.50 153 +2.00           IMAGING AND PROCEDURES  Imaging and Procedures for 10/21/2023  OCT, Retina - OU - Both Eyes       Right Eye Quality was good. Central Foveal Thickness: 232. Progression has been stable. Findings include normal foveal contour, no SRF, myopic contour, intraretinal hyper-reflective material, intraretinal fluid, subretinal fluid, inner retinal atrophy (stable improvement IRF/IRHM (macroanuerysm) along ST arcades, Diffuse IRA inferior macula / hemisphere -- remote BRAO; stable improvement in disc edema).   Left Eye Quality was good. Central Foveal Thickness: 476. Progression has been stable. Findings include abnormal foveal contour, myopic contour, retinal drusen , subretinal hyper-reflective material, intraretinal hyper-reflective material, intraretinal fluid, outer retinal atrophy (Persistent disc elevation, ERM with central thickening and ORA; drusen, SRHM, trace persistent IRF/IRHM inferior macula).   Notes *Images captured and stored on drive  Diagnosis / Impression:  OD: stable improvement IRF/IRHM (macroanuerysm) along ST arcades, Diffuse IRA inferior macula / hemisphere -- remote BRAO; stable improvement in disc edema OS: Persistent disc elevation, ERM with central thickening and ORA; drusen, SRHM, trace persistent IRF/IRHM inferiorl macula   Clinical management:  See below  Abbreviations: NFP - Normal foveal profile. CME - cystoid macular edema. PED - pigment epithelial detachment. IRF - intraretinal fluid. SRF - subretinal fluid. EZ - ellipsoid zone. ERM - epiretinal membrane. ORA - outer retinal atrophy. ORT - outer retinal tubulation. SRHM - subretinal hyper-reflective material. IRHM - intraretinal hyper-reflective material      Intravitreal Injection, Pharmacologic Agent - OD - Right Eye       Time Out 10/21/2023. 10:43 AM. Confirmed correct patient, procedure, site, and patient consented.   Anesthesia Topical anesthesia was  used. Anesthetic medications included Lidocaine 2%, Proparacaine 0.5%.   Procedure Preparation  included 5% betadine to ocular surface, eyelid speculum. A (33g) needle was used.   Injection: 1.25 mg Bevacizumab 1.25mg /0.57ml   Route: Intravitreal, Site: Right Eye   NDC: P3213405, Lot: [id number], Expiration date: 01/23/2024   Post-op Post injection exam found visual acuity of at least counting fingers. The patient tolerated the procedure well. There were no complications. The patient received written and verbal post procedure care education. Post injection medications were not given.      Intravitreal Injection, Pharmacologic Agent - OS - Left Eye       Time Out 10/21/2023. 10:49 AM. Confirmed correct patient, procedure, site, and patient consented.   Anesthesia Topical anesthesia was used. Anesthetic medications included Lidocaine 2%, Proparacaine 0.5%.   Procedure Preparation included 5% betadine to ocular surface, eyelid speculum. A (33g) needle was used.   Injection: 2 mg aflibercept 2 MG/0.05ML   Route: Intravitreal, Site: Left Eye   NDC: L6038910, Lot: [id number], Expiration date: 12/05/2024, Waste: 0 mL   Post-op Post injection exam found visual acuity of at least counting fingers. The patient tolerated the procedure well. There were no complications. The patient received written and verbal post procedure care education. Post injection medications were not given.            ASSESSMENT/PLAN:    ICD-10-CM   1. 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 Essential hypertension  I10     4. Hypertensive retinopathy of both eyes  H35.033     5. Combined forms of  age-related cataract of both eyes  H25.813      1,2. BRVO w/ CME and retinal macronauerysm OS - s/p IVA OS #1 (12.15.23) #2 (01.12.24), #3 (02.09.24), #4 (03.15.24), #5 (04.12.24), #6 (06.07.24) - s/p IVE OS #1 (05.10.24), #2 (07.08.24) #3(8.9.24), #4 (09.09.24), #5 (10.11.24) - pt previously followed with Dr. Fayrene Fearing in [place], [place], but now lives in a facility in [place] and cannot be transported to [facility] - history of laser photoablation of retinal macroaneurysm OS per chart review - FA 12.15.23 shows focal MAs w/ late leakage - BCVA OS 20/150 - stable - exam shows macroaneurysm with surrounding circinate exudates and edema, inferior macula OS -- improved - OCT shows OS: Persistent disc elevation, ERM with central thickening and ORA; drusen, SRHM, trace persistent IRF/IRHM inferior macula at 5 wks - recommend IVE OS #6 today, 11.15.24 w/ f/u in 5 wks - RBA of procedure discussed, questions answered - IVE informed consent obtained and signed, 06.07.24 (OU) - see procedure note - f/u 5 weeks-- DFE/OCT/possible injection  1,2. BRVO  w/ CME and retinal macroaneurysm OD - s/p IVA OD #1 (06.07.24) #2 (07.08.24), #3 (08.09.24), #4 (09.06.24), #5 (10.10.24) - OCT shows OD: stable improvement IRF/IRHM (macroanuerysm) along ST arcades, diffuse IRA inferior macula / hemisphere -- remote BRAO; stable improvement in disc edema at 5 wks - exam shows peripapillary hemorrhage, mild NVD nasal disc -- improving + new macroaneurysm w/ surrounding heme and edema along ST arcades -- improving  - FA 12.15.23 shows mild NVD nasal disc w/ leakage  - pt may benefit from PRP   - BCVA OD improved to  20/40 from 20/50   - recommend IVA OD #6 today, 11.15.24, for macular edema from BRVO w/ f/u in 5 wks again  - pt wishes to proceed with injection  - RBA of procedure discussed, questions answered - see procedure note - f/u 5 weeks, DFE, OCT  3,4. Hypertensive retinopathy OU  - pt and son report recent changes  in BP meds - discussed importance of tight BP control and likely relationship with BRVOs  5. Mixed Cataract OU - The symptoms of cataract, surgical options, and treatments and risks were discussed with patient. - discussed diagnosis and progression - likely visually significant - under the expert management of Dr. Elmer Picker  - referred to [facility] for cataract eval on 12.05.24 - clear from a retina standpoint to proceed with cataract surgery when pt and surgeon are ready  - would recommend timing cataract surgery ~1-2 wks after anti-VEGF injection  Ophthalmic Meds Ordered this visit:  Meds ordered this encounter  Medications   aflibercept (EYLEA) SOLN 2 mg   Bevacizumab (AVASTIN) SOLN 1.25 mg     Return in about 5 weeks (around 11/25/2023) for f/u BRVO OS, DFE, OCT.  There are no Patient Instructions on file for this visit.   Electronically signed by: Berlin Hun COT 11.14.24 1:29 AM  This document serves as a record of services personally performed by Karie Chimera, MD, PhD. It was created on their behalf by Glee Arvin. Manson Passey, OA an ophthalmic technician. The creation of this record is the provider's dictation and/or activities during the visit.    Electronically signed by: Glee Arvin. Manson Passey, OA 10/23/23 1:29 AM  Karie Chimera, M.D., Ph.D. Diseases & Surgery of the Retina and Vitreous [facility] 10/21/2023   I have reviewed the above documentation for accuracy and completeness, and I agree with the above. Karie Chimera, M.D., Ph.D. 10/23/23 1:36 AM   Abbreviations: M myopia (nearsighted); A astigmatism; H hyperopia (farsighted); P presbyopia; Mrx spectacle prescription;  CTL contact lenses; OD right eye; OS left eye; OU both eyes  XT exotropia; ET esotropia; PEK punctate epithelial keratitis; PEE punctate epithelial erosions; DES dry eye syndrome; MGD meibomian gland dysfunction; ATs artificial tears; PFAT's preservative free artificial tears; NSC  nuclear sclerotic cataract; PSC posterior subcapsular cataract; ERM epi-retinal membrane; PVD posterior vitreous detachment; RD retinal detachment; DM diabetes mellitus; DR diabetic retinopathy; NPDR non-proliferative diabetic retinopathy; PDR proliferative diabetic retinopathy; CSME clinically significant macular edema; DME diabetic macular edema; dbh dot blot hemorrhages; CWS cotton wool spot; POAG primary open angle glaucoma; C/D cup-to-disc ratio; HVF humphrey visual field; GVF goldmann visual field; OCT optical coherence tomography; IOP intraocular pressure; BRVO Branch retinal vein occlusion; CRVO central retinal vein occlusion; CRAO central retinal artery occlusion; BRAO branch retinal artery occlusion; RT retinal tear; SB scleral buckle; PPV pars plana vitrectomy; VH Vitreous hemorrhage; PRP panretinal laser photocoagulation; IVK intravitreal kenalog; VMT vitreomacular traction; MH Macular hole;  NVD neovascularization of the disc; NVE neovascularization elsewhere; AREDS age related eye disease study; ARMD age related macular degeneration; POAG primary open angle glaucoma; EBMD epithelial/anterior basement membrane dystrophy; ACIOL anterior chamber intraocular lens; IOL intraocular lens; PCIOL posterior chamber intraocular lens; Phaco/IOL phacoemulsification with intraocular lens placement; PRK photorefractive keratectomy; LASIK laser assisted in situ keratomileusis; HTN hypertension; DM diabetes mellitus; COPD chronic obstructive pulmonary disease

## 2023-10-21 ENCOUNTER — Ambulatory Visit (INDEPENDENT_AMBULATORY_CARE_PROVIDER_SITE_OTHER): Payer: Medicare PPO | Admitting: Ophthalmology

## 2023-10-21 ENCOUNTER — Encounter (INDEPENDENT_AMBULATORY_CARE_PROVIDER_SITE_OTHER): Payer: Self-pay | Admitting: Ophthalmology

## 2023-10-21 DIAGNOSIS — H34833 Tributary (branch) retinal vein occlusion, bilateral, with macular edema: Secondary | ICD-10-CM | POA: Diagnosis not present

## 2023-10-21 DIAGNOSIS — H35033 Hypertensive retinopathy, bilateral: Secondary | ICD-10-CM

## 2023-10-21 DIAGNOSIS — H25813 Combined forms of age-related cataract, bilateral: Secondary | ICD-10-CM

## 2023-10-21 DIAGNOSIS — H3509 Other intraretinal microvascular abnormalities: Secondary | ICD-10-CM | POA: Diagnosis not present

## 2023-10-21 DIAGNOSIS — I10 Essential (primary) hypertension: Secondary | ICD-10-CM | POA: Diagnosis not present

## 2023-10-21 MED ORDER — AFLIBERCEPT 2MG/0.05ML IZ SOLN FOR KALEIDOSCOPE
2.0000 mg | INTRAVITREAL | Status: AC | PRN
  Administered 2023-10-21: 2 mg via INTRAVITREAL

## 2023-10-21 MED ORDER — BEVACIZUMAB CHEMO INJECTION 1.25MG/0.05ML SYRINGE FOR KALEIDOSCOPE
1.2500 mg | INTRAVITREAL | Status: AC | PRN
  Administered 2023-10-21: 1.25 mg via INTRAVITREAL

## 2023-11-23 NOTE — Progress Notes (Signed)
[facility] - Clinic Note  11/25/2023     CHIEF COMPLAINT Patient presents for Retina Follow Up   HISTORY OF PRESENT ILLNESS: Shelley Mitchell is a 82 y.o. female who presents to the clinic today for:   HPI     Retina Follow Up   Patient presents with  CRVO/BRVO.  In left eye.  This started 5 weeks ago.  I, the attending physician,  performed the HPI with the patient and updated documentation appropriately.        Comments   Patient here for 5 weeks retina follow up for BRVO OS. Patient states vision  some days better than other days. No eye pain.      Last edited by Rennis Chris, MD on 11/25/2023  1:07 PM.    Pt states sometimes she feels like vision is better in her left eye, she saw Dr. 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 Elmer Picker for concern of BDR / PDR LEE:  Ocular Hx- PMH-    CURRENT MEDICATIONS: Current Outpatient Medications (Ophthalmic Drugs)  Medication Sig   latanoprost (XALATAN) 0.005 % ophthalmic solution Place 1 drop into both eyes at bedtime.   No current facility-administered medications for this visit. (Ophthalmic Drugs)   Current Outpatient Medications (Other)  Medication Sig   acetaminophen (TYLENOL) 500 MG tablet Take 1,000 mg by mouth every 12 (twelve) hours.   albuterol (VENTOLIN HFA) 108 (90 Base) MCG/ACT inhaler Inhale 2 puffs into the lungs every 8 (eight) hours as needed for wheezing or shortness of breath.   Amino Acids-Protein Hydrolys (PRO-STAT AWC) LIQD Take 30 mLs by mouth 2 (two) times daily between meals.   amLODipine (NORVASC) 5 MG tablet Take 1 tablet (5 mg total) by mouth daily.   aspirin EC 81 MG tablet  Take 81 mg by mouth daily. Swallow whole.   furosemide (LASIX) 20 MG tablet Take 20 mg by mouth daily.   levothyroxine (SYNTHROID) 25 MCG tablet Take 25 mcg by mouth daily before breakfast.   losartan (COZAAR) 50 MG tablet Take 1 tablet (50 mg total) by mouth daily.   lubiprostone (AMITIZA) 8 MCG capsule Take 1 capsule (8 mcg total) by mouth daily with breakfast.   medroxyPROGESTERone (PROVERA) 10 MG tablet TAKE 1 TABLET BY MOUTH DAILY 10 DAYS PER MONTH (Patient taking differently: Take 10 mg by mouth See admin instructions. Take 10 mg once daily on the 1st-10th day of each month)   Multiple Vitamins-Minerals (MULTIVITAMIN WITH MINERALS) tablet Take 1 tablet by mouth daily.   nystatin powder Apply 1 Application topically 3 (three) times daily.   polyethylene glycol powder (GLYCOLAX/MIRALAX) 17 GM/SCOOP powder Take 17 g by mouth daily.   pravastatin (PRAVACHOL) 40 MG tablet Take 40 mg by mouth at bedtime.   Semaglutide-Weight Management 1.7 MG/0.75ML SOAJ Inject 1.7 mg into the skin once a week for 28 days.   [START ON 12/12/2023] Semaglutide-Weight Management 2.4 MG/0.75ML SOAJ Inject 2.4 mg into the skin once a week.   sertraline (ZOLOFT) 100 MG tablet Take 100 mg by mouth daily.   No current facility-administered medications for this visit. (Other)   REVIEW OF SYSTEMS: ROS   Positive for: Musculoskeletal,  Cardiovascular, Eyes, Respiratory Last edited by Laddie Aquas, COA on 11/25/2023  1:06 PM.      ALLERGIES Allergies  Allergen Reactions   Ace Inhibitors Itching   Nsaids Itching   PAST MEDICAL HISTORY Past Medical History:  Diagnosis Date   Endometrial polyp    Dr. Despina Hidden   History of endometrial hyperplasia    HTN (hypertension)    Hx of adenomatous colonic polyps 1999   Hyperlipidemia    Mental disorder    GAD   Obesity    Osteoarthritis    Tachycardia    subsequently with bradycardia (?secondary to meds)   Past Surgical History:  Procedure Laterality Date   BACK  SURGERY     bilateral knee replacements  2002/2003   CHOLECYSTECTOMY     COLONOSCOPY  03/2002   Dr. Fleet Contras internal hemorrhoids   COLONOSCOPY  06/21/2011   polyp at IC valve (tubular adenoma)   COLONOSCOPY N/A 07/15/2016   one 8 mm polyp at hepatic flexure (tubular adenoma), on 12 mm polyp at hepatic flexure (tubular adenoma), 3 year surveillance if health permits   COLONOSCOPY N/A 10/10/2019   descending colon diverticulosis, two 4-5 mm polyps at splenic flexure (tubular adenomas). No surveillance due to age.    LUMBAR LAMINECTOMY/DECOMPRESSION MICRODISCECTOMY N/A 04/27/2022   Procedure: Thoracic Eleven-Twelve Decompression;  Surgeon: Coletta Memos, MD;  Location: [facility];  Service: Neurosurgery;  Laterality: N/A;   POLYPECTOMY  07/15/2016   Procedure: POLYPECTOMY;  Surgeon: Corbin Ade, MD;  Location: [facility];  Service: Endoscopy;;  Splenic Flexure polyps x 2 removed via hot snare   POLYPECTOMY  10/10/2019   Procedure: POLYPECTOMY;  Surgeon: Corbin Ade, MD;  Location: [facility];  Service: Endoscopy;;   TUBAL LIGATION     FAMILY HISTORY Family History  Problem Relation Age of Onset   Diabetes Brother    Colon polyps Brother    Diabetes Sister    Cancer Father        Likely prostate   Stroke Mother 70   Colon cancer Neg Hx    SOCIAL HISTORY Social History   Tobacco Use   Smoking status: Never   Smokeless tobacco: Never  Vaping Use   Vaping status: Never Used  Substance Use Topics   Alcohol use: No    Alcohol/week: 0.0 standard drinks of alcohol   Drug use: No       OPHTHALMIC EXAM:  Base Eye Exam     Visual Acuity (Snellen - Linear)       Right Left   Dist cc 20/30 -1 20/150   Dist ph cc NI NI    Correction: Glasses         Tonometry (Tonopen, 1:04 PM)       Right Left   Pressure 12 10         Pupils       Dark Light Shape React APD   Right 3 2 Round Brisk None   Left 3 2 Round Brisk None         Visual Fields (Counting  fingers)       Left Right    Full Full         Extraocular Movement       Right Left    Full, Ortho Full, Ortho         Neuro/Psych     Oriented x3: Yes   Mood/Affect: Normal         Dilation  Both eyes: 1.0% Mydriacyl, 2.5% Phenylephrine @ 1:04 PM           Slit Lamp and Fundus Exam     Slit Lamp Exam       Right Left   Lids/Lashes Dermatochalasis - upper lid Dermatochalasis - upper lid   Conjunctiva/Sclera mild melanosis mild melanosis   Cornea arcus arcus   Anterior Chamber deep and clear deep and clear   Iris Round and dilated Round and dilated   Lens 2+ Nuclear sclerosis, 2+ Cortical cataract 2-3+ Nuclear sclerosis, 2-3+ Cortical cataract   Anterior Vitreous Vitreous syneresis, Posterior vitreous detachment, vitreous condensations Vitreous syneresis, Posterior vitreous detachment         Fundus Exam       Right Left   Disc nasal hyperemia -- improved, sharp rim, PPA, mild temporal pallor, no heme or disc edema Pink and Sharp, mild tilt, +PPA, no edema, focal heme at 0900   C/D Ratio 0.3 0.3   Macula Flat, good foveal reflex, inferior macula atrophic, RPE mottling and clumping, subretinal heme and edema superior macula -- stably improved Blunted foveal reflex, persistent central and inferior IRH and exudates, central cystic changes -- improved, mild ERM   Vessels attenuated, Tortuous, macroanuerysms with heme along ST arcades --improved -- fibrosing and turning white attenuated, Tortuous, retinal macroaneurysms along IT arcades--improved   Periphery Attached, peripapillary subretinal heme superior and nasal to disc -- stably resolved Attached, scattered IRH--improved/essentially resolved, cluster of exudates IT periphery           Refraction     Wearing Rx       Sphere Cylinder Axis Add   Right -1.25 +0.50 163 +2.00   Left -0.75 +0.50 153 +2.00           IMAGING AND PROCEDURES  Imaging and Procedures for 11/25/2023  OCT, Retina -  OU - Both Eyes       Right Eye Quality was good. Central Foveal Thickness: 234. Progression has been stable. Findings include normal foveal contour, no SRF, myopic contour, intraretinal hyper-reflective material, intraretinal fluid, subretinal fluid, inner retinal atrophy (stable improvement IRF/IRHM (macroanuerysm) along ST arcades, Diffuse IRA inferior macula / hemisphere -- remote BRAO; stable improvement in disc edema).   Left Eye Quality was good. Central Foveal Thickness: 443. Progression has improved. Findings include abnormal foveal contour, myopic contour, retinal drusen , subretinal hyper-reflective material, intraretinal hyper-reflective material, intraretinal fluid, outer retinal atrophy (Persistent disc elevation -- slightly increased, ERM with central thickening and ORA; drusen, SRHM, trace persistent IRF/IRHM inferior macula -- improved).   Notes *Images captured and stored on drive  Diagnosis / Impression:  OD: stable improvement IRF/IRHM (macroanuerysm) along ST arcades, Diffuse IRA inferior macula / hemisphere -- remote BRAO; stable improvement in disc edema OS: Persistent disc elevation -- slightly increased, ERM with central thickening and ORA; drusen, SRHM, trace persistent IRF/IRHM inferior macula -- improved   Clinical management:  See below  Abbreviations: NFP - Normal foveal profile. CME - cystoid macular edema. PED - pigment epithelial detachment. IRF - intraretinal fluid. SRF - subretinal fluid. EZ - ellipsoid zone. ERM - epiretinal membrane. ORA - outer retinal atrophy. ORT - outer retinal tubulation. SRHM - subretinal hyper-reflective material. IRHM - intraretinal hyper-reflective material      Intravitreal Injection, Pharmacologic Agent - OS - Left Eye       Time Out 11/25/2023. 1:55 PM. Confirmed correct patient, procedure, site, and patient consented.   Anesthesia Topical anesthesia was used. Anesthetic medications included Lidocaine  2%, Proparacaine  0.5%.   Procedure Preparation included 5% betadine to ocular surface, eyelid speculum. A (33g) needle was used.   Injection: 2 mg aflibercept 2 MG/0.05ML   Route: Intravitreal, Site: Left Eye   NDC: L6038910, Lot: [id number], Expiration date: 04/03/2025, Waste: 0 mL   Post-op Post injection exam found visual acuity of at least counting fingers. The patient tolerated the procedure well. There were no complications. The patient received written and verbal post procedure care education. Post injection medications were not given.            ASSESSMENT/PLAN:    ICD-10-CM   1. 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 Essential hypertension  I10     4. Hypertensive retinopathy of both eyes  H35.033     5. Combined forms of age-related cataract of both eyes  H25.813      1,2. BRVO w/ CME and retinal macronauerysm OS - s/p IVA OS #1 (12.15.23) #2 (01.12.24), #3 (02.09.24), #4 (03.15.24), #5 (04.12.24), #6 (06.07.24) - s/p IVE OS #1 (05.10.24), #2 (07.08.24) #3(8.9.24), #4 (09.09.24), #5 (10.11.24), #6 (11.15.24) - pt previously followed with Dr. Fayrene Fearing in [place], [place], but now lives in a facility in [place] and cannot be transported to [facility] - history of laser photoablation of retinal macroaneurysm OS per chart review - FA 12.15.23 shows focal MAs w/ late leakage - BCVA OS 20/150 - stable - exam shows macroaneurysm with surrounding circinate exudates and edema, inferior macula OS -- improved - OCT shows MW:[id number] disc elevation -- slightly increased, ERM with central thickening and ORA; drusen, SRHM, trace persistent IRF/IRHM inferior macula -- improved 5 wks - recommend IVE OS #7 today, 12.20.24 w/ f/u in 5 wks - RBA of procedure discussed, questions answered - IVE informed  consent obtained and signed, 06.07.24 (OU) - see procedure note - f/u 5 weeks-- DFE/OCT/possible injection  1,2. BRVO  w/ CME and retinal macroaneurysm OD - s/p IVA OD #1 (06.07.24) #2 (07.08.24), #3 (08.09.24), #4 (09.06.24), #5 (10.10.24), #6 (11.15.24) - OCT shows OD: stable improvement IRF/IRHM (macroanuerysm) along ST arcades, Diffuse IRA inferior macula / hemisphere -- remote BRAO; stable improvement in disc edema at 5 wks - exam shows peripapillary hemorrhage, mild NVD nasal disc -- improving + new macroaneurysm w/ surrounding heme and edema along ST arcades -- improved  - FA 12.15.23 shows mild NVD nasal disc w/ leakage  - pt may benefit from PRP   - BCVA OD stable at 20/40   - recommend holding injection today -- pt in agreement - f/u 5 weeks, DFE, OCT  3,4. Hypertensive retinopathy OU  - pt and son report recent changes in BP meds - discussed importance of tight BP control and likely relationship with BRVOs  5. Mixed Cataract OU - The symptoms of cataract, surgical options, and treatments and risks were discussed with patient. - discussed diagnosis and progression - likely visually significant - under the expert management of Dr. Elmer Picker  - had appt with Dr. Steele Berg on November 10, 2023 who did not recommend cataract surgery at this time - clear from a retina standpoint to proceed with cataract surgery when pt and surgeon are ready  - would recommend timing cataract surgery ~1-2 wks after anti-VEGF injection  Ophthalmic Meds Ordered this visit:  Meds ordered this encounter  Medications   aflibercept (EYLEA) SOLN 2 mg     Return in about 5 weeks (around 12/30/2023) for f/u BRVO OU, DFE, OCT.  There are no Patient Instructions on file for this visit.   This document serves as a record of services personally performed by Karie Chimera, MD, PhD. It was created on their behalf by Berlin Hun COT, an ophthalmic technician. The creation of this record is the  provider's dictation and/or activities during the visit.    Electronically signed by: Berlin Hun COT 12.18.24 3:08 PM  This document serves as a record of services personally performed by Karie Chimera, MD, PhD. It was created on their behalf by Glee Arvin. Manson Passey, OA an ophthalmic technician. The creation of this record is the provider's dictation and/or activities during the visit.    Electronically signed by: Glee Arvin. Manson Passey, OA 11/25/23 3:08 PM  Karie Chimera, M.D., Ph.D. Diseases & Surgery of the Retina and Vitreous [facility] 11/25/2023   I have reviewed the above documentation for accuracy and completeness, and I agree with the above. Karie Chimera, M.D., Ph.D. 11/25/23 3:08 PM   Abbreviations: M myopia (nearsighted); A astigmatism; H hyperopia (farsighted); P presbyopia; Mrx spectacle prescription;  CTL contact lenses; OD right eye; OS left eye; OU both eyes  XT exotropia; ET esotropia; PEK punctate epithelial keratitis; PEE punctate epithelial erosions; DES dry eye syndrome; MGD meibomian gland dysfunction; ATs artificial tears; PFAT's preservative free artificial tears; NSC nuclear sclerotic cataract; PSC posterior subcapsular cataract; ERM epi-retinal membrane; PVD posterior vitreous detachment; RD retinal detachment; DM diabetes mellitus; DR diabetic retinopathy; NPDR non-proliferative diabetic retinopathy; PDR proliferative diabetic retinopathy; CSME clinically significant macular edema; DME diabetic macular edema; dbh dot blot hemorrhages; CWS cotton wool spot; POAG primary open angle glaucoma; C/D cup-to-disc ratio; HVF humphrey visual field; GVF goldmann visual field; OCT optical coherence tomography; IOP intraocular pressure; BRVO Branch retinal vein occlusion; CRVO central retinal vein occlusion; CRAO central retinal artery occlusion; BRAO branch retinal artery occlusion; RT retinal tear; SB scleral buckle; PPV pars plana vitrectomy; VH Vitreous  hemorrhage; PRP panretinal laser photocoagulation; IVK intravitreal kenalog; VMT vitreomacular traction; MH Macular hole;  NVD neovascularization of the disc; NVE neovascularization elsewhere; AREDS age related eye disease study; ARMD age related macular degeneration; POAG primary open angle glaucoma; EBMD epithelial/anterior basement membrane dystrophy; ACIOL anterior chamber intraocular lens; IOL intraocular lens; PCIOL posterior chamber intraocular lens; Phaco/IOL phacoemulsification with intraocular lens placement; PRK photorefractive keratectomy; LASIK laser assisted in situ keratomileusis; HTN hypertension; DM diabetes mellitus; COPD chronic obstructive pulmonary disease

## 2023-11-25 ENCOUNTER — Ambulatory Visit (INDEPENDENT_AMBULATORY_CARE_PROVIDER_SITE_OTHER): Payer: Medicare PPO | Admitting: Ophthalmology

## 2023-11-25 ENCOUNTER — Encounter (INDEPENDENT_AMBULATORY_CARE_PROVIDER_SITE_OTHER): Payer: Self-pay | Admitting: Ophthalmology

## 2023-11-25 DIAGNOSIS — I10 Essential (primary) hypertension: Secondary | ICD-10-CM | POA: Diagnosis not present

## 2023-11-25 DIAGNOSIS — H3509 Other intraretinal microvascular abnormalities: Secondary | ICD-10-CM

## 2023-11-25 DIAGNOSIS — H35033 Hypertensive retinopathy, bilateral: Secondary | ICD-10-CM | POA: Diagnosis not present

## 2023-11-25 DIAGNOSIS — H34833 Tributary (branch) retinal vein occlusion, bilateral, with macular edema: Secondary | ICD-10-CM

## 2023-11-25 DIAGNOSIS — H25813 Combined forms of age-related cataract, bilateral: Secondary | ICD-10-CM

## 2023-11-25 MED ORDER — AFLIBERCEPT 2MG/0.05ML IZ SOLN FOR KALEIDOSCOPE
2.0000 mg | INTRAVITREAL | Status: AC | PRN
  Administered 2023-11-25: 2 mg via INTRAVITREAL

## 2023-12-28 NOTE — Progress Notes (Signed)
[facility] - Clinic Note  12/30/2023     CHIEF COMPLAINT Patient presents for Retina Follow Up   HISTORY OF PRESENT ILLNESS: Shelley Mitchell is a 83 y.o. female who presents to the clinic today for:   HPI     Retina Follow Up   Patient presents with  CRVO/BRVO.  In both eyes.  This started 5 weeks ago.  I, the attending physician,  performed the HPI with the patient and updated documentation appropriately.        Comments   Patient here for 5 weeks retina follow up for BRVO OU. Patient states visio  OS not doing good. When closes OD, OS has a dark area like when looking at the clock.  Had eye pain OS a bottom of eye. Had itchy eyes used OTC AT.       Last edited by Rennis Chris, MD on 12/30/2023 11:38 AM.     Patient states the vision in the left eye has a black spot in it -- central and inferior paracentral visual field affected -- started 3 days ago. Patient states she had a spike in blood pressure to max systolic BP 225.   Referring physician: Mateo Flow, MD [address] [place],  [place] [address]  HISTORICAL INFORMATION:   Selected notes from the MEDICAL RECORD NUMBER Referred by Dr. Elmer Picker for concern of BDR / PDR LEE:  Ocular Hx- PMH-    CURRENT MEDICATIONS: Current Outpatient Medications (Ophthalmic Drugs)  Medication Sig   latanoprost (XALATAN) 0.005 % ophthalmic solution Place 1 drop into both eyes at bedtime.   No current facility-administered medications for this visit. (Ophthalmic Drugs)   Current Outpatient Medications (Other)  Medication Sig   acetaminophen (TYLENOL) 500 MG tablet Take 1,000 mg by mouth every 12 (twelve) hours.   albuterol (VENTOLIN HFA) 108 (90 Base) MCG/ACT inhaler Inhale 2 puffs into the lungs every 8 (eight) hours as needed for wheezing or shortness of breath.   Amino Acids-Protein Hydrolys (PRO-STAT AWC) LIQD Take 30 mLs by mouth 2 (two) times daily between meals.   amLODipine (NORVASC) 5 MG  tablet Take 1 tablet (5 mg total) by mouth daily.   aspirin EC 81 MG tablet Take 81 mg by mouth daily. Swallow whole.   furosemide (LASIX) 20 MG tablet Take 20 mg by mouth daily.   levothyroxine (SYNTHROID) 25 MCG tablet Take 25 mcg by mouth daily before breakfast.   losartan (COZAAR) 50 MG tablet Take 1 tablet (50 mg total) by mouth daily.   lubiprostone (AMITIZA) 8 MCG capsule Take 1 capsule (8 mcg total) by mouth daily with breakfast.   medroxyPROGESTERone (PROVERA) 10 MG tablet TAKE 1 TABLET BY MOUTH DAILY 10 DAYS PER MONTH (Patient taking differently: Take 10 mg by mouth See admin instructions. Take 10 mg once daily on the 1st-10th day of each month)   Multiple Vitamins-Minerals (MULTIVITAMIN WITH MINERALS) tablet Take 1 tablet by mouth daily.   nystatin powder Apply 1 Application topically 3 (three) times daily.   polyethylene glycol powder (GLYCOLAX/MIRALAX) 17 GM/SCOOP powder Take 17 g by mouth daily.   pravastatin (PRAVACHOL) 40 MG tablet Take 40 mg by mouth at bedtime.   Semaglutide-Weight Management 2.4 MG/0.75ML SOAJ Inject 2.4 mg into the skin once a week.   sertraline (ZOLOFT) 100 MG tablet Take 100 mg by mouth daily.   No current facility-administered medications for this visit. (Other)   REVIEW OF SYSTEMS: ROS   Positive for: Musculoskeletal, Cardiovascular,  Eyes, Respiratory Last edited by Laddie Aquas, COA on 12/30/2023  9:53 AM.       ALLERGIES Allergies  Allergen Reactions   Ace Inhibitors Itching   Nsaids Itching   PAST MEDICAL HISTORY Past Medical History:  Diagnosis Date   Endometrial polyp    Dr. Despina Hidden   History of endometrial hyperplasia    HTN (hypertension)    Hx of adenomatous colonic polyps 1999   Hyperlipidemia    Mental disorder    GAD   Obesity    Osteoarthritis    Tachycardia    subsequently with bradycardia (?secondary to meds)   Past Surgical History:  Procedure Laterality Date   BACK SURGERY     bilateral knee replacements   2002/2003   CHOLECYSTECTOMY     COLONOSCOPY  03/2002   Dr. Fleet Contras internal hemorrhoids   COLONOSCOPY  06/21/2011   polyp at IC valve (tubular adenoma)   COLONOSCOPY N/A 07/15/2016   one 8 mm polyp at hepatic flexure (tubular adenoma), on 12 mm polyp at hepatic flexure (tubular adenoma), 3 year surveillance if health permits   COLONOSCOPY N/A 10/10/2019   descending colon diverticulosis, two 4-5 mm polyps at splenic flexure (tubular adenomas). No surveillance due to age.    LUMBAR LAMINECTOMY/DECOMPRESSION MICRODISCECTOMY N/A 04/27/2022   Procedure: Thoracic Eleven-Twelve Decompression;  Surgeon: Coletta Memos, MD;  Location: [facility] OR;  Service: Neurosurgery;  Laterality: N/A;   POLYPECTOMY  07/15/2016   Procedure: POLYPECTOMY;  Surgeon: Corbin Ade, MD;  Location: [facility];  Service: Endoscopy;;  Splenic Flexure polyps x 2 removed via hot snare   POLYPECTOMY  10/10/2019   Procedure: POLYPECTOMY;  Surgeon: Corbin Ade, MD;  Location: [facility];  Service: Endoscopy;;   TUBAL LIGATION     FAMILY HISTORY Family History  Problem Relation Age of Onset   Diabetes Brother    Colon polyps Brother    Diabetes Sister    Cancer Father        Likely prostate   Stroke Mother 61   Colon cancer Neg Hx    SOCIAL HISTORY Social History   Tobacco Use   Smoking status: Never   Smokeless tobacco: Never  Vaping Use   Vaping status: Never Used  Substance Use Topics   Alcohol use: No    Alcohol/week: 0.0 standard drinks of alcohol   Drug use: No       OPHTHALMIC EXAM:  Base Eye Exam     Visual Acuity (Snellen - Linear)       Right Left   Dist cc 20/30 +1 20/150 -1   Dist ph cc NI NI    Correction: Glasses         Tonometry (Tonopen, 9:50 AM)       Right Left   Pressure 12 08         Pupils       Dark Light Shape React APD   Right 3 2 Round Brisk None   Left 3 2 Round Brisk None         Visual Fields (Counting fingers)       Left Right    Full Full          Extraocular Movement       Right Left    Full, Ortho Full, Ortho         Neuro/Psych     Oriented x3: Yes   Mood/Affect: Normal         Dilation  Both eyes: 1.0% Mydriacyl, 2.5% Phenylephrine @ 9:50 AM           Slit Lamp and Fundus Exam     Slit Lamp Exam       Right Left   Lids/Lashes Dermatochalasis - upper lid Dermatochalasis - upper lid   Conjunctiva/Sclera mild melanosis mild melanosis   Cornea arcus arcus   Anterior Chamber deep and clear deep and clear   Iris Round and dilated Round and dilated   Lens 2+ Nuclear sclerosis, 2+ Cortical cataract 2-3+ Nuclear sclerosis, 2-3+ Cortical cataract   Anterior Vitreous Vitreous syneresis, Posterior vitreous detachment, vitreous condensations Vitreous syneresis, Posterior vitreous detachment         Fundus Exam       Right Left   Disc nasal hyperemia -- improved, sharp rim, PPA, mild temporal pallor, no heme or disc edema Pink and Sharp, mild tilt, +PPA, no edema, focal hyperimia heme superiorly, ST PP hemes   C/D Ratio 0.3 0.3   Macula Flat, good foveal reflex, inferior macula atrophic, RPE mottling and clumping, subretinal heme and edema superior macula -- stably improved Blunted foveal reflex, persistent central and inferior IRH and exudates- slightly improced, central cystic changes -- improved, mild ERM, new patch of focal retinal whitening superior to fovea, flames hemes SN mac   Vessels attenuated, Tortuous, macroanuerysms with heme along ST arcades --improved -- fibrosing and turning white attenuated, Tortuous, retinal macroaneurysms along IT arcades--improved   Periphery Attached, peripapillary subretinal heme superior and nasal to disc -- stably resolved Attached, scattered IRH--improved/essentially resolved, cluster of exudates IT periphery           Refraction     Wearing Rx       Sphere Cylinder Axis Add   Right -1.25 +0.50 163 +2.00   Left -0.75 +0.50 153 +2.00            IMAGING AND PROCEDURES  Imaging and Procedures for 12/30/2023  OCT, Retina - OU - Both Eyes       Right Eye Quality was good. Central Foveal Thickness: 231. Progression has been stable. Findings include normal foveal contour, no SRF, myopic contour, intraretinal hyper-reflective material, intraretinal fluid, subretinal fluid, inner retinal atrophy (stable improvement IRF/IRHM (macroanuerysm) along ST arcades, Diffuse IRA inferior macula / hemisphere -- remote BRAO; stable improvement in disc edema).   Left Eye Quality was good. Central Foveal Thickness: 459. Progression has worsened. Findings include abnormal foveal contour, myopic contour, retinal drusen , subretinal hyper-reflective material, intraretinal hyper-reflective material, intraretinal fluid, outer retinal atrophy (Persistent disc elevation -- slightly increased, ERM with central thickening and ORA; drusen, SRHM, persistent IRF/IRHM inferior macula -- improved, new inner retinal hyperreflectivy and edema of superior paracentral macula and fovea -- new BRAO).   Notes *Images captured and stored on drive  Diagnosis / Impression:  OD: stable improvement IRF/IRHM (macroanuerysm) along ST arcades, Diffuse IRA inferior macula / hemisphere -- remote BRAO; stable improvement in disc edema OS: Persistent disc elevation -- slightly increased, ERM with central thickening and ORA; drusen, SRHM, persistent IRF/IRHM inferior macula -- improved, new inner retinal hyperreflectivy and edema of superior paracentral macula and fovea -- new BRAO  Clinical management:  See below  Abbreviations: NFP - Normal foveal profile. CME - cystoid macular edema. PED - pigment epithelial detachment. IRF - intraretinal fluid. SRF - subretinal fluid. EZ - ellipsoid zone. ERM - epiretinal membrane. ORA - outer retinal atrophy. ORT - outer retinal tubulation. SRHM - subretinal hyper-reflective material. IRHM - intraretinal hyper-reflective  material       Intravitreal Injection, Pharmacologic Agent - OS - Left Eye       Time Out 12/30/2023. 10:56 AM. Confirmed correct patient, procedure, site, and patient consented.   Anesthesia Topical anesthesia was used. Anesthetic medications included Lidocaine 2%, Proparacaine 0.5%.   Procedure Preparation included 5% betadine to ocular surface, eyelid speculum. A (33g) needle was used.   Injection: 2 mg aflibercept 2 MG/0.05ML   Route: Intravitreal, Site: Left Eye   NDC: L6038910, Lot: [id number], Expiration date: 04/04/2025, Waste: 0 mL   Post-op Post injection exam found visual acuity of at least counting fingers. The patient tolerated the procedure well. There were no complications. The patient received written and verbal post procedure care education. Post injection medications were not given.             ASSESSMENT/PLAN:    ICD-10-CM   1. 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 Essential hypertension  I10     4. Hypertensive retinopathy of both eyes  H35.033     5. Combined forms of age-related cataract of both eyes  H25.813     6. 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 BRVO w/ CME and retinal macronauerysm OS - s/p IVA OS #1 (12.15.23) #2 (01.12.24), #3 (02.09.24), #4 (03.15.24), #5 (04.12.24), #6 (06.07.24) ============================= - s/p IVE OS #1 (05.10.24), #2 (07.08.24) #3(8.9.24), #4 (09.09.24), #5 (10.11.24), #6 (11.15.24), #7 (12.20.24) - pt previously followed with Dr. Fayrene Fearing in [place], [place], but now lives in a facility in [place] and cannot be transported to [facility] - history of laser photoablation of retinal macroaneurysm OS per chart review - FA 12.15.23 shows focal MAs w/ late leakage - BCVA OS 20/150 - stable - exam shows  macroaneurysm with surrounding circinate exudates and edema, inferior macula OS -- improved - OCT shows [id number] disc elevation -- slightly increased, ERM with central thickening and ORA; drusen, SRHM, persistent IRF/IRHM inferior macula -- improved, new IRHM/edema of middle layers of superior paracentral macula at 5 wks - recommend IVE OS #8 today, 01.24.25 w/ f/u in 5 wks - RBA of procedure discussed, questions answered - IVE informed consent obtained and signed, 06.07.24 (OU) - see procedure note - f/u 5 weeks-- DFE/OCT/possible injection ** new BRAO superior macula/fovea OS**  - pt reports 3 day history of central visual field loss w/ extension to inferior paracentral VF  - OCT OS: Persistent disc elevation -- slightly increased, ERM with central thickening and ORA; drusen, SRHM, persistent IRF/IRHM inferior macula -- improved, new inner retinal hyperreflectivy and edema of superior paracentral macula and fovea -- new BRAO  - pt reports recent spikes in SBP >200  - discussed findings, prognosis  - monitor  1-3. BRVO  w/ CME and retinal macroaneurysm OD - s/p IVA OD #1 (06.07.24) #2 (07.08.24), #3 (08.09.24), #4 (09.06.24), #5 (10.10.24), #6 (11.15.24) - OCT shows OD: stable improvement IRF/IRHM (macroanuerysm) along ST arcades, Diffuse IRA inferior macula / hemisphere -- remote BRAO; stable improvement in disc edema at 5 wks - exam shows peripapillary hemorrhage, mild NVD nasal disc -- improving + new macroaneurysm w/ surrounding heme and edema along ST arcades -- improved  - FA 12.15.23  shows mild NVD nasal disc w/ leakage  - pt may benefit from PRP   - BCVA OD 20/30  - recommend holding injection today -- pt in agreement - f/u 5 weeks, DFE, OCT  4,5. Hypertensive retinopathy OU  - pt and son report recent changes in BP meds - discussed importance of tight BP control and likely relationship with BRVOs  6. Mixed Cataract OU - The symptoms of cataract, surgical options, and  treatments and risks were discussed with patient. - discussed diagnosis and progression - likely visually significant - under the expert management of Dr. Elmer Picker  - had appt with Dr. Steele Berg on November 10, 2023 who did not recommend cataract surgery at this time - clear from a retina standpoint to proceed with cataract surgery when pt and surgeon are ready  - would recommend timing cataract surgery ~1-2 wks after anti-VEGF injection  Ophthalmic Meds Ordered this visit:  Meds ordered this encounter  Medications   aflibercept (EYLEA) SOLN 2 mg     Return in about 5 weeks (around 02/03/2024) for f/u BRVO OU , DFE, OCT, Possible, IVE, OS.  There are no Patient Instructions on file for this visit.   This document serves as a record of services personally performed by Karie Chimera, MD, PhD. It was created on their behalf by Berlin Hun COT, an ophthalmic technician. The creation of this record is the provider's dictation and/or activities during the visit.    Electronically signed by: Berlin Hun COT 01.22.25 11:45 AM  This document serves as a record of services personally performed by Karie Chimera, MD, PhD. It was created on their behalf by Charlette Caffey, COT an ophthalmic technician. The creation of this record is the provider's dictation and/or activities during the visit.    Electronically signed by:  Charlette Caffey, COT  12/30/23 11:45 AM  Karie Chimera, M.D., Ph.D. Diseases & Surgery of the Retina and Vitreous [facility] 12/30/2023   I have reviewed the above documentation for accuracy and completeness, and I agree with the above. Karie Chimera, M.D., Ph.D. 12/30/23 11:46 AM   Abbreviations: M myopia (nearsighted); A astigmatism; H hyperopia (farsighted); P presbyopia; Mrx spectacle prescription;  CTL contact lenses; OD right eye; OS left eye; OU both eyes  XT exotropia; ET esotropia; PEK punctate epithelial keratitis;  PEE punctate epithelial erosions; DES dry eye syndrome; MGD meibomian gland dysfunction; ATs artificial tears; PFAT's preservative free artificial tears; NSC nuclear sclerotic cataract; PSC posterior subcapsular cataract; ERM epi-retinal membrane; PVD posterior vitreous detachment; RD retinal detachment; DM diabetes mellitus; DR diabetic retinopathy; NPDR non-proliferative diabetic retinopathy; PDR proliferative diabetic retinopathy; CSME clinically significant macular edema; DME diabetic macular edema; dbh dot blot hemorrhages; CWS cotton wool spot; POAG primary open angle glaucoma; C/D cup-to-disc ratio; HVF humphrey visual field; GVF goldmann visual field; OCT optical coherence tomography; IOP intraocular pressure; BRVO Branch retinal vein occlusion; CRVO central retinal vein occlusion; CRAO central retinal artery occlusion; BRAO branch retinal artery occlusion; RT retinal tear; SB scleral buckle; PPV pars plana vitrectomy; VH Vitreous hemorrhage; PRP panretinal laser photocoagulation; IVK intravitreal kenalog; VMT vitreomacular traction; MH Macular hole;  NVD neovascularization of the disc; NVE neovascularization elsewhere; AREDS age related eye disease study; ARMD age related macular degeneration; POAG primary open angle glaucoma; EBMD epithelial/anterior basement membrane dystrophy; ACIOL anterior chamber intraocular lens; IOL intraocular lens; PCIOL posterior chamber intraocular lens; Phaco/IOL phacoemulsification with intraocular lens placement; PRK photorefractive keratectomy; LASIK laser assisted in  situ keratomileusis; HTN hypertension; DM diabetes mellitus; COPD chronic obstructive pulmonary disease

## 2023-12-30 ENCOUNTER — Ambulatory Visit (INDEPENDENT_AMBULATORY_CARE_PROVIDER_SITE_OTHER): Payer: Medicare PPO | Admitting: Ophthalmology

## 2023-12-30 ENCOUNTER — Encounter (INDEPENDENT_AMBULATORY_CARE_PROVIDER_SITE_OTHER): Payer: Self-pay | Admitting: Ophthalmology

## 2023-12-30 DIAGNOSIS — I10 Essential (primary) hypertension: Secondary | ICD-10-CM

## 2023-12-30 DIAGNOSIS — H34833 Tributary (branch) retinal vein occlusion, bilateral, with macular edema: Secondary | ICD-10-CM

## 2023-12-30 DIAGNOSIS — H34233 Retinal artery branch occlusion, bilateral: Secondary | ICD-10-CM

## 2023-12-30 DIAGNOSIS — H35033 Hypertensive retinopathy, bilateral: Secondary | ICD-10-CM | POA: Diagnosis not present

## 2023-12-30 DIAGNOSIS — H3509 Other intraretinal microvascular abnormalities: Secondary | ICD-10-CM | POA: Diagnosis not present

## 2023-12-30 DIAGNOSIS — H25813 Combined forms of age-related cataract, bilateral: Secondary | ICD-10-CM

## 2023-12-30 MED ORDER — AFLIBERCEPT 2MG/0.05ML IZ SOLN FOR KALEIDOSCOPE
2.0000 mg | INTRAVITREAL | Status: AC | PRN
  Administered 2023-12-30: 2 mg via INTRAVITREAL

## 2024-02-02 NOTE — Progress Notes (Signed)
 [facility] - Clinic Note  02/03/2024     CHIEF COMPLAINT Patient presents for Retina Follow Up   HISTORY OF PRESENT ILLNESS: Shelley Mitchell is a 83 y.o. female who presents to the clinic today for:   HPI     Retina Follow Up   Patient presents with  CRVO/BRVO.  In both eyes.  Severity is moderate.  Duration of 5 weeks.  Since onset it is stable.  I, the attending physician,  performed the HPI with the patient and updated documentation appropriately.        Comments   5 week Retin eval. Patient states no vision changes      Last edited by Rennis Chris, MD on 02/03/2024 12:29 PM.     Referring physician: Mateo Flow, MD [address] [place],  [place] [address]  HISTORICAL INFORMATION:   Selected notes from the MEDICAL RECORD NUMBER Referred by Dr. Elmer Picker for concern of BDR / PDR LEE:  Ocular Hx- PMH-    CURRENT MEDICATIONS: Current Outpatient Medications (Ophthalmic Drugs)  Medication Sig   latanoprost (XALATAN) 0.005 % ophthalmic solution Place 1 drop into both eyes at bedtime.   No current facility-administered medications for this visit. (Ophthalmic Drugs)   Current Outpatient Medications (Other)  Medication Sig   acetaminophen (TYLENOL) 500 MG tablet Take 1,000 mg by mouth every 12 (twelve) hours.   albuterol (VENTOLIN HFA) 108 (90 Base) MCG/ACT inhaler Inhale 2 puffs into the lungs every 8 (eight) hours as needed for wheezing or shortness of breath.   Amino Acids-Protein Hydrolys (PRO-STAT AWC) LIQD Take 30 mLs by mouth 2 (two) times daily between meals.   amLODipine (NORVASC) 5 MG tablet Take 1 tablet (5 mg total) by mouth daily.   aspirin EC 81 MG tablet Take 81 mg by mouth daily. Swallow whole.   furosemide (LASIX) 20 MG tablet Take 20 mg by mouth daily.   levothyroxine (SYNTHROID) 25 MCG tablet Take 25 mcg by mouth daily before breakfast.   losartan (COZAAR) 50 MG tablet Take 1 tablet (50 mg total) by mouth daily.    lubiprostone (AMITIZA) 8 MCG capsule Take 1 capsule (8 mcg total) by mouth daily with breakfast.   medroxyPROGESTERone (PROVERA) 10 MG tablet TAKE 1 TABLET BY MOUTH DAILY 10 DAYS PER MONTH (Patient taking differently: Take 10 mg by mouth See admin instructions. Take 10 mg once daily on the 1st-10th day of each month)   Multiple Vitamins-Minerals (MULTIVITAMIN WITH MINERALS) tablet Take 1 tablet by mouth daily.   nystatin powder Apply 1 Application topically 3 (three) times daily.   polyethylene glycol powder (GLYCOLAX/MIRALAX) 17 GM/SCOOP powder Take 17 g by mouth daily.   pravastatin (PRAVACHOL) 40 MG tablet Take 40 mg by mouth at bedtime.   Semaglutide-Weight Management 2.4 MG/0.75ML SOAJ Inject 2.4 mg into the skin once a week.   sertraline (ZOLOFT) 100 MG tablet Take 100 mg by mouth daily.   No current facility-administered medications for this visit. (Other)   REVIEW OF SYSTEMS: ROS   Positive for: Musculoskeletal, Cardiovascular, Eyes, Respiratory Last edited by Lana Fish, COT on 02/03/2024  9:49 AM.     ALLERGIES Allergies  Allergen Reactions   Ace Inhibitors Itching   Nsaids Itching   PAST MEDICAL HISTORY Past Medical History:  Diagnosis Date   Endometrial polyp    Dr. Despina Hidden   History of endometrial hyperplasia    HTN (hypertension)    Hx of adenomatous colonic polyps 1999  Hyperlipidemia    Mental disorder    GAD   Obesity    Osteoarthritis    Tachycardia    subsequently with bradycardia (?secondary to meds)   Past Surgical History:  Procedure Laterality Date   BACK SURGERY     bilateral knee replacements  2002/2003   CHOLECYSTECTOMY     COLONOSCOPY  03/2002   Dr. Fleet Contras internal hemorrhoids   COLONOSCOPY  06/21/2011   polyp at IC valve (tubular adenoma)   COLONOSCOPY N/A 07/15/2016   one 8 mm polyp at hepatic flexure (tubular adenoma), on 12 mm polyp at hepatic flexure (tubular adenoma), 3 year surveillance if health permits   COLONOSCOPY N/A  10/10/2019   descending colon diverticulosis, two 4-5 mm polyps at splenic flexure (tubular adenomas). No surveillance due to age.    LUMBAR LAMINECTOMY/DECOMPRESSION MICRODISCECTOMY N/A 04/27/2022   Procedure: Thoracic Eleven-Twelve Decompression;  Surgeon: Coletta Memos, MD;  Location: [facility] OR;  Service: Neurosurgery;  Laterality: N/A;   POLYPECTOMY  07/15/2016   Procedure: POLYPECTOMY;  Surgeon: Corbin Ade, MD;  Location: [facility]ITE;  Service: Endoscopy;;  Splenic Flexure polyps x 2 removed via hot snare   POLYPECTOMY  10/10/2019   Procedure: POLYPECTOMY;  Surgeon: Corbin Ade, MD;  Location: [facility];  Service: Endoscopy;;   TUBAL LIGATION     FAMILY HISTORY Family History  Problem Relation Age of Onset   Diabetes Brother    Colon polyps Brother    Diabetes Sister    Cancer Father        Likely prostate   Stroke Mother 80   Colon cancer Neg Hx    SOCIAL HISTORY Social History   Tobacco Use   Smoking status: Never   Smokeless tobacco: Never  Vaping Use   Vaping status: Never Used  Substance Use Topics   Alcohol use: No    Alcohol/week: 0.0 standard drinks of alcohol   Drug use: No       OPHTHALMIC EXAM:  Base Eye Exam     Visual Acuity (Snellen - Linear)       Right Left   Dist cc 20/30-2 20/200   Dist ph cc 20/NI 20/NI         Tonometry (Tonopen, 9:51 AM)       Right Left   Pressure 11 10         Pupils       Dark Light Shape React APD   Right 3 2 Round Brisk None   Left 3 2 Round Brisk None         Visual Fields (Counting fingers)       Left Right    Full Full         Extraocular Movement       Right Left    Full, Ortho Full, Ortho         Neuro/Psych     Oriented x3: Yes   Mood/Affect: Normal         Dilation     Both eyes: 1.0% Mydriacyl @ 9:51 AM           Slit Lamp and Fundus Exam     Slit Lamp Exam       Right Left   Lids/Lashes Dermatochalasis - upper lid Dermatochalasis - upper lid    Conjunctiva/Sclera mild melanosis mild melanosis   Cornea arcus arcus   Anterior Chamber deep and clear deep and clear   Iris Round and dilated Round and dilated  Lens 2+ Nuclear sclerosis, 2+ Cortical cataract 2-3+ Nuclear sclerosis, 2-3+ Cortical cataract   Anterior Vitreous Vitreous syneresis, Posterior vitreous detachment, vitreous condensations Vitreous syneresis, Posterior vitreous detachment         Fundus Exam       Right Left   Disc nasal hyperemia -- improved, sharp rim, PPA, mild temporal pallor, no heme or disc edema Pink and Sharp, mild tilt, +PPA, no edema, focal hyperemia superiorly   C/D Ratio 0.3 0.3   Macula Flat, good foveal reflex, inferior macula atrophic, RPE mottling and clumping, subretinal heme and edema superior macula -- stably improved Blunted foveal reflex, persistent central and inferior IRH and exudates- slightly improved, central cystic changes -- improved, mild ERM, new patch of focal retinal whitening superior to fovea -- improved, flames hemes SN mac -- improved   Vessels attenuated, Tortuous, macroanuerysms with heme along ST arcades --improved -- fibrosing and turning white attenuated, Tortuous, retinal macroaneurysms along IT arcades--improved   Periphery Attached, peripapillary subretinal heme superior and nasal to disc -- stably resolved Attached, scattered IRH--improved/essentially resolved, cluster of exudates IT periphery           Refraction     Wearing Rx       Sphere Cylinder Axis Add   Right -1.25 +0.50 163 +2.00   Left -0.75 +0.50 153 +2.00           IMAGING AND PROCEDURES  Imaging and Procedures for 02/03/2024  OCT, Retina - OU - Both Eyes       Right Eye Quality was good. Central Foveal Thickness: 238. Progression has been stable. Findings include normal foveal contour, no IRF, no SRF, myopic contour, intraretinal hyper-reflective material, subretinal fluid, inner retinal atrophy (stable improvement IRF/IRHM (macroanuerysm)  along ST arcades, Diffuse IRA inferior macula / hemisphere -- remote BRAO; stable improvement in disc edema).   Left Eye Quality was good. Central Foveal Thickness: 397. Progression has improved. Findings include abnormal foveal contour, myopic contour, retinal drusen , subretinal hyper-reflective material, intraretinal hyper-reflective material, intraretinal fluid, outer retinal atrophy (Persistent disc elevation, ERM with central thickening and ORA; drusen, SRHM, persistent IRF/IRHM inferior macula -- improved, new inner retinal hyperreflectivy and edema of superior paracentral macula and fovea -- new BRAO -- improved).   Notes *Images captured and stored on drive  Diagnosis / Impression:  OD: stable improvement IRF/IRHM (macroanuerysm) along ST arcades, Diffuse IRA inferior macula / hemisphere -- remote BRAO; stable improvement in disc edema OS: Persistent disc elevation, ERM with central thickening and ORA; drusen, SRHM, persistent IRF/IRHM inferior macula -- improved, new inner retinal hyperreflectivy and edema of superior paracentral macula and fovea -- new BRAO -- improved  Clinical management:  See below  Abbreviations: NFP - Normal foveal profile. CME - cystoid macular edema. PED - pigment epithelial detachment. IRF - intraretinal fluid. SRF - subretinal fluid. EZ - ellipsoid zone. ERM - epiretinal membrane. ORA - outer retinal atrophy. ORT - outer retinal tubulation. SRHM - subretinal hyper-reflective material. IRHM - intraretinal hyper-reflective material      Intravitreal Injection, Pharmacologic Agent - OS - Left Eye       Time Out 02/03/2024. 11:22 AM. Confirmed correct patient, procedure, site, and patient consented.   Anesthesia Topical anesthesia was used. Anesthetic medications included Lidocaine 2%, Proparacaine 0.5%.   Procedure Preparation included 5% betadine to ocular surface, eyelid speculum. A (33g) needle was used.   Injection: 2 mg aflibercept 2 MG/0.05ML    Route: Intravitreal, Site: Left Eye   NDC: [id number],  Lot: [id number], Expiration date: 05/05/2025, Waste: 0 mL   Post-op Post injection exam found visual acuity of at least counting fingers. The patient tolerated the procedure well. There were no complications. The patient received written and verbal post procedure care education. Post injection medications were not given.            ASSESSMENT/PLAN:    ICD-10-CM   1. 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 Essential hypertension  I10     4. Hypertensive retinopathy of both eyes  H35.033     5. Combined forms of age-related cataract of both eyes  H25.813     6. 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 BRVO w/ CME and retinal macronauerysm OS - s/p IVA OS #1 (12.15.23) #2 (01.12.24), #3 (02.09.24), #4 (03.15.24), #5 (04.12.24), #6 (06.07.24) ============================= - s/p IVE OS #1 (05.10.24), #2 (07.08.24) #3(8.9.24), #4 (09.09.24), #5 (10.11.24), #6 (11.15.24), #7 (12.20.24) #8(01.24.25) - pt previously followed with Dr. Fayrene Fearing in [place], [place], but now lives in a facility in [place] and cannot be transported to [facility] - history of laser photoablation of retinal macroaneurysm OS per chart review - FA 12.15.23 shows focal MAs w/ late leakage - BCVA OS 20/200 from 20/150 - exam shows macroaneurysm with surrounding circinate exudates and edema, inferior macula OS -- improved - OCT shows OS: Persistent disc elevation, ERM with central thickening and ORA; drusen, +SRHM, persistent IRF/IRHM inferior macula -- improved, new IRHM/edema of middle layers of superior paracentral macula (BRAO) -- improved at 5 wks - recommend IVE OS #9 today, 02.28.25 w/ f/u in 5 wks - RBA of procedure discussed, questions  answered - IVE informed consent obtained and signed, 06.07.24 (OU) - see procedure note - f/u 5 weeks-- DFE/OCT/possible injection ** new BRAO superior macula/fovea OS on 01.24.25 visit**  - pt reported 3 day history of central visual field loss w/ extension to inferior paracentral VF  - OCT shows new inner retinal hyperreflectivy and edema of superior paracentral macula and fovea -- BRAO -- improved  - pt reports recent spikes in SBP >200  - discussed findings, prognosis  - monitor  1-3. BRVO  w/ CME and retinal macroaneurysm OD - s/p IVA OD #1 (06.07.24) #2 (07.08.24), #3 (08.09.24), #4 (09.06.24), #5 (10.10.24), #6 (11.15.24) - OCT shows OD: stable improvement IRF/IRHM (macroanuerysm) along ST arcades, Diffuse IRA inferior macula / hemisphere -- remote BRAO; stable improvement in disc edema at 3.5 mos - exam shows peripapillary hemorrhage, mild NVD nasal disc -- improved + new macroaneurysm w/ surrounding heme and edema along ST arcades -- improved  - FA 12.15.23 shows mild NVD nasal disc w/ leakage  - pt may benefit from PRP   - BCVA OD 20/30  - recommend holding injection today -- pt in agreement - f/u 5 weeks, DFE, OCT  4,5. Hypertensive retinopathy OU  - pt and son report recent changes in BP meds - discussed importance of tight BP control and likely relationship with BRVOs  6. Mixed Cataract OU - The symptoms of cataract, surgical options, and treatments and risks were discussed with patient. - discussed diagnosis and progression - likely visually significant - under the expert management of Dr. Elmer Picker  -  had appt with Dr. Steele Berg on November 10, 2023 who did not recommend cataract surgery at this time - clear from a retina standpoint to proceed with cataract surgery when pt and surgeon are ready  - would recommend timing cataract surgery ~1-2 wks after anti-VEGF injection  Ophthalmic Meds Ordered this visit:  Meds ordered this encounter  Medications   aflibercept  (EYLEA) SOLN 2 mg     Return for f/u 5-6 weeks, BRVO OS, DFE, OCT, Possible Injxn.  There are no Patient Instructions on file for this visit.   This document serves as a record of services personally performed by Karie Chimera, MD, PhD. It was created on their behalf by Berlin Hun COT, an ophthalmic technician. The creation of this record is the provider's dictation and/or activities during the visit.    Electronically signed by: Berlin Hun COT 02.27.25 12:05 AM  This document serves as a record of services personally performed by Karie Chimera, MD, PhD. It was created on their behalf by Glee Arvin. Manson Passey, OA an ophthalmic technician. The creation of this record is the provider's dictation and/or activities during the visit.    Electronically signed by: Glee Arvin. Manson Passey, OA 02/05/24 12:05 AM  Karie Chimera, M.D., Ph.D. Diseases & Surgery of the Retina and Vitreous [facility] 12/30/2023   I have reviewed the above documentation for accuracy and completeness, and I agree with the above. Karie Chimera, M.D., Ph.D. 02/05/24 12:12 AM   Abbreviations: M myopia (nearsighted); A astigmatism; H hyperopia (farsighted); P presbyopia; Mrx spectacle prescription;  CTL contact lenses; OD right eye; OS left eye; OU both eyes  XT exotropia; ET esotropia; PEK punctate epithelial keratitis; PEE punctate epithelial erosions; DES dry eye syndrome; MGD meibomian gland dysfunction; ATs artificial tears; PFAT's preservative free artificial tears; NSC nuclear sclerotic cataract; PSC posterior subcapsular cataract; ERM epi-retinal membrane; PVD posterior vitreous detachment; RD retinal detachment; DM diabetes mellitus; DR diabetic retinopathy; NPDR non-proliferative diabetic retinopathy; PDR proliferative diabetic retinopathy; CSME clinically significant macular edema; DME diabetic macular edema; dbh dot blot hemorrhages; CWS cotton wool spot; POAG primary open angle  glaucoma; C/D cup-to-disc ratio; HVF humphrey visual field; GVF goldmann visual field; OCT optical coherence tomography; IOP intraocular pressure; BRVO Branch retinal vein occlusion; CRVO central retinal vein occlusion; CRAO central retinal artery occlusion; BRAO branch retinal artery occlusion; RT retinal tear; SB scleral buckle; PPV pars plana vitrectomy; VH Vitreous hemorrhage; PRP panretinal laser photocoagulation; IVK intravitreal kenalog; VMT vitreomacular traction; MH Macular hole;  NVD neovascularization of the disc; NVE neovascularization elsewhere; AREDS age related eye disease study; ARMD age related macular degeneration; POAG primary open angle glaucoma; EBMD epithelial/anterior basement membrane dystrophy; ACIOL anterior chamber intraocular lens; IOL intraocular lens; PCIOL posterior chamber intraocular lens; Phaco/IOL phacoemulsification with intraocular lens placement; PRK photorefractive keratectomy; LASIK laser assisted in situ keratomileusis; HTN hypertension; DM diabetes mellitus; COPD chronic obstructive pulmonary disease

## 2024-02-03 ENCOUNTER — Ambulatory Visit (INDEPENDENT_AMBULATORY_CARE_PROVIDER_SITE_OTHER): Payer: Medicare PPO | Admitting: Ophthalmology

## 2024-02-03 ENCOUNTER — Encounter (INDEPENDENT_AMBULATORY_CARE_PROVIDER_SITE_OTHER): Payer: Self-pay | Admitting: Ophthalmology

## 2024-02-03 DIAGNOSIS — H25813 Combined forms of age-related cataract, bilateral: Secondary | ICD-10-CM

## 2024-02-03 DIAGNOSIS — H35033 Hypertensive retinopathy, bilateral: Secondary | ICD-10-CM

## 2024-02-03 DIAGNOSIS — I10 Essential (primary) hypertension: Secondary | ICD-10-CM

## 2024-02-03 DIAGNOSIS — H34233 Retinal artery branch occlusion, bilateral: Secondary | ICD-10-CM

## 2024-02-03 DIAGNOSIS — H3509 Other intraretinal microvascular abnormalities: Secondary | ICD-10-CM | POA: Diagnosis not present

## 2024-02-03 DIAGNOSIS — H34833 Tributary (branch) retinal vein occlusion, bilateral, with macular edema: Secondary | ICD-10-CM | POA: Diagnosis not present

## 2024-02-03 MED ORDER — AFLIBERCEPT 2MG/0.05ML IZ SOLN FOR KALEIDOSCOPE
2.0000 mg | INTRAVITREAL | Status: AC | PRN
  Administered 2024-02-03: 2 mg via INTRAVITREAL

## 2024-02-20 ENCOUNTER — Ambulatory Visit: Payer: Medicare PPO | Attending: Cardiology | Admitting: Cardiology

## 2024-02-20 ENCOUNTER — Encounter: Payer: Self-pay | Admitting: Cardiology

## 2024-02-20 VITALS — BP 193/74 | HR 45 | Ht 61.0 in | Wt 268.0 lb

## 2024-02-20 DIAGNOSIS — R001 Bradycardia, unspecified: Secondary | ICD-10-CM | POA: Diagnosis not present

## 2024-02-20 DIAGNOSIS — I10 Essential (primary) hypertension: Secondary | ICD-10-CM

## 2024-02-20 MED ORDER — OMEPRAZOLE 20 MG PO CPDR: 20.0000 mg | DELAYED_RELEASE_CAPSULE | Freq: Every day | ORAL | 11 refills | Status: AC

## 2024-02-20 MED ORDER — LOSARTAN POTASSIUM 100 MG PO TABS: 100.0000 mg | ORAL_TABLET | Freq: Every day | ORAL | 3 refills | Status: AC

## 2024-02-20 NOTE — Progress Notes (Signed)
 Cardiology Office Note:    Date:  02/20/2024   ID:  Shelley Mitchell, DOB [birth year], MRN [id number]  PCP:  Katherina Right, MD   [facility]  Cardiologist:  Debbe Odea, MD  Advanced Practice Provider:  No care team member to display Electrophysiologist:  None       Referring MD: Katherina Right, MD   No chief complaint on file.   History of Present Illness:    Shelley Mitchell is a 83 y.o. female with a hx of hypertension, hyperlipidemia, bradycardia, OSA, presenting for follow-up.  Blood pressures have been elevated of late, systolics ranges anywhere from 140s to 180s.  She lives in a skilled nursing facility, per son at bedside, BP earlier today was in the 180s.  Patient endorses heartburn, not sure if the cyst secondary to a protein shake given at skilled nursing facility.   Prior notes Cardiac monitor 3/24 showed average heart rate heart rates in the 50s, no high degree AV block noted. Echo 12/2022 EF 60 to 65%, aortic valve sclerosis, impaired relaxation.   Past Medical History:  Diagnosis Date   Endometrial polyp    Dr. Despina Hidden   History of endometrial hyperplasia    HTN (hypertension)    Hx of adenomatous colonic polyps 1999   Hyperlipidemia    Mental disorder    GAD   Obesity    Osteoarthritis    Tachycardia    subsequently with bradycardia (?secondary to meds)    Past Surgical History:  Procedure Laterality Date   BACK SURGERY     bilateral knee replacements  2002/2003   CHOLECYSTECTOMY     COLONOSCOPY  03/2002   Dr. Fleet Contras internal hemorrhoids   COLONOSCOPY  06/21/2011   polyp at IC valve (tubular adenoma)   COLONOSCOPY N/A 07/15/2016   one 8 mm polyp at hepatic flexure (tubular adenoma), on 12 mm polyp at hepatic flexure (tubular adenoma), 3 year surveillance if health permits   COLONOSCOPY N/A 10/10/2019   descending colon diverticulosis, two 4-5 mm polyps at splenic flexure (tubular adenomas). No surveillance due to age.     LUMBAR LAMINECTOMY/DECOMPRESSION MICRODISCECTOMY N/A 04/27/2022   Procedure: Thoracic Eleven-Twelve Decompression;  Surgeon: Coletta Memos, MD;  Location: [facility] OR;  Service: Neurosurgery;  Laterality: N/A;   POLYPECTOMY  07/15/2016   Procedure: POLYPECTOMY;  Surgeon: Corbin Ade, MD;  Location: [facility];  Service: Endoscopy;;  Splenic Flexure polyps x 2 removed via hot snare   POLYPECTOMY  10/10/2019   Procedure: POLYPECTOMY;  Surgeon: Corbin Ade, MD;  Location: [facility];  Service: Endoscopy;;   TUBAL LIGATION      Current Medications: Current Meds  Medication Sig   acetaminophen (TYLENOL) 500 MG tablet Take 1,000 mg by mouth every 12 (twelve) hours.   albuterol (VENTOLIN HFA) 108 (90 Base) MCG/ACT inhaler Inhale 2 puffs into the lungs every 8 (eight) hours as needed for wheezing or shortness of breath.   Amino Acids-Protein Hydrolys (PRO-STAT AWC) LIQD Take 30 mLs by mouth 2 (two) times daily between meals.   amLODipine (NORVASC) 5 MG tablet Take 1 tablet (5 mg total) by mouth daily.   aspirin EC 81 MG tablet Take 81 mg by mouth daily. Swallow whole.   furosemide (LASIX) 20 MG tablet Take 20 mg by mouth daily.   latanoprost (XALATAN) 0.005 % ophthalmic solution Place 1 drop into both eyes at bedtime.   levothyroxine (SYNTHROID) 25 MCG tablet Take 25 mcg by mouth daily before  breakfast.   lubiprostone (AMITIZA) 8 MCG capsule Take 1 capsule (8 mcg total) by mouth daily with breakfast.   medroxyPROGESTERone (PROVERA) 10 MG tablet TAKE 1 TABLET BY MOUTH DAILY 10 DAYS PER MONTH (Patient taking differently: Take 10 mg by mouth See admin instructions. Take 10 mg once daily on the 1st-10th day of each month)   Multiple Vitamins-Minerals (MULTIVITAMIN WITH MINERALS) tablet Take 1 tablet by mouth daily.   nystatin powder Apply 1 Application topically 3 (three) times daily.   omeprazole (PRILOSEC) 20 MG capsule Take 1 capsule (20 mg total) by mouth daily.   polyethylene glycol powder  (GLYCOLAX/MIRALAX) 17 GM/SCOOP powder Take 17 g by mouth daily.   pravastatin (PRAVACHOL) 40 MG tablet Take 40 mg by mouth at bedtime.   Semaglutide-Weight Management 2.4 MG/0.75ML SOAJ Inject 2.4 mg into the skin once a week.   sertraline (ZOLOFT) 100 MG tablet Take 100 mg by mouth daily.   [DISCONTINUED] losartan (COZAAR) 50 MG tablet Take 1 tablet (50 mg total) by mouth daily.     Allergies:   Ace inhibitors and Nsaids   Social History   Socioeconomic History   Marital status: Married    Spouse name: Not on file   Number of children: 4   Years of education: Not on file   Highest education level: Not on file  Occupational History    Employer: RETIRED  Tobacco Use   Smoking status: Never   Smokeless tobacco: Never  Vaping Use   Vaping status: Never Used  Substance and Sexual Activity   Alcohol use: No    Alcohol/week: 0.0 standard drinks of alcohol   Drug use: No   Sexual activity: Not Currently    Birth control/protection: Post-menopausal  Other Topics Concern   Not on file  Social History Narrative   Not on file   Social Drivers of Health   Financial Resource Strain: Low Risk  (02/07/2024)   Received from [facility]   Overall Financial Resource Strain (CARDIA)    Difficulty of Paying Living Expenses: Not hard at all  Food Insecurity: No Food Insecurity (02/07/2024)   Received from [facility]   Hunger Vital Sign    Worried About Running Out of Food in the Last Year: Never true    Ran Out of Food in the Last Year: Never true  Transportation Needs: No Transportation Needs (02/07/2024)   Received from [facility] - Transportation    In the past 12 months, has lack of transportation kept you from medical appointments or from getting medications?: No    Lack of Transportation (Non-Medical): No  Physical Activity: Not on file  Stress: Not on file  Social Connections: Not on file     Family History: The  patient's family history includes Cancer in her father; Colon polyps in her brother; Diabetes in her brother and sister; Stroke (age of onset: 58) in her mother. There is no history of Colon cancer.  ROS:   Please see the history of present illness.     All other systems reviewed and are negative.  EKGs/Labs/Other Studies Reviewed:    The following studies were reviewed today:  EKG Interpretation Date/Time:  Monday February 20 2024 11:41:20 EDT Ventricular Rate:  45 PR Interval:  208 QRS Duration:  98 QT Interval:  496 QTC Calculation: 429 R Axis:   -25  Text Interpretation: Sinus bradycardia with marked sinus arrhythmia Moderate voltage criteria for LVH, may be  normal variant ( R in aVL , Cornell product ) Cannot rule out Anterior infarct , age undetermined Confirmed by Debbe Odea ([id number]) on 02/20/2024 11:57:59 AM    Recent Labs: No results found for requested labs within last 365 days.  Recent Lipid Panel    Component Value Date/Time   CHOL 160 04/24/2022 0341   TRIG 83 04/24/2022 0341   HDL 67 04/24/2022 0341   CHOLHDL 2.4 04/24/2022 0341   VLDL 17 04/24/2022 0341   LDLCALC 76 04/24/2022 0341     Risk Assessment/Calculations:      Physical Exam:    VS:  BP (!) 193/74 (BP Location: Left Arm, Patient Position: Sitting, Cuff Size: Normal)   Pulse (!) 45   Ht 5\' 1"  (1.549 m)   Wt 268 lb (121.6 kg)   SpO2 96%   BMI 50.64 kg/m     Wt Readings from Last 3 Encounters:  02/20/24 268 lb (121.6 kg)  08/18/23 257 lb 6.4 oz (116.8 kg)  03/14/23 256 lb (116.1 kg)     GEN:  Well nourished, well developed in no acute distress HEENT: Normal NECK: No JVD; No carotid bruits CARDIAC: Bradycardic, regular, 2/6 systolic murmur RESPIRATORY:  Clear to auscultation without rales, wheezing or rhonchi  ABDOMEN: Soft, non-tender, non-distended MUSCULOSKELETAL:  1+ edema; No deformity  SKIN: Warm and dry NEUROLOGIC:  Alert and oriented x 3 PSYCHIATRIC:  Normal affect    ASSESSMENT:    1. Primary hypertension   2. Morbid obesity (HCC)   3. Sinus bradycardia    PLAN:    In order of problems listed above:  Hypertension, BP elevated.  Increase losartan to 100 mg daily, continue Norvasc 5 mg daily. Morbid obesity, low-calorie diet, on Wegovy. Asymptomatic sinus bradycardia, previous cardiac monitor with no high degree AV block.  No indication for pacemaker.  Patient is wheelchair-bound.  Follow-up in 6 months.   Medication Adjustments/Labs and Tests Ordered: Current medicines are reviewed at length with the patient today.  Concerns regarding medicines are outlined above.  Orders Placed This Encounter  Procedures   EKG 12-Lead   Meds ordered this encounter  Medications   losartan (COZAAR) 100 MG tablet    Sig: Take 1 tablet (100 mg total) by mouth daily.    Dispense:  90 tablet    Refill:  3    Pt would prefer brand name Cozaar   omeprazole (PRILOSEC) 20 MG capsule    Sig: Take 1 capsule (20 mg total) by mouth daily.    Dispense:  30 capsule    Refill:  11    Patient Instructions  Medication Instructions:  Your physician recommends the following medication changes.  START TAKING: Omeprazole 20 mg by mouth daily   INCREASE: Cozaar to 100 mg by mouth daily   *If you need a refill on your cardiac medications before your next appointment, please call your pharmacy*   Lab Work: No labs ordered today    Testing/Procedures: No test ordered today    Follow-Up: At [facility], you and your health needs are our priority.  As part of our continuing mission to provide you with exceptional heart care, we have created designated Provider Care Teams.  These Care Teams include your primary Cardiologist (physician) and Advanced Practice Providers (APPs -  Physician Assistants and Nurse Practitioners) who all work together to provide you with the care you need, when you need it.  We recommend signing up for the patient portal called  "MyChart".  Sign  up information is provided on this After Visit Summary.  MyChart is used to connect with patients for Virtual Visits (Telemedicine).  Patients are able to view lab/test results, encounter notes, upcoming appointments, etc.  Non-urgent messages can be sent to your provider as well.   To learn more about what you can do with MyChart, go to [email or web address].    Your next appointment:   3 month(s)  Provider:   You may see Debbe Odea, MD or one of the following Advanced Practice Providers on your designated Care Team:   Nicolasa Ducking, NP Eula Listen, PA-C Cadence Fransico Michael, PA-C Charlsie Quest, NP Carlos Levering, NP      Signed, Debbe Odea, MD  02/20/2024 12:53 PM    [facility]

## 2024-02-20 NOTE — Patient Instructions (Addendum)
 Medication Instructions:  Your physician recommends the following medication changes.  START TAKING: Omeprazole 20 mg by mouth daily   INCREASE: Cozaar to 100 mg by mouth daily   *If you need a refill on your cardiac medications before your next appointment, please call your pharmacy*   Lab Work: No labs ordered today    Testing/Procedures: No test ordered today    Follow-Up: At [facility], you and your health needs are our priority.  As part of our continuing mission to provide you with exceptional heart care, we have created designated Provider Care Teams.  These Care Teams include your primary Cardiologist (physician) and Advanced Practice Providers (APPs -  Physician Assistants and Nurse Practitioners) who all work together to provide you with the care you need, when you need it.  We recommend signing up for the patient portal called "MyChart".  Sign up information is provided on this After Visit Summary.  MyChart is used to connect with patients for Virtual Visits (Telemedicine).  Patients are able to view lab/test results, encounter notes, upcoming appointments, etc.  Non-urgent messages can be sent to your provider as well.   To learn more about what you can do with MyChart, go to [email or web address].    Your next appointment:   3 month(s)  Provider:   You may see Debbe Odea, MD or one of the following Advanced Practice Providers on your designated Care Team:   Nicolasa Ducking, NP Eula Listen, PA-C Cadence Fransico Michael, PA-C Charlsie Quest, NP Carlos Levering, NP

## 2024-03-06 NOTE — Progress Notes (Signed)
 [facility] - Clinic Note  03/07/2024     CHIEF COMPLAINT Patient presents for Retina Follow Up   HISTORY OF PRESENT ILLNESS: Shelley Mitchell is a 83 y.o. female who presents to the clinic today for:   HPI     Retina Follow Up   Patient presents with  CRVO/BRVO.  In both eyes.  Severity is moderate.  Duration of 5 weeks.  Since onset it is stable.  I, the attending physician,  performed the HPI with the patient and updated documentation appropriately.        Comments   5 week Retin eval. Patient states she has noticed some improvement in vision. Pt is taking Travaprost OU at bedtime and uses PF Ats daily. Pt states she has noticed a new large floater in her right eye. Pt is experiencing sharp pain in her left eye when looking temporally.      Last edited by Rennis Chris, MD on 03/07/2024 12:26 PM.    Pt saw Dr. Steele Berg yesterday and was told the swelling in her optic nerve has gone down, was told she doesn't need cataract sx right now, one of her BP meds was increased  Referring physician: Mateo Flow, MD [address] [place],  [place] [address]  HISTORICAL INFORMATION:   Selected notes from the MEDICAL RECORD NUMBER Referred by Dr. Elmer Picker for concern of BDR / PDR LEE:  Ocular Hx- PMH-    CURRENT MEDICATIONS: Current Outpatient Medications (Ophthalmic Drugs)  Medication Sig   latanoprost (XALATAN) 0.005 % ophthalmic solution Place 1 drop into both eyes at bedtime.   No current facility-administered medications for this visit. (Ophthalmic Drugs)   Current Outpatient Medications (Other)  Medication Sig   acetaminophen (TYLENOL) 500 MG tablet Take 1,000 mg by mouth every 12 (twelve) hours.   albuterol (VENTOLIN HFA) 108 (90 Base) MCG/ACT inhaler Inhale 2 puffs into the lungs every 8 (eight) hours as needed for wheezing or shortness of breath.   Amino Acids-Protein Hydrolys (PRO-STAT AWC) LIQD Take 30 mLs by mouth 2 (two) times daily between  meals.   amLODipine (NORVASC) 5 MG tablet Take 1 tablet (5 mg total) by mouth daily.   aspirin EC 81 MG tablet Take 81 mg by mouth daily. Swallow whole.   furosemide (LASIX) 20 MG tablet Take 20 mg by mouth daily.   levothyroxine (SYNTHROID) 25 MCG tablet Take 25 mcg by mouth daily before breakfast.   losartan (COZAAR) 100 MG tablet Take 1 tablet (100 mg total) by mouth daily.   lubiprostone (AMITIZA) 8 MCG capsule Take 1 capsule (8 mcg total) by mouth daily with breakfast.   medroxyPROGESTERone (PROVERA) 10 MG tablet TAKE 1 TABLET BY MOUTH DAILY 10 DAYS PER MONTH (Patient taking differently: Take 10 mg by mouth See admin instructions. Take 10 mg once daily on the 1st-10th day of each month)   Multiple Vitamins-Minerals (MULTIVITAMIN WITH MINERALS) tablet Take 1 tablet by mouth daily.   nystatin powder Apply 1 Application topically 3 (three) times daily.   omeprazole (PRILOSEC) 20 MG capsule Take 1 capsule (20 mg total) by mouth daily.   polyethylene glycol powder (GLYCOLAX/MIRALAX) 17 GM/SCOOP powder Take 17 g by mouth daily.   pravastatin (PRAVACHOL) 40 MG tablet Take 40 mg by mouth at bedtime.   Semaglutide-Weight Management 2.4 MG/0.75ML SOAJ Inject 2.4 mg into the skin once a week.   sertraline (ZOLOFT) 100 MG tablet Take 100 mg by mouth daily.   No current facility-administered medications  for this visit. (Other)   REVIEW OF SYSTEMS: ROS   Positive for: Musculoskeletal, Cardiovascular, Eyes, Respiratory Last edited by Elicia Lamp, COT on 03/07/2024  9:31 AM.      ALLERGIES Allergies  Allergen Reactions   Ace Inhibitors Itching   Nsaids Itching   PAST MEDICAL HISTORY Past Medical History:  Diagnosis Date   Endometrial polyp    Dr. Despina Hidden   History of endometrial hyperplasia    HTN (hypertension)    Hx of adenomatous colonic polyps 1999   Hyperlipidemia    Mental disorder    GAD   Obesity    Osteoarthritis    Tachycardia    subsequently with bradycardia (?secondary  to meds)   Past Surgical History:  Procedure Laterality Date   BACK SURGERY     bilateral knee replacements  2002/2003   CHOLECYSTECTOMY     COLONOSCOPY  03/2002   Dr. Fleet Contras internal hemorrhoids   COLONOSCOPY  06/21/2011   polyp at IC valve (tubular adenoma)   COLONOSCOPY N/A 07/15/2016   one 8 mm polyp at hepatic flexure (tubular adenoma), on 12 mm polyp at hepatic flexure (tubular adenoma), 3 year surveillance if health permits   COLONOSCOPY N/A 10/10/2019   descending colon diverticulosis, two 4-5 mm polyps at splenic flexure (tubular adenomas). No surveillance due to age.    LUMBAR LAMINECTOMY/DECOMPRESSION MICRODISCECTOMY N/A 04/27/2022   Procedure: Thoracic Eleven-Twelve Decompression;  Surgeon: Coletta Memos, MD;  Location: [facility];  Service: Neurosurgery;  Laterality: N/A;   POLYPECTOMY  07/15/2016   Procedure: POLYPECTOMY;  Surgeon: Corbin Ade, MD;  Location: [facility];  Service: Endoscopy;;  Splenic Flexure polyps x 2 removed via hot snare   POLYPECTOMY  10/10/2019   Procedure: POLYPECTOMY;  Surgeon: Corbin Ade, MD;  Location: [facility];  Service: Endoscopy;;   TUBAL LIGATION     FAMILY HISTORY Family History  Problem Relation Age of Onset   Diabetes Brother    Colon polyps Brother    Diabetes Sister    Cancer Father        Likely prostate   Stroke Mother 73   Colon cancer Neg Hx    SOCIAL HISTORY Social History   Tobacco Use   Smoking status: Never   Smokeless tobacco: Never  Vaping Use   Vaping status: Never Used  Substance Use Topics   Alcohol use: No    Alcohol/week: 0.0 standard drinks of alcohol   Drug use: No       OPHTHALMIC EXAM:  Base Eye Exam     Visual Acuity (Snellen - Linear)       Right Left   Dist cc 20/70 -2 20/200 -2   Dist ph cc 20/30 -2 20/150 -1         Tonometry (Tonopen, 9:39 AM)       Right Left   Pressure 13 8         Pupils       Dark Light Shape React APD   Right 3 2 Round Slow None   Left  3 2 Round Slow None         Visual Fields       Left Right    Full Full         Extraocular Movement       Right Left    Full, Ortho Full, Ortho         Neuro/Psych     Oriented x3: Yes   Mood/Affect: Normal  Dilation     Both eyes: 1.0% Mydriacyl, 2.5% Phenylephrine @ 9:40 AM           Slit Lamp and Fundus Exam     Slit Lamp Exam       Right Left   Lids/Lashes Dermatochalasis - upper lid Dermatochalasis - upper lid   Conjunctiva/Sclera mild melanosis mild melanosis   Cornea arcus arcus   Anterior Chamber deep and clear deep and clear   Iris Round and dilated Round and dilated   Lens 2+ Nuclear sclerosis, 2+ Cortical cataract 2-3+ Nuclear sclerosis, 2-3+ Cortical cataract   Anterior Vitreous Vitreous syneresis, Posterior vitreous detachment, vitreous condensations Vitreous syneresis, Posterior vitreous detachment         Fundus Exam       Right Left   Disc nasal hyperemia -- improved, sharp rim, PPA, mild temporal pallor, no heme or disc edema Pink and Sharp, mild tilt, +PPA, no edema, focal hyperemia superiorly   C/D Ratio 0.3 0.3   Macula Flat, good foveal reflex, inferior macula atrophic, RPE mottling and clumping, subretinal heme and edema superior macula -- stably improved Blunted foveal reflex, persistent central and inferior IRH and exudates -- slightly improved, central cystic changes -- stably improved, mild ERM, patch of focal retinal whitening superior to fovea -- improved, flames hemes SN mac -- improved   Vessels attenuated, Tortuous, macroanuerysms with heme along ST arcades --improved -- fibrosing and turning white attenuated, Tortuous, retinal macroaneurysms along IT arcades--improved   Periphery Attached, peripapillary subretinal heme superior and nasal to disc -- stably resolved Attached, scattered IRH--improved/essentially resolved, cluster of exudates IT periphery           Refraction     Wearing Rx       Sphere  Cylinder Axis Add   Right -1.25 +0.50 163 +2.00   Left -0.75 +0.50 153 +2.00           IMAGING AND PROCEDURES  Imaging and Procedures for 03/07/2024  OCT, Retina - OU - Both Eyes       Right Eye Quality was good. Central Foveal Thickness: 231. Progression has been stable. Findings include normal foveal contour, no IRF, no SRF, myopic contour, intraretinal hyper-reflective material, subretinal fluid, inner retinal atrophy (stable improvement IRF/IRHM (macroanuerysm) along ST arcades, Diffuse IRA inferior macula / hemisphere -- remote BRAO; stable improvement in disc edema).   Left Eye Quality was good. Central Foveal Thickness: 410. Progression has improved. Findings include abnormal foveal contour, myopic contour, retinal drusen , subretinal hyper-reflective material, intraretinal hyper-reflective material, intraretinal fluid, outer retinal atrophy (Persistent disc elevation, ERM with central thickening and ORA; drusen, SRHM, persistent IRF/IRHM inferior macula -- improved, new inner retinal hyperreflectivy and edema of superior paracentral macula and fovea -- new BRAO -- improved).   Notes *Images captured and stored on drive  Diagnosis / Impression:  OD: stable improvement IRF/IRHM (macroanuerysm) along ST arcades, Diffuse IRA inferior macula / hemisphere -- remote BRAO; stable improvement in disc edema OS: Persistent disc elevation, ERM with central thickening and ORA; drusen, SRHM, persistent IRF/IRHM inferior macula -- improved, new inner retinal hyperreflectivy and edema of superior paracentral macula and fovea -- BRAO -- improved  Clinical management:  See below  Abbreviations: NFP - Normal foveal profile. CME - cystoid macular edema. PED - pigment epithelial detachment. IRF - intraretinal fluid. SRF - subretinal fluid. EZ - ellipsoid zone. ERM - epiretinal membrane. ORA - outer retinal atrophy. ORT - outer retinal tubulation. SRHM - subretinal hyper-reflective material. IRHM -  intraretinal hyper-reflective material      Intravitreal Injection, Pharmacologic Agent - OS - Left Eye       Time Out 03/07/2024. 10:34 AM. Confirmed correct patient, procedure, site, and patient consented.   Anesthesia Topical anesthesia was used. Anesthetic medications included Lidocaine 2%, Proparacaine 0.5%.   Procedure Preparation included 5% betadine to ocular surface, eyelid speculum. A (33g) needle was used.   Injection: 2 mg aflibercept 2 MG/0.05ML   Route: Intravitreal, Site: Left Eye   NDC: L6038910, Lot: [id number], Expiration date: 05/04/2025, Waste: 0 mL   Post-op Post injection exam found visual acuity of at least counting fingers. The patient tolerated the procedure well. There were no complications. The patient received written and verbal post procedure care education. Post injection medications were not given.            ASSESSMENT/PLAN:    ICD-10-CM   1. 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 Essential hypertension  I10     4. Hypertensive retinopathy of both eyes  H35.033     5. Combined forms of age-related cataract of both eyes  H25.813     6. 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 BRVO w/ CME and retinal macronauerysm OS - s/p IVA OS #1 (12.15.23) #2 (01.12.24), #3 (02.09.24), #4 (03.15.24), #5 (04.12.24), #6 (06.07.24) ============================= - s/p IVE OS #1 (05.10.24), #2 (07.08.24) #3(8.9.24), #4 (09.09.24), #5 (10.11.24), #6 (11.15.24), #7 (12.20.24) #8(01.24.25), #9 (02.28.25) - pt previously followed with Dr. Fayrene Fearing in [place], [place], but now lives in a facility in [place] and cannot be transported to [place] - history of laser photoablation of retinal macroaneurysm OS per chart review - FA 12.15.23  shows focal MAs w/ late leakage - BCVA OS 20/200 from 20/150 - exam shows macroaneurysm with surrounding circinate exudates and edema, inferior macula OS -- improved - OCT shows OS: Persistent disc elevation, ERM with central thickening and ORA; drusen, +SRHM, persistent IRF/IRHM inferior macula -- improved, new IRHM/edema of middle layers of superior paracentral macula (BRAO) -- improved at 5 wks - recommend IVE OS #10 today, 04.02.25 w/ f/u in 5-6 wks - RBA of procedure discussed, questions answered - IVE informed consent obtained and signed, 06.07.24 (OU) - see procedure note - f/u 5-6 weeks-- DFE/OCT/possible injection ** new BRAO superior macula/fovea OS on 01.24.25 visit**  - pt reported 3 day history of central visual field loss w/ extension to inferior paracentral VF  - OCT shows new inner retinal hyperreflectivy and edema of superior paracentral macula and fovea -- BRAO -- improved  - pt reports recent spikes in SBP >200  - discussed findings, prognosis  - monitor  1-3. BRVO  w/ CME and retinal macroaneurysm OD - s/p IVA OD #1 (06.07.24) #2 (07.08.24), #3 (08.09.24), #4 (09.06.24), #5 (10.10.24), #6 (11.15.24) - OCT shows OD: stable improvement IRF/IRHM (macroanuerysm) along ST arcades, Diffuse IRA inferior macula / hemisphere -- remote BRAO; stable improvement in disc edema at 3.5 mos - exam shows peripapillary hemorrhage, mild NVD nasal disc -- improved; macroaneurysm w/ surrounding heme and edema along ST arcades -- improved  - FA 12.15.23 shows mild NVD nasal disc w/ leakage  - pt may benefit from PRP   -  BCVA OD 20/30 -- stable  - recommend holding injection today -- pt in agreement - f/u 5 weeks, DFE, OCT  4,5. Hypertensive retinopathy OU  - pt and son report recent changes in BP meds - discussed importance of tight BP control and likely relationship with BRVOs  6. Mixed Cataract OU - The symptoms of cataract, surgical options, and treatments and risks were discussed  with patient. - discussed diagnosis and progression - likely visually significant - under the expert management of Dr. Elmer Picker  - had appt with Dr. Steele Berg on November 10, 2023 who did not recommend cataract surgery at this time - clear from a retina standpoint to proceed with cataract surgery when pt and surgeon are ready  - would recommend timing cataract surgery ~1-2 wks after anti-VEGF injection  Ophthalmic Meds Ordered this visit:  Meds ordered this encounter  Medications   aflibercept (EYLEA) SOLN 2 mg     Return for f/u 5-6 weeks, BRVO OU, DFE, OCT, Possible Injxn.  There are no Patient Instructions on file for this visit.   This document serves as a record of services personally performed by Karie Chimera, MD, PhD. It was created on their behalf by Charlette Caffey, COT an ophthalmic technician. The creation of this record is the provider's dictation and/or activities during the visit.    Electronically signed by:  Charlette Caffey, COT  03/09/24 11:59 PM  This document serves as a record of services personally performed by Karie Chimera, MD, PhD. It was created on their behalf by Glee Arvin. Manson Passey, OA an ophthalmic technician. The creation of this record is the provider's dictation and/or activities during the visit.    Electronically signed by: Glee Arvin. Manson Passey, OA 03/09/24 11:59 PM  Karie Chimera, M.D., Ph.D. Diseases & Surgery of the Retina and Vitreous [facility]  I have reviewed the above documentation for accuracy and completeness, and I agree with the above. Karie Chimera, M.D., Ph.D. 03/10/24 12:03 AM   Abbreviations: M myopia (nearsighted); A astigmatism; H hyperopia (farsighted); P presbyopia; Mrx spectacle prescription;  CTL contact lenses; OD right eye; OS left eye; OU both eyes  XT exotropia; ET esotropia; PEK punctate epithelial keratitis; PEE punctate epithelial erosions; DES dry eye syndrome; MGD meibomian gland  dysfunction; ATs artificial tears; PFAT's preservative free artificial tears; NSC nuclear sclerotic cataract; PSC posterior subcapsular cataract; ERM epi-retinal membrane; PVD posterior vitreous detachment; RD retinal detachment; DM diabetes mellitus; DR diabetic retinopathy; NPDR non-proliferative diabetic retinopathy; PDR proliferative diabetic retinopathy; CSME clinically significant macular edema; DME diabetic macular edema; dbh dot blot hemorrhages; CWS cotton wool spot; POAG primary open angle glaucoma; C/D cup-to-disc ratio; HVF humphrey visual field; GVF goldmann visual field; OCT optical coherence tomography; IOP intraocular pressure; BRVO Branch retinal vein occlusion; CRVO central retinal vein occlusion; CRAO central retinal artery occlusion; BRAO branch retinal artery occlusion; RT retinal tear; SB scleral buckle; PPV pars plana vitrectomy; VH Vitreous hemorrhage; PRP panretinal laser photocoagulation; IVK intravitreal kenalog; VMT vitreomacular traction; MH Macular hole;  NVD neovascularization of the disc; NVE neovascularization elsewhere; AREDS age related eye disease study; ARMD age related macular degeneration; POAG primary open angle glaucoma; EBMD epithelial/anterior basement membrane dystrophy; ACIOL anterior chamber intraocular lens; IOL intraocular lens; PCIOL posterior chamber intraocular lens; Phaco/IOL phacoemulsification with intraocular lens placement; PRK photorefractive keratectomy; LASIK laser assisted in situ keratomileusis; HTN hypertension; DM diabetes mellitus; COPD chronic obstructive pulmonary disease

## 2024-03-07 ENCOUNTER — Encounter (INDEPENDENT_AMBULATORY_CARE_PROVIDER_SITE_OTHER): Payer: Self-pay | Admitting: Ophthalmology

## 2024-03-07 ENCOUNTER — Ambulatory Visit (INDEPENDENT_AMBULATORY_CARE_PROVIDER_SITE_OTHER): Admitting: Ophthalmology

## 2024-03-07 DIAGNOSIS — H34833 Tributary (branch) retinal vein occlusion, bilateral, with macular edema: Secondary | ICD-10-CM | POA: Diagnosis not present

## 2024-03-07 DIAGNOSIS — H3509 Other intraretinal microvascular abnormalities: Secondary | ICD-10-CM

## 2024-03-07 DIAGNOSIS — I10 Essential (primary) hypertension: Secondary | ICD-10-CM | POA: Diagnosis not present

## 2024-03-07 DIAGNOSIS — H35033 Hypertensive retinopathy, bilateral: Secondary | ICD-10-CM | POA: Diagnosis not present

## 2024-03-07 DIAGNOSIS — H34233 Retinal artery branch occlusion, bilateral: Secondary | ICD-10-CM

## 2024-03-07 DIAGNOSIS — H25813 Combined forms of age-related cataract, bilateral: Secondary | ICD-10-CM

## 2024-03-07 MED ORDER — AFLIBERCEPT 2MG/0.05ML IZ SOLN FOR KALEIDOSCOPE
2.0000 mg | INTRAVITREAL | Status: AC | PRN
  Administered 2024-03-07: 2 mg via INTRAVITREAL

## 2024-03-09 ENCOUNTER — Encounter (INDEPENDENT_AMBULATORY_CARE_PROVIDER_SITE_OTHER): Payer: Medicare PPO | Admitting: Ophthalmology

## 2024-04-12 NOTE — Progress Notes (Signed)
 [facility] - Clinic Note  04/24/2024     CHIEF COMPLAINT Patient presents for Retina Follow Up   HISTORY OF PRESENT ILLNESS: Shelley Mitchell is a 83 y.o. female who presents to the clinic today for:   HPI     Retina Follow Up   Patient presents with  CRVO/BRVO.  In both eyes.  This started 7 weeks ago.  I, the attending physician,  performed the HPI with the patient and updated documentation appropriately.        Comments   Patient here for 7 weeks retina follow up for BRVO OU. Patient states vision is doing some better. Still has blind spot OS. Some days real dark. Some days grayish looking. Some times has a little eye pain.       Last edited by Rania Prothero, MD on 04/24/2024  4:55 PM.    Pt feels like her vision may be doing better, sometimes she sees a black or grey spot in her vision that she can't see through, she states her BP has been spiking  Referring physician: Amedeo Jupiter, MD [address] [place],  [place] [address]  HISTORICAL INFORMATION:   Selected notes from the MEDICAL RECORD NUMBER Referred by Dr. Lasandra Points for concern of BDR / PDR LEE:  Ocular Hx- PMH-    CURRENT MEDICATIONS: Current Outpatient Medications (Ophthalmic Drugs)  Medication Sig   latanoprost  (XALATAN ) 0.005 % ophthalmic solution Place 1 drop into both eyes at bedtime.   No current facility-administered medications for this visit. (Ophthalmic Drugs)   Current Outpatient Medications (Other)  Medication Sig   acetaminophen  (TYLENOL ) 500 MG tablet Take 1,000 mg by mouth every 12 (twelve) hours.   albuterol  (VENTOLIN  HFA) 108 (90 Base) MCG/ACT inhaler Inhale 2 puffs into the lungs every 8 (eight) hours as needed for wheezing or shortness of breath.   Amino Acids-Protein Hydrolys (PRO-STAT AWC) LIQD Take 30 mLs by mouth 2 (two) times daily between meals.   amLODipine  (NORVASC ) 5 MG tablet Take 1 tablet (5 mg total) by mouth daily.   aspirin EC 81 MG tablet Take  81 mg by mouth daily. Swallow whole.   furosemide  (LASIX ) 20 MG tablet Take 20 mg by mouth daily.   levothyroxine (SYNTHROID) 25 MCG tablet Take 25 mcg by mouth daily before breakfast.   losartan  (COZAAR ) 100 MG tablet Take 1 tablet (100 mg total) by mouth daily.   lubiprostone  (AMITIZA ) 8 MCG capsule Take 1 capsule (8 mcg total) by mouth daily with breakfast.   medroxyPROGESTERone  (PROVERA ) 10 MG tablet TAKE 1 TABLET BY MOUTH DAILY 10 DAYS PER MONTH (Patient taking differently: Take 10 mg by mouth See admin instructions. Take 10 mg once daily on the 1st-10th day of each month)   Multiple Vitamins-Minerals (MULTIVITAMIN WITH MINERALS) tablet Take 1 tablet by mouth daily.   nystatin powder Apply 1 Application topically 3 (three) times daily.   omeprazole  (PRILOSEC) 20 MG capsule Take 1 capsule (20 mg total) by mouth daily.   polyethylene glycol powder (GLYCOLAX /MIRALAX ) 17 GM/SCOOP powder Take 17 g by mouth daily.   pravastatin  (PRAVACHOL ) 40 MG tablet Take 40 mg by mouth at bedtime.   Semaglutide -Weight Management 2.4 MG/0.75ML SOAJ Inject 2.4 mg into the skin once a week.   sertraline  (ZOLOFT ) 100 MG tablet Take 100 mg by mouth daily.   No current facility-administered medications for this visit. (Other)   REVIEW OF SYSTEMS: ROS   Positive for: Musculoskeletal, Cardiovascular, Eyes, Respiratory Last edited  by Sylvan Evener, COA on 04/24/2024  9:10 AM.       ALLERGIES Allergies  Allergen Reactions   Ace Inhibitors Itching   Nsaids Itching   PAST MEDICAL HISTORY Past Medical History:  Diagnosis Date   Endometrial polyp    Dr. Randolm [place]   History of endometrial hyperplasia    HTN (hypertension)    Hx of adenomatous colonic polyps 1999   Hyperlipidemia    Mental disorder    GAD   Obesity    Osteoarthritis    Tachycardia    subsequently with bradycardia (?secondary to meds)   Past Surgical History:  Procedure Laterality Date   BACK SURGERY     bilateral knee replacements   2002/2003   CHOLECYSTECTOMY     COLONOSCOPY  03/2002   Dr. Tish Forge internal hemorrhoids   COLONOSCOPY  06/21/2011   polyp at IC valve (tubular adenoma)   COLONOSCOPY N/A 07/15/2016   one 8 mm polyp at hepatic flexure (tubular adenoma), on 12 mm polyp at hepatic flexure (tubular adenoma), 3 year surveillance if health permits   COLONOSCOPY N/A 10/10/2019   descending colon diverticulosis, two 4-5 mm polyps at splenic flexure (tubular adenomas). No surveillance due to age.    LUMBAR LAMINECTOMY/DECOMPRESSION MICRODISCECTOMY N/A 04/27/2022   Procedure: Thoracic Eleven-Twelve Decompression;  Surgeon: Audie Bleacher, MD;  Location: [facility] OR;  Service: Neurosurgery;  Laterality: N/A;   POLYPECTOMY  07/15/2016   Procedure: POLYPECTOMY;  Surgeon: Suzette Espy, MD;  Location: [facility];  Service: Endoscopy;;  Splenic Flexure polyps x 2 removed via hot snare   POLYPECTOMY  10/10/2019   Procedure: POLYPECTOMY;  Surgeon: Suzette Espy, MD;  Location: [facility];  Service: Endoscopy;;   TUBAL LIGATION     FAMILY HISTORY Family History  Problem Relation Age of Onset   Diabetes Brother    Colon polyps Brother    Diabetes Sister    Cancer Father        Likely prostate   Stroke Mother 5   Colon cancer Neg Hx    SOCIAL HISTORY Social History   Tobacco Use   Smoking status: Never   Smokeless tobacco: Never  Vaping Use   Vaping status: Never Used  Substance Use Topics   Alcohol use: No    Alcohol/week: 0.0 standard drinks of alcohol   Drug use: No       OPHTHALMIC EXAM:  Base Eye Exam     Visual Acuity (Snellen - Linear)       Right Left   Dist cc 20/40 -2 20/200 -1   Dist ph cc NI 20/150 -2    Correction: Glasses         Tonometry (Tonopen, 9:07 AM)       Right Left   Pressure 15 12         Pupils       Dark Light Shape React APD   Right 3 2 Round Slow None   Left 3 2 Round Slow None         Visual Fields (Counting fingers)       Left Right    Full  Full         Extraocular Movement       Right Left    Full, Ortho Full, Ortho         Neuro/Psych     Oriented x3: Yes   Mood/Affect: Normal         Dilation  Both eyes: 1.0% Mydriacyl, 2.5% Phenylephrine  @ 9:07 AM           Slit Lamp and Fundus Exam     Slit Lamp Exam       Right Left   Lids/Lashes Dermatochalasis - upper lid Dermatochalasis - upper lid   Conjunctiva/Sclera mild melanosis mild melanosis   Cornea arcus arcus   Anterior Chamber deep and clear deep and clear   Iris Round and dilated Round and dilated   Lens 2+ Nuclear sclerosis, 2+ Cortical cataract 2-3+ Nuclear sclerosis, 2-3+ Cortical cataract   Anterior Vitreous Vitreous syneresis, Posterior vitreous detachment, vitreous condensations Vitreous syneresis, Posterior vitreous detachment         Fundus Exam       Right Left   Disc nasal hyperemia -- improved, sharp rim, PPA, mild temporal pallor, no heme, disc elevation, but no frank disc edema Pink and Sharp, mild tilt, +PPA, no edema, focal hyperemia superiorly   C/D Ratio 0.3 0.3   Macula Flat, good foveal reflex, inferior macula atrophic, RPE mottling and clumping, subretinal heme and edema superior macula -- increased Blunted foveal reflex, persistent central and inferior IRH and exudates -- slightly improved, central cystic changes -- increased, mild ERM   Vessels attenuated, Tortuous, macroanuerysms with heme along ST arcades -- increased attenuated, Tortuous, retinal macroaneurysms along IT arcades--improved   Periphery Attached, scattered MA / DBH Attached, scattered IRH--improved/essentially resolved, cluster of exudates IT periphery           Refraction     Wearing Rx       Sphere Cylinder Axis Add   Right -1.25 +0.50 163 +2.00   Left -0.75 +0.50 153 +2.00           IMAGING AND PROCEDURES  Imaging and Procedures for 04/24/2024  OCT, Retina - OU - Both Eyes        Right Eye Quality was good. Central Foveal  Thickness: 275. Progression has worsened. Findings include abnormal foveal contour, myopic contour, intraretinal hyper-reflective material, intraretinal fluid, subretinal fluid, inner retinal atrophy (Interval worsening of IRF/SRF/IRHM (macroanuerysm) along ST arcades, Diffuse IRA inferior macula / hemisphere -- remote BRAO; disc edema -- slightly increased).   Left Eye Quality was good. Central Foveal Thickness: 456. Progression has worsened. Findings include abnormal foveal contour, myopic contour, retinal drusen , subretinal hyper-reflective material, intraretinal hyper-reflective material, intraretinal fluid, outer retinal atrophy (Persistent disc elevation -- increased, ERM with central thickening and ORA; drusen, SRHM, IRF/IRHM -- increased).   Notes  *Images captured and stored on drive  Diagnosis / Impression:  OD: Interval worsening of IRF/SRF/IRHM (macroanuerysm) along ST arcades, Diffuse IRA inferior macula / hemisphere -- remote BRAO; disc edema -- slightly increased OS: Persistent disc elevation -- increased, ERM with central thickening and ORA; drusen, SRHM, IRF/IRHM -- increased  Clinical management:  See below  Abbreviations: NFP - Normal foveal profile. CME - cystoid macular edema. PED - pigment epithelial detachment. IRF - intraretinal fluid. SRF - subretinal fluid. EZ - ellipsoid zone. ERM - epiretinal membrane. ORA - outer retinal atrophy. ORT - outer retinal tubulation. SRHM - subretinal hyper-reflective material. IRHM - intraretinal hyper-reflective material      Intravitreal Injection, Pharmacologic Agent - OD - Right Eye       Time Out 04/24/2024. 10:54 AM. Confirmed correct patient, procedure, site, and patient consented.   Anesthesia Topical anesthesia was used. Anesthetic medications included Lidocaine  2%, Proparacaine 0.5%.   Procedure Preparation included 5% betadine to ocular surface, eyelid speculum. A (  33g) needle was used.   Injection: 2 mg  aflibercept  2 MG/0.05ML   Route: Intravitreal, Site: Right Eye   NDC: D2246706, Lot: [id number], Expiration date: 06/04/2025, Waste: 0 mL   Post-op Post injection exam found visual acuity of at least counting fingers. The patient tolerated the procedure well. There were no complications. The patient received written and verbal post procedure care education. Post injection medications were not given.      Intravitreal Injection, Pharmacologic Agent - OS - Left Eye       Time Out 04/24/2024. 10:54 AM. Confirmed correct patient, procedure, site, and patient consented.   Anesthesia Topical anesthesia was used. Anesthetic medications included Lidocaine  2%, Proparacaine 0.5%.   Procedure Preparation included 5% betadine to ocular surface, eyelid speculum. A (33g) needle was used.   Injection: 2 mg aflibercept  2 MG/0.05ML   Route: Intravitreal, Site: Left Eye   NDC: D2246706, Lot: [id number], Expiration date: 07/05/2025, Waste: 0 mL   Post-op Post injection exam found visual acuity of at least counting fingers. The patient tolerated the procedure well. There were no complications. The patient received written and verbal post procedure care education. Post injection medications were not given.            ASSESSMENT/PLAN:    ICD-10-CM   1. 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 Essential hypertension  I10     4. Hypertensive retinopathy of both eyes  H35.033     5. Combined forms of age-related cataract of both eyes  H25.813     6. 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 Branch retinal vein occlusion of left eye with macular edema  H34.8320     8.  Retinal artery branch occlusion of right eye  H34.231      **delayed f/u - 6.9 wks instead of 5-6 wks**  1-3. BRVO w/ CME and retinal macronauerysm OS - s/p IVA OS #1 (12.15.23) #2 (01.12.24), #3 (02.09.24), #4 (03.15.24), #5 (04.12.24), #6 (06.07.24) ============================= - s/p IVE OS #1 (05.10.24), #2 (07.08.24) #3(8.9.24), #4 (09.09.24), #5 (10.11.24), #6 (11.15.24), #7 (12.20.24) #8(01.24.25), #9 (02.28.25), #10 (04.02.25) - pt previously followed with Dr. Royston Cornea in [place], [place], but now lives in a facility in [place] and cannot be transported to [place] - history of laser photoablation of retinal macroaneurysm OS per chart review - FA 12.15.23 shows focal MAs w/ late leakage - BCVA OS stable at 20/150 - exam shows macroaneurysm with surrounding circinate exudates and edema, inferior macula OS -- improved - OCT shows OS: Persistent disc elevation -- increased, ERM with central thickening and ORA; drusen, SRHM, IRF/IRHM -- increased at 6.9 weeks - recommend IVE OS #11 today, 05.20.25 w/ f/u back to 4 wks - RBA of procedure discussed, questions answered - IVE informed consent obtained and signed, 05.20.25 (OU) - see procedure note - f/u 4 weeks-- DFE/OCT/possible injection ** new BRAO superior macula/fovea OS on 01.24.25 visit**  - pt reported 3 day history of central visual field loss w/ extension to inferior paracentral VF  - pt reports recent spikes in SBP >200  - discussed  findings, prognosis  - monitor  - f/u 4 weeks, DFE, OCT  1-3. 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 - s/p IVA OD #1 (06.07.24) #2 (07.08.24), #3 (08.09.24), #4 (09.06.24), #5 (10.10.24), #6 (11.15.24) - OCT shows OD: Interval worsening of IRF/SRF/IRHM (macroanuerysm) along ST arcades, Diffuse IRA inferior macula / hemisphere -- remote BRAO; disc edema -- slightly increased at 6.9 weeks - exam shows peripapillary hemorrhage, mild NVD nasal disc -- improved; macroaneurysm w/ surrounding heme and edema  along ST arcades -- improved  - FA 12.15.23 shows mild NVD nasal disc w/ leakage  - pt may benefit from PRP   - BCVA OD decreased to 20/40 from 20/30  - recommend IVE OD #1 today, 05.20.25  - pt wishes to proceed with injection  - RBA of procedure discussed, questions answered - IVE informed consent obtained and signed, 05.20.25 (OU) - see procedure note - f/u 4 weeks, DFE, OCT  4,5. Hypertensive retinopathy OU  - pt and son report recent changes in BP meds - discussed importance of tight BP control and likely relationship with BRVOs  6. Mixed Cataract OU - The symptoms of cataract, surgical options, and treatments and risks were discussed with patient. - discussed diagnosis and progression - likely visually significant - under the expert management of Dr. Lasandra Points  - had appt with Dr. Ami Balboa on November 10, 2023 who did not recommend cataract surgery at this time - clear from a retina standpoint to proceed with cataract surgery when pt and surgeon are ready  - would recommend timing cataract surgery ~1-2 wks after anti-VEGF injection  Ophthalmic Meds Ordered this visit:  Meds ordered this encounter  Medications   aflibercept  (EYLEA ) SOLN 2 mg   aflibercept  (EYLEA ) SOLN 2 mg     Return in about 4 weeks (around 05/22/2024) for f/u BRVO OU, DFE, OCT, Possible Injxn.  There are no Patient Instructions on file for this visit.   This document serves as a record of services personally performed by Jeanice Millard, MD, PhD. It was created on their behalf by Olene Berne, COT an ophthalmic technician. The creation of this record is the provider's dictation and/or activities during the visit.    Electronically signed by:  Olene Berne, COT  04/29/24 1:22 AM  This document serves as a record of services personally performed by Jeanice Millard, MD, PhD. It was created on their behalf by Morley Arabia. Bevin Bucks, OA an ophthalmic technician. The creation of this record is the  provider's dictation and/or activities during the visit.    Electronically signed by: Morley Arabia. Bevin Bucks, OA 04/29/24 1:22 AM  Jeanice Millard, M.D., Ph.D. Diseases & Surgery of the Retina and Vitreous [facility]  I have reviewed the above documentation for accuracy and completeness, and I agree with the above. Jeanice Millard, M.D., Ph.D. 04/29/24 1:25 AM   Abbreviations: M myopia (nearsighted); A astigmatism; H hyperopia (farsighted); P presbyopia; Mrx spectacle prescription;  CTL contact lenses; OD right eye; OS left eye; OU both eyes  XT exotropia; ET esotropia; PEK punctate epithelial keratitis; PEE punctate epithelial erosions; DES dry eye syndrome; MGD meibomian gland dysfunction; ATs artificial tears; PFAT's preservative free artificial tears; NSC nuclear sclerotic cataract; PSC posterior subcapsular cataract; ERM epi-retinal membrane; PVD posterior vitreous detachment; RD retinal detachment; DM diabetes mellitus; DR diabetic retinopathy; NPDR non-proliferative diabetic retinopathy; PDR proliferative diabetic retinopathy; CSME clinically significant macular edema; DME diabetic macular edema; dbh dot blot hemorrhages; CWS  cotton wool spot; POAG primary open angle glaucoma; C/D cup-to-disc ratio; HVF humphrey visual field; GVF goldmann visual field; OCT optical coherence tomography; IOP intraocular pressure; BRVO Branch retinal vein occlusion; CRVO central retinal vein occlusion; CRAO central retinal artery occlusion; BRAO branch retinal artery occlusion; RT retinal tear; SB scleral buckle; PPV pars plana vitrectomy; VH Vitreous hemorrhage; PRP panretinal laser photocoagulation; IVK intravitreal kenalog; VMT vitreomacular traction; MH Macular hole;  NVD neovascularization of the disc; NVE neovascularization elsewhere; AREDS age related eye disease study; ARMD age related macular degeneration; POAG primary open angle glaucoma; EBMD epithelial/anterior basement membrane dystrophy;  ACIOL anterior chamber intraocular lens; IOL intraocular lens; PCIOL posterior chamber intraocular lens; Phaco/IOL phacoemulsification with intraocular lens placement; PRK photorefractive keratectomy; LASIK laser assisted in situ keratomileusis; HTN hypertension; DM diabetes mellitus; COPD chronic obstructive pulmonary disease

## 2024-04-13 ENCOUNTER — Encounter (INDEPENDENT_AMBULATORY_CARE_PROVIDER_SITE_OTHER): Admitting: Ophthalmology

## 2024-04-24 ENCOUNTER — Ambulatory Visit (INDEPENDENT_AMBULATORY_CARE_PROVIDER_SITE_OTHER): Admitting: Ophthalmology

## 2024-04-24 ENCOUNTER — Encounter (INDEPENDENT_AMBULATORY_CARE_PROVIDER_SITE_OTHER): Payer: Self-pay | Admitting: Ophthalmology

## 2024-04-24 DIAGNOSIS — H3509 Other intraretinal microvascular abnormalities: Secondary | ICD-10-CM

## 2024-04-24 DIAGNOSIS — I10 Essential (primary) hypertension: Secondary | ICD-10-CM | POA: Diagnosis not present

## 2024-04-24 DIAGNOSIS — H34832 Tributary (branch) retinal vein occlusion, left eye, with macular edema: Secondary | ICD-10-CM

## 2024-04-24 DIAGNOSIS — H34231 Retinal artery branch occlusion, right eye: Secondary | ICD-10-CM

## 2024-04-24 DIAGNOSIS — H34833 Tributary (branch) retinal vein occlusion, bilateral, with macular edema: Secondary | ICD-10-CM

## 2024-04-24 DIAGNOSIS — H35033 Hypertensive retinopathy, bilateral: Secondary | ICD-10-CM

## 2024-04-24 DIAGNOSIS — H34233 Retinal artery branch occlusion, bilateral: Secondary | ICD-10-CM

## 2024-04-24 DIAGNOSIS — H25813 Combined forms of age-related cataract, bilateral: Secondary | ICD-10-CM

## 2024-04-24 MED ORDER — AFLIBERCEPT 2MG/0.05ML IZ SOLN FOR KALEIDOSCOPE
2.0000 mg | INTRAVITREAL | Status: AC | PRN
  Administered 2024-04-24: 2 mg via INTRAVITREAL

## 2024-05-16 NOTE — Progress Notes (Signed)
 [facility] - Clinic Note  05/25/2024     CHIEF COMPLAINT Patient presents for Retina Follow Up   HISTORY OF PRESENT ILLNESS: Shelley Mitchell is a 83 y.o. female who presents to the clinic today for:   HPI     Retina Follow Up   Patient presents with  CRVO/BRVO.  In both eyes.  This started 4 weeks ago.  I, the attending physician,  performed the HPI with the patient and updated documentation appropriately.        Comments   Patient here for 4 weeks retina follow up for BRVO OU. Patient states vision about the same. No eye pain.       Last edited by Ronelle Coffee, MD on 05/25/2024  4:43 PM.    Pts son states they are still working on her BP, but it is doing better  Referring physician: Amedeo Jupiter, MD [address] [place],  [place] [address]  HISTORICAL INFORMATION:   Selected notes from the MEDICAL RECORD NUMBER Referred by Dr. Lasandra Points for concern of BDR / PDR LEE:  Ocular Hx- PMH-    CURRENT MEDICATIONS: Current Outpatient Medications (Ophthalmic Drugs)  Medication Sig   latanoprost  (XALATAN ) 0.005 % ophthalmic solution Place 1 drop into both eyes at bedtime.   No current facility-administered medications for this visit. (Ophthalmic Drugs)   Current Outpatient Medications (Other)  Medication Sig   acetaminophen  (TYLENOL ) 500 MG tablet Take 1,000 mg by mouth every 12 (twelve) hours.   albuterol  (VENTOLIN  HFA) 108 (90 Base) MCG/ACT inhaler Inhale 2 puffs into the lungs every 8 (eight) hours as needed for wheezing or shortness of breath.   Amino Acids-Protein Hydrolys (PRO-STAT AWC) LIQD Take 30 mLs by mouth 2 (two) times daily between meals.   amLODipine  (NORVASC ) 10 MG tablet Take 1 tablet (10 mg total) by mouth daily.   aspirin EC 81 MG tablet Take 81 mg by mouth daily. Swallow whole.   furosemide  (LASIX ) 20 MG tablet Take 20 mg by mouth daily.   levothyroxine (SYNTHROID) 25 MCG tablet Take 25 mcg by mouth daily before breakfast.    losartan  (COZAAR ) 100 MG tablet Take 1 tablet (100 mg total) by mouth daily.   lubiprostone  (AMITIZA ) 8 MCG capsule Take 1 capsule (8 mcg total) by mouth daily with breakfast.   medroxyPROGESTERone  (PROVERA ) 10 MG tablet TAKE 1 TABLET BY MOUTH DAILY 10 DAYS PER MONTH   Multiple Vitamins-Minerals (MULTIVITAMIN WITH MINERALS) tablet Take 1 tablet by mouth daily.   nystatin powder Apply 1 Application topically 3 (three) times daily.   omeprazole  (PRILOSEC) 20 MG capsule Take 1 capsule (20 mg total) by mouth daily.   polyethylene glycol powder (GLYCOLAX /MIRALAX ) 17 GM/SCOOP powder Take 17 g by mouth daily.   pravastatin  (PRAVACHOL ) 40 MG tablet Take 40 mg by mouth at bedtime.   sertraline  (ZOLOFT ) 100 MG tablet Take 100 mg by mouth daily.   Semaglutide -Weight Management 2.4 MG/0.75ML SOAJ Inject 2.4 mg into the skin once a week. (Patient not taking: Reported on 05/25/2024)   No current facility-administered medications for this visit. (Other)   REVIEW OF SYSTEMS: ROS   Positive for: Musculoskeletal, Cardiovascular, Eyes, Respiratory Last edited by Sylvan Evener, COA on 05/25/2024  9:43 AM.        ALLERGIES Allergies  Allergen Reactions   Ace Inhibitors Itching   Nsaids Itching   PAST MEDICAL HISTORY Past Medical History:  Diagnosis Date   Endometrial polyp    Dr. Randolm [place]  History of endometrial hyperplasia    HTN (hypertension)    Hx of adenomatous colonic polyps 1999   Hyperlipidemia    Mental disorder    GAD   Obesity    Osteoarthritis    Tachycardia    subsequently with bradycardia (?secondary to meds)   Past Surgical History:  Procedure Laterality Date   BACK SURGERY     bilateral knee replacements  2002/2003   CHOLECYSTECTOMY     COLONOSCOPY  03/2002   Dr. Tish Forge internal hemorrhoids   COLONOSCOPY  06/21/2011   polyp at IC valve (tubular adenoma)   COLONOSCOPY N/A 07/15/2016   one 8 mm polyp at hepatic flexure (tubular adenoma), on 12 mm polyp at hepatic  flexure (tubular adenoma), 3 year surveillance if health permits   COLONOSCOPY N/A 10/10/2019   descending colon diverticulosis, two 4-5 mm polyps at splenic flexure (tubular adenomas). No surveillance due to age.    LUMBAR LAMINECTOMY/DECOMPRESSION MICRODISCECTOMY N/A 04/27/2022   Procedure: Thoracic Eleven-Twelve Decompression;  Surgeon: Audie Bleacher, MD;  Location: [facility] OR;  Service: Neurosurgery;  Laterality: N/A;   POLYPECTOMY  07/15/2016   Procedure: POLYPECTOMY;  Surgeon: Suzette Espy, MD;  Location: [facility];  Service: Endoscopy;;  Splenic Flexure polyps x 2 removed via hot snare   POLYPECTOMY  10/10/2019   Procedure: POLYPECTOMY;  Surgeon: Suzette Espy, MD;  Location: [facility];  Service: Endoscopy;;   TUBAL LIGATION     FAMILY HISTORY Family History  Problem Relation Age of Onset   Diabetes Brother    Colon polyps Brother    Diabetes Sister    Cancer Father        Likely prostate   Stroke Mother 50   Colon cancer Neg Hx    SOCIAL HISTORY Social History   Tobacco Use   Smoking status: Never   Smokeless tobacco: Never  Vaping Use   Vaping status: Never Used  Substance Use Topics   Alcohol use: No    Alcohol/week: 0.0 standard drinks of alcohol   Drug use: No       OPHTHALMIC EXAM:  Base Eye Exam     Visual Acuity (Snellen - Linear)       Right Left   Dist cc 20/30 -1 20/150   Dist ph cc NI NI    Correction: Glasses         Tonometry (Tonopen, 9:41 AM)       Right Left   Pressure 17 15         Pupils       Dark Light Shape React APD   Right 3 2 Round Slow None   Left 3 2 Round Slow None         Visual Fields (Counting fingers)       Left Right    Full Full         Extraocular Movement       Right Left    Full, Ortho Full, Ortho         Neuro/Psych     Oriented x3: Yes   Mood/Affect: Normal         Dilation     Both eyes: 1.0% Mydriacyl, 2.5% Phenylephrine  @ 9:41 AM           Slit Lamp and Fundus Exam      Slit Lamp Exam       Right Left   Lids/Lashes Dermatochalasis - upper lid Dermatochalasis - upper lid   Conjunctiva/Sclera mild  melanosis mild melanosis   Cornea arcus arcus   Anterior Chamber deep and clear deep and clear   Iris Round and dilated Round and dilated   Lens 2+ Nuclear sclerosis, 2+ Cortical cataract 2-3+ Nuclear sclerosis, 2-3+ Cortical cataract   Anterior Vitreous Vitreous syneresis, Posterior vitreous detachment, vitreous condensations Vitreous syneresis, Posterior vitreous detachment         Fundus Exam       Right Left   Disc nasal hyperemia, sharp rim, PPA, mild temporal pallor, no heme, disc elevation, but no frank disc edema Pink and Sharp, mild tilt, +PPA, no edema, focal hyperemia superiorly   C/D Ratio 0.3 0.3   Macula Flat, good foveal reflex, inferior macula atrophic, RPE mottling and clumping, subretinal heme and edema superior macula -- improved, punctate exudates superior macula Blunted foveal reflex, persistent central and inferior IRH and exudates -- slightly improved, central cystic changes -- slightly improved, mild ERM   Vessels attenuated, Tortuous, macroanuerysm with heme along ST arcades -- improving attenuated, Tortuous, retinal macroaneurysms along IT arcades--improved   Periphery Attached, scattered MA / DBH Attached, scattered IRH--improved/essentially resolved, cluster of exudates IT periphery           Refraction     Wearing Rx       Sphere Cylinder Axis Add   Right -1.25 +0.50 163 +2.00   Left -0.75 +0.50 153 +2.00           IMAGING AND PROCEDURES  Imaging and Procedures for 05/25/2024  OCT, Retina - OU - Both Eyes       Right Eye Quality was good. Central Foveal Thickness: 229. Progression has improved. Findings include normal foveal contour, no IRF, no SRF, myopic contour, intraretinal hyper-reflective material, inner retinal atrophy (Interval improvement in IRF/SRF/IRHM (macroanuerysm) along ST arcades, Diffuse IRA  inferior macula / hemisphere -- remote BRAO; mild disc edema ).   Left Eye Quality was good. Central Foveal Thickness: 448. Progression has improved. Findings include abnormal foveal contour, myopic contour, retinal drusen , subretinal hyper-reflective material, intraretinal hyper-reflective material, intraretinal fluid, outer retinal atrophy (Persistent disc elevation -- increased, ERM with central thickening and ORA; drusen, SRHM, IRF/IRHM -- slightly improved).   Notes *Images captured and stored on drive  Diagnosis / Impression:  OD: Interval improvement in IRF/SRF/IRHM (macroanuerysm) along ST arcades, Diffuse IRA inferior macula / hemisphere -- remote BRAO; mild disc edema  OS: Persistent disc elevation -- increased, ERM with central thickening and ORA; drusen, SRHM, IRF/IRHM -- improved  Clinical management:  See below  Abbreviations: NFP - Normal foveal profile. CME - cystoid macular edema. PED - pigment epithelial detachment. IRF - intraretinal fluid. SRF - subretinal fluid. EZ - ellipsoid zone. ERM - epiretinal membrane. ORA - outer retinal atrophy. ORT - outer retinal tubulation. SRHM - subretinal hyper-reflective material. IRHM - intraretinal hyper-reflective material      Intravitreal Injection, Pharmacologic Agent - OD - Right Eye       Time Out 05/25/2024. 11:10 AM. Confirmed correct patient, procedure, site, and patient consented.   Anesthesia Topical anesthesia was used. Anesthetic medications included Lidocaine  2%, Proparacaine 0.5%.   Procedure Preparation included 5% betadine to ocular surface, eyelid speculum. A (33g) needle was used.   Injection: 2 mg aflibercept  2 MG/0.05ML   Route: Intravitreal, Site: Right Eye   NDC: Q956576, Lot: [id number], Expiration date: 08/04/2025, Waste: 0 mL   Post-op Post injection exam found visual acuity of at least counting fingers. The patient tolerated the procedure well. There were no  complications. The patient received  written and verbal post procedure care education. Post injection medications were not given.      Intravitreal Injection, Pharmacologic Agent - OS - Left Eye       Time Out 05/25/2024. 11:11 AM. Confirmed correct patient, procedure, site, and patient consented.   Anesthesia Topical anesthesia was used. Anesthetic medications included Lidocaine  2%, Proparacaine 0.5%.   Procedure Preparation included 5% betadine to ocular surface, eyelid speculum. A (33g) needle was used.   Injection: 2 mg aflibercept  2 MG/0.05ML   Route: Intravitreal, Site: Left Eye   NDC: Q956576, Lot: [id number], Expiration date: 10/04/2025, Waste: 0 mL   Post-op Post injection exam found visual acuity of at least counting fingers. The patient tolerated the procedure well. There were no complications. The patient received written and verbal post procedure care education. Post injection medications were not given.             ASSESSMENT/PLAN:    ICD-10-CM   1. 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 Essential hypertension  I10     4. Hypertensive retinopathy of both eyes  H35.033     5. Combined forms of age-related cataract of both eyes  H25.813     6. 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 BRVO w/ CME and retinal macronauerysm OS - s/p IVA OS #1 (12.15.23) #2 (01.12.24), #3 (02.09.24), #4 (03.15.24), #5 (04.12.24), #6 (06.07.24) ============================= - s/p IVE OS #1 (05.10.24), #2 (07.08.24) #3(8.9.24), #4 (09.09.24), #5 (10.11.24), #6 (11.15.24), #7 (12.20.24) #8(01.24.25), #9 (02.28.25), #10 (04.02.25) - pt previously followed with Dr. Royston Cornea in [place], [place], but  now lives in a facility in [place] and cannot be transported to [place] - history of laser photoablation of retinal macroaneurysm OS per chart review - FA 12.15.23 shows focal MAs w/ late leakage - BCVA OS stable at 20/150 - exam shows macroaneurysm with surrounding circinate exudates and edema, inferior macula OS -- improved - OCT shows OS: Persistent disc elevation -- increased, ERM with central thickening and ORA; drusen, SRHM, IRF/IRHM -- improved at 4 weeks - recommend IVE OS #11 today, 05.20.25 w/ f/u in 4 wks - RBA of procedure discussed, questions answered - IVE informed consent obtained and signed, 05.20.25 (OU) - see procedure note - f/u 4 weeks-- DFE/OCT/possible injection ** new BRAO superior macula/fovea OS on 01.24.25 visit**  - pt reported 3 day history of central visual field loss w/ extension to inferior paracentral VF  - pt reports recent spikes in SBP >200  - discussed findings, prognosis  - f/u 4 weeks, DFE, OCT, possible injxn  1-3. 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 - s/p IVA OD #1 (06.07.24) #2 (07.08.24), #3 (08.09.24), #4 (09.06.24), #5 (10.10.24), #6 (11.15.24) - s/p IVE OD #1 (05.20.25) - exam shows peripapillary hemorrhage, mild NVD nasal disc -- improved; macroaneurysm w/ surrounding heme and edema along ST arcades -- improved  - FA 12.15.23 shows mild NVD nasal disc w/ leakage  - pt may benefit from PRP   - BCVA OD 20/30 from 20/40 - OCT shows OD:  interval improvement in IRF/SRF/IRHM (macroanuerysm) along ST arcades, Diffuse IRA inferior macula / hemisphere -- remote BRAO; mild disc edema at 4 wks  - recommend IVE OD #2 today, 06.20.25 w/ f/u in 4 wks  - pt wishes to proceed with injection  - RBA of procedure discussed, questions answered - IVE informed consent obtained and signed, 05.20.25 (OU) - see procedure note - f/u 4 weeks, DFE, OCT, possible injxn  4,5. Hypertensive retinopathy OU  - pt and son report recent changes in BP meds -  discussed importance of tight BP control and likely relationship with BRVOs  6. Mixed Cataract OU - The symptoms of cataract, surgical options, and treatments and risks were discussed with patient. - discussed diagnosis and progression - likely visually significant - under the expert management of Dr. Lasandra Points  - had appt with Dr. Ami Balboa on November 10, 2023 who did not recommend cataract surgery at this time - clear from a retina standpoint to proceed with cataract surgery when pt and surgeon are ready  - would recommend timing cataract surgery ~1-2 wks after anti-VEGF injection  Ophthalmic Meds Ordered this visit:  Meds ordered this encounter  Medications   aflibercept  (EYLEA ) SOLN 2 mg   aflibercept  (EYLEA ) SOLN 2 mg     Return in about 4 weeks (around 06/22/2024) for f/u BRVO OU, DFE, OCT, Possible Injxn.  There are no Patient Instructions on file for this visit.  This document serves as a record of services personally performed by Jeanice Millard, MD, PhD. It was created on their behalf by Angelia Kelp, an ophthalmic technician. The creation of this record is the provider's dictation and/or activities during the visit.    Electronically signed by: Angelia Kelp, OA, 05/25/24  4:47 PM  This document serves as a record of services personally performed by Jeanice Millard, MD, PhD. It was created on their behalf by Morley Arabia. Bevin Bucks, OA an ophthalmic technician. The creation of this record is the provider's dictation and/or activities during the visit.    Electronically signed by: Morley Arabia. Bevin Bucks, OA 05/25/24 4:47 PM  Jeanice Millard, M.D., Ph.D. Diseases & Surgery of the Retina and Vitreous [facility]  I have reviewed the above documentation for accuracy and completeness, and I agree with the above. Jeanice Millard, M.D., Ph.D. 05/25/24 4:47 PM   Abbreviations: M myopia (nearsighted); A astigmatism; H hyperopia (farsighted); P presbyopia; Mrx  spectacle prescription;  CTL contact lenses; OD right eye; OS left eye; OU both eyes  XT exotropia; ET esotropia; PEK punctate epithelial keratitis; PEE punctate epithelial erosions; DES dry eye syndrome; MGD meibomian gland dysfunction; ATs artificial tears; PFAT's preservative free artificial tears; NSC nuclear sclerotic cataract; PSC posterior subcapsular cataract; ERM epi-retinal membrane; PVD posterior vitreous detachment; RD retinal detachment; DM diabetes mellitus; DR diabetic retinopathy; NPDR non-proliferative diabetic retinopathy; PDR proliferative diabetic retinopathy; CSME clinically significant macular edema; DME diabetic macular edema; dbh dot blot hemorrhages; CWS cotton wool spot; POAG primary open angle glaucoma; C/D cup-to-disc ratio; HVF humphrey visual field; GVF goldmann visual field; OCT optical coherence tomography; IOP intraocular pressure; BRVO Branch retinal vein occlusion; CRVO central retinal vein occlusion; CRAO central retinal artery occlusion; BRAO branch retinal artery occlusion; RT retinal tear; SB scleral buckle; PPV pars plana vitrectomy; VH Vitreous hemorrhage; PRP panretinal laser photocoagulation; IVK intravitreal kenalog; VMT vitreomacular traction; MH Macular hole;  NVD neovascularization of the disc; NVE neovascularization elsewhere; AREDS age related eye disease study; ARMD age related macular  degeneration; POAG primary open angle glaucoma; EBMD epithelial/anterior basement membrane dystrophy; ACIOL anterior chamber intraocular lens; IOL intraocular lens; PCIOL posterior chamber intraocular lens; Phaco/IOL phacoemulsification with intraocular lens placement; PRK photorefractive keratectomy; LASIK laser assisted in situ keratomileusis; HTN hypertension; DM diabetes mellitus; COPD chronic obstructive pulmonary disease

## 2024-05-22 ENCOUNTER — Ambulatory Visit: Attending: Cardiology | Admitting: Cardiology

## 2024-05-22 ENCOUNTER — Encounter: Payer: Self-pay | Admitting: Cardiology

## 2024-05-22 VITALS — BP 179/77 | HR 50 | Ht 62.0 in | Wt 279.5 lb

## 2024-05-22 DIAGNOSIS — I10 Essential (primary) hypertension: Secondary | ICD-10-CM | POA: Diagnosis not present

## 2024-05-22 DIAGNOSIS — R001 Bradycardia, unspecified: Secondary | ICD-10-CM

## 2024-05-22 MED ORDER — AMLODIPINE BESYLATE 10 MG PO TABS: 10.0000 mg | ORAL_TABLET | Freq: Every day | ORAL | 3 refills | Status: DC

## 2024-05-22 NOTE — Patient Instructions (Signed)
 Medication Instructions:   Your physician has recommended you make the following change in your medication:   INCREASE- Amlodipine  10 mg tablet once daily.  *If you need a refill on your cardiac medications before your next appointment, please call your pharmacy*  Lab Work:  NONE  If you have labs (blood work) drawn today and your tests are completely normal, you will receive your results only by: MyChart Message (if you have MyChart) OR A paper copy in the mail If you have any lab test that is abnormal or we need to change your treatment, we will call you to review the results.  Testing/Procedures:  NONE  Follow-Up: At [facility], you and your health needs are our priority.  As part of our continuing mission to provide you with exceptional heart care, our providers are all part of one team.  This team includes your primary Cardiologist (physician) and Advanced Practice Providers or APPs (Physician Assistants and Nurse Practitioners) who all work together to provide you with the care you need, when you need it.  Your next appointment:   3 month(s)  Provider:   You may see Constancia Delton, MD or one of the following Advanced Practice Providers on your designated Care Team:   Laneta Pintos, NP Gildardo Labrador, PA-C Varney Gentleman, PA-C Cadence Wyoming, PA-C Ronald Cockayne, NP Morey Ar, NP    We recommend signing up for the patient portal called MyChart.  Sign up information is provided on this After Visit Summary.  MyChart is used to connect with patients for Virtual Visits (Telemedicine).  Patients are able to view lab/test results, encounter notes, upcoming appointments, etc.  Non-urgent messages can be sent to your provider as well.   To learn more about what you can do with MyChart, go to [email or web address].

## 2024-05-22 NOTE — Progress Notes (Signed)
 Cardiology Office Note:    Date:  05/22/2024   ID:  Shelley Mitchell, DOB March 15, 1941, MRN [id number]  PCP:  Tarri Farm, MD   [facility]  Cardiologist:  Constancia Delton, MD  Advanced Practice Provider:  No care team member to display Electrophysiologist:  None       Referring MD: Tarri Farm, MD   Chief Complaint  Patient presents with   Follow-up    3 month f/u c/o elevated BP and itching all over/rash. Meds reviewed verbally with pt.    History of Present Illness:    Shelley Mitchell is a 83 y.o. female with a hx of hypertension, hyperlipidemia, bradycardia, OSA, presenting for follow-up.  BP elevated at last visit, losartan  increased to 100 mg daily.  Tolerating losartan , Norvasc  5 mg daily as prescribed.  States BP still elevated.  Previously was on the medication 3 times a day when her BP was adequately controlled.  Referred by pulmonary medicine to weight loss clinic in [place].  Waiting for appointment.  Prior notes Cardiac monitor 3/24 showed average heart rate heart rates in the 50s, no high degree AV block noted. Echo 12/2022 EF 60 to 65%, aortic valve sclerosis, impaired relaxation.   Past Medical History:  Diagnosis Date   Endometrial polyp    Dr. Randolm [place]   History of endometrial hyperplasia    HTN (hypertension)    Hx of adenomatous colonic polyps 1999   Hyperlipidemia    Mental disorder    GAD   Obesity    Osteoarthritis    Tachycardia    subsequently with bradycardia (?secondary to meds)    Past Surgical History:  Procedure Laterality Date   BACK SURGERY     bilateral knee replacements  2002/2003   CHOLECYSTECTOMY     COLONOSCOPY  03/2002   Dr. Tish Forge internal hemorrhoids   COLONOSCOPY  06/21/2011   polyp at IC valve (tubular adenoma)   COLONOSCOPY N/A 07/15/2016   one 8 mm polyp at hepatic flexure (tubular adenoma), on 12 mm polyp at hepatic flexure (tubular adenoma), 3 year surveillance if health permits    COLONOSCOPY N/A 10/10/2019   descending colon diverticulosis, two 4-5 mm polyps at splenic flexure (tubular adenomas). No surveillance due to age.    LUMBAR LAMINECTOMY/DECOMPRESSION MICRODISCECTOMY N/A 04/27/2022   Procedure: Thoracic Eleven-Twelve Decompression;  Surgeon: Audie Bleacher, MD;  Location: [facility] OR;  Service: Neurosurgery;  Laterality: N/A;   POLYPECTOMY  07/15/2016   Procedure: POLYPECTOMY;  Surgeon: Suzette Espy, MD;  Location: [facility];  Service: Endoscopy;;  Splenic Flexure polyps x 2 removed via hot snare   POLYPECTOMY  10/10/2019   Procedure: POLYPECTOMY;  Surgeon: Suzette Espy, MD;  Location: [facility];  Service: Endoscopy;;   TUBAL LIGATION      Current Medications: No outpatient medications have been marked as taking for the 05/22/24 encounter (Office Visit) with Constancia Delton, MD.     Allergies:   Ace inhibitors and Nsaids   Social History   Socioeconomic History   Marital status: Married    Spouse name: Not on file   Number of children: 4   Years of education: Not on file   Highest education level: Not on file  Occupational History    Employer: RETIRED  Tobacco Use   Smoking status: Never   Smokeless tobacco: Never  Vaping Use   Vaping status: Never Used  Substance and Sexual Activity   Alcohol use: No    Alcohol/week:  0.0 standard drinks of alcohol   Drug use: No   Sexual activity: Not Currently    Birth control/protection: Post-menopausal  Other Topics Concern   Not on file  Social History Narrative   Not on file   Social Drivers of Health   Financial Resource Strain: Low Risk  (02/07/2024)   Received from [facility]   Overall Financial Resource Strain (CARDIA)    Difficulty of Paying Living Expenses: Not hard at all  Food Insecurity: No Food Insecurity (02/07/2024)   Received from [facility]   Hunger Vital Sign    Within the past 12 months, you worried that your food would run out before you  got the money to buy more.: Never true    Within the past 12 months, the food you bought just didn't last and you didn't have money to get more.: Never true  Transportation Needs: No Transportation Needs (02/07/2024)   Received from [facility] - Transportation    In the past 12 months, has lack of transportation kept you from medical appointments or from getting medications?: No    Lack of Transportation (Non-Medical): No  Physical Activity: Not on file  Stress: Not on file  Social Connections: Not on file     Family History: The patient's family history includes Cancer in her father; Colon polyps in her brother; Diabetes in her brother and sister; Stroke (age of onset: 69) in her mother. There is no history of Colon cancer.  ROS:   Please see the history of present illness.     All other systems reviewed and are negative.  EKGs/Labs/Other Studies Reviewed:    The following studies were reviewed today:       Recent Labs: No results found for requested labs within last 365 days.  Recent Lipid Panel    Component Value Date/Time   CHOL 160 04/24/2022 0341   TRIG 83 04/24/2022 0341   HDL 67 04/24/2022 0341   CHOLHDL 2.4 04/24/2022 0341   VLDL 17 04/24/2022 0341   LDLCALC 76 04/24/2022 0341     Risk Assessment/Calculations:      Physical Exam:    VS:  BP (!) 179/77 (BP Location: Left Wrist, Patient Position: Sitting, Cuff Size: Large)   Pulse (!) 50   Ht 5' 2 (1.575 m)   Wt 279 lb 8 oz (126.8 kg)   SpO2 98%   BMI 51.12 kg/m     Wt Readings from Last 3 Encounters:  05/22/24 279 lb 8 oz (126.8 kg)  02/20/24 268 lb (121.6 kg)  08/18/23 257 lb 6.4 oz (116.8 kg)     GEN:  Well nourished, well developed in no acute distress HEENT: Normal NECK: No JVD; No carotid bruits CARDIAC: Bradycardic, regular, 2/6 systolic murmur RESPIRATORY:  Clear to auscultation without rales, wheezing or rhonchi  ABDOMEN: Soft, non-tender,  non-distended MUSCULOSKELETAL:  1+ edema; No deformity  SKIN: Warm and dry NEUROLOGIC:  Alert and oriented x 3 PSYCHIATRIC:  Normal affect   ASSESSMENT:    1. Primary hypertension   2. Morbid obesity (HCC)   3. Sinus bradycardia    PLAN:    In order of problems listed above:  Hypertension, BP elevated.  Increase Norvasc  to 10 mg daily, continue losartan  100 mg daily.  Add hydralazine  at follow-up visit if BP still not controlled. Morbid obesity, low-calorie diet, agree with referral to weight loss clinic.  Advised to call clinic directly if patient is  not called in the next 1 to 2 weeks. Asymptomatic sinus bradycardia, previous cardiac monitor with no high degree AV block.  No indication for pacemaker.  Patient is wheelchair-bound.  Follow-up in 6 months.   Medication Adjustments/Labs and Tests Ordered: Current medicines are reviewed at length with the patient today.  Concerns regarding medicines are outlined above.  No orders of the defined types were placed in this encounter.  Meds ordered this encounter  Medications   amLODipine  (NORVASC ) 10 MG tablet    Sig: Take 1 tablet (10 mg total) by mouth daily.    Dispense:  30 tablet    Refill:  3    Patient Instructions  Medication Instructions:   Your physician has recommended you make the following change in your medication:   INCREASE- Amlodipine  10 mg tablet once daily.  *If you need a refill on your cardiac medications before your next appointment, please call your pharmacy*  Lab Work:  NONE  If you have labs (blood work) drawn today and your tests are completely normal, you will receive your results only by: MyChart Message (if you have MyChart) OR A paper copy in the mail If you have any lab test that is abnormal or we need to change your treatment, we will call you to review the results.  Testing/Procedures:  NONE  Follow-Up: At [facility], you and your health needs are our priority.  As part of  our continuing mission to provide you with exceptional heart care, our providers are all part of one team.  This team includes your primary Cardiologist (physician) and Advanced Practice Providers or APPs (Physician Assistants and Nurse Practitioners) who all work together to provide you with the care you need, when you need it.  Your next appointment:   3 month(s)  Provider:   You may see Constancia Delton, MD or one of the following Advanced Practice Providers on your designated Care Team:   Laneta Pintos, NP Gildardo Labrador, PA-C Varney Gentleman, PA-C Cadence Norris, PA-C Ronald Cockayne, NP Morey Ar, NP    We recommend signing up for the patient portal called MyChart.  Sign up information is provided on this After Visit Summary.  MyChart is used to connect with patients for Virtual Visits (Telemedicine).  Patients are able to view lab/test results, encounter notes, upcoming appointments, etc.  Non-urgent messages can be sent to your provider as well.   To learn more about what you can do with MyChart, go to [email or web address].       Signed, Constancia Delton, MD  05/22/2024 11:12 AM     [facility]

## 2024-05-25 ENCOUNTER — Encounter (INDEPENDENT_AMBULATORY_CARE_PROVIDER_SITE_OTHER): Payer: Self-pay | Admitting: Ophthalmology

## 2024-05-25 ENCOUNTER — Ambulatory Visit (INDEPENDENT_AMBULATORY_CARE_PROVIDER_SITE_OTHER): Admitting: Ophthalmology

## 2024-05-25 DIAGNOSIS — H34833 Tributary (branch) retinal vein occlusion, bilateral, with macular edema: Secondary | ICD-10-CM

## 2024-05-25 DIAGNOSIS — H35033 Hypertensive retinopathy, bilateral: Secondary | ICD-10-CM

## 2024-05-25 DIAGNOSIS — H3509 Other intraretinal microvascular abnormalities: Secondary | ICD-10-CM

## 2024-05-25 DIAGNOSIS — H34233 Retinal artery branch occlusion, bilateral: Secondary | ICD-10-CM

## 2024-05-25 DIAGNOSIS — I10 Essential (primary) hypertension: Secondary | ICD-10-CM | POA: Diagnosis not present

## 2024-05-25 DIAGNOSIS — H25813 Combined forms of age-related cataract, bilateral: Secondary | ICD-10-CM

## 2024-05-25 MED ORDER — AFLIBERCEPT 2MG/0.05ML IZ SOLN FOR KALEIDOSCOPE
2.0000 mg | INTRAVITREAL | Status: AC | PRN
  Administered 2024-05-25: 2 mg via INTRAVITREAL

## 2024-06-19 NOTE — Progress Notes (Signed)
 [facility] - Clinic Note  06/22/2024     CHIEF COMPLAINT Patient presents for Retina Follow Up   HISTORY OF PRESENT ILLNESS: Shelley Mitchell is a 83 y.o. female who presents to the clinic today for:   HPI     Retina Follow Up   Patient presents with  CRVO/BRVO.  In both eyes.  This started 2.5 years ago.  Severity is moderate.  Duration of 4 weeks.  Since onset it is stable.  I, the attending physician,  performed the HPI with the patient and updated documentation appropriately.        Comments   Pt states no changes in vision, she has been wearing her previous pair of glasses because they are better with the trifocal vision. Pt states about 2 weeks ago she had a floater in her eye that was going up and down then side to side but it has been less noticeable lately. Pt denies FOL/pain. Pt states she is consistent with Latanoprost  at bedtime OU. Pt states she uses ats 1-2 times per day.      Last edited by Valdemar Rogue, MD on 06/22/2024 12:25 PM.     Pt states  Referring physician: Cleatus Collar, MD [address] [place],  [place] [address]  HISTORICAL INFORMATION:   Selected notes from the MEDICAL RECORD NUMBER Referred by Dr. Cleatus for concern of BDR / PDR LEE:  Ocular Hx- PMH-    CURRENT MEDICATIONS: Current Outpatient Medications (Ophthalmic Drugs)  Medication Sig   latanoprost  (XALATAN ) 0.005 % ophthalmic solution Place 1 drop into both eyes at bedtime.   No current facility-administered medications for this visit. (Ophthalmic Drugs)   Current Outpatient Medications (Other)  Medication Sig   acetaminophen  (TYLENOL ) 500 MG tablet Take 1,000 mg by mouth every 12 (twelve) hours.   albuterol  (VENTOLIN  HFA) 108 (90 Base) MCG/ACT inhaler Inhale 2 puffs into the lungs every 8 (eight) hours as needed for wheezing or shortness of breath.   Amino Acids-Protein Hydrolys (PRO-STAT AWC) LIQD Take 30 mLs by mouth 2 (two) times daily between meals.    amLODipine  (NORVASC ) 10 MG tablet Take 1 tablet (10 mg total) by mouth daily.   aspirin EC 81 MG tablet Take 81 mg by mouth daily. Swallow whole.   furosemide  (LASIX ) 20 MG tablet Take 20 mg by mouth daily.   levothyroxine (SYNTHROID) 25 MCG tablet Take 25 mcg by mouth daily before breakfast.   losartan  (COZAAR ) 100 MG tablet Take 1 tablet (100 mg total) by mouth daily.   lubiprostone  (AMITIZA ) 8 MCG capsule Take 1 capsule (8 mcg total) by mouth daily with breakfast.   medroxyPROGESTERone  (PROVERA ) 10 MG tablet TAKE 1 TABLET BY MOUTH DAILY 10 DAYS PER MONTH   Multiple Vitamins-Minerals (MULTIVITAMIN WITH MINERALS) tablet Take 1 tablet by mouth daily.   nystatin powder Apply 1 Application topically 3 (three) times daily.   omeprazole  (PRILOSEC) 20 MG capsule Take 1 capsule (20 mg total) by mouth daily.   polyethylene glycol powder (GLYCOLAX /MIRALAX ) 17 GM/SCOOP powder Take 17 g by mouth daily.   pravastatin  (PRAVACHOL ) 40 MG tablet Take 40 mg by mouth at bedtime.   sertraline  (ZOLOFT ) 100 MG tablet Take 100 mg by mouth daily.   No current facility-administered medications for this visit. (Other)   REVIEW OF SYSTEMS: ROS   Positive for: Musculoskeletal, Cardiovascular, Eyes, Respiratory Last edited by Elnor Avelina RAMAN, COT on 06/22/2024  9:47 AM.     ALLERGIES Allergies  Allergen  Reactions   Ace Inhibitors Itching   Nsaids Itching   PAST MEDICAL HISTORY Past Medical History:  Diagnosis Date   Endometrial polyp    Dr. Jayne   History of endometrial hyperplasia    HTN (hypertension)    Hx of adenomatous colonic polyps 1999   Hyperlipidemia    Mental disorder    GAD   Obesity    Osteoarthritis    Tachycardia    subsequently with bradycardia (?secondary to meds)   Past Surgical History:  Procedure Laterality Date   BACK SURGERY     bilateral knee replacements  2002/2003   CHOLECYSTECTOMY     COLONOSCOPY  03/2002   Dr. Riesa internal hemorrhoids   COLONOSCOPY   06/21/2011   polyp at IC valve (tubular adenoma)   COLONOSCOPY N/A 07/15/2016   one 8 mm polyp at hepatic flexure (tubular adenoma), on 12 mm polyp at hepatic flexure (tubular adenoma), 3 year surveillance if health permits   COLONOSCOPY N/A 10/10/2019   descending colon diverticulosis, two 4-5 mm polyps at splenic flexure (tubular adenomas). No surveillance due to age.    LUMBAR LAMINECTOMY/DECOMPRESSION MICRODISCECTOMY N/A 04/27/2022   Procedure: Thoracic Eleven-Twelve Decompression;  Surgeon: Gillie Duncans, MD;  Location: [facility] OR;  Service: Neurosurgery;  Laterality: N/A;   POLYPECTOMY  07/15/2016   Procedure: POLYPECTOMY;  Surgeon: Lamar CHRISTELLA Hollingshead, MD;  Location: [facility];  Service: Endoscopy;;  Splenic Flexure polyps x 2 removed via hot snare   POLYPECTOMY  10/10/2019   Procedure: POLYPECTOMY;  Surgeon: Hollingshead Lamar CHRISTELLA, MD;  Location: [facility];  Service: Endoscopy;;   TUBAL LIGATION     FAMILY HISTORY Family History  Problem Relation Age of Onset   Diabetes Brother    Colon polyps Brother    Diabetes Sister    Cancer Father        Likely prostate   Stroke Mother 78   Colon cancer Neg Hx    SOCIAL HISTORY Social History   Tobacco Use   Smoking status: Never   Smokeless tobacco: Never  Vaping Use   Vaping status: Never Used  Substance Use Topics   Alcohol use: No    Alcohol/week: 0.0 standard drinks of alcohol   Drug use: No       OPHTHALMIC EXAM:  Base Eye Exam     Visual Acuity (Snellen - Linear)       Right Left   Dist cc 20/30 -2 20/200 -2   Dist ph cc NI 20/150 -1    Correction: Glasses         Tonometry (Tonopen, 10:04 AM)       Right Left   Pressure 14 8         Pupils       Pupils Dark Light Shape React APD   Right PERRL 4 2 Round Brisk None   Left PERRL 4 2 Round Brisk None         Visual Fields       Left Right    Full Full         Extraocular Movement       Right Left    Full, Ortho Full, Ortho         Neuro/Psych      Oriented x3: Yes   Mood/Affect: Normal         Dilation     Both eyes: 1.0% Mydriacyl, 2.5% Phenylephrine  @ 10:04 AM  Slit Lamp and Fundus Exam     Slit Lamp Exam       Right Left   Lids/Lashes Dermatochalasis - upper lid Dermatochalasis - upper lid   Conjunctiva/Sclera mild melanosis mild melanosis   Cornea arcus arcus   Anterior Chamber deep and clear deep and clear   Iris Round and dilated Round and dilated   Lens 2+ Nuclear sclerosis, 2+ Cortical cataract 2-3+ Nuclear sclerosis, 2-3+ Cortical cataract   Anterior Vitreous Vitreous syneresis, Posterior vitreous detachment, vitreous condensations Vitreous syneresis, Posterior vitreous detachment         Fundus Exam       Right Left   Disc nasal hyperemia-improved, sharp rim, PPA, mild temporal pallor, no heme, disc elevation, but no frank disc edema Pink and Sharp, mild tilt, +PPA, no edema, focal hyperemia superiorly--improved   C/D Ratio 0.3 0.3   Macula Flat, good foveal reflex, inferior macula atrophic, RPE mottling and clumping, subretinal heme and edema superior macula -- improved, punctate exudates superior macula--improved Blunted foveal reflex, persistent central and inferior IRH and exudates, central cystic changes, mild ERM   Vessels attenuated, Tortuous, macroanuerysm with heme along ST arcades -- improving attenuated, Tortuous, retinal macroaneurysms along IT arcades--improved   Periphery Attached, scattered MA / DBH Attached, scattered IRH--improved/essentially resolved, cluster of exudates nasal to disc.           Refraction     Wearing Rx       Sphere Cylinder Axis Add   Right -2.00 +0.75 165 +2.00 Left -1.25 +0.50 148 +2.00 +0.75 165 +2.00   Left -1.25 +0.50 148 +2.00    Age: 97-2 years           IMAGING AND PROCEDURES  Imaging and Procedures for 06/22/2024  OCT, Retina - OU - Both Eyes       Right Eye Quality was good. Central Foveal Thickness: 234. Progression has  improved. Findings include normal foveal contour, no IRF, no SRF, myopic contour, intraretinal hyper-reflective material, inner retinal atrophy, outer retinal atrophy (Interval improvement in IRF/SRF/IRHM (macroanuerysm) along ST arcades, Diffuse IRA inferior macula / hemisphere -- remote BRAO; mild disc edema--slightly improved).   Left Eye Quality was good. Central Foveal Thickness: 466. Progression has been stable. Findings include abnormal foveal contour, myopic contour, retinal drusen , subretinal hyper-reflective material, intraretinal hyper-reflective material, intraretinal fluid, outer retinal atrophy (Persistent disc elevation, ERM with central thickening and ORA; drusen, SRHM, persistent IRF/IRHM superior and temporal macula. ).   Notes *Images captured and stored on drive  Diagnosis / Impression:  OD: Interval improvement in IRF/SRF/IRHM (macroanuerysm) along ST arcades, Diffuse IRA inferior macula / hemisphere -- remote BRAO; mild disc edema--slightly improved and ORA; drusen, SRHM, IRF/IRHM -- improved OS: Persistent disc elevation, ERM with central thickening and ORA; drusen, SRHM, persistent IRF/IRHM superior and temporal macula.   Clinical management:  See below  Abbreviations: NFP - Normal foveal profile. CME - cystoid macular edema. PED - pigment epithelial detachment. IRF - intraretinal fluid. SRF - subretinal fluid. EZ - ellipsoid zone. ERM - epiretinal membrane. ORA - outer retinal atrophy. ORT - outer retinal tubulation. SRHM - subretinal hyper-reflective material. IRHM - intraretinal hyper-reflective material      Intravitreal Injection, Pharmacologic Agent - OD - Right Eye       Time Out 06/22/2024. 11:10 AM. Confirmed correct patient, procedure, site, and patient consented.   Anesthesia Topical anesthesia was used. Anesthetic medications included Lidocaine  2%, Proparacaine 0.5%.   Procedure Preparation included 5% betadine to ocular  surface, eyelid speculum. A  (33g) needle was used.   Injection: 2 mg aflibercept  2 MG/0.05ML   Route: Intravitreal, Site: Right Eye   NDC: Q956576, Lot: [id number], Expiration date: 09/04/2024, Waste: 0 mL   Post-op Post injection exam found visual acuity of at least counting fingers. The patient tolerated the procedure well. There were no complications. The patient received written and verbal post procedure care education. Post injection medications were not given.      Intravitreal Injection, Pharmacologic Agent - OS - Left Eye       Time Out 06/22/2024. 11:10 AM. Confirmed correct patient, procedure, site, and patient consented.   Anesthesia Topical anesthesia was used. Anesthetic medications included Lidocaine  2%, Proparacaine 0.5%.   Procedure Preparation included 5% betadine to ocular surface, eyelid speculum. A (33g) needle was used.   Injection: 2 mg aflibercept  2 MG/0.05ML   Route: Intravitreal, Site: Left Eye   NDC: Q956576, Lot: [id number], Expiration date: 10/04/2025, Waste: 0 mL   Post-op Post injection exam found visual acuity of at least counting fingers. The patient tolerated the procedure well. There were no complications. The patient received written and verbal post procedure care education. Post injection medications were not given.            ASSESSMENT/PLAN:    ICD-10-CM   1. 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 Essential hypertension  I10     4. Hypertensive retinopathy of both eyes  H35.033     5. Combined forms of age-related cataract of both eyes  H25.813     6. 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 BRVO w/ CME and retinal  macronauerysm OS - s/p IVA OS #1 (12.15.23) #2 (01.12.24), #3 (02.09.24), #4 (03.15.24), #5 (04.12.24), #6 (06.07.24) ============================= - s/p IVE OS #1 (05.10.24), #2 (07.08.24) #3(8.9.24), #4 (09.09.24), #5 (10.11.24), #6 (11.15.24), #7 (12.20.24) #8(01.24.25), #9 (02.28.25), #10 (04.02.25) #11(05.20.25), #12 (06.20.25) - pt previously followed with Dr. Lynwood in [place], [place], but now lives in a facility in [place] and cannot be transported to [place] - history of laser photoablation of retinal macroaneurysm OS per chart review - FA 12.15.23 shows focal MAs w/ late leakage - BCVA OS stable at 20/150 - exam shows macroaneurysm with surrounding circinate exudates and edema, inferior macula OS -- improved - OCT shows OS: Persistent disc elevation, ERM with central thickening and ORA; drusen, SRHM, persistent IRF/IRHM superior and temporal macula at 4 weeks - recommend IVE OS #13 today, 07.18.25 w/ f/u in 4 wks - RBA of procedure discussed, questions answered - IVE informed consent obtained and signed, 05.20.25 (OU) - **discussed decreased efficacy / resistance to Eylea  and potential benefit of switching medication*  - see procedure note - f/u 4 weeks-- DFE/OCT/possible injection ** new BRAO superior macula/fovea OS on 01.24.25 visit**  - pt reported 3 day history of central visual field loss w/ extension to inferior paracentral VF  - pt reports recent spikes in SBP >200  - discussed findings, prognosis  - f/u 4 weeks, DFE, OCT, possible injxn  - will check insurance authorization for Vabysmo  1-3. 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 - s/p IVA OD #1 (06.07.24) #2 (07.08.24), #3 (08.09.24), #4 (09.06.24), #5 (10.10.24), #6 (11.15.24) - s/p IVE OD #1 (05.20.25), #2 (06.20.25) - exam shows peripapillary hemorrhage, mild NVD nasal disc -- improved; macroaneurysm w/ surrounding heme and edema along ST arcades -- improved  - FA 12.15.23 shows mild NVD nasal disc w/ leakage  - pt  may benefit from PRP   - BCVA OD 20/30 from 20/40 - OCT shows OD: iInterval improvement in IRF/SRF/IRHM (macroanuerysm) along ST arcades, Diffuse IRA inferior macula / hemisphere -- remote BRAO; mild disc edema--slightly improved and ORA; drusen, SRHM, IRF/IRHM -- improved at 4 wks  - recommend IVE OD #3 today, 07.18.25 w/ f/u in 4 wks  - pt wishes to proceed with injection  - RBA of procedure discussed, questions answered - IVE informed consent obtained and signed, 05.20.25 (OU) - **discussed decreased efficacy / resistance to Eylea  and potential benefit of switching medication* - see procedure note - f/u 4 weeks, DFE, OCT, possible injxn  4,5. Hypertensive retinopathy OU  - pt and son report recent changes in BP meds - discussed importance of tight BP control and likely relationship with BRVOs  6. Mixed Cataract OU - The symptoms of cataract, surgical options, and treatments and risks were discussed with patient. - discussed diagnosis and progression - likely visually significant - under the expert management of Dr. Cleatus  - had appt with Dr. Alena on November 10, 2023 who did not recommend cataract surgery at this time - clear from a retina standpoint to proceed with cataract surgery when pt and surgeon are ready  - would recommend timing cataract surgery ~1-2 wks after anti-VEGF injection  Ophthalmic Meds Ordered this visit:  Meds ordered this encounter  Medications   aflibercept  (EYLEA ) SOLN 2 mg   aflibercept  (EYLEA ) SOLN 2 mg     Return in about 4 weeks (around 07/20/2024) for BRVO OU, DFE, OCT, Possible Injxn.  There are no Patient Instructions on file for this visit.  This document serves as a record of services personally performed by Redell JUDITHANN Hans, MD, PhD. It was created on their behalf by Delon Newness COT, an ophthalmic technician. The creation of this record is the provider's dictation and/or activities during the visit.    Electronically signed by:  Delon Newness COT 07.15.2025  12:26 PM  This document serves as a record of services personally performed by Redell JUDITHANN Hans, MD, PhD. It was created on their behalf by Almetta Pesa, an ophthalmic technician. The creation of this record is the provider's dictation and/or activities during the visit.    Electronically signed by: Almetta Pesa, OA, 06/22/24  12:26 PM  Redell JUDITHANN Hans, M.D., Ph.D. Diseases & Surgery of the Retina and Vitreous [facility] 06/22/2024   I have reviewed the above documentation for accuracy and completeness, and I agree with the above. Redell JUDITHANN Hans, M.D., Ph.D. 06/22/24 12:28 PM    Abbreviations: M myopia (nearsighted); A astigmatism; H hyperopia (farsighted); P presbyopia; Mrx spectacle prescription;  CTL contact lenses; OD right eye; OS left eye; OU both eyes  XT exotropia; ET esotropia; PEK punctate epithelial keratitis; PEE punctate epithelial erosions; DES dry eye syndrome; MGD meibomian gland dysfunction; ATs artificial tears; PFAT's preservative free artificial tears; NSC nuclear sclerotic cataract; PSC posterior subcapsular cataract; ERM epi-retinal membrane; PVD posterior vitreous detachment; RD retinal detachment; DM diabetes mellitus; DR diabetic retinopathy; NPDR non-proliferative diabetic retinopathy; PDR proliferative  diabetic retinopathy; CSME clinically significant macular edema; DME diabetic macular edema; dbh dot blot hemorrhages; CWS cotton wool spot; POAG primary open angle glaucoma; C/D cup-to-disc ratio; HVF humphrey visual field; GVF goldmann visual field; OCT optical coherence tomography; IOP intraocular pressure; BRVO Branch retinal vein occlusion; CRVO central retinal vein occlusion; CRAO central retinal artery occlusion; BRAO branch retinal artery occlusion; RT retinal tear; SB scleral buckle; PPV pars plana vitrectomy; VH Vitreous hemorrhage; PRP panretinal laser photocoagulation; IVK intravitreal kenalog; VMT  vitreomacular traction; MH Macular hole;  NVD neovascularization of the disc; NVE neovascularization elsewhere; AREDS age related eye disease study; ARMD age related macular degeneration; POAG primary open angle glaucoma; EBMD epithelial/anterior basement membrane dystrophy; ACIOL anterior chamber intraocular lens; IOL intraocular lens; PCIOL posterior chamber intraocular lens; Phaco/IOL phacoemulsification with intraocular lens placement; PRK photorefractive keratectomy; LASIK laser assisted in situ keratomileusis; HTN hypertension; DM diabetes mellitus; COPD chronic obstructive pulmonary disease

## 2024-06-22 ENCOUNTER — Encounter (INDEPENDENT_AMBULATORY_CARE_PROVIDER_SITE_OTHER): Payer: Self-pay | Admitting: Ophthalmology

## 2024-06-22 ENCOUNTER — Ambulatory Visit (INDEPENDENT_AMBULATORY_CARE_PROVIDER_SITE_OTHER): Admitting: Ophthalmology

## 2024-06-22 DIAGNOSIS — I10 Essential (primary) hypertension: Secondary | ICD-10-CM | POA: Diagnosis not present

## 2024-06-22 DIAGNOSIS — H3509 Other intraretinal microvascular abnormalities: Secondary | ICD-10-CM | POA: Diagnosis not present

## 2024-06-22 DIAGNOSIS — H35033 Hypertensive retinopathy, bilateral: Secondary | ICD-10-CM

## 2024-06-22 DIAGNOSIS — H34233 Retinal artery branch occlusion, bilateral: Secondary | ICD-10-CM

## 2024-06-22 DIAGNOSIS — H25813 Combined forms of age-related cataract, bilateral: Secondary | ICD-10-CM

## 2024-06-22 DIAGNOSIS — H34833 Tributary (branch) retinal vein occlusion, bilateral, with macular edema: Secondary | ICD-10-CM | POA: Diagnosis not present

## 2024-06-22 MED ORDER — AFLIBERCEPT 2MG/0.05ML IZ SOLN FOR KALEIDOSCOPE
2.0000 mg | INTRAVITREAL | Status: AC | PRN
  Administered 2024-06-22: 2 mg via INTRAVITREAL

## 2024-07-03 ENCOUNTER — Ambulatory Visit: Attending: Cardiology | Admitting: Cardiology

## 2024-07-03 ENCOUNTER — Encounter: Payer: Self-pay | Admitting: Cardiology

## 2024-07-03 VITALS — BP 177/75 | HR 46 | Ht 63.0 in | Wt 279.0 lb

## 2024-07-03 DIAGNOSIS — I10 Essential (primary) hypertension: Secondary | ICD-10-CM

## 2024-07-03 DIAGNOSIS — R001 Bradycardia, unspecified: Secondary | ICD-10-CM | POA: Diagnosis not present

## 2024-07-03 DIAGNOSIS — G4733 Obstructive sleep apnea (adult) (pediatric): Secondary | ICD-10-CM | POA: Diagnosis not present

## 2024-07-03 MED ORDER — HYDRALAZINE HCL 25 MG PO TABS
25.0000 mg | ORAL_TABLET | Freq: Two times a day (BID) | ORAL | 3 refills | Status: DC
Start: 2024-07-03 — End: 2024-10-03

## 2024-07-03 MED ORDER — TIRZEPATIDE-WEIGHT MANAGEMENT 5 MG/0.5ML ~~LOC~~ SOLN: 5.0000 mg | SUBCUTANEOUS | 0 refills | Status: AC

## 2024-07-03 MED ORDER — TIRZEPATIDE-WEIGHT MANAGEMENT 2.5 MG/0.5ML ~~LOC~~ SOLN: 2.5000 mg | SUBCUTANEOUS | 0 refills | Status: AC

## 2024-07-03 MED ORDER — AMLODIPINE BESYLATE 10 MG PO TABS: 10.0000 mg | ORAL_TABLET | Freq: Every day | ORAL | 3 refills | Status: AC

## 2024-07-03 NOTE — Progress Notes (Signed)
 Cardiology Office Note   Date:  07/03/2024  ID:  Vanda, Waskey [birth year], MRN [id number] PCP: Thad Bucco, MD  [facility] Providers Cardiologist:  Redell Cave, MD     History of Present Illness Shelley Mitchell is a 83 y.o. female with past medical history of hypertension, anxiety, chronic back pain, hyperlipidemia, morbid obesity, obstructive sleep apnea on CPAP, who presents today for follow-up.  Prior echocardiogram completed in April 2023 for dyspnea on exertion showed an LVEF of 60 to 65%, G2 DD, normal RV SF.  She was admitted in early 2024 for dizziness with concerns of bradycardia.  Clinidine had previously been stopped.  EKG showed sinus bradycardia first-degree AV block, no high-grade AV block or pauses.  Overnight heart rate was in the upper 30s.  Repeat echocardiogram revealed an LVEF of 60 to 65%, no RWMA, mild LVH, G1 DD, normal pulmonary artery systolic pressure, trivial MR.  Cardiac monitor in 02/2023 showed an average heart rate of 50 bpm, no high-grade AV block was noted.  She was last seen in clinic 05/22/2024 where she was evaluated by Dr.Agbor-Etang.  Blood pressure continued to be elevated.  Previously was on medication 3 times a day when her blood pressure was adequately controlled.  She had recently been referred to weight loss clinic in [place] by pulmonary medicine was waiting for appointment.  Patient was continued on her current medication regimen without changes and no further testing was ordered at that time.  She returns to clinic today accompanied by family member.  She states that she has been doing well from a cardiac perspective.  She denies any chest pain, shortness of breath, lightheadedness dizziness.  She does have occasional headaches and visual disturbances as well as peripheral edema.  Has several questions related to medications and the need and why she is taking some of them.  States that blood pressure continues to be elevated and has  been up and down at the facility.  Denies any hospitalizations or visits to the emergency department.  ROS: 10 point review of system has been reviewed and considered negative the exception was been listed in the HPI  Studies Reviewed     Event Monitor (Zio) 02/28/23 Conclusion Sinus pauses, first-degree AV block, Mobitz 1 AV block noted. Sinus pauses associated with sleep. No significant conduction defects to suggest etiology for syncope. Continue to avoid AV nodal agents.  Event monitor (Zio) 02/24/2023 Conclusion Occasional paroxysmal SVT Average heart rate 51 No evidence of high degree heart block  Echo 12/2022 1. Left ventricular ejection fraction, by estimation, is 60 to 65%. The  left ventricle has normal function. The left ventricle has no regional  wall motion abnormalities. There is mild left ventricular hypertrophy.  Left ventricular diastolic parameters  are consistent with Grade I diastolic dysfunction (impaired relaxation).   2. Right ventricular systolic function is normal. The right ventricular  size is normal. There is normal pulmonary artery systolic pressure. The  estimated right ventricular systolic pressure is 28.4 mmHg.   3. The mitral valve is normal in structure. Trivial mitral valve  regurgitation. No evidence of mitral stenosis.   4. The aortic valve is tricuspid. Aortic valve regurgitation is not  visualized. Aortic valve sclerosis/calcification is present, without any  evidence of aortic stenosis.   5. The inferior vena cava is normal in size with greater than 50%  respiratory variability, suggesting right atrial pressure of 3 mmHg.    Risk Assessment/Calculations     Physical Exam  VS:  BP (!) 177/75   Pulse (!) 46   Ht 5' 3 (1.6 m)   Wt 279 lb (126.6 kg)   SpO2 97%   BMI 49.42 kg/m        Wt Readings from Last 3 Encounters:  07/03/24 279 lb (126.6 kg)  05/22/24 279 lb 8 oz (126.8 kg)  02/20/24 268 lb (121.6 kg)    GEN: Well nourished,  well developed in no acute distress NECK: No JVD; No carotid bruits CARDIAC: RRR, II/VI systolic murmur RUSB, without rubs or gallops RESPIRATORY:  Clear to auscultation without rales, wheezing or rhonchi  ABDOMEN: Soft, non-tender, obese, non-distended EXTREMITIES:  1+ edema BLE; No deformity   ASSESSMENT AND PLAN Primary hypertension with blood pressure today 177/75 she continues to remain elevated today.  She has been continued on amlodipine  10 mg daily, furosemide  20 mg daily, losartan  100 mg daily, and will start hydralazine  25 mg twice daily today as blood pressure is still suboptimally controlled.  We have asked the facility to monitor pressure 1 to 2 hours postmedication administration to determine the effectiveness of the current medications that she is on.  Obstructive sleep apnea which she is compliant with CPAP.  She continues to be followed by [facility] pulmonary.  Asymptomatic bradycardia which she continues to remain bradycardic today with a rate of 46 with held AV nodal blocking agents.  She is asymptomatic and previous cardiac monitor with no high-grade AV block.  No current indication for pacemaker.  Will continue to monitor.  Morbid obesity with a BMI of 49.42.  Complicates prognoses.  Previously was prescribed Ozempic  but was not covered by insurance.  Would benefit from weight loss.  With her history of obstructive sleep apnea she has been prescribed Ozempic  2.5 mg once weekly x 4 weeks and then increase to 5 mg once weekly until return.       Dispo: Patient to return to clinic to see MD/APP in 3 months or sooner if needed for further evaluation  Signed, Shelley Kueker, NP

## 2024-07-03 NOTE — Patient Instructions (Signed)
 Medication Instructions:  Your physician recommends the following medication changes.  START TAKING: Zepbound  2.5 mg weekly x 4, then 5 mg weekly Hydralazine  25 mg two times daily  *If you need a refill on your cardiac medications before your next appointment, please call your pharmacy*  Lab Work: No labs ordered today  If you have labs (blood work) drawn today and your tests are completely normal, you will receive your results only by: MyChart Message (if you have MyChart) OR A paper copy in the mail If you have any lab test that is abnormal or we need to change your treatment, we will call you to review the results.  Testing/Procedures: No test ordered today   Follow-Up: At [facility], you and your health needs are our priority.  As part of our continuing mission to provide you with exceptional heart care, our providers are all part of one team.  This team includes your primary Cardiologist (physician) and Advanced Practice Providers or APPs (Physician Assistants and Nurse Practitioners) who all work together to provide you with the care you need, when you need it.  Your next appointment:   3 month(s)  Provider:   Redell Cave, MD or Tylene Lunch, NP

## 2024-07-04 ENCOUNTER — Other Ambulatory Visit (HOSPITAL_COMMUNITY): Payer: Self-pay

## 2024-07-04 ENCOUNTER — Telehealth: Payer: Self-pay | Admitting: Pharmacy Technician

## 2024-07-04 NOTE — Telephone Encounter (Signed)
 From staff messages  Pharmacy Patient Advocate Encounter   Received notification from staff that prior authorization for zepbound  2.5mg  is required/requested.   Insurance verification completed.   The patient is insured through [employer] [place] .   Per test claim: PA required; PA submitted to above mentioned insurance via latent Key/confirmation #/EOC [id number] Status is pending

## 2024-07-04 NOTE — Telephone Encounter (Signed)
 Pharmacy Patient Advocate Encounter  Received notification from [employer] that Prior Authorization for zepbound  2.5mg  has been APPROVED from 07/04/24 to 12/05/24   PA #/Case ID/Reference #: [id number]

## 2024-07-06 NOTE — Progress Notes (Signed)
 [facility] - Clinic Note  07/20/2024     CHIEF COMPLAINT Patient presents for Retina Follow Up   HISTORY OF PRESENT ILLNESS: Shelley Mitchell is a 83 y.o. female who presents to the clinic today for:   HPI     Retina Follow Up   Patient presents with  CRVO/BRVO.  In both eyes.  This started 2.5 years ago.  Severity is moderate.  Duration of 4 weeks.  Since onset it is stable.  I, the attending physician,  performed the HPI with the patient and updated documentation appropriately.        Comments   Pt states no changes in vision. Pt states she was having several floaters in left eye but now it is down to just one. Pt denies FOL. Pt will have a brief pain in the right eye occasionally. Pt states she is consistent with Latanoprost  at bedtime OU. Pt states she uses ats 1-2 times per day.      Last edited by Valdemar Rogue, MD on 07/20/2024 12:32 PM.    Pt states she is taking Zepbound , concerned about eye side effects.   Referring physician: Cleatus Collar, MD [address] [place],  [place] [address]  HISTORICAL INFORMATION:   Selected notes from the MEDICAL RECORD NUMBER Referred by Dr. Cleatus for concern of BDR / PDR LEE:  Ocular Hx- PMH-    CURRENT MEDICATIONS: Current Outpatient Medications (Ophthalmic Drugs)  Medication Sig   latanoprost  (XALATAN ) 0.005 % ophthalmic solution Place 1 drop into both eyes at bedtime.   No current facility-administered medications for this visit. (Ophthalmic Drugs)   Current Outpatient Medications (Other)  Medication Sig   acetaminophen  (TYLENOL ) 500 MG tablet Take 1,000 mg by mouth every 12 (twelve) hours.   albuterol  (VENTOLIN  HFA) 108 (90 Base) MCG/ACT inhaler Inhale 2 puffs into the lungs every 8 (eight) hours as needed for wheezing or shortness of breath.   Amino Acids-Protein Hydrolys (PRO-STAT AWC) LIQD Take 30 mLs by mouth 2 (two) times daily between meals.   amLODipine  (NORVASC ) 10 MG tablet Take 1  tablet (10 mg total) by mouth daily.   aspirin EC 81 MG tablet Take 81 mg by mouth daily. Swallow whole.   furosemide  (LASIX ) 20 MG tablet Take 20 mg by mouth daily.   hydrALAZINE  (APRESOLINE ) 25 MG tablet Take 1 tablet (25 mg total) by mouth 2 (two) times daily.   levothyroxine (SYNTHROID) 25 MCG tablet Take 25 mcg by mouth daily before breakfast.   losartan  (COZAAR ) 100 MG tablet Take 1 tablet (100 mg total) by mouth daily.   lubiprostone  (AMITIZA ) 8 MCG capsule Take 1 capsule (8 mcg total) by mouth daily with breakfast.   medroxyPROGESTERone  (PROVERA ) 10 MG tablet TAKE 1 TABLET BY MOUTH DAILY 10 DAYS PER MONTH   Multiple Vitamins-Minerals (MULTIVITAMIN WITH MINERALS) tablet Take 1 tablet by mouth daily.   nystatin powder Apply 1 Application topically 3 (three) times daily. (Patient not taking: Reported on 07/03/2024)   omeprazole  (PRILOSEC) 20 MG capsule Take 1 capsule (20 mg total) by mouth daily.   polyethylene glycol powder (GLYCOLAX /MIRALAX ) 17 GM/SCOOP powder Take 17 g by mouth daily.   pravastatin  (PRAVACHOL ) 40 MG tablet Take 40 mg by mouth at bedtime.   sertraline  (ZOLOFT ) 100 MG tablet Take 100 mg by mouth daily.   tirzepatide  (ZEPBOUND ) 2.5 MG/0.5ML injection vial Inject 2.5 mg into the skin once a week for 4 doses.   [START ON 07/25/2024] tirzepatide  5 MG/0.5ML  injection vial Inject 5 mg into the skin once a week for 4 doses.   No current facility-administered medications for this visit. (Other)   REVIEW OF SYSTEMS: ROS   Positive for: Musculoskeletal, Cardiovascular, Eyes, Respiratory Last edited by Elnor Avelina RAMAN, COT on 07/20/2024  9:40 AM.      ALLERGIES Allergies  Allergen Reactions   Ace Inhibitors Itching and Hives   Nsaids Itching   PAST MEDICAL HISTORY Past Medical History:  Diagnosis Date   Endometrial polyp    Dr. Jayne   History of endometrial hyperplasia    HTN (hypertension)    Hx of adenomatous colonic polyps 1999   Hyperlipidemia    Mental disorder     GAD   Obesity    OSA on CPAP    follows with [facility]   Osteoarthritis    Tachycardia    subsequently with bradycardia (?secondary to meds)   Past Surgical History:  Procedure Laterality Date   BACK SURGERY     bilateral knee replacements  2002/2003   CHOLECYSTECTOMY     COLONOSCOPY  03/2002   Dr. Riesa internal hemorrhoids   COLONOSCOPY  06/21/2011   polyp at IC valve (tubular adenoma)   COLONOSCOPY N/A 07/15/2016   one 8 mm polyp at hepatic flexure (tubular adenoma), on 12 mm polyp at hepatic flexure (tubular adenoma), 3 year surveillance if health permits   COLONOSCOPY N/A 10/10/2019   descending colon diverticulosis, two 4-5 mm polyps at splenic flexure (tubular adenomas). No surveillance due to age.    LUMBAR LAMINECTOMY/DECOMPRESSION MICRODISCECTOMY N/A 04/27/2022   Procedure: Thoracic Eleven-Twelve Decompression;  Surgeon: Gillie Duncans, MD;  Location: [facility] OR;  Service: Neurosurgery;  Laterality: N/A;   POLYPECTOMY  07/15/2016   Procedure: POLYPECTOMY;  Surgeon: Lamar CHRISTELLA Hollingshead, MD;  Location: [facility]ITE;  Service: Endoscopy;;  Splenic Flexure polyps x 2 removed via hot snare   POLYPECTOMY  10/10/2019   Procedure: POLYPECTOMY;  Surgeon: Hollingshead Lamar CHRISTELLA, MD;  Location: [facility];  Service: Endoscopy;;   TUBAL LIGATION     FAMILY HISTORY Family History  Problem Relation Age of Onset   Diabetes Brother    Colon polyps Brother    Diabetes Sister    Cancer Father        Likely prostate   Stroke Mother 88   Colon cancer Neg Hx    SOCIAL HISTORY Social History   Tobacco Use   Smoking status: Never   Smokeless tobacco: Never  Vaping Use   Vaping status: Never Used  Substance Use Topics   Alcohol use: No    Alcohol/week: 0.0 standard drinks of alcohol   Drug use: No       OPHTHALMIC EXAM:  Base Eye Exam     Visual Acuity (Snellen - Linear)       Right Left   Dist cc 20/60 -2 20/250 -2   Dist ph cc 20/30 -2 20/150 -2    Correction: Glasses          Tonometry (Tonopen, 9:37 AM)       Right Left   Pressure 14 13         Pupils       Pupils Dark Light Shape React APD   Right PERRL 3 2 Round Brisk None   Left PERRL 3 2 Round Brisk None         Visual Fields       Left Right    Full Full  Extraocular Movement       Right Left    Full, Ortho Full, Ortho         Neuro/Psych     Oriented x3: Yes   Mood/Affect: Normal         Dilation     Both eyes: 1.0% Mydriacyl, 2.5% Phenylephrine  @ 9:37 AM           Slit Lamp and Fundus Exam     Slit Lamp Exam       Right Left   Lids/Lashes Dermatochalasis - upper lid Dermatochalasis - upper lid   Conjunctiva/Sclera mild melanosis mild melanosis   Cornea arcus arcus   Anterior Chamber deep and clear deep and clear   Iris Round and dilated Round and dilated   Lens 2+ Nuclear sclerosis, 2+ Cortical cataract 2-3+ Nuclear sclerosis, 2-3+ Cortical cataract   Anterior Vitreous Vitreous syneresis, Posterior vitreous detachment, vitreous condensations Vitreous syneresis, Posterior vitreous detachment         Fundus Exam       Right Left   Disc nasal hyperemia-improved, sharp rim, PPA, mild temporal pallor, no heme, disc elevation, but no frank disc edema Pink and Sharp, mild tilt, +PPA, no edema, focal hyperemia superiorly--improved   C/D Ratio 0.3 0.3   Macula Flat, good foveal reflex, inferior macula atrophic, RPE mottling and clumping, subretinal heme and edema superior macula -- improved, punctate exudates superior macula--improved Blunted foveal reflex, persistent central and inferior IRH and exudates, central cystic changes, mild ERM   Vessels attenuated, Tortuous, macroanuerysm with heme along ST arcades -- improving attenuated, Tortuous, retinal macroaneurysms along IT arcades--improved   Periphery Attached, scattered MA / DBH Attached, scattered IRH--improved/essentially resolved, cluster of exudates nasal to disc.            Refraction     Wearing Rx       Sphere Cylinder Axis Add   Right -2.00 +0.75 165 +2.00 Left -1.25 +0.50 148 +2.00 +0.75 165 +2.00   Left -1.25 +0.50 148 +2.00           IMAGING AND PROCEDURES  Imaging and Procedures for 07/20/2024  OCT, Retina - OU - Both Eyes       Right Eye Quality was good. Central Foveal Thickness: 235. Progression has been stable. Findings include normal foveal contour, no IRF, no SRF, myopic contour, intraretinal hyper-reflective material, inner retinal atrophy, outer retinal atrophy (Stable improvement in IRF/SRF/IRHM (macroanuerysm) along ST arcades, Diffuse IRA inferior macula / hemisphere -- remote BRAO; mild disc edema).   Left Eye Quality was good. Central Foveal Thickness: 466. Progression has been stable. Findings include abnormal foveal contour, myopic contour, retinal drusen , subretinal hyper-reflective material, intraretinal hyper-reflective material, intraretinal fluid, outer retinal atrophy (Persistent disc elevation, ERM with central thickening and ORA; drusen, SRHM, persistent IRF/IRHM superior and temporal macula. ).   Notes *Images captured and stored on drive  Diagnosis / Impression:  OD: Stable improvement in IRF/SRF/IRHM (macroanuerysm) along ST arcades, Diffuse IRA inferior macula / hemisphere -- remote BRAO; mild disc edema OS: Persistent disc elevation, ERM with central thickening and ORA; drusen, SRHM, persistent IRF/IRHM superior and temporal macula.   Clinical management:  See below  Abbreviations: NFP - Normal foveal profile. CME - cystoid macular edema. PED - pigment epithelial detachment. IRF - intraretinal fluid. SRF - subretinal fluid. EZ - ellipsoid zone. ERM - epiretinal membrane. ORA - outer retinal atrophy. ORT - outer retinal tubulation. SRHM - subretinal hyper-reflective material. IRHM - intraretinal hyper-reflective material  Intravitreal Injection, Pharmacologic Agent - OD - Right Eye       Time  Out 07/20/2024. 10:25 AM. Confirmed correct patient, procedure, site, and patient consented.   Anesthesia Topical anesthesia was used. Anesthetic medications included Lidocaine  2%, Proparacaine 0.5%.   Procedure Preparation included 5% betadine to ocular surface, eyelid speculum. A (33g) needle was used.   Injection: 2 mg aflibercept  2 MG/0.05ML   Route: Intravitreal, Site: Right Eye   NDC: Q956576, Lot: [id number], Expiration date: 10/05/2025, Waste: 0 mL   Post-op Post injection exam found visual acuity of at least counting fingers. The patient tolerated the procedure well. There were no complications. The patient received written and verbal post procedure care education. Post injection medications were not given.      Intravitreal Injection, Pharmacologic Agent - OS - Left Eye       Time Out 07/20/2024. 10:26 AM. Confirmed correct patient, procedure, site, and patient consented.   Anesthesia Topical anesthesia was used. Anesthetic medications included Lidocaine  2%, Proparacaine 0.5%.   Procedure Preparation included 5% betadine to ocular surface, eyelid speculum. A supplied (33g) needle was used.   Injection: 6 mg faricimab -svoa 6 MG/0.05ML   Route: Intravitreal, Site: Left Eye   NDC: 49757-903-93, Lot: [id number], Expiration date: 04/04/2025, Waste: 0 mL   Post-op Post injection exam found visual acuity of at least counting fingers. The patient tolerated the procedure well. There were no complications. The patient received written and verbal post procedure care education. Post injection medications were not given.            ASSESSMENT/PLAN:    ICD-10-CM   1. 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 Essential hypertension  I10     4. Hypertensive retinopathy of both eyes  H35.033     5. Combined forms of age-related cataract of both eyes  H25.813      1-2. BRVO w/ CME and retinal macronauerysm OS - s/p IVA OS #1 (12.15.23) #2 (01.12.24), #3 (02.09.24), #4 (03.15.24), #5 (04.12.24), #6 (06.07.24) ============================= - s/p IVE OS #1 (05.10.24), #2 (07.08.24) #3(8.9.24), #4 (09.09.24), #5 (10.11.24), #6 (11.15.24), #7 (12.20.24) #8(01.24.25), #9 (02.28.25), #10 (04.02.25) #11(05.20.25), #12 (06.20.25), #13 (07.18.25) - pt previously followed with Dr. Lynwood in [place], [place], but now lives in a facility in [place] and cannot be transported to [place] - history of laser photoablation of retinal macroaneurysm OS per chart review - FA 12.15.23 shows focal MAs w/ late leakage - BCVA OS stable at 20/150 - exam shows macroaneurysm with surrounding circinate exudates and edema, inferior macula OS -- improved - OCT shows OS: Persistent disc elevation, ERM with central thickening and ORA; drusen, SRHM, persistent IRF/IRHM superior and temporal macula at 4 weeks **discussed decreased efficacy / resistance to Eylea  and potential benefit of switching medication**  - recommend switching to IVV OS #1 (08.15.25) , today w/ f/u in 4 wks - RBA of procedure discussed, questions answered - IVE informed consent obtained and signed, 05.20.25 (OU) - IVV informed consent obtained and signed, 08.15.25 (OU) - see procedure note - f/u 4 weeks-- DFE/OCT/possible injection ** new BRAO superior macula/fovea OS on  01.24.25 visit**  - pt reported 3 day history of central visual field loss w/ extension to inferior paracentral VF  - pt reports recent spikes in SBP >200  - discussed findings, prognosis  - f/u 4 weeks, DFE, OCT, possible injection  **Pt approved for Eylea  and Vabysmo  paid 100%**  1-2. 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 -  s/p IVA OD #1 (06.07.24) #2 (07.08.24), #3 (08.09.24), #4 (09.06.24), #5 (10.10.24), #6 (11.15.24) - s/p IVE OD #1 (05.20.25), #2 (06.20.25), #3 (07.18.25)  - exam shows peripapillary hemorrhage, mild NVD nasal disc -- improved; macroaneurysm w/ surrounding heme and edema along ST arcades -- improved  - FA 12.15.23 shows mild NVD nasal disc w/ leakage  - pt may benefit from PRP   - BCVA OD 20/30 from 20/40 - OCT shows OD: Stable improvement in IRF/SRF/IRHM (macroanuerysm) along ST arcades, Diffuse IRA inferior macula / hemisphere -- remote BRAO; mild disc edemaimproved at 4 wks  - recommend IVE OD #4 (08.15.25), today w/ f/u in 4 wks  - pt wishes to proceed with injection  - RBA of procedure discussed, questions answered - IVE informed consent obtained and signed, 05.20.25 (OU) - see procedure note - f/u 4 weeks, DFE, OCT, possible injection  3,4. Hypertensive retinopathy OU  - pt and son report recent changes in BP meds - discussed importance of tight BP control and likely relationship with BRVOs  5. Mixed Cataract OU - The symptoms of cataract, surgical options, and treatments and risks were discussed with patient. - discussed diagnosis and progression - likely visually significant - under the expert management of Dr. Cleatus  - had appt with Dr. Alena on November 10, 2023 who did not recommend cataract surgery at this time - clear from a retina standpoint to proceed with cataract surgery when pt and surgeon are ready  - would recommend timing cataract surgery ~1-2 wks after anti-VEGF injection  Ophthalmic Meds Ordered this visit:  Meds ordered this encounter  Medications   faricimab -svoa (VABYSMO ) 6mg /0.50mL intravitreal injection   aflibercept  (EYLEA ) SOLN 2 mg     Return in about 4 weeks (around 08/17/2024) for BRVO OU, Dilated Exam, OCT, Possible Injxn.  There are no Patient Instructions on file for this visit.  This document serves as a record of services personally  performed by Redell JUDITHANN Hans, MD, PhD. It was created on their behalf by Avelina Pereyra, COA an ophthalmic technician. The creation of this record is the provider's dictation and/or activities during the visit.   Electronically signed by: Avelina GORMAN Pereyra, COT  07/20/24  12:33 PM   Redell JUDITHANN Hans, M.D., Ph.D. Diseases & Surgery of the Retina and Vitreous [facility] 07/20/2024  I have reviewed the above documentation for accuracy and completeness, and I agree with the above. Redell JUDITHANN Hans, M.D., Ph.D. 07/20/24 12:38 PM   Abbreviations: M myopia (nearsighted); A astigmatism; H hyperopia (farsighted); P presbyopia; Mrx spectacle prescription;  CTL contact lenses; OD right eye; OS left eye; OU both eyes  XT exotropia; ET esotropia; PEK punctate epithelial keratitis; PEE punctate epithelial erosions; DES dry eye syndrome; MGD meibomian gland dysfunction; ATs artificial tears; PFAT's preservative free artificial tears; NSC nuclear sclerotic cataract; PSC posterior subcapsular cataract; ERM epi-retinal membrane; PVD posterior vitreous detachment; RD retinal detachment; DM diabetes mellitus; DR diabetic retinopathy; NPDR non-proliferative diabetic retinopathy; PDR proliferative diabetic retinopathy; CSME clinically significant macular edema; DME diabetic macular edema; dbh dot blot hemorrhages; CWS cotton wool spot; POAG primary  open angle glaucoma; C/D cup-to-disc ratio; HVF humphrey visual field; GVF goldmann visual field; OCT optical coherence tomography; IOP intraocular pressure; BRVO Branch retinal vein occlusion; CRVO central retinal vein occlusion; CRAO central retinal artery occlusion; BRAO branch retinal artery occlusion; RT retinal tear; SB scleral buckle; PPV pars plana vitrectomy; VH Vitreous hemorrhage; PRP panretinal laser photocoagulation; IVK intravitreal kenalog; VMT vitreomacular traction; MH Macular hole;  NVD neovascularization of the disc; NVE neovascularization  elsewhere; AREDS age related eye disease study; ARMD age related macular degeneration; POAG primary open angle glaucoma; EBMD epithelial/anterior basement membrane dystrophy; ACIOL anterior chamber intraocular lens; IOL intraocular lens; PCIOL posterior chamber intraocular lens; Phaco/IOL phacoemulsification with intraocular lens placement; PRK photorefractive keratectomy; LASIK laser assisted in situ keratomileusis; HTN hypertension; DM diabetes mellitus; COPD chronic obstructive pulmonary disease

## 2024-07-10 NOTE — Progress Notes (Signed)
 This encounter was created in error - please disregard.

## 2024-07-20 ENCOUNTER — Ambulatory Visit (INDEPENDENT_AMBULATORY_CARE_PROVIDER_SITE_OTHER): Admitting: Ophthalmology

## 2024-07-20 ENCOUNTER — Encounter (INDEPENDENT_AMBULATORY_CARE_PROVIDER_SITE_OTHER): Payer: Self-pay | Admitting: Ophthalmology

## 2024-07-20 DIAGNOSIS — H3509 Other intraretinal microvascular abnormalities: Secondary | ICD-10-CM

## 2024-07-20 DIAGNOSIS — H35033 Hypertensive retinopathy, bilateral: Secondary | ICD-10-CM

## 2024-07-20 DIAGNOSIS — I10 Essential (primary) hypertension: Secondary | ICD-10-CM | POA: Diagnosis not present

## 2024-07-20 DIAGNOSIS — H34833 Tributary (branch) retinal vein occlusion, bilateral, with macular edema: Secondary | ICD-10-CM

## 2024-07-20 DIAGNOSIS — H25813 Combined forms of age-related cataract, bilateral: Secondary | ICD-10-CM

## 2024-07-20 MED ORDER — AFLIBERCEPT 2MG/0.05ML IZ SOLN FOR KALEIDOSCOPE
2.0000 mg | INTRAVITREAL | Status: AC | PRN
Start: 2024-07-20 — End: 2024-07-20
  Administered 2024-07-20: 2 mg via INTRAVITREAL

## 2024-07-20 MED ORDER — FARICIMAB-SVOA 6 MG/0.05ML IZ SOSY
6.0000 mg | PREFILLED_SYRINGE | INTRAVITREAL | Status: AC | PRN
  Administered 2024-07-20: 6 mg via INTRAVITREAL

## 2024-08-15 NOTE — Progress Notes (Signed)
 [facility] - Clinic Note  08/17/2024     CHIEF COMPLAINT Patient presents for Retina Follow Up   HISTORY OF PRESENT ILLNESS: Shelley Mitchell is a 83 y.o. female who presents to the clinic today for:   HPI     Retina Follow Up   Patient presents with  CRVO/BRVO.  In both eyes.  This started 4 weeks ago.  I, the attending physician,  performed the HPI with the patient and updated documentation appropriately.        Comments   Patient here for 4 weeks retina follow up for BRVO OU. Patient states vision about the same. When wears old glasses OS hurts. Lights are bothersome. Using drops for dry eyes.       Last edited by Valdemar Rogue, MD on 08/17/2024 12:28 PM.     Pt states the eyes were hurting when using old glasses. She is unsure if the vision has changed. She is using AT's.  Referring physician: Cleatus Collar, MD [address] [place],  [place] [address]  HISTORICAL INFORMATION:   Selected notes from the MEDICAL RECORD NUMBER Referred by Dr. Cleatus for concern of BDR / PDR LEE:  Ocular Hx- PMH-    CURRENT MEDICATIONS: Current Outpatient Medications (Ophthalmic Drugs)  Medication Sig   latanoprost  (XALATAN ) 0.005 % ophthalmic solution Place 1 drop into both eyes at bedtime.   No current facility-administered medications for this visit. (Ophthalmic Drugs)   Current Outpatient Medications (Other)  Medication Sig   acetaminophen  (TYLENOL ) 500 MG tablet Take 1,000 mg by mouth every 12 (twelve) hours.   albuterol  (VENTOLIN  HFA) 108 (90 Base) MCG/ACT inhaler Inhale 2 puffs into the lungs every 8 (eight) hours as needed for wheezing or shortness of breath.   Amino Acids-Protein Hydrolys (PRO-STAT AWC) LIQD Take 30 mLs by mouth 2 (two) times daily between meals.   amLODipine  (NORVASC ) 10 MG tablet Take 1 tablet (10 mg total) by mouth daily.   aspirin EC 81 MG tablet Take 81 mg by mouth daily. Swallow whole.   furosemide  (LASIX ) 20 MG tablet  Take 20 mg by mouth daily.   hydrALAZINE  (APRESOLINE ) 25 MG tablet Take 1 tablet (25 mg total) by mouth 2 (two) times daily.   levothyroxine (SYNTHROID) 25 MCG tablet Take 25 mcg by mouth daily before breakfast.   losartan  (COZAAR ) 100 MG tablet Take 1 tablet (100 mg total) by mouth daily.   lubiprostone  (AMITIZA ) 8 MCG capsule Take 1 capsule (8 mcg total) by mouth daily with breakfast.   medroxyPROGESTERone  (PROVERA ) 10 MG tablet TAKE 1 TABLET BY MOUTH DAILY 10 DAYS PER MONTH   Multiple Vitamins-Minerals (MULTIVITAMIN WITH MINERALS) tablet Take 1 tablet by mouth daily.   omeprazole  (PRILOSEC) 20 MG capsule Take 1 capsule (20 mg total) by mouth daily.   polyethylene glycol powder (GLYCOLAX /MIRALAX ) 17 GM/SCOOP powder Take 17 g by mouth daily.   pravastatin  (PRAVACHOL ) 40 MG tablet Take 40 mg by mouth at bedtime.   sertraline  (ZOLOFT ) 100 MG tablet Take 100 mg by mouth daily.   nystatin powder Apply 1 Application topically 3 (three) times daily. (Patient not taking: Reported on 08/17/2024)   No current facility-administered medications for this visit. (Other)   REVIEW OF SYSTEMS: ROS   Positive for: Musculoskeletal, Cardiovascular, Eyes, Respiratory Last edited by Orval Asberry RAMAN, COA on 08/17/2024  9:42 AM.       ALLERGIES Allergies  Allergen Reactions   Ace Inhibitors Itching and Hives   Nsaids  Itching   PAST MEDICAL HISTORY Past Medical History:  Diagnosis Date   Endometrial polyp    Dr. Jayne   History of endometrial hyperplasia    HTN (hypertension)    Hx of adenomatous colonic polyps 1999   Hyperlipidemia    Mental disorder    GAD   Obesity    OSA on CPAP    follows with [facility]   Osteoarthritis    Tachycardia    subsequently with bradycardia (?secondary to meds)   Past Surgical History:  Procedure Laterality Date   BACK SURGERY     bilateral knee replacements  2002/2003   CHOLECYSTECTOMY     COLONOSCOPY  03/2002   Dr. Riesa internal hemorrhoids    COLONOSCOPY  06/21/2011   polyp at IC valve (tubular adenoma)   COLONOSCOPY N/A 07/15/2016   one 8 mm polyp at hepatic flexure (tubular adenoma), on 12 mm polyp at hepatic flexure (tubular adenoma), 3 year surveillance if health permits   COLONOSCOPY N/A 10/10/2019   descending colon diverticulosis, two 4-5 mm polyps at splenic flexure (tubular adenomas). No surveillance due to age.    LUMBAR LAMINECTOMY/DECOMPRESSION MICRODISCECTOMY N/A 04/27/2022   Procedure: Thoracic Eleven-Twelve Decompression;  Surgeon: Gillie Duncans, MD;  Location[facility];  Service: Neurosurgery;  Laterality: N/A;   POLYPECTOMY  07/15/2016   Procedure: POLYPECTOMY;  Surgeon: Lamar CHRISTELLA Hollingshead, MD;  Location: [facility] SUITE;  Service: Endoscopy;;  Splenic Flexure polyps x 2 removed via hot snare   POLYPECTOMY  10/10/2019   Procedure: POLYPECTOMY;  Surgeon: Hollingshead Lamar CHRISTELLA, MD;  Location: [facility];  Service: Endoscopy;;   TUBAL LIGATION     FAMILY HISTORY Family History  Problem Relation Age of Onset   Diabetes Brother    Colon polyps Brother    Diabetes Sister    Cancer Father        Likely prostate   Stroke Mother 49   Colon cancer Neg Hx    SOCIAL HISTORY Social History   Tobacco Use   Smoking status: Never   Smokeless tobacco: Never  Vaping Use   Vaping status: Never Used  Substance Use Topics   Alcohol use: No    Alcohol/week: 0.0 standard drinks of alcohol   Drug use: No       OPHTHALMIC EXAM:  Base Eye Exam     Visual Acuity (Snellen - Linear)       Right Left   Dist cc 20/50 -1 20/200 -2   Dist ph cc 20/30 +2 20/150    Correction: Glasses         Tonometry (Tonopen, 9:40 AM)       Right Left   Pressure 13 12         Pupils       Dark Light Shape React APD   Right 3 2 Round Brisk None   Left 3 2 Round Brisk None         Visual Fields (Counting fingers)       Left Right    Full Full         Extraocular Movement       Right Left    Full, Ortho Full, Ortho          Neuro/Psych     Oriented x3: Yes   Mood/Affect: Normal         Dilation     Both eyes: 1.0% Mydriacyl, 2.5% Phenylephrine  @ 9:40 AM  Slit Lamp and Fundus Exam     Slit Lamp Exam       Right Left   Lids/Lashes Dermatochalasis - upper lid Dermatochalasis - upper lid   Conjunctiva/Sclera mild melanosis mild melanosis   Cornea arcus arcus   Anterior Chamber deep and clear deep and clear   Iris Round and dilated Round and dilated   Lens 2+ Nuclear sclerosis, 2+ Cortical cataract 2-3+ Nuclear sclerosis, 2-3+ Cortical cataract   Anterior Vitreous Vitreous syneresis, Posterior vitreous detachment, vitreous condensations Vitreous syneresis, Posterior vitreous detachment         Fundus Exam       Right Left   Disc nasal hyperemia-improved, sharp rim, PPA, mild temporal pallor, no heme, disc elevation, but no frank disc edema Pink and Sharp, mild tilt, +PPA, no edema, focal hyperemia superiorly--improved   C/D Ratio 0.4 0.3   Macula Flat, good foveal reflex, inferior macula atrophic, RPE mottling and clumping, subretinal heme and edema superior macula -- improved, punctate exudates superior macula--stably improved Blunted foveal reflex, persistent central and inferior IRH and exudates, central cystic changes slight improved, mild ERM   Vessels attenuated, Tortuous, macroanuerysm with heme along ST arcades -- improving attenuated, Tortuous, retinal macroaneurysms along IT arcades--improved   Periphery Attached, scattered MA / DBH Attached, scattered IRH--improved/essentially resolved, cluster of exudates nasal to disc.           Refraction     Wearing Rx       Sphere Cylinder Axis Add   Right -2.00 +0.75 165 +2.00 Left -1.25 +0.50 148 +2.00 +0.75 165 +2.00   Left -1.25 +0.50 148 +2.00           IMAGING AND PROCEDURES  Imaging and Procedures for 08/17/2024  OCT, Retina - OU - Both Eyes       Right Eye Quality was good. Central Foveal Thickness:  226. Progression has been stable. Findings include normal foveal contour, no IRF, no SRF, myopic contour, intraretinal hyper-reflective material, inner retinal atrophy, outer retinal atrophy (Stable improvement in IRF/SRF/IRHM (macroanuerysm) along ST arcades, Diffuse IRA inferior macula / hemisphere -- remote BRAO; mild disc edema).   Left Eye Quality was good. Central Foveal Thickness: 455. Progression has improved. Findings include abnormal foveal contour, myopic contour, retinal drusen , subretinal hyper-reflective material, intraretinal hyper-reflective material, intraretinal fluid, outer retinal atrophy (Persistent disc elevation, ERM with central thickening and ORA; drusen, SRHM, persistent IRF/IRHM superior and temporal macula-- slightly improved. ).   Notes *Images captured and stored on drive  Diagnosis / Impression:  OD: Stable improvement in IRF/SRF/IRHM (macroanuerysm) along ST arcades, Diffuse IRA inferior macula / hemisphere -- remote BRAO; mild disc edema OS: Persistent disc elevation, ERM with central thickening and ORA; drusen, SRHM, persistent IRF/IRHM superior and temporal macula-- slightly improved.      Clinical management:  See below  Abbreviations: NFP - Normal foveal profile. CME - cystoid macular edema. PED - pigment epithelial detachment. IRF - intraretinal fluid. SRF - subretinal fluid. EZ - ellipsoid zone. ERM - epiretinal membrane. ORA - outer retinal atrophy. ORT - outer retinal tubulation. SRHM - subretinal hyper-reflective material. IRHM - intraretinal hyper-reflective material      Intravitreal Injection, Pharmacologic Agent - OS - Left Eye       Time Out 08/17/2024. 10:19 AM. Confirmed correct patient, procedure, site, and patient consented.   Anesthesia Topical anesthesia was used. Anesthetic medications included Lidocaine  2%, Proparacaine 0.5%.   Procedure Preparation included 5% betadine to ocular surface, eyelid speculum. A supplied (  33g) needle was  used.   Injection: 6 mg faricimab -svoa 6 MG/0.05ML   Route: Intravitreal, Site: Left Eye   NDC: 49757-903-93, Lot: [id number], Expiration date: 04/04/2025, Waste: 0 mL   Post-op Post injection exam found visual acuity of at least counting fingers. The patient tolerated the procedure well. There were no complications. The patient received written and verbal post procedure care education. Post injection medications were not given.            ASSESSMENT/PLAN:    ICD-10-CM   1. 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 Essential hypertension  I10     4. Hypertensive retinopathy of both eyes  H35.033     5. Combined forms of age-related cataract of both eyes  H25.813       1-2. BRVO w/ CME and retinal macronauerysm OS - s/p IVA OS #1 (12.15.23) #2 (01.12.24), #3 (02.09.24), #4 (03.15.24), #5 (04.12.24), #6 (06.07.24) ============================= - s/p IVE OS #1 (05.10.24), #2 (07.08.24) #3 (08.9.24), #4 (09.09.24), #5 (10.11.24), #6 (11.15.24), #7 (12.20.24) #8 (01.24.25), #9 (02.28.25), #10 (04.02.25) #11(05.20.25), #12 (06.20.25), #13 (07.18.25) ============================= - s/p IVV OS #1 (08.15.25)  - pt previously followed with Dr. Lynwood in [place], [place], but now lives in a facility in [place] and cannot be transported to [facility] - history of laser photoablation of retinal macroaneurysm OS per chart review - FA 12.15.23 shows focal MAs w/ late leakage - BCVA OS stable at 20/150 - exam shows macroaneurysm with surrounding circinate exudates and edema, inferior macula OS -- improved - OCT shows OS: Persistent disc elevation, ERM with central thickening and ORA; drusen, SRHM, persistent IRF/IRHM superior and temporal macula at 4 weeks - recommend IVV OS  #2 (09.12.25), today w/ f/u in 4 wks - RBA of procedure discussed, questions answered - IVE informed consent obtained and signed, 05.20.25 (OU) - IVV informed consent obtained and signed, 08.15.25 (OU) - see procedure note - f/u 4 weeks-- DFE/OCT/possible injection ** new BRAO superior macula/fovea OS on 01.24.25 visit** - pt reported 3 day history of central visual field loss w/ extension to inferior paracentral VF  - pt reports recent spikes in SBP >200  - discussed findings, prognosis  - f/u 4 weeks, DFE, OCT, possible injection  **Pt approved for Eylea  and Vabysmo  paid 100%**  1-2. 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 - s/p IVA OD #1 (06.07.24) #2 (07.08.24), #3 (08.09.24), #4 (09.06.24), #5 (10.10.24), #6 (11.15.24) - s/p IVE OD #1 (05.20.25), #2 (06.20.25), #3 (07.18.25), #4 (08.15.25) - exam shows peripapillary hemorrhage, mild NVD nasal disc -- improved; macroaneurysm w/ surrounding heme and edema along ST arcades -- improved  - FA 12.15.23 shows mild NVD nasal disc w/ leakage  - pt may benefit from PRP   - BCVA OD 20/30 from 20/40 - OCT shows OD: Stable improvement in IRF/SRF/IRHM (macroanuerysm) along ST arcades, Diffuse IRA inferior macula / hemisphere -- remote BRAO; mild disc edemaimproved at 4 wks  - recommend holding IVE OD today (09.12.25) w/ f/u in 4 wks - IVE informed consent obtained and signed, 05.20.25 (OU) - f/u 4 weeks, DFE, OCT, possible injection  3,4. Hypertensive retinopathy OU  - pt and son report recent changes in BP meds - discussed importance of  tight BP control and likely relationship with BRVOs  5. Mixed Cataract OU - The symptoms of cataract, surgical options, and treatments and risks were discussed with patient. - discussed diagnosis and progression - likely visually significant - under the expert management of Dr. Cleatus  - had appt with Dr. Alena on November 10, 2023 who did not recommend cataract surgery at this time - clear from a  retina standpoint to proceed with cataract surgery when pt and surgeon are ready  - would recommend timing cataract surgery ~1-2 wks after anti-VEGF injection  Ophthalmic Meds Ordered this visit:  Meds ordered this encounter  Medications   faricimab -svoa (VABYSMO ) 6mg /0.49mL intravitreal injection     Return in about 4 weeks (around 09/14/2024) for f/u, BRAO, DFE, OCT, Possible, IVE, OU.  There are no Patient Instructions on file for this visit.  This document serves as a record of services personally performed by Redell JUDITHANN Hans, MD, PhD. It was created on their behalf by Delon Newness COT, an ophthalmic technician. The creation of this record is the provider's dictation and/or activities during the visit.    Electronically signed by: Delon Newness COT 09.10.2025 8:15 PM  This document serves as a record of services personally performed by Redell JUDITHANN Hans, MD, PhD. It was created on their behalf by Wanda GEANNIE Keens, COT an ophthalmic technician. The creation of this record is the provider's dictation and/or activities during the visit.    Electronically signed by:  Wanda GEANNIE Keens, COT  08/24/24 8:15 PM  Redell JUDITHANN Hans, M.D., Ph.D. Diseases & Surgery of the Retina and Vitreous [facility] 08/17/2024    I have reviewed the above documentation for accuracy and completeness, and I agree with the above. Redell JUDITHANN Hans, M.D., Ph.D. 08/24/24 8:28 PM   Abbreviations: M myopia (nearsighted); A astigmatism; H hyperopia (farsighted); P presbyopia; Mrx spectacle prescription;  CTL contact lenses; OD right eye; OS left eye; OU both eyes  XT exotropia; ET esotropia; PEK punctate epithelial keratitis; PEE punctate epithelial erosions; DES dry eye syndrome; MGD meibomian gland dysfunction; ATs artificial tears; PFAT's preservative free artificial tears; NSC nuclear sclerotic cataract; PSC posterior subcapsular cataract; ERM epi-retinal membrane; PVD  posterior vitreous detachment; RD retinal detachment; DM diabetes mellitus; DR diabetic retinopathy; NPDR non-proliferative diabetic retinopathy; PDR proliferative diabetic retinopathy; CSME clinically significant macular edema; DME diabetic macular edema; dbh dot blot hemorrhages; CWS cotton wool spot; POAG primary open angle glaucoma; C/D cup-to-disc ratio; HVF humphrey visual field; GVF goldmann visual field; OCT optical coherence tomography; IOP intraocular pressure; BRVO Branch retinal vein occlusion; CRVO central retinal vein occlusion; CRAO central retinal artery occlusion; BRAO branch retinal artery occlusion; RT retinal tear; SB scleral buckle; PPV pars plana vitrectomy; VH Vitreous hemorrhage; PRP panretinal laser photocoagulation; IVK intravitreal kenalog; VMT vitreomacular traction; MH Macular hole;  NVD neovascularization of the disc; NVE neovascularization elsewhere; AREDS age related eye disease study; ARMD age related macular degeneration; POAG primary open angle glaucoma; EBMD epithelial/anterior basement membrane dystrophy; ACIOL anterior chamber intraocular lens; IOL intraocular lens; PCIOL posterior chamber intraocular lens; Phaco/IOL phacoemulsification with intraocular lens placement; PRK photorefractive keratectomy; LASIK laser assisted in situ keratomileusis; HTN hypertension; DM diabetes mellitus; COPD chronic obstructive pulmonary disease

## 2024-08-17 ENCOUNTER — Encounter (INDEPENDENT_AMBULATORY_CARE_PROVIDER_SITE_OTHER): Payer: Self-pay | Admitting: Ophthalmology

## 2024-08-17 ENCOUNTER — Ambulatory Visit (INDEPENDENT_AMBULATORY_CARE_PROVIDER_SITE_OTHER): Admitting: Ophthalmology

## 2024-08-17 DIAGNOSIS — I10 Essential (primary) hypertension: Secondary | ICD-10-CM | POA: Diagnosis not present

## 2024-08-17 DIAGNOSIS — H3509 Other intraretinal microvascular abnormalities: Secondary | ICD-10-CM | POA: Diagnosis not present

## 2024-08-17 DIAGNOSIS — H25813 Combined forms of age-related cataract, bilateral: Secondary | ICD-10-CM

## 2024-08-17 DIAGNOSIS — H35033 Hypertensive retinopathy, bilateral: Secondary | ICD-10-CM | POA: Diagnosis not present

## 2024-08-17 DIAGNOSIS — H34833 Tributary (branch) retinal vein occlusion, bilateral, with macular edema: Secondary | ICD-10-CM | POA: Diagnosis not present

## 2024-08-17 MED ORDER — FARICIMAB-SVOA 6 MG/0.05ML IZ SOSY
6.0000 mg | PREFILLED_SYRINGE | INTRAVITREAL | Status: AC | PRN
  Administered 2024-08-17: 6 mg via INTRAVITREAL

## 2024-08-24 ENCOUNTER — Other Ambulatory Visit: Payer: Self-pay | Admitting: Internal Medicine

## 2024-08-24 DIAGNOSIS — Z1231 Encounter for screening mammogram for malignant neoplasm of breast: Secondary | ICD-10-CM

## 2024-09-04 ENCOUNTER — Telehealth: Payer: Self-pay | Admitting: Cardiology

## 2024-09-04 MED ORDER — TIRZEPATIDE-WEIGHT MANAGEMENT 5 MG/0.5ML ~~LOC~~ SOLN: 5.0000 mg | SUBCUTANEOUS | 0 refills | Status: AC

## 2024-09-04 NOTE — Telephone Encounter (Signed)
 [facility] regarding medication Zepbound . Patient has completed her 2.5 mg zepbound  and will now continue on the 5 mg Zepbound . New prescription placed. No further needs at this time.

## 2024-09-04 NOTE — Telephone Encounter (Signed)
 Pt c/o medication issue:  1. Name of Medication:   tirzepatide  (ZEPBOUND ) 2.5 MG/0.5ML injection vial   2. How are you currently taking this medication (dosage and times per day)?   3. Are you having a reaction (difficulty breathing--STAT)?   4. What is your medication issue?   Caller Quentin) stated patient's medication has expired and wants to know if patient should still be taking this medication.  Caller stated patient is completely out of the medication and would need new prescription sent to [facility], [place] - [phone] [place].

## 2024-09-05 ENCOUNTER — Ambulatory Visit
Admission: RE | Admit: 2024-09-05 | Discharge: 2024-09-05 | Disposition: A | Discharge status: Home / Self Care | Source: Ambulatory Visit | Attending: Internal Medicine | Admitting: Internal Medicine

## 2024-09-05 DIAGNOSIS — Z1231 Encounter for screening mammogram for malignant neoplasm of breast: Secondary | ICD-10-CM

## 2024-09-06 NOTE — Progress Notes (Signed)
 [facility] - Clinic Note  09/17/2024     CHIEF COMPLAINT Patient presents for Retina Follow Up   HISTORY OF PRESENT ILLNESS: Shelley Mitchell is a 83 y.o. female who presents to the clinic today for:   HPI     Retina Follow Up   Patient presents with  CRVO/BRVO.  In both eyes.  This started 4 weeks ago.  I, the attending physician,  performed the HPI with the patient and updated documentation appropriately.        Comments   Patient states vision about the same but yesterday evening she noticed some dusty spider webs in the left eye. Pt states she was straining at the time and did not notice any FOL before hand. Pt states the webs went away shen she went to bed. Pt has had a couple of bp spikes in the past month. Pt is consistent with Travaprost at bedtime OU.      Last edited by Valdemar Rogue, MD on 09/20/2024  1:18 AM.      Referring physician: Cleatus Collar, MD [address] [place],  [place] [address]  HISTORICAL INFORMATION:   Selected notes from the MEDICAL RECORD NUMBER Referred by Dr. Cleatus for concern of BDR / PDR LEE:  Ocular Hx- PMH-    CURRENT MEDICATIONS: Current Outpatient Medications (Ophthalmic Drugs)  Medication Sig   latanoprost  (XALATAN ) 0.005 % ophthalmic solution Place 1 drop into both eyes at bedtime.   No current facility-administered medications for this visit. (Ophthalmic Drugs)   Current Outpatient Medications (Other)  Medication Sig   acetaminophen  (TYLENOL ) 500 MG tablet Take 1,000 mg by mouth every 12 (twelve) hours.   albuterol  (VENTOLIN  HFA) 108 (90 Base) MCG/ACT inhaler Inhale 2 puffs into the lungs every 8 (eight) hours as needed for wheezing or shortness of breath.   Amino Acids-Protein Hydrolys (PRO-STAT AWC) LIQD Take 30 mLs by mouth 2 (two) times daily between meals.   amLODipine  (NORVASC ) 10 MG tablet Take 1 tablet (10 mg total) by mouth daily.   aspirin EC 81 MG tablet Take 81 mg by mouth daily.  Swallow whole.   furosemide  (LASIX ) 20 MG tablet Take 20 mg by mouth daily.   hydrALAZINE  (APRESOLINE ) 25 MG tablet Take 1 tablet (25 mg total) by mouth 2 (two) times daily.   levothyroxine (SYNTHROID) 25 MCG tablet Take 25 mcg by mouth daily before breakfast.   losartan  (COZAAR ) 100 MG tablet Take 1 tablet (100 mg total) by mouth daily.   lubiprostone  (AMITIZA ) 8 MCG capsule Take 1 capsule (8 mcg total) by mouth daily with breakfast.   medroxyPROGESTERone  (PROVERA ) 10 MG tablet TAKE 1 TABLET BY MOUTH DAILY 10 DAYS PER MONTH   Multiple Vitamins-Minerals (MULTIVITAMIN WITH MINERALS) tablet Take 1 tablet by mouth daily.   nystatin powder Apply 1 Application topically 3 (three) times daily. (Patient not taking: Reported on 08/17/2024)   omeprazole  (PRILOSEC) 20 MG capsule Take 1 capsule (20 mg total) by mouth daily.   polyethylene glycol powder (GLYCOLAX /MIRALAX ) 17 GM/SCOOP powder Take 17 g by mouth daily.   pravastatin  (PRAVACHOL ) 40 MG tablet Take 40 mg by mouth at bedtime.   sertraline  (ZOLOFT ) 100 MG tablet Take 100 mg by mouth daily.   tirzepatide  5 MG/0.5ML injection vial Inject 5 mg into the skin once a week.   No current facility-administered medications for this visit. (Other)   REVIEW OF SYSTEMS: ROS   Positive for: Musculoskeletal, Cardiovascular, Eyes, Respiratory Last edited by Elnor,  Avelina RAMAN, COT on 09/17/2024 12:58 PM.     ALLERGIES Allergies  Allergen Reactions   Ace Inhibitors Itching and Hives   Nsaids Itching   PAST MEDICAL HISTORY Past Medical History:  Diagnosis Date   Endometrial polyp    Dr. Jayne   History of endometrial hyperplasia    HTN (hypertension)    Hx of adenomatous colonic polyps 1999   Hyperlipidemia    Mental disorder    GAD   Obesity    OSA on CPAP    follows with [facility]   Osteoarthritis    Tachycardia    subsequently with bradycardia (?secondary to meds)   Past Surgical History:  Procedure Laterality Date   BACK SURGERY      bilateral knee replacements  2002/2003   CHOLECYSTECTOMY     COLONOSCOPY  03/2002   Dr. Riesa internal hemorrhoids   COLONOSCOPY  06/21/2011   polyp at IC valve (tubular adenoma)   COLONOSCOPY N/A 07/15/2016   one 8 mm polyp at hepatic flexure (tubular adenoma), on 12 mm polyp at hepatic flexure (tubular adenoma), 3 year surveillance if health permits   COLONOSCOPY N/A 10/10/2019   descending colon diverticulosis, two 4-5 mm polyps at splenic flexure (tubular adenomas). No surveillance due to age.    LUMBAR LAMINECTOMY/DECOMPRESSION MICRODISCECTOMY N/A 04/27/2022   Procedure: Thoracic Eleven-Twelve Decompression;  Surgeon: Gillie Duncans, MD;  Location: [facility] OR;  Service: Neurosurgery;  Laterality: N/A;   POLYPECTOMY  07/15/2016   Procedure: POLYPECTOMY;  Surgeon: Lamar CHRISTELLA Hollingshead, MD;  Location: [facility] SUITE;  Service: Endoscopy;;  Splenic Flexure polyps x 2 removed via hot snare   POLYPECTOMY  10/10/2019   Procedure: POLYPECTOMY;  Surgeon: Hollingshead Lamar CHRISTELLA, MD;  Location: [facility];  Service: Endoscopy;;   TUBAL LIGATION     FAMILY HISTORY Family History  Problem Relation Age of Onset   Diabetes Brother    Colon polyps Brother    Diabetes Sister    Cancer Father        Likely prostate   Stroke Mother 64   Colon cancer Neg Hx    SOCIAL HISTORY Social History   Tobacco Use   Smoking status: Never   Smokeless tobacco: Never  Vaping Use   Vaping status: Never Used  Substance Use Topics   Alcohol use: No    Alcohol/week: 0.0 standard drinks of alcohol   Drug use: No       OPHTHALMIC EXAM:  Base Eye Exam     Visual Acuity (Snellen - Linear)       Right Left   Dist [place] 20/80 -2 20/250 -3   Dist ph [place] 20/30 -2 NI    Correction: Glasses         Tonometry (Tonopen, 1:04 PM)       Right Left   Pressure 16 13         Pupils       Pupils Dark Light Shape React APD   Right PERRL 4 3 Round Brisk None   Left PERRL 4 3 Round Brisk None         Visual  Fields       Left Right    Full Full         Extraocular Movement       Right Left    Full, Ortho Full, Ortho         Neuro/Psych     Oriented x3: Yes   Mood/Affect: Normal  Dilation     Both eyes: 1.0% Mydriacyl, 2.5% Phenylephrine  @ 1:06 PM           Slit Lamp and Fundus Exam     Slit Lamp Exam       Right Left   Lids/Lashes Dermatochalasis - upper lid Dermatochalasis - upper lid   Conjunctiva/Sclera mild melanosis mild melanosis   Cornea arcus arcus   Anterior Chamber deep and clear deep and clear   Iris Round and dilated Round and dilated   Lens 2+ Nuclear sclerosis, 2+ Cortical cataract 2-3+ Nuclear sclerosis, 2-3+ Cortical cataract   Anterior Vitreous Vitreous syneresis, Posterior vitreous detachment, vitreous condensations Vitreous syneresis, Posterior vitreous detachment         Fundus Exam       Right Left   Disc nasal hyperemia-improved, sharp rim, PPA, mild temporal pallor, no heme, disc elevation, but no frank disc edema Pink and Sharp, mild tilt, +PPA, no edema, focal hyperemia superiorly--improved   C/D Ratio 0.4 0.3   Macula Flat, good foveal reflex, inferior macula atrophic, RPE mottling and clumping, subretinal heme and edema superior macula -- stably improved, punctate exudates superior macula--stably improved Blunted foveal reflex, persistent central and inferior IRH and exudates, central cystic changes, new intra and pre retinal heme nasal mac mild ERM   Vessels attenuated, Tortuous, macroanuerysm with heme along ST arcades -- improving attenuated, Tortuous, retinal macroaneurysms along IT arcades--improved   Periphery Attached, scattered MA / DBH Attached, scattered IRH--improved/essentially resolved, cluster of exudates nasal to disc.           Refraction     Wearing Rx       Sphere Cylinder Axis Add   Right -2.00 +0.75 165 +2.00   Left -1.25 +0.50 148 +2.00           IMAGING AND PROCEDURES  Imaging and  Procedures for 09/17/2024  OCT, Retina - OU - Both Eyes       Right Eye Quality was good. Central Foveal Thickness: 226. Progression has been stable. Findings include normal foveal contour, no IRF, no SRF, myopic contour, intraretinal hyper-reflective material, inner retinal atrophy, outer retinal atrophy (Stable improvement in IRF/SRF/IRHM (macroanuerysm) along ST arcades, Diffuse IRA inferior macula / hemisphere -- remote BRAO; mild disc edema).   Left Eye Quality was good. Central Foveal Thickness: 546. Progression has worsened. Findings include abnormal foveal contour, myopic contour, retinal drusen , subretinal hyper-reflective material, intraretinal hyper-reflective material, intraretinal fluid, outer retinal atrophy (Persistent disc elevation, ERM with central thickening and ORA; drusen, SRHM, persistent IRF/IRHM superior and temporal macula; interval development of prominent IRHM nasal mac).   Notes *Images captured and stored on drive  Diagnosis / Impression:  OD: Stable improvement in IRF/SRF/IRHM (macroanuerysm) along ST arcades, Diffuse IRA inferior macula / hemisphere -- remote BRAO; mild disc edema OS: Persistent disc elevation, ERM with central thickening and ORA; drusen, SRHM, persistent IRF/IRHM superior and temporal macula; interval development of prominent IRHM nasal mac      Clinical management:  See below  Abbreviations: NFP - Normal foveal profile. CME - cystoid macular edema. PED - pigment epithelial detachment. IRF - intraretinal fluid. SRF - subretinal fluid. EZ - ellipsoid zone. ERM - epiretinal membrane. ORA - outer retinal atrophy. ORT - outer retinal tubulation. SRHM - subretinal hyper-reflective material. IRHM - intraretinal hyper-reflective material      Intravitreal Injection, Pharmacologic Agent - OS - Left Eye       Time Out 09/17/2024. 1:33 PM. Confirmed correct patient, procedure,  site, and patient consented.   Anesthesia Topical anesthesia was  used. Anesthetic medications included Lidocaine  2%, Proparacaine 0.5%.   Procedure Preparation included 5% betadine to ocular surface, eyelid speculum. A supplied (33g) needle was used.   Injection: 6 mg faricimab -svoa 6 MG/0.05ML   Route: Intravitreal, Site: Left Eye   NDC: 49757-903-93, Lot: [id number], Expiration date: 12/05/2025, Waste: 0 mL   Post-op Post injection exam found visual acuity of at least counting fingers. The patient tolerated the procedure well. There were no complications. The patient received written and verbal post procedure care education. Post injection medications were not given.            ASSESSMENT/PLAN:    ICD-10-CM   1. 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 Essential hypertension  I10     4. Hypertensive retinopathy of both eyes  H35.033     5. Combined forms of age-related cataract of both eyes  H25.813      1-2. BRVO w/ CME and retinal macronauerysm OS - s/p IVA OS #1 (12.15.23) #2 (01.12.24), #3 (02.09.24), #4 (03.15.24), #5 (04.12.24), #6 (06.07.24) ============================= - s/p IVE OS #1 (05.10.24), #2 (07.08.24) #3 (08.9.24), #4 (09.09.24), #5 (10.11.24), #6 (11.15.24), #7 (12.20.24) #8 (01.24.25), #9 (02.28.25), #10 (04.02.25) #11(05.20.25), #12 (06.20.25), #13 (07.18.25) ============================= - s/p IVV OS #1 (08.15.25), #2 (09.12.25) - pt previously followed with Dr. Lynwood in [place], [place], but now lives in a facility in [place] and cannot be transported to [facility] - history of laser photoablation of retinal macroaneurysm OS per chart review - FA 12.15.23 shows focal MAs w/ late leakage - BCVA OS 20/250 from 20/150 - exam shows macroaneurysm with surrounding circinate exudates and edema,  inferior macula OS -- improved; but new IRH / macroaneurysm nasal mac - OCT shows OS: Persistent disc elevation, ERM with central thickening and ORA; drusen, SRHM, persistent IRF/IRHM superior and temporal macula; interval development of prominent IRHM nasal mac  at 4 weeks - recommend IVV OS #3 (10.13.25), today w/ f/u in 4 wks - RBA of procedure discussed, questions answered - IVE informed consent obtained and signed, 05.20.25 (OU) - IVV informed consent obtained and signed, 08.15.25 (OU) - see procedure note - f/u 4 weeks-- DFE/OCT/possible injection **new BRAO superior macula/fovea OS on 01.24.25 visit** - pt reported 3 day history of central visual field loss w/ extension to inferior paracentral VF  - pt reports recent spikes in SBP >200  - discussed findings, prognosis  - f/u 4 weeks, DFE, OCT, possible injection  **Pt approved for Eylea  and Vabysmo  paid 100%**  1-2. 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 - s/p IVA OD #1 (06.07.24) #2 (07.08.24), #3 (08.09.24), #4 (09.06.24), #5 (10.10.24), #6 (11.15.24) ============== - s/p IVE OD #1 (05.20.25), #2 (06.20.25), #3 (07.18.25), #4 (08.15.25) - exam shows peripapillary hemorrhage, mild NVD nasal disc -- improved; macroaneurysm w/ surrounding heme and edema along ST arcades -- improved  - FA 12.15.23 shows mild NVD nasal disc w/ leakage  - pt may benefit from PRP   - BCVA OD 20/30 - stable - OCT shows OD: Stable improvement in IRF/SRF/IRHM (macroanuerysm) along ST arcades, Diffuse IRA inferior macula / hemisphere -- remote BRAO; mild disc edemaimproved at 4 wks  -  recommend holding IVE today OD (10.13.25) w/ f/u in 4 wks - IVE informed consent obtained and signed, 05.20.25 (OU) - f/u 4 weeks, DFE, OCT, possible injection  3,4. Hypertensive retinopathy OU  - pt and son report recent changes in BP meds - discussed importance of tight BP control and likely relationship with BRVOs  5. Mixed Cataract OU - The symptoms of cataract,  surgical options, and treatments and risks were discussed with patient. - discussed diagnosis and progression - likely visually significant - under the expert management of Dr. Cleatus  - had appt with Dr. Alena on November 10, 2023 who did not recommend cataract surgery at this time - clear from a retina standpoint to proceed with cataract surgery when pt and surgeon are ready  - would recommend timing cataract surgery ~1-2 wks after anti-VEGF injection  Ophthalmic Meds Ordered this visit:  Meds ordered this encounter  Medications   faricimab -svoa (VABYSMO ) 6mg /0.82mL intravitreal injection     Return in about 4 weeks (around 10/15/2024) for f/u, BRVO, DFE, OCT, Possible, IVE, OD, IVV, OS.  There are no Patient Instructions on file for this visit.  This document serves as a record of services personally performed by Redell JUDITHANN Hans, MD, PhD. It was created on their behalf by Avelina Pereyra, COA an ophthalmic technician. The creation of this record is the provider's dictation and/or activities during the visit.   Electronically signed by: Avelina GORMAN Pereyra, COT  09/20/24  1:23 AM   This document serves as a record of services personally performed by Redell JUDITHANN Hans, MD, PhD. It was created on their behalf by Wanda GEANNIE Keens, COT an ophthalmic technician. The creation of this record is the provider's dictation and/or activities during the visit.    Electronically signed by:  Wanda GEANNIE Keens, COT  09/20/24 1:23 AM  Redell JUDITHANN Hans, M.D., Ph.D. Diseases & Surgery of the Retina and Vitreous [facility]  I have reviewed the above documentation for accuracy and completeness, and I agree with the above. Redell JUDITHANN Hans, M.D., Ph.D. 09/20/24 1:27 AM    Abbreviations: M myopia (nearsighted); A astigmatism; H hyperopia (farsighted); P presbyopia; Mrx spectacle prescription;  CTL contact lenses; OD right eye; OS left eye; OU both eyes  XT exotropia; ET esotropia;  PEK punctate epithelial keratitis; PEE punctate epithelial erosions; DES dry eye syndrome; MGD meibomian gland dysfunction; ATs artificial tears; PFAT's preservative free artificial tears; NSC nuclear sclerotic cataract; PSC posterior subcapsular cataract; ERM epi-retinal membrane; PVD posterior vitreous detachment; RD retinal detachment; DM diabetes mellitus; DR diabetic retinopathy; NPDR non-proliferative diabetic retinopathy; PDR proliferative diabetic retinopathy; CSME clinically significant macular edema; DME diabetic macular edema; dbh dot blot hemorrhages; CWS cotton wool spot; POAG primary open angle glaucoma; C/D cup-to-disc ratio; HVF humphrey visual field; GVF goldmann visual field; OCT optical coherence tomography; IOP intraocular pressure; BRVO Branch retinal vein occlusion; CRVO central retinal vein occlusion; CRAO central retinal artery occlusion; BRAO branch retinal artery occlusion; RT retinal tear; SB scleral buckle; PPV pars plana vitrectomy; VH Vitreous hemorrhage; PRP panretinal laser photocoagulation; IVK intravitreal kenalog; VMT vitreomacular traction; MH Macular hole;  NVD neovascularization of the disc; NVE neovascularization elsewhere; AREDS age related eye disease study; ARMD age related macular degeneration; POAG primary open angle glaucoma; EBMD epithelial/anterior basement membrane dystrophy; ACIOL anterior chamber intraocular lens; IOL intraocular lens; PCIOL posterior chamber intraocular lens; Phaco/IOL phacoemulsification with intraocular lens placement; PRK photorefractive keratectomy; LASIK laser assisted in situ keratomileusis; HTN hypertension; DM diabetes mellitus;  COPD chronic obstructive pulmonary disease

## 2024-09-14 ENCOUNTER — Encounter (INDEPENDENT_AMBULATORY_CARE_PROVIDER_SITE_OTHER): Admitting: Ophthalmology

## 2024-09-17 ENCOUNTER — Encounter (INDEPENDENT_AMBULATORY_CARE_PROVIDER_SITE_OTHER): Payer: Self-pay | Admitting: Ophthalmology

## 2024-09-17 ENCOUNTER — Ambulatory Visit (INDEPENDENT_AMBULATORY_CARE_PROVIDER_SITE_OTHER): Admitting: Ophthalmology

## 2024-09-17 DIAGNOSIS — I10 Essential (primary) hypertension: Secondary | ICD-10-CM | POA: Diagnosis not present

## 2024-09-17 DIAGNOSIS — H34833 Tributary (branch) retinal vein occlusion, bilateral, with macular edema: Secondary | ICD-10-CM | POA: Diagnosis not present

## 2024-09-17 DIAGNOSIS — H35033 Hypertensive retinopathy, bilateral: Secondary | ICD-10-CM | POA: Diagnosis not present

## 2024-09-17 DIAGNOSIS — H3509 Other intraretinal microvascular abnormalities: Secondary | ICD-10-CM

## 2024-09-17 DIAGNOSIS — H25813 Combined forms of age-related cataract, bilateral: Secondary | ICD-10-CM

## 2024-09-20 ENCOUNTER — Encounter (INDEPENDENT_AMBULATORY_CARE_PROVIDER_SITE_OTHER): Payer: Self-pay | Admitting: Ophthalmology

## 2024-09-20 MED ORDER — FARICIMAB-SVOA 6 MG/0.05ML IZ SOSY
6.0000 mg | PREFILLED_SYRINGE | INTRAVITREAL | Status: AC | PRN
  Administered 2024-09-20: 6 mg via INTRAVITREAL

## 2024-10-01 NOTE — Progress Notes (Signed)
 [facility] - Clinic Note  10/15/2024     CHIEF COMPLAINT Patient presents for Retina Follow Up   HISTORY OF PRESENT ILLNESS: Shelley Mitchell is a 83 y.o. female who presents to the clinic today for:   HPI     Retina Follow Up   Patient presents with  CRVO/BRVO.  In both eyes.  This started 4 weeks ago.  Duration of 4 weeks.  Since onset it is stable.  I, the attending physician,  performed the HPI with the patient and updated documentation appropriately.        Comments   4 week retina retina follow up BRVO OU and I'VE OD and IVV OS pt is reporting no vision changes noticed she denies flashes has some floaters       Last edited by Valdemar Rogue, MD on 10/15/2024 11:36 AM.    Patient states that she is unable to see well out of the left eye. Patient states her BP has been elevated, like 190's. The physician has raised her medication dosage.   Referring physician: Cleatus Collar, MD [address] [place],  [place] [address]  HISTORICAL INFORMATION:   Selected notes from the MEDICAL RECORD NUMBER Referred by Dr. Cleatus for concern of BDR / PDR LEE:  Ocular Hx- PMH-    CURRENT MEDICATIONS: Current Outpatient Medications (Ophthalmic Drugs)  Medication Sig   latanoprost  (XALATAN ) 0.005 % ophthalmic solution Place 1 drop into both eyes at bedtime.   No current facility-administered medications for this visit. (Ophthalmic Drugs)   Current Outpatient Medications (Other)  Medication Sig   acetaminophen  (TYLENOL ) 500 MG tablet Take 1,000 mg by mouth every 12 (twelve) hours.   albuterol  (VENTOLIN  HFA) 108 (90 Base) MCG/ACT inhaler Inhale 2 puffs into the lungs every 8 (eight) hours as needed for wheezing or shortness of breath.   Amino Acids-Protein Hydrolys (PRO-STAT AWC) LIQD Take 30 mLs by mouth 2 (two) times daily between meals.   amLODipine  (NORVASC ) 10 MG tablet Take 1 tablet (10 mg total) by mouth daily.   aspirin EC 81 MG tablet Take 81 mg  by mouth daily. Swallow whole.   furosemide  (LASIX ) 20 MG tablet Take 20 mg by mouth daily.   hydrALAZINE  (APRESOLINE ) 50 MG tablet Take 1 tablet (50 mg total) by mouth 2 (two) times daily.   levothyroxine (SYNTHROID) 25 MCG tablet Take 25 mcg by mouth daily before breakfast.   losartan  (COZAAR ) 100 MG tablet Take 1 tablet (100 mg total) by mouth daily.   lubiprostone  (AMITIZA ) 8 MCG capsule Take 1 capsule (8 mcg total) by mouth daily with breakfast.   medroxyPROGESTERone  (PROVERA ) 10 MG tablet TAKE 1 TABLET BY MOUTH DAILY 10 DAYS PER MONTH   Multiple Vitamins-Minerals (MULTIVITAMIN WITH MINERALS) tablet Take 1 tablet by mouth daily.   nystatin powder Apply 1 Application topically 3 (three) times daily.   omeprazole  (PRILOSEC) 20 MG capsule Take 1 capsule (20 mg total) by mouth daily.   polyethylene glycol powder (GLYCOLAX /MIRALAX ) 17 GM/SCOOP powder Take 17 g by mouth daily.   pravastatin  (PRAVACHOL ) 40 MG tablet Take 40 mg by mouth at bedtime.   sertraline  (ZOLOFT ) 100 MG tablet Take 100 mg by mouth daily.   tirzepatide  5 MG/0.5ML injection vial Inject 5 mg into the skin once a week.   No current facility-administered medications for this visit. (Other)   REVIEW OF SYSTEMS: ROS   Positive for: Musculoskeletal, Cardiovascular, Eyes, Respiratory Last edited by Resa Delon ORN, COT on  10/15/2024  9:42 AM.      ALLERGIES Allergies  Allergen Reactions   Ace Inhibitors Itching and Hives   Nsaids Itching   PAST MEDICAL HISTORY Past Medical History:  Diagnosis Date   Endometrial polyp    Dr. Jayne   History of endometrial hyperplasia    HTN (hypertension)    Hx of adenomatous colonic polyps 1999   Hyperlipidemia    Mental disorder    GAD   Obesity    OSA on CPAP    follows with [facility]   Osteoarthritis    Tachycardia    subsequently with bradycardia (?secondary to meds)   Past Surgical History:  Procedure Laterality Date   BACK SURGERY     bilateral knee  replacements  2002/2003   CHOLECYSTECTOMY     COLONOSCOPY  03/2002   Dr. Riesa internal hemorrhoids   COLONOSCOPY  06/21/2011   polyp at IC valve (tubular adenoma)   COLONOSCOPY N/A 07/15/2016   one 8 mm polyp at hepatic flexure (tubular adenoma), on 12 mm polyp at hepatic flexure (tubular adenoma), 3 year surveillance if health permits   COLONOSCOPY N/A 10/10/2019   descending colon diverticulosis, two 4-5 mm polyps at splenic flexure (tubular adenomas). No surveillance due to age.    LUMBAR LAMINECTOMY/DECOMPRESSION MICRODISCECTOMY N/A 04/27/2022   Procedure: Thoracic Eleven-Twelve Decompression;  Surgeon: Gillie Duncans, MD;  Location: [facility] OR;  Service: Neurosurgery;  Laterality: N/A;   POLYPECTOMY  07/15/2016   Procedure: POLYPECTOMY;  Surgeon: Lamar CHRISTELLA Hollingshead, MD;  Location: [facility] SUITE;  Service: Endoscopy;;  Splenic Flexure polyps x 2 removed via hot snare   POLYPECTOMY  10/10/2019   Procedure: POLYPECTOMY;  Surgeon: Hollingshead Lamar CHRISTELLA, MD;  Location: [facility];  Service: Endoscopy;;   TUBAL LIGATION     FAMILY HISTORY Family History  Problem Relation Age of Onset   Diabetes Brother    Colon polyps Brother    Diabetes Sister    Cancer Father        Likely prostate   Stroke Mother 36   Colon cancer Neg Hx    SOCIAL HISTORY Social History   Tobacco Use   Smoking status: Never   Smokeless tobacco: Never  Vaping Use   Vaping status: Never Used  Substance Use Topics   Alcohol use: No    Alcohol/week: 0.0 standard drinks of alcohol   Drug use: No       OPHTHALMIC EXAM:  Base Eye Exam     Visual Acuity (Snellen - Linear)       Right Left   Dist cc 20/60 20/250 -2   Dist ph cc 20/40 -2     Correction: Glasses         Tonometry (Tonopen, 9:48 AM)       Right Left   Pressure 14 15         Pupils       Pupils Dark Light Shape React APD   Right PERRL 3 2 Round Brisk None   Left PERRL 3 2 Round Brisk None         Visual Fields       Left  Right    Full Full         Extraocular Movement       Right Left    Full, Ortho Full, Ortho         Neuro/Psych     Oriented x3: Yes   Mood/Affect: Normal  Dilation     Both eyes: 2.5% Phenylephrine  @ 9:48 AM           Slit Lamp and Fundus Exam     Slit Lamp Exam       Right Left   Lids/Lashes Dermatochalasis - upper lid Dermatochalasis - upper lid   Conjunctiva/Sclera mild melanosis mild melanosis   Cornea arcus arcus   Anterior Chamber deep and clear deep and clear   Iris Round and dilated Round and dilated   Lens 2+ Nuclear sclerosis, 2+ Cortical cataract 2-3+ Nuclear sclerosis, 2-3+ Cortical cataract   Anterior Vitreous Vitreous syneresis, Posterior vitreous detachment, vitreous condensations Vitreous syneresis, Posterior vitreous detachment         Fundus Exam       Right Left   Disc nasal hyperemia-improved, sharp rim, PPA, mild temporal pallor, no heme, disc elevation, but no frank disc edema Pink and Sharp, mild tilt, +PPA, no edema, focal hyperemia superiorly--improved   C/D Ratio 0.4 0.3   Macula Flat, good foveal reflex, inferior macula atrophic, RPE mottling and clumping, subretinal heme and edema superior macula -- stably improved, punctate exudates superior macula--stably improved Blunted foveal reflex, persistent central and inferior IRH and exudates, central cystic changes, persistend intra and pre retinal heme nasal mac, mild ERM   Vessels attenuated, Tortuous, macroanuerysm with heme along ST arcades -- improving attenuated, Tortuous, retinal macroaneurysms along IT arcades--improved   Periphery Attached, scattered MA / DBH Attached, scattered IRH--improved/essentially resolved, cluster of exudates nasal to disc.           Refraction     Wearing Rx       Sphere Cylinder Axis Add   Right -2.00 +0.75 165 +2.00   Left -1.25 +0.50 148 +2.00           IMAGING AND PROCEDURES  Imaging and Procedures for 10/15/2024  OCT,  Retina - OU - Both Eyes       Right Eye Quality was good. Central Foveal Thickness: 231. Progression has been stable. Findings include normal foveal contour, no IRF, no SRF, myopic contour, intraretinal hyper-reflective material, inner retinal atrophy, outer retinal atrophy (Stable improvement in IRF/SRF/IRHM (macroanuerysm) along ST arcades, Diffuse IRA inferior macula / hemisphere -- remote BRAO; mild disc edema).   Left Eye Quality was good. Central Foveal Thickness: 581. Progression has been stable. Findings include abnormal foveal contour, myopic contour, retinal drusen , subretinal hyper-reflective material, intraretinal hyper-reflective material, intraretinal fluid, outer retinal atrophy (Persistent disc elevation, ERM with central thickening and ORA; drusen, SRHM, persistent IRF/IRHM superior and temporal macula; persistent prominent IRHM nasal mac --slightly increased).   Notes *Images captured and stored on drive  Diagnosis / Impression:  OD: Stable improvement in IRF/SRF/IRHM (macroanuerysm) along ST arcades, Diffuse IRA inferior macula / hemisphere -- remote BRAO; mild disc edema OS: Persistent disc elevation, ERM with central thickening and ORA; drusen, SRHM, persistent IRF/IRHM superior and temporal macula; persistent prominent IRHM nasal mac --slightly increased centrally  Clinical management:  See below  Abbreviations: NFP - Normal foveal profile. CME - cystoid macular edema. PED - pigment epithelial detachment. IRF - intraretinal fluid. SRF - subretinal fluid. EZ - ellipsoid zone. ERM - epiretinal membrane. ORA - outer retinal atrophy. ORT - outer retinal tubulation. SRHM - subretinal hyper-reflective material. IRHM - intraretinal hyper-reflective material      Intravitreal Injection, Pharmacologic Agent - OS - Left Eye       Time Out 10/15/2024. 10:06 AM. Confirmed correct patient, procedure, site, and patient consented.  Anesthesia Topical anesthesia was used.  Anesthetic medications included Lidocaine  2%, Proparacaine 0.5%.   Procedure Preparation included 5% betadine to ocular surface, eyelid speculum. A supplied (33g) needle was used.   Injection: 6 mg faricimab -svoa 6 MG/0.05ML   Route: Intravitreal, Site: Left Eye   NDC: 49757-903-93, Lot: [id number], Expiration date: 09/04/2025, Waste: 0 mL   Post-op Post injection exam found visual acuity of at least counting fingers. The patient tolerated the procedure well. There were no complications. The patient received written and verbal post procedure care education. Post injection medications were not given.            ASSESSMENT/PLAN:    ICD-10-CM   1. 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 Essential hypertension  I10     4. Hypertensive retinopathy of both eyes  H35.033     5. Combined forms of age-related cataract of both eyes  H25.813      1-2. BRVO w/ CME and retinal macronauerysm OS - s/p IVA OS #1 (12.15.23) #2 (01.12.24), #3 (02.09.24), #4 (03.15.24), #5 (04.12.24), #6 (06.07.24) ============================= - s/p IVE OS #1 (05.10.24), #2 (07.08.24) #3 (08.9.24), #4 (09.09.24), #5 (10.11.24), #6 (11.15.24), #7 (12.20.24) #8 (01.24.25), #9 (02.28.25), #10 (04.02.25) #11(05.20.25), #12 (06.20.25), #13 (07.18.25) ============================= - s/p IVV OS #1 (08.15.25), #2 (09.12.25), #3 (10.13.25) - pt previously followed with Dr. Lynwood in [place], [place], but now lives in a facility in [place] and cannot be transported to [facility] - history of laser photoablation of retinal macroaneurysm OS per chart review - FA 12.15.23 shows focal MAs w/ late leakage - BCVA OS 20/250 - stable - exam shows macroaneurysm with surrounding circinate exudates and  edema, inferior macula OS -- improved; but new IRH / macroaneurysm nasal mac - OCT shows OS: Persistent disc elevation, ERM with central thickening and ORA; drusen, SRHM, persistent IRF/IRHM superior and temporal macula; persistent prominent IRHM nasal mac --slightly increased at 4 weeks - recommend IVV OS #4 (11.10.25), today w/ f/u in 4 wks - RBA of procedure discussed, questions answered - IVE informed consent obtained and signed, 05.20.25 (OU) - IVV informed consent obtained and signed, 08.15.25 (OU) - see procedure note **new BRAO superior macula/fovea OS on 01.24.25 visit** - pt reported 3 day history of central visual field loss w/ extension to inferior paracentral VF  - pt reports recent spikes in SBP >200  - discussed findings, prognosis  - f/u 4 weeks, DFE, OCT, possible injection  **Pt approved for Eylea  and Vabysmo  paid 100%**  1-2. 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 - s/p IVA OD #1 (06.07.24) #2 (07.08.24), #3 (08.09.24), #4 (09.06.24), #5 (10.10.24), #6 (11.15.24) ============== - s/p IVE OD #1 (05.20.25), #2 (06.20.25), #3 (07.18.25), #4 (08.15.25) - exam shows peripapillary hemorrhage, mild NVD nasal disc -- improved; macroaneurysm w/ surrounding heme and edema along ST arcades -- improved  - FA 12.15.23 shows mild NVD nasal disc w/ leakage  - pt may benefit from PRP   - BCVA OD 20/40 from 20/30  - OCT shows OD: Stable improvement in IRF/SRF/IRHM (macroanuerysm) along ST arcades, Diffuse IRA inferior macula / hemisphere -- remote BRAO; mild disc edema improved at 4 wks  - recommend holding IVE today OD (11.10.25) w/ f/u in  4 wks - IVE informed consent obtained and signed, 05.20.25 (OU) - f/u 4 weeks, DFE, OCT, possible injection  3,4. Hypertensive retinopathy OU  - pt and son report recent changes in BP meds - discussed importance of tight BP control and likely relationship with BRVOs  5. Mixed Cataract OU - The symptoms of cataract, surgical options, and  treatments and risks were discussed with patient. - discussed diagnosis and progression - likely visually significant - under the expert management of Dr. Cleatus  - had appt with Dr. Alena on November 10, 2023 who did not recommend cataract surgery at this time - clear from a retina standpoint to proceed with cataract surgery when pt and surgeon are ready  - would recommend timing cataract surgery ~1-2 wks after anti-VEGF injection  Ophthalmic Meds Ordered this visit:  Meds ordered this encounter  Medications   faricimab -svoa (VABYSMO ) 6mg /0.32mL intravitreal injection     Return in about 4 weeks (around 11/12/2024) for f/u, BRVO, DFE, OCT, Possible, IVV, OS.  There are no Patient Instructions on file for this visit.  This document serves as a record of services personally performed by Redell JUDITHANN Hans, MD, PhD. It was created on their behalf by Avelina Pereyra, COA an ophthalmic technician. The creation of this record is the provider's dictation and/or activities during the visit.   Electronically signed by: Avelina GORMAN Pereyra, COT  10/15/24  11:37 AM   This document serves as a record of services personally performed by Redell JUDITHANN Hans, MD, PhD. It was created on their behalf by Wanda GEANNIE Keens, COT an ophthalmic technician. The creation of this record is the provider's dictation and/or activities during the visit.    Electronically signed by:  Wanda GEANNIE Keens, COT  10/15/24 11:37 AM  Redell JUDITHANN Hans, M.D., Ph.D. Diseases & Surgery of the Retina and Vitreous [facility]  I have reviewed the above documentation for accuracy and completeness, and I agree with the above. Redell JUDITHANN Hans, M.D., Ph.D. 10/15/24 11:37 AM    Abbreviations: M myopia (nearsighted); A astigmatism; H hyperopia (farsighted); P presbyopia; Mrx spectacle prescription;  CTL contact lenses; OD right eye; OS left eye; OU both eyes  XT exotropia; ET esotropia; PEK punctate epithelial  keratitis; PEE punctate epithelial erosions; DES dry eye syndrome; MGD meibomian gland dysfunction; ATs artificial tears; PFAT's preservative free artificial tears; NSC nuclear sclerotic cataract; PSC posterior subcapsular cataract; ERM epi-retinal membrane; PVD posterior vitreous detachment; RD retinal detachment; DM diabetes mellitus; DR diabetic retinopathy; NPDR non-proliferative diabetic retinopathy; PDR proliferative diabetic retinopathy; CSME clinically significant macular edema; DME diabetic macular edema; dbh dot blot hemorrhages; CWS cotton wool spot; POAG primary open angle glaucoma; C/D cup-to-disc ratio; HVF humphrey visual field; GVF goldmann visual field; OCT optical coherence tomography; IOP intraocular pressure; BRVO Branch retinal vein occlusion; CRVO central retinal vein occlusion; CRAO central retinal artery occlusion; BRAO branch retinal artery occlusion; RT retinal tear; SB scleral buckle; PPV pars plana vitrectomy; VH Vitreous hemorrhage; PRP panretinal laser photocoagulation; IVK intravitreal kenalog; VMT vitreomacular traction; MH Macular hole;  NVD neovascularization of the disc; NVE neovascularization elsewhere; AREDS age related eye disease study; ARMD age related macular degeneration; POAG primary open angle glaucoma; EBMD epithelial/anterior basement membrane dystrophy; ACIOL anterior chamber intraocular lens; IOL intraocular lens; PCIOL posterior chamber intraocular lens; Phaco/IOL phacoemulsification with intraocular lens placement; PRK photorefractive keratectomy; LASIK laser assisted in situ keratomileusis; HTN hypertension; DM diabetes mellitus; COPD chronic obstructive pulmonary disease

## 2024-10-03 ENCOUNTER — Ambulatory Visit: Attending: Cardiology | Admitting: Cardiology

## 2024-10-03 ENCOUNTER — Encounter: Payer: Self-pay | Admitting: Cardiology

## 2024-10-03 VITALS — BP 144/80 | HR 49 | Ht 64.0 in | Wt 300.0 lb

## 2024-10-03 DIAGNOSIS — R001 Bradycardia, unspecified: Secondary | ICD-10-CM | POA: Diagnosis not present

## 2024-10-03 DIAGNOSIS — I10 Essential (primary) hypertension: Secondary | ICD-10-CM

## 2024-10-03 MED ORDER — HYDRALAZINE HCL 50 MG PO TABS: 50.0000 mg | ORAL_TABLET | Freq: Two times a day (BID) | ORAL | 3 refills | Status: AC

## 2024-10-03 NOTE — Patient Instructions (Signed)
 Medication Instructions:  INCREASE hydralazine  to 50 mg twice a day   *If you need a refill on your cardiac medications before your next appointment, please call your pharmacy*  Lab Work: No labs ordered today  If you have labs (blood work) drawn today and your tests are completely normal, you will receive your results only by: MyChart Message (if you have MyChart) OR A paper copy in the mail If you have any lab test that is abnormal or we need to change your treatment, we will call you to review the results.  Testing/Procedures: No test ordered today   Follow-Up: At [facility], you and your health needs are our priority.  As part of our continuing mission to provide you with exceptional heart care, our providers are all part of one team.  This team includes your primary Cardiologist (physician) and Advanced Practice Providers or APPs (Physician Assistants and Nurse Practitioners) who all work together to provide you with the care you need, when you need it.  Your next appointment:   4 month(s)  Provider:   You may see Redell Cave, MD or one of the following Advanced Practice Providers on your designated Care Team:   Lonni Meager, NP Lesley Maffucci, PA-C Bernardino Bring, PA-C Cadence [place], PA-C Tylene Lunch, NP Barnie Hila, NP    We recommend signing up for the patient portal called MyChart.  Sign up information is provided on this After Visit Summary.  MyChart is used to connect with patients for Virtual Visits (Telemedicine).  Patients are able to view lab/test results, encounter notes, upcoming appointments, etc.  Non-urgent messages can be sent to your provider as well.   To learn more about what you can do with MyChart, go to [email or web address].

## 2024-10-03 NOTE — Progress Notes (Signed)
 Cardiology Office Note:    Date:  10/03/2024   ID:  Shelley Mitchell, DOB 04/07/1941, MRN [id number]  PCP:  Thad Bucco, MD   [facility]  Cardiologist:  Redell Cave, MD  Advanced Practice Provider:  No care team member to display Electrophysiologist:  None       Referring MD: Thad Bucco, MD   Chief Complaint  Patient presents with   Follow-up    3  month follow up pt has been doing well with no complaints of chest pain, chest pressure or SOB, medciation reviewed verbally with patient    History of Present Illness:    Shelley Mitchell is a 83 y.o. female with a hx of hypertension, hyperlipidemia, bradycardia, OSA, presenting for follow-up.  Started on hydralazine  last visit due to elevated BP.  BP has improved, although still elevated, systolics usually ranges in the 140s with occasional spikes to 180s.  Tolerating meds as prescribed, no adverse effects.   Prior notes Cardiac monitor 3/24 showed average heart rate heart rates in the 50s, no high degree AV block noted. Echo 12/2022 EF 60 to 65%, aortic valve sclerosis, impaired relaxation.   Past Medical History:  Diagnosis Date   Endometrial polyp    Dr. Jayne   History of endometrial hyperplasia    HTN (hypertension)    Hx of adenomatous colonic polyps 1999   Hyperlipidemia    Mental disorder    GAD   Obesity    OSA on CPAP    follows with [facility]   Osteoarthritis    Tachycardia    subsequently with bradycardia (?secondary to meds)    Past Surgical History:  Procedure Laterality Date   BACK SURGERY     bilateral knee replacements  2002/2003   CHOLECYSTECTOMY     COLONOSCOPY  03/2002   Dr. Riesa internal hemorrhoids   COLONOSCOPY  06/21/2011   polyp at IC valve (tubular adenoma)   COLONOSCOPY N/A 07/15/2016   one 8 mm polyp at hepatic flexure (tubular adenoma), on 12 mm polyp at hepatic flexure (tubular adenoma), 3 year surveillance if health permits   COLONOSCOPY N/A  10/10/2019   descending colon diverticulosis, two 4-5 mm polyps at splenic flexure (tubular adenomas). No surveillance due to age.    LUMBAR LAMINECTOMY/DECOMPRESSION MICRODISCECTOMY N/A 04/27/2022   Procedure: Thoracic Eleven-Twelve Decompression;  Surgeon: Gillie Duncans, MD;  Location: [facility];  Service: Neurosurgery;  Laterality: N/A;   POLYPECTOMY  07/15/2016   Procedure: POLYPECTOMY;  Surgeon: Lamar CHRISTELLA Hollingshead, MD;  Location: [facility];  Service: Endoscopy;;  Splenic Flexure polyps x 2 removed via hot snare   POLYPECTOMY  10/10/2019   Procedure: POLYPECTOMY;  Surgeon: Hollingshead Lamar CHRISTELLA, MD;  Location: [facility];  Service: Endoscopy;;   TUBAL LIGATION      Current Medications: No outpatient medications have been marked as taking for the 10/03/24 encounter (Office Visit) with Cave Redell, MD.     Allergies:   Ace inhibitors and Nsaids   Social History   Socioeconomic History   Marital status: Married    Spouse name: Not on file   Number of children: 4   Years of education: Not on file   Highest education level: Not on file  Occupational History    Employer: RETIRED  Tobacco Use   Smoking status: Never   Smokeless tobacco: Never  Vaping Use   Vaping status: Never Used  Substance and Sexual Activity   Alcohol use: No  Alcohol/week: 0.0 standard drinks of alcohol   Drug use: No   Sexual activity: Not Currently    Birth control/protection: Post-menopausal  Other Topics Concern   Not on file  Social History Narrative   Not on file   Social Drivers of Health   Financial Resource Strain: Low Risk  (02/07/2024)   Received from [facility]   Overall Financial Resource Strain (CARDIA)    Difficulty of Paying Living Expenses: Not hard at all  Food Insecurity: No Food Insecurity (02/07/2024)   Received from [facility]   Hunger Vital Sign    Within the past 12 months, you worried that your food would run out before you got the money to  buy more.: Never true    Within the past 12 months, the food you bought just didn't last and you didn't have money to get more.: Never true  Transportation Needs: No Transportation Needs (02/07/2024)   Received from [facility] - Transportation    In the past 12 months, has lack of transportation kept you from medical appointments or from getting medications?: No    Lack of Transportation (Non-Medical): No  Physical Activity: Not on file  Stress: Not on file  Social Connections: Not on file     Family History: The patient's family history includes Cancer in her father; Colon polyps in her brother; Diabetes in her brother and sister; Stroke (age of onset: 63) in her mother. There is no history of Colon cancer.  ROS:   Please see the history of present illness.     All other systems reviewed and are negative.  EKGs/Labs/Other Studies Reviewed:    The following studies were reviewed today:       Recent Labs: No results found for requested labs within last 365 days.  Recent Lipid Panel    Component Value Date/Time   CHOL 160 04/24/2022 0341   TRIG 83 04/24/2022 0341   HDL 67 04/24/2022 0341   CHOLHDL 2.4 04/24/2022 0341   VLDL 17 04/24/2022 0341   LDLCALC 76 04/24/2022 0341     Risk Assessment/Calculations:      Physical Exam:    VS:  BP (!) 144/80 (BP Location: Left Arm, Patient Position: Sitting)   Pulse (!) 49   Ht 5' 4 (1.626 m)   Wt 300 lb (136.1 kg)   SpO2 95%   BMI 51.49 kg/m     Wt Readings from Last 3 Encounters:  10/03/24 300 lb (136.1 kg)  07/03/24 279 lb (126.6 kg)  05/22/24 279 lb 8 oz (126.8 kg)     GEN:  Well nourished, well developed in no acute distress HEENT: Normal NECK: No JVD; No carotid bruits CARDIAC: Bradycardic, regular, 2/6 systolic murmur RESPIRATORY:  Clear to auscultation without rales, wheezing or rhonchi  ABDOMEN: Soft, non-tender, non-distended MUSCULOSKELETAL:  1+ edema; No deformity  SKIN: Warm  and dry NEUROLOGIC:  Alert and oriented x 3 PSYCHIATRIC:  Normal affect   ASSESSMENT:    1. Primary hypertension   2. Morbid obesity (HCC)   3. Sinus bradycardia     PLAN:    In order of problems listed above:  Hypertension, BP elevated.  Increase hydralazine  to 50 mg twice daily, continue Norvasc  10 mg daily, continue losartan  100 mg daily.  Add hydralazine  at follow-up visit if BP still not controlled. Morbid obesity, continue low-calorie diet. Asymptomatic sinus bradycardia, previous cardiac monitor with no high degree AV block.  No  indication for pacemaker.   Follow-up in 3-4 months.   Medication Adjustments/Labs and Tests Ordered: Current medicines are reviewed at length with the patient today.  Concerns regarding medicines are outlined above.  No orders of the defined types were placed in this encounter.  Meds ordered this encounter  Medications   hydrALAZINE  (APRESOLINE ) 50 MG tablet    Sig: Take 1 tablet (50 mg total) by mouth 2 (two) times daily.    Dispense:  180 tablet    Refill:  3    Patient Instructions  Medication Instructions:  INCREASE hydralazine  to 50 mg twice a day   *If you need a refill on your cardiac medications before your next appointment, please call your pharmacy*  Lab Work: No labs ordered today  If you have labs (blood work) drawn today and your tests are completely normal, you will receive your results only by: MyChart Message (if you have MyChart) OR A paper copy in the mail If you have any lab test that is abnormal or we need to change your treatment, we will call you to review the results.  Testing/Procedures: No test ordered today   Follow-Up: At [facility], you and your health needs are our priority.  As part of our continuing mission to provide you with exceptional heart care, our providers are all part of one team.  This team includes your primary Cardiologist (physician) and Advanced Practice Providers or APPs  (Physician Assistants and Nurse Practitioners) who all work together to provide you with the care you need, when you need it.  Your next appointment:   4 month(s)  Provider:   You may see Redell Cave, MD or one of the following Advanced Practice Providers on your designated Care Team:   Lonni Meager, NP Lesley Maffucci, PA-C Bernardino Bring, PA-C Cadence [place], PA-C Tylene Lunch, NP Barnie Hila, NP    We recommend signing up for the patient portal called MyChart.  Sign up information is provided on this After Visit Summary.  MyChart is used to connect with patients for Virtual Visits (Telemedicine).  Patients are able to view lab/test results, encounter notes, upcoming appointments, etc.  Non-urgent messages can be sent to your provider as well.   To learn more about what you can do with MyChart, go to [email or web address].              Signed, Redell Cave, MD  10/03/2024 11:57 AM    [facility]

## 2024-10-15 ENCOUNTER — Ambulatory Visit (INDEPENDENT_AMBULATORY_CARE_PROVIDER_SITE_OTHER): Admitting: Ophthalmology

## 2024-10-15 ENCOUNTER — Encounter (INDEPENDENT_AMBULATORY_CARE_PROVIDER_SITE_OTHER): Payer: Self-pay | Admitting: Ophthalmology

## 2024-10-15 DIAGNOSIS — H35033 Hypertensive retinopathy, bilateral: Secondary | ICD-10-CM

## 2024-10-15 DIAGNOSIS — I10 Essential (primary) hypertension: Secondary | ICD-10-CM | POA: Diagnosis not present

## 2024-10-15 DIAGNOSIS — H25813 Combined forms of age-related cataract, bilateral: Secondary | ICD-10-CM

## 2024-10-15 DIAGNOSIS — H3509 Other intraretinal microvascular abnormalities: Secondary | ICD-10-CM | POA: Diagnosis not present

## 2024-10-15 DIAGNOSIS — H34833 Tributary (branch) retinal vein occlusion, bilateral, with macular edema: Secondary | ICD-10-CM | POA: Diagnosis not present

## 2024-10-15 MED ORDER — FARICIMAB-SVOA 6 MG/0.05ML IZ SOSY
6.0000 mg | PREFILLED_SYRINGE | INTRAVITREAL | Status: AC | PRN
  Administered 2024-10-15: 6 mg via INTRAVITREAL

## 2024-10-29 NOTE — Progress Notes (Signed)
 [facility] - Clinic Note  11/12/2024     CHIEF COMPLAINT Patient presents for Retina Follow Up   HISTORY OF PRESENT ILLNESS: Shelley Mitchell is a 83 y.o. female who presents to the clinic today for:   HPI     Retina Follow Up   Patient presents with  CRVO/BRVO.  In both eyes.  This started 2 years ago.  Duration of 4 weeks.  Since onset it is stable.  I, the attending physician,  performed the HPI with the patient and updated documentation appropriately.        Comments   4 week retina retina follow up BRVO OU. Pt c/o crescent moon OS that started last week, it comes and goes. Pt denies FOL. Pt consistent with latanoprost  at bedtime OU.      Last edited by Valdemar Rogue, MD on 11/13/2024  2:19 AM.     Patient reports BP has been maintained well at facility.   Referring physician: Cleatus Collar, MD [address] [place],  [place] [address]  HISTORICAL INFORMATION:   Selected notes from the MEDICAL RECORD NUMBER Referred by Dr. Cleatus for concern of BDR / PDR LEE:  Ocular Hx- PMH-    CURRENT MEDICATIONS: Current Outpatient Medications (Ophthalmic Drugs)  Medication Sig   latanoprost  (XALATAN ) 0.005 % ophthalmic solution Place 1 drop into both eyes at bedtime.   No current facility-administered medications for this visit. (Ophthalmic Drugs)   Current Outpatient Medications (Other)  Medication Sig   acetaminophen  (TYLENOL ) 500 MG tablet Take 1,000 mg by mouth every 12 (twelve) hours.   albuterol  (VENTOLIN  HFA) 108 (90 Base) MCG/ACT inhaler Inhale 2 puffs into the lungs every 8 (eight) hours as needed for wheezing or shortness of breath.   Amino Acids-Protein Hydrolys (PRO-STAT AWC) LIQD Take 30 mLs by mouth 2 (two) times daily between meals.   amLODipine  (NORVASC ) 10 MG tablet Take 1 tablet (10 mg total) by mouth daily.   aspirin EC 81 MG tablet Take 81 mg by mouth daily. Swallow whole.   furosemide  (LASIX ) 20 MG tablet Take 20 mg by mouth  daily.   hydrALAZINE  (APRESOLINE ) 50 MG tablet Take 1 tablet (50 mg total) by mouth 2 (two) times daily.   levothyroxine (SYNTHROID) 25 MCG tablet Take 25 mcg by mouth daily before breakfast.   losartan  (COZAAR ) 100 MG tablet Take 1 tablet (100 mg total) by mouth daily.   lubiprostone  (AMITIZA ) 8 MCG capsule Take 1 capsule (8 mcg total) by mouth daily with breakfast.   medroxyPROGESTERone  (PROVERA ) 10 MG tablet TAKE 1 TABLET BY MOUTH DAILY 10 DAYS PER MONTH   Multiple Vitamins-Minerals (MULTIVITAMIN WITH MINERALS) tablet Take 1 tablet by mouth daily.   nystatin powder Apply 1 Application topically 3 (three) times daily.   omeprazole  (PRILOSEC) 20 MG capsule Take 1 capsule (20 mg total) by mouth daily.   polyethylene glycol powder (GLYCOLAX /MIRALAX ) 17 GM/SCOOP powder Take 17 g by mouth daily.   pravastatin  (PRAVACHOL ) 40 MG tablet Take 40 mg by mouth at bedtime.   sertraline  (ZOLOFT ) 100 MG tablet Take 100 mg by mouth daily.   tirzepatide  5 MG/0.5ML injection vial Inject 5 mg into the skin once a week.   No current facility-administered medications for this visit. (Other)   REVIEW OF SYSTEMS: ROS   Positive for: Musculoskeletal, Cardiovascular, Eyes, Respiratory Last edited by Ashley Beards D on 11/12/2024  9:41 AM.       ALLERGIES Allergies  Allergen Reactions  Ace Inhibitors Itching and Hives   Nsaids Itching   PAST MEDICAL HISTORY Past Medical History:  Diagnosis Date   Endometrial polyp    Dr. Jayne   History of endometrial hyperplasia    HTN (hypertension)    Hx of adenomatous colonic polyps 1999   Hyperlipidemia    Mental disorder    GAD   Obesity    OSA on CPAP    follows with [facility]   Osteoarthritis    Tachycardia    subsequently with bradycardia (?secondary to meds)   Past Surgical History:  Procedure Laterality Date   BACK SURGERY     bilateral knee replacements  2002/2003   CHOLECYSTECTOMY     COLONOSCOPY  03/2002   Dr. Riesa internal  hemorrhoids   COLONOSCOPY  06/21/2011   polyp at IC valve (tubular adenoma)   COLONOSCOPY N/A 07/15/2016   one 8 mm polyp at hepatic flexure (tubular adenoma), on 12 mm polyp at hepatic flexure (tubular adenoma), 3 year surveillance if health permits   COLONOSCOPY N/A 10/10/2019   descending colon diverticulosis, two 4-5 mm polyps at splenic flexure (tubular adenomas). No surveillance due to age.    LUMBAR LAMINECTOMY/DECOMPRESSION MICRODISCECTOMY N/A 04/27/2022   Procedure: Thoracic Eleven-Twelve Decompression;  Surgeon: Gillie Duncans, MD;  Location: [facility] OR;  Service: Neurosurgery;  Laterality: N/A;   POLYPECTOMY  07/15/2016   Procedure: POLYPECTOMY;  Surgeon: Lamar CHRISTELLA Hollingshead, MD;  Location: [facility];  Service: Endoscopy;;  Splenic Flexure polyps x 2 removed via hot snare   POLYPECTOMY  10/10/2019   Procedure: POLYPECTOMY;  Surgeon: Hollingshead Lamar CHRISTELLA, MD;  Location: [facility];  Service: Endoscopy;;   TUBAL LIGATION     FAMILY HISTORY Family History  Problem Relation Age of Onset   Diabetes Brother    Colon polyps Brother    Diabetes Sister    Cancer Father        Likely prostate   Stroke Mother 3   Colon cancer Neg Hx    SOCIAL HISTORY Social History   Tobacco Use   Smoking status: Never   Smokeless tobacco: Never  Vaping Use   Vaping status: Never Used  Substance Use Topics   Alcohol use: No    Alcohol/week: 0.0 standard drinks of alcohol   Drug use: No       OPHTHALMIC EXAM:  Base Eye Exam     Visual Acuity (Snellen - Linear)       Right Left   Dist cc 20/50 -2 cf @ 3'   Dist ph cc 20/40 -2 NI    Correction: Glasses         Tonometry (Tonopen, 9:55 AM)       Right Left   Pressure 13 13         Pupils       Pupils Dark Light Shape React APD   Right PERRL 3 2 Round Brisk None   Left PERRL 3 2 Round Brisk None         Visual Fields       Left Right     Full   Restrictions Partial inner superior temporal, inferior temporal, superior nasal,  inferior nasal deficiencies          Extraocular Movement       Right Left    Full, Ortho Full, Ortho         Neuro/Psych     Oriented x3: Yes   Mood/Affect: Normal  Dilation     Both eyes: 1.0% Mydriacyl, 2.5% Phenylephrine  @ 9:55 AM           Slit Lamp and Fundus Exam     Slit Lamp Exam       Right Left   Lids/Lashes Dermatochalasis - upper lid Dermatochalasis - upper lid   Conjunctiva/Sclera mild melanosis mild melanosis   Cornea arcus arcus   Anterior Chamber deep and clear deep and clear   Iris Round and dilated Round and dilated   Lens 2+ Nuclear sclerosis, 2+ Cortical cataract 2-3+ Nuclear sclerosis, 2-3+ Cortical cataract   Anterior Vitreous Vitreous syneresis, Posterior vitreous detachment, vitreous condensations Vitreous syneresis, Posterior vitreous detachment         Fundus Exam       Right Left   Disc nasal hyperemia-improved, sharp rim, PPA, mild temporal pallor, no heme, disc elevation, but no frank disc edema Pink and Sharp, mild tilt, +PPA, no edema, focal hyperemia superiorly--improved   C/D Ratio 0.4 0.3   Macula Flat, good foveal reflex, inferior macula atrophic, RPE mottling and clumping, subretinal heme and edema superior macula -- stably improved, punctate exudates superior macula--stably improved Blunted foveal reflex, persistent central and inferior IRH and exudates, central cystic changes--increased, persistent intra and pre retinal heme nasal mac, mild ERM, interval increase in subretinal heme superior mac   Vessels attenuated, Tortuous, macroanuerysm with heme along ST arcades -- improving attenuated, Tortuous, retinal macroaneurysms along IT arcades--improved   Periphery Attached, scattered MA / DBH Attached, scattered IRH--improved/essentially resolved, cluster of exudates nasal to disc.           Refraction     Wearing Rx       Sphere Cylinder Axis Add   Right -2.00 +0.75 165 +2.00   Left -1.25 +0.50 148 +2.00          Manifest Refraction   No improvements OS          IMAGING AND PROCEDURES  Imaging and Procedures for 11/12/2024  OCT, Retina - OU - Both Eyes       Right Eye Quality was good. Central Foveal Thickness: 230. Progression has been stable. Findings include normal foveal contour, no IRF, no SRF, myopic contour, intraretinal hyper-reflective material, inner retinal atrophy, outer retinal atrophy (Stable improvement in IRF/SRF/IRHM (macroanuerysm) along ST arcades, Diffuse IRA inferior macula / hemisphere -- remote BRAO; mild disc edema).   Left Eye Quality was good. Central Foveal Thickness: 679. Progression has worsened. Findings include abnormal foveal contour, myopic contour, retinal drusen , subretinal hyper-reflective material, intraretinal hyper-reflective material, intraretinal fluid, subretinal fluid, outer retinal atrophy (Persistent disc elevation, ERM with central thickening and ORA; drusen, SRHM, persistent IRF/IRHM superior and temporal macula--increased; persistent prominent IRHM nasal mac --increased).   Notes *Images captured and stored on drive  Diagnosis / Impression:  OD: Stable improvement in IRF/SRF/IRHM (macroanuerysm) along ST arcades, Diffuse IRA inferior macula / hemisphere -- remote BRAO; mild disc edema OS: Persistent disc elevation, ERM with central thickening and ORA; drusen, SRHM, persistent IRF/IRHM superior and temporal macula--increased; persistent prominent IRHM nasal mac --increased  Clinical management:  See below  Abbreviations: NFP - Normal foveal profile. CME - cystoid macular edema. PED - pigment epithelial detachment. IRF - intraretinal fluid. SRF - subretinal fluid. EZ - ellipsoid zone. ERM - epiretinal membrane. ORA - outer retinal atrophy. ORT - outer retinal tubulation. SRHM - subretinal hyper-reflective material. IRHM - intraretinal hyper-reflective material      Intravitreal Injection, Pharmacologic Agent - OS -  Left Eye        Time Out 11/12/2024. 10:55 AM. Confirmed correct patient, procedure, site, and patient consented.   Anesthesia Topical anesthesia was used. Anesthetic medications included Lidocaine  2%, Proparacaine 0.5%.   Procedure Preparation included 5% betadine to ocular surface, eyelid speculum. A supplied (33g) needle was used.   Injection: 6 mg faricimab -svoa 6 MG/0.05ML   Route: Intravitreal, Site: Left Eye   NDC: 49757-903-93, Lot: [id number], Expiration date: 01/04/2026, Waste: 0 mL   Post-op Post injection exam found visual acuity of at least counting fingers. The patient tolerated the procedure well. There were no complications. The patient received written and verbal post procedure care education. Post injection medications were not given.             ASSESSMENT/PLAN:    ICD-10-CM   1. 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 Essential hypertension  I10     4. Hypertensive retinopathy of both eyes  H35.033     5. Combined forms of age-related cataract of both eyes  H25.813       1-2. BRVO w/ CME and retinal macronauerysm OS - s/p IVA OS #1 (12.15.23) #2 (01.12.24), #3 (02.09.24), #4 (03.15.24), #5 (04.12.24), #6 (06.07.24) ============================= - s/p IVE OS #1 (05.10.24), #2 (07.08.24) #3 (08.9.24), #4 (09.09.24), #5 (10.11.24), #6 (11.15.24), #7 (12.20.24) #8 (01.24.25), #9 (02.28.25), #10 (04.02.25) #11(05.20.25), #12 (06.20.25), #13 (07.18.25) ============================= - s/p IVV OS #1 (08.15.25), #2 (09.12.25), #3 (10.13.25), #4 (11.10.25), - pt previously followed with Dr. Lynwood in [place], [place], but now lives in a facility in [place] and cannot be transported to [facility] - history of laser photoablation of retinal macroaneurysm  OS per chart review - FA 12.15.23 shows focal MAs w/ late leakage - BCVA OS decreased to CF@3 ' from 20/250 - exam shows macroaneurysm with surrounding circinate exudates and edema, inferior macula OS -- improved; but new IRH / macroaneurysm nasal mac -- increased w/ new SRH - OCT shows OS: Persistent disc elevation, ERM with central thickening and ORA; drusen, SRHM, persistent IRF/IRHM superior and temporal macula--increased; persistent prominent IRHM nasal mac --increased at 4 weeks - recommend IVV OS #5 (12.08.25), today w/ f/u in 4-5 wks - RBA of procedure discussed, questions answered - IVE informed consent obtained and signed, 05.20.25 (OU) - IVV informed consent obtained and signed, 08.15.25 (OU) - see procedure note **new BRAO superior macula/fovea OS on 01.24.25 visit** - pt reported 3 day history of central visual field loss w/ extension to inferior paracentral VF  - pt reports recent spikes in SBP >200  - discussed findings, prognosis  - f/u 4 weeks, DFE, OCT, possible injection  **Pt approved for Eylea  and Vabysmo  paid 100%**  1-2. 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 - s/p IVA OD #1 (06.07.24) #2 (07.08.24), #3 (08.09.24), #4 (09.06.24), #5 (10.10.24), #6 (11.15.24) ============== - s/p IVE OD #1 (05.20.25), #2 (06.20.25), #3 (07.18.25), #4 (08.15.25) - exam shows peripapillary hemorrhage, mild NVD nasal disc -- improved; macroaneurysm w/ surrounding heme and edema along ST arcades -- improved  - FA 12.15.23 shows mild NVD nasal disc w/ leakage  - pt may benefit from PRP   - BCVA OD 20/40 stable  - OCT shows OD: Stable improvement in  IRF/SRF/IRHM (macroanuerysm) along ST arcades, Diffuse IRA inferior macula / hemisphere -- remote BRAO; mild disc edema improved at 4 months since last I'VE OD  - recommend holding IVE today OD (12.08.2025) w/ f/u in 4 wks - IVE informed consent obtained and signed, 05.20.25 (OU) - f/u 4 weeks, DFE, OCT, possible injection  3,4. Hypertensive  retinopathy OU  - pt and son report recent changes in BP meds - discussed importance of tight BP control and likely relationship with BRVOs  5. Mixed Cataract OU - The symptoms of cataract, surgical options, and treatments and risks were discussed with patient. - discussed diagnosis and progression - likely visually significant - under the expert management of Dr. Cleatus  - had appt with Dr. Alena on November 10, 2023 who did not recommend cataract surgery at this time - clear from a retina standpoint to proceed with cataract surgery when pt and surgeon are ready  - would recommend timing cataract surgery ~1-2 wks after anti-VEGF injection  Ophthalmic Meds Ordered this visit:  Meds ordered this encounter  Medications   faricimab -svoa (VABYSMO ) 6mg /0.50mL intravitreal injection     Return in about 4 weeks (around 12/10/2024) for BRVO OS, DFE, OCT, Possible Injxn.  There are no Patient Instructions on file for this visit.  This document serves as a record of services personally performed by Redell JUDITHANN Hans, MD, PhD. It was created on their behalf by Avelina Pereyra, COA an ophthalmic technician. The creation of this record is the provider's dictation and/or activities during the visit.   Electronically signed by: Avelina GORMAN Pereyra, COT  11/13/24  2:21 AM   This document serves as a record of services personally performed by Redell JUDITHANN Hans, MD, PhD. It was created on their behalf by Wanda GEANNIE Keens, COT an ophthalmic technician. The creation of this record is the provider's dictation and/or activities during the visit.    Electronically signed by:  Wanda GEANNIE Keens, COT  11/13/24 2:21 AM  This document serves as a record of services personally performed by Redell JUDITHANN Hans, MD, PhD. It was created on their behalf by Almetta Pesa, an ophthalmic technician. The creation of this record is the provider's dictation and/or activities during the visit.    Electronically signed by:  Almetta Pesa, OA, 11/13/24  2:21 AM  Redell JUDITHANN Hans, M.D., Ph.D. Diseases & Surgery of the Retina and Vitreous [facility]  I have reviewed the above documentation for accuracy and completeness, and I agree with the above. Redell JUDITHANN Hans, M.D., Ph.D. 11/13/24 2:30 AM   Abbreviations: M myopia (nearsighted); A astigmatism; H hyperopia (farsighted); P presbyopia; Mrx spectacle prescription;  CTL contact lenses; OD right eye; OS left eye; OU both eyes  XT exotropia; ET esotropia; PEK punctate epithelial keratitis; PEE punctate epithelial erosions; DES dry eye syndrome; MGD meibomian gland dysfunction; ATs artificial tears; PFAT's preservative free artificial tears; NSC nuclear sclerotic cataract; PSC posterior subcapsular cataract; ERM epi-retinal membrane; PVD posterior vitreous detachment; RD retinal detachment; DM diabetes mellitus; DR diabetic retinopathy; NPDR non-proliferative diabetic retinopathy; PDR proliferative diabetic retinopathy; CSME clinically significant macular edema; DME diabetic macular edema; dbh dot blot hemorrhages; CWS cotton wool spot; POAG primary open angle glaucoma; C/D cup-to-disc ratio; HVF humphrey visual field; GVF goldmann visual field; OCT optical coherence tomography; IOP intraocular pressure; BRVO Branch retinal vein occlusion; CRVO central retinal vein occlusion; CRAO central retinal artery occlusion; BRAO branch retinal artery occlusion; RT retinal tear; SB scleral buckle; PPV pars plana vitrectomy;  VH Vitreous hemorrhage; PRP panretinal laser photocoagulation; IVK intravitreal kenalog; VMT vitreomacular traction; MH Macular hole;  NVD neovascularization of the disc; NVE neovascularization elsewhere; AREDS age related eye disease study; ARMD age related macular degeneration; POAG primary open angle glaucoma; EBMD epithelial/anterior basement membrane dystrophy; ACIOL anterior chamber intraocular lens; IOL intraocular lens; PCIOL posterior  chamber intraocular lens; Phaco/IOL phacoemulsification with intraocular lens placement; PRK photorefractive keratectomy; LASIK laser assisted in situ keratomileusis; HTN hypertension; DM diabetes mellitus; COPD chronic obstructive pulmonary disease

## 2024-11-12 ENCOUNTER — Ambulatory Visit (INDEPENDENT_AMBULATORY_CARE_PROVIDER_SITE_OTHER): Admitting: Ophthalmology

## 2024-11-12 DIAGNOSIS — I10 Essential (primary) hypertension: Secondary | ICD-10-CM | POA: Diagnosis not present

## 2024-11-12 DIAGNOSIS — H25813 Combined forms of age-related cataract, bilateral: Secondary | ICD-10-CM

## 2024-11-12 DIAGNOSIS — H34833 Tributary (branch) retinal vein occlusion, bilateral, with macular edema: Secondary | ICD-10-CM

## 2024-11-12 DIAGNOSIS — H3509 Other intraretinal microvascular abnormalities: Secondary | ICD-10-CM | POA: Diagnosis not present

## 2024-11-12 DIAGNOSIS — H35033 Hypertensive retinopathy, bilateral: Secondary | ICD-10-CM | POA: Diagnosis not present

## 2024-11-13 ENCOUNTER — Encounter (INDEPENDENT_AMBULATORY_CARE_PROVIDER_SITE_OTHER): Payer: Self-pay | Admitting: Ophthalmology

## 2024-11-13 MED ORDER — FARICIMAB-SVOA 6 MG/0.05ML IZ SOSY
6.0000 mg | PREFILLED_SYRINGE | INTRAVITREAL | Status: AC | PRN
  Administered 2024-11-13: 6 mg via INTRAVITREAL

## 2024-11-15 ENCOUNTER — Other Ambulatory Visit (HOSPITAL_COMMUNITY): Payer: Self-pay

## 2024-11-22 ENCOUNTER — Encounter (INDEPENDENT_AMBULATORY_CARE_PROVIDER_SITE_OTHER): Admitting: Ophthalmology

## 2024-11-27 NOTE — Progress Notes (Shared)
 " [facility] - Clinic Note  12/11/2024     CHIEF COMPLAINT Patient presents for No chief complaint on file.   HISTORY OF PRESENT ILLNESS: Shelley Mitchell is a 83 y.o. female who presents to the clinic today for:     Patient reports BP has been maintained well at facility.   Referring physician: Cleatus Collar, MD [address] [place],  [place] [address]  HISTORICAL INFORMATION:   Selected notes from the MEDICAL RECORD NUMBER Referred by Dr. Cleatus for concern of BDR / PDR LEE:  Ocular Hx- PMH-    CURRENT MEDICATIONS: Current Outpatient Medications (Ophthalmic Drugs)  Medication Sig   latanoprost  (XALATAN ) 0.005 % ophthalmic solution Place 1 drop into both eyes at bedtime.   No current facility-administered medications for this visit. (Ophthalmic Drugs)   Current Outpatient Medications (Other)  Medication Sig   acetaminophen  (TYLENOL ) 500 MG tablet Take 1,000 mg by mouth every 12 (twelve) hours.   albuterol  (VENTOLIN  HFA) 108 (90 Base) MCG/ACT inhaler Inhale 2 puffs into the lungs every 8 (eight) hours as needed for wheezing or shortness of breath.   Amino Acids-Protein Hydrolys (PRO-STAT AWC) LIQD Take 30 mLs by mouth 2 (two) times daily between meals.   amLODipine  (NORVASC ) 10 MG tablet Take 1 tablet (10 mg total) by mouth daily.   aspirin EC 81 MG tablet Take 81 mg by mouth daily. Swallow whole.   furosemide  (LASIX ) 20 MG tablet Take 20 mg by mouth daily.   hydrALAZINE  (APRESOLINE ) 50 MG tablet Take 1 tablet (50 mg total) by mouth 2 (two) times daily.   levothyroxine (SYNTHROID) 25 MCG tablet Take 25 mcg by mouth daily before breakfast.   losartan  (COZAAR ) 100 MG tablet Take 1 tablet (100 mg total) by mouth daily.   lubiprostone  (AMITIZA ) 8 MCG capsule Take 1 capsule (8 mcg total) by mouth daily with breakfast.   medroxyPROGESTERone  (PROVERA ) 10 MG tablet TAKE 1 TABLET BY MOUTH DAILY 10 DAYS PER MONTH   Multiple Vitamins-Minerals (MULTIVITAMIN  WITH MINERALS) tablet Take 1 tablet by mouth daily.   nystatin powder Apply 1 Application topically 3 (three) times daily.   omeprazole  (PRILOSEC) 20 MG capsule Take 1 capsule (20 mg total) by mouth daily.   polyethylene glycol powder (GLYCOLAX /MIRALAX ) 17 GM/SCOOP powder Take 17 g by mouth daily.   pravastatin  (PRAVACHOL ) 40 MG tablet Take 40 mg by mouth at bedtime.   sertraline  (ZOLOFT ) 100 MG tablet Take 100 mg by mouth daily.   tirzepatide  5 MG/0.5ML injection vial Inject 5 mg into the skin once a week.   No current facility-administered medications for this visit. (Other)   REVIEW OF SYSTEMS:     ALLERGIES Allergies  Allergen Reactions   Ace Inhibitors Itching and Hives   Nsaids Itching   PAST MEDICAL HISTORY Past Medical History:  Diagnosis Date   Endometrial polyp    Dr. Jayne   History of endometrial hyperplasia    HTN (hypertension)    Hx of adenomatous colonic polyps 1999   Hyperlipidemia    Mental disorder    GAD   Obesity    OSA on CPAP    follows with [facility]   Osteoarthritis    Tachycardia    subsequently with bradycardia (?secondary to meds)   Past Surgical History:  Procedure Laterality Date   BACK SURGERY     bilateral knee replacements  2002/2003   CHOLECYSTECTOMY     COLONOSCOPY  03/2002   Dr. Riesa internal  hemorrhoids   COLONOSCOPY  06/21/2011   polyp at IC valve (tubular adenoma)   COLONOSCOPY N/A 07/15/2016   one 8 mm polyp at hepatic flexure (tubular adenoma), on 12 mm polyp at hepatic flexure (tubular adenoma), 3 year surveillance if health permits   COLONOSCOPY N/A 10/10/2019   descending colon diverticulosis, two 4-5 mm polyps at splenic flexure (tubular adenomas). No surveillance due to age.    LUMBAR LAMINECTOMY/DECOMPRESSION MICRODISCECTOMY N/A 04/27/2022   Procedure: Thoracic Eleven-Twelve Decompression;  Surgeon: Gillie Duncans, MD;  Location: [facility] OR;  Service: Neurosurgery;  Laterality: N/A;   POLYPECTOMY  07/15/2016    Procedure: POLYPECTOMY;  Surgeon: Lamar CHRISTELLA Hollingshead, MD;  Location: [facility];  Service: Endoscopy;;  Splenic Flexure polyps x 2 removed via hot snare   POLYPECTOMY  10/10/2019   Procedure: POLYPECTOMY;  Surgeon: Hollingshead Lamar CHRISTELLA, MD;  Location: [facility];  Service: Endoscopy;;   TUBAL LIGATION     FAMILY HISTORY Family History  Problem Relation Age of Onset   Diabetes Brother    Colon polyps Brother    Diabetes Sister    Cancer Father        Likely prostate   Stroke Mother 28   Colon cancer Neg Hx    SOCIAL HISTORY Social History   Tobacco Use   Smoking status: Never   Smokeless tobacco: Never  Vaping Use   Vaping status: Never Used  Substance Use Topics   Alcohol use: No    Alcohol/week: 0.0 standard drinks of alcohol   Drug use: No       OPHTHALMIC EXAM:  Not recorded    IMAGING AND PROCEDURES  Imaging and Procedures for 12/11/2024           ASSESSMENT/PLAN:  No diagnosis found.   1-2. 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 Lynwood in [place], [place], but now lives in a facility in [place] and cannot be transported to [facility] - history of laser photoablation of retinal macroaneurysm OS per chart review - FA 12.15.23 shows focal MAs w/ late leakage - BCVA OS decreased to CF@3 ' from 20/250 - exam shows macroaneurysm with surrounding circinate exudates and edema, inferior macula OS -- improved; but new IRH / macroaneurysm nasal mac -- increased w/ new SRH - OCT shows OS:  Persistent disc elevation, ERM with central thickening and ORA; drusen, SRHM, persistent IRF/IRHM superior and temporal macula--increased; persistent prominent IRHM nasal mac --increased at 4 weeks - recommend IVV OS #6 (01.06.26), today w/ f/u in 4-5 wks - RBA of procedure discussed, questions answered - IVE informed consent obtained and signed, 05.20.25 (OU) - IVV informed consent obtained and signed, 08.15.25 (OU) - see procedure note **new BRAO superior macula/fovea OS on 01.24.25 visit** - pt reported 3 day history of central visual field loss w/ extension to inferior paracentral VF  - pt reports recent spikes in SBP >200  - discussed findings, prognosis  - f/u 4 weeks, DFE, OCT, possible injection  **Pt approved for Eylea  and Vabysmo  paid 100%**  1-2. 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 - s/p IVA OD #1 (06.07.24) #2 (  07.08.24), #3 (08.09.24), #4 (09.06.24), #5 (10.10.24), #6 (11.15.24) ============== - s/p IVE OD #1 (05.20.25), #2 (06.20.25), #3 (07.18.25), #4 (08.15.25) - exam shows peripapillary hemorrhage, mild NVD nasal disc -- improved; macroaneurysm w/ surrounding heme and edema along ST arcades -- improved  - FA 12.15.23 shows mild NVD nasal disc w/ leakage  - pt may benefit from PRP   - BCVA OD 20/40 stable  - OCT shows OD: Stable improvement in IRF/SRF/IRHM (macroanuerysm) along ST arcades, Diffuse IRA inferior macula / hemisphere -- remote BRAO; mild disc edema improved at 4 months since last I'VE OD  - recommend holding IVE today OD (12.08.2025) w/ f/u in 4 wks - IVE informed consent obtained and signed, 05.20.25 (OU) - f/u 4 weeks, DFE, OCT, possible injection  3,4. Hypertensive retinopathy OU  - pt and son report recent changes in BP meds - discussed importance of tight BP control and likely relationship with BRVOs  5. Mixed Cataract OU - The symptoms of cataract, surgical options, and treatments and risks were discussed with patient. - discussed diagnosis and  progression - likely visually significant - under the expert management of Dr. Cleatus  - had appt with Dr. Alena on November 10, 2023 who did not recommend cataract surgery at this time - clear from a retina standpoint to proceed with cataract surgery when pt and surgeon are ready  - would recommend timing cataract surgery ~1-2 wks after anti-VEGF injection  Ophthalmic Meds Ordered this visit:  No orders of the defined types were placed in this encounter.    No follow-ups on file.  There are no Patient Instructions on file for this visit.  This document serves as a record of services personally performed by Redell JUDITHANN Hans, MD, PhD. It was created on their behalf by Wanda GEANNIE Keens, COT an ophthalmic technician. The creation of this record is the provider's dictation and/or activities during the visit.    Electronically signed by:  Wanda GEANNIE Keens, COT  11/27/2024 7:22 AM    Redell JUDITHANN Hans, M.D., Ph.D. Diseases & Surgery of the Retina and Vitreous [facility]   Abbreviations: M myopia (nearsighted); A astigmatism; H hyperopia (farsighted); P presbyopia; Mrx spectacle prescription;  CTL contact lenses; OD right eye; OS left eye; OU both eyes  XT exotropia; ET esotropia; PEK punctate epithelial keratitis; PEE punctate epithelial erosions; DES dry eye syndrome; MGD meibomian gland dysfunction; ATs artificial tears; PFAT's preservative free artificial tears; NSC nuclear sclerotic cataract; PSC posterior subcapsular cataract; ERM epi-retinal membrane; PVD posterior vitreous detachment; RD retinal detachment; DM diabetes mellitus; DR diabetic retinopathy; NPDR non-proliferative diabetic retinopathy; PDR proliferative diabetic retinopathy; CSME clinically significant macular edema; DME diabetic macular edema; dbh dot blot hemorrhages; CWS cotton wool spot; POAG primary open angle glaucoma; C/D cup-to-disc ratio; HVF humphrey visual field; GVF goldmann visual  field; OCT optical coherence tomography; IOP intraocular pressure; BRVO Branch retinal vein occlusion; CRVO central retinal vein occlusion; CRAO central retinal artery occlusion; BRAO branch retinal artery occlusion; RT retinal tear; SB scleral buckle; PPV pars plana vitrectomy; VH Vitreous hemorrhage; PRP panretinal laser photocoagulation; IVK intravitreal kenalog; VMT vitreomacular traction; MH Macular hole;  NVD neovascularization of the disc; NVE neovascularization elsewhere; AREDS age related eye disease study; ARMD age related macular degeneration; POAG primary open angle glaucoma; EBMD epithelial/anterior basement membrane dystrophy; ACIOL anterior chamber intraocular lens; IOL intraocular lens; PCIOL posterior chamber intraocular lens; Phaco/IOL phacoemulsification with intraocular lens placement; PRK photorefractive keratectomy; LASIK laser assisted in situ keratomileusis;  HTN hypertension; DM diabetes mellitus; COPD chronic obstructive pulmonary disease "

## 2024-12-11 ENCOUNTER — Encounter (INDEPENDENT_AMBULATORY_CARE_PROVIDER_SITE_OTHER): Admitting: Ophthalmology

## 2024-12-11 DIAGNOSIS — H34233 Retinal artery branch occlusion, bilateral: Secondary | ICD-10-CM

## 2024-12-11 DIAGNOSIS — H34833 Tributary (branch) retinal vein occlusion, bilateral, with macular edema: Secondary | ICD-10-CM

## 2024-12-11 DIAGNOSIS — H3509 Other intraretinal microvascular abnormalities: Secondary | ICD-10-CM

## 2024-12-11 DIAGNOSIS — H35033 Hypertensive retinopathy, bilateral: Secondary | ICD-10-CM

## 2024-12-11 DIAGNOSIS — I10 Essential (primary) hypertension: Secondary | ICD-10-CM

## 2024-12-11 DIAGNOSIS — H25813 Combined forms of age-related cataract, bilateral: Secondary | ICD-10-CM

## 2024-12-12 NOTE — Progress Notes (Signed)
 " [facility] - Clinic Note  12/14/2024     CHIEF COMPLAINT Patient presents for Retina Follow Up   HISTORY OF PRESENT ILLNESS: Shelley Mitchell is a 84 y.o. female who presents to the clinic today for:   HPI     Retina Follow Up   Patient presents with  CRVO/BRVO.  In left eye.  This started 4 weeks ago.  Duration of 4 weeks.  Since onset it is stable.  I, the attending physician,  performed the HPI with the patient and updated documentation appropriately.        Comments   4 week retina follow up BRVO OS and I'VE OD and IVV OS pt is reporting no vision changes noticed she denies any flashes has some floaters the week before christmas she saw flashes and some floaters that has since went away lasted about a week       Last edited by Valdemar Rogue, MD on 12/23/2024  3:24 PM.    Patient that she has been seeing an increase in floaters and the vision has changed.  Referring physician: Cleatus Collar, MD [address] [place],  [place] [address]  HISTORICAL INFORMATION:   Selected notes from the MEDICAL RECORD NUMBER Referred by Dr. Cleatus for concern of BDR / PDR LEE:  Ocular Hx- PMH-    CURRENT MEDICATIONS: Current Outpatient Medications (Ophthalmic Drugs)  Medication Sig   latanoprost  (XALATAN ) 0.005 % ophthalmic solution Place 1 drop into both eyes at bedtime.   No current facility-administered medications for this visit. (Ophthalmic Drugs)   Current Outpatient Medications (Other)  Medication Sig   acetaminophen  (TYLENOL ) 500 MG tablet Take 1,000 mg by mouth every 12 (twelve) hours.   albuterol  (VENTOLIN  HFA) 108 (90 Base) MCG/ACT inhaler Inhale 2 puffs into the lungs every 8 (eight) hours as needed for wheezing or shortness of breath.   Amino Acids-Protein Hydrolys (PRO-STAT AWC) LIQD Take 30 mLs by mouth 2 (two) times daily between meals.   amLODipine  (NORVASC ) 10 MG tablet Take 1 tablet (10 mg total) by mouth daily.   aspirin EC 81 MG  tablet Take 81 mg by mouth daily. Swallow whole.   furosemide  (LASIX ) 20 MG tablet Take 20 mg by mouth daily.   hydrALAZINE  (APRESOLINE ) 50 MG tablet Take 1 tablet (50 mg total) by mouth 2 (two) times daily.   levothyroxine (SYNTHROID) 25 MCG tablet Take 25 mcg by mouth daily before breakfast.   losartan  (COZAAR ) 100 MG tablet Take 1 tablet (100 mg total) by mouth daily.   lubiprostone  (AMITIZA ) 8 MCG capsule Take 1 capsule (8 mcg total) by mouth daily with breakfast.   medroxyPROGESTERone  (PROVERA ) 10 MG tablet TAKE 1 TABLET BY MOUTH DAILY 10 DAYS PER MONTH   Multiple Vitamins-Minerals (MULTIVITAMIN WITH MINERALS) tablet Take 1 tablet by mouth daily.   nystatin powder Apply 1 Application topically 3 (three) times daily.   omeprazole  (PRILOSEC) 20 MG capsule Take 1 capsule (20 mg total) by mouth daily.   polyethylene glycol powder (GLYCOLAX /MIRALAX ) 17 GM/SCOOP powder Take 17 g by mouth daily.   pravastatin  (PRAVACHOL ) 40 MG tablet Take 40 mg by mouth at bedtime.   sertraline  (ZOLOFT ) 100 MG tablet Take 100 mg by mouth daily.   tirzepatide  5 MG/0.5ML injection vial Inject 5 mg into the skin once a week.   No current facility-administered medications for this visit. (Other)   REVIEW OF SYSTEMS: ROS   Positive for: Musculoskeletal, Cardiovascular, Eyes, Respiratory Last edited by  Resa Delon ORN, COT on 12/14/2024  7:57 AM.        ALLERGIES Allergies  Allergen Reactions   Ace Inhibitors Itching and Hives   Nsaids Itching   PAST MEDICAL HISTORY Past Medical History:  Diagnosis Date   Endometrial polyp    Dr. Jayne   History of endometrial hyperplasia    HTN (hypertension)    Hx of adenomatous colonic polyps 1999   Hyperlipidemia    Mental disorder    GAD   Obesity    OSA on CPAP    follows with [facility]   Osteoarthritis    Tachycardia    subsequently with bradycardia (?secondary to meds)   Past Surgical History:  Procedure Laterality Date   BACK SURGERY      bilateral knee replacements  2002/2003   CHOLECYSTECTOMY     COLONOSCOPY  03/2002   Dr. Riesa internal hemorrhoids   COLONOSCOPY  06/21/2011   polyp at IC valve (tubular adenoma)   COLONOSCOPY N/A 07/15/2016   one 8 mm polyp at hepatic flexure (tubular adenoma), on 12 mm polyp at hepatic flexure (tubular adenoma), 3 year surveillance if health permits   COLONOSCOPY N/A 10/10/2019   descending colon diverticulosis, two 4-5 mm polyps at splenic flexure (tubular adenomas). No surveillance due to age.    LUMBAR LAMINECTOMY/DECOMPRESSION MICRODISCECTOMY N/A 04/27/2022   Procedure: Thoracic Eleven-Twelve Decompression;  Surgeon: Gillie Duncans, MD;  Location: [facility] OR;  Service: Neurosurgery;  Laterality: N/A;   POLYPECTOMY  07/15/2016   Procedure: POLYPECTOMY;  Surgeon: Lamar CHRISTELLA Hollingshead, MD;  Location: [facility] SUITE;  Service: Endoscopy;;  Splenic Flexure polyps x 2 removed via hot snare   POLYPECTOMY  10/10/2019   Procedure: POLYPECTOMY;  Surgeon: Hollingshead Lamar CHRISTELLA, MD;  Location: [facility];  Service: Endoscopy;;   TUBAL LIGATION     FAMILY HISTORY Family History  Problem Relation Age of Onset   Diabetes Brother    Colon polyps Brother    Diabetes Sister    Cancer Father        Likely prostate   Stroke Mother 27   Colon cancer Neg Hx    SOCIAL HISTORY Social History   Tobacco Use   Smoking status: Never   Smokeless tobacco: Never  Vaping Use   Vaping status: Never Used  Substance Use Topics   Alcohol use: No    Alcohol/week: 0.0 standard drinks of alcohol   Drug use: No       OPHTHALMIC EXAM:  Base Eye Exam     Visual Acuity (Snellen - Linear)       Right Left   Dist cc 20/40 -3 20/250   Dist ph cc NI NI    Correction: Glasses         Tonometry (Tonopen, 8:04 AM)       Right Left   Pressure 14 14         Pupils       Pupils Dark Light Shape React APD   Right PERRL 3 2 Round Brisk None   Left PERRL 3 2 Round Brisk None         Visual Fields        Left Right    Full Full         Extraocular Movement       Right Left    Full, Ortho Full, Ortho         Neuro/Psych     Oriented x3: Yes   Mood/Affect: Normal  Dilation     Both eyes: 2.5% Phenylephrine  @ 8:04 AM           Slit Lamp and Fundus Exam     Slit Lamp Exam       Right Left   Lids/Lashes Dermatochalasis - upper lid Dermatochalasis - upper lid   Conjunctiva/Sclera mild melanosis mild melanosis   Cornea arcus arcus   Anterior Chamber deep and clear deep and clear   Iris Round and dilated Round and dilated   Lens 2+ Nuclear sclerosis, 2+ Cortical cataract 2-3+ Nuclear sclerosis, 2-3+ Cortical cataract   Anterior Vitreous Vitreous syneresis, Posterior vitreous detachment, vitreous condensations Vitreous syneresis, Posterior vitreous detachment         Fundus Exam       Right Left   Disc nasal hyperemia-improved, sharp rim, PPA, mild temporal pallor, no heme, disc elevation, but no frank disc edema Pink and Sharp, mild tilt, +PPA, no edema, focal hyperemia superiorly--improved   C/D Ratio 0.4 0.3   Macula Flat, good foveal reflex, inferior macula atrophic, RPE mottling and clumping, subretinal heme and edema superior macula -- stably improved, punctate exudates superior macula--stably improved Blunted foveal reflex, persistent central and inferior IRH and exudates, central cystic changes--improved, persistent intra and pre retinal heme nasal mac, mild ERM, interval improvement in subretinal heme superior mac-- turning white   Vessels attenuated, Tortuous, macroanuerysm with heme along ST arcades -- improving attenuated, Tortuous, retinal macroaneurysms along IT arcades--improved   Periphery Attached, scattered MA / DBH Attached, scattered IRH--improved/essentially resolved, cluster of exudates nasal to disc.           Refraction     Wearing Rx       Sphere Cylinder Axis Add   Right -2.00 +0.75 165 +2.00   Left -1.25 +0.50 148 +2.00            IMAGING AND PROCEDURES  Imaging and Procedures for 12/14/2024  OCT, Retina - OU - Both Eyes       Right Eye Quality was good. Central Foveal Thickness: 232. Progression has been stable. Findings include normal foveal contour, no IRF, no SRF, myopic contour, intraretinal hyper-reflective material, inner retinal atrophy, outer retinal atrophy (Stable improvement in IRF/SRF/IRHM (macroanuerysm) along ST arcades, Diffuse IRA inferior macula / hemisphere -- remote BRAO; mild disc edema).   Left Eye Quality was good. Central Foveal Thickness: 163. Progression has improved. Findings include abnormal foveal contour, myopic contour, retinal drusen , subretinal hyper-reflective material, intraretinal hyper-reflective material, intraretinal fluid, subretinal fluid, outer retinal atrophy (Persistent disc elevation, ERM with central thickening and ORA; drusen, SRHM, persistent IRF/IRHM superior and temporal macula--improving; persistent prominent IRHM nasal mac -improved).   Notes *Images captured and stored on drive  Diagnosis / Impression:  OD: Stable improvement in IRF/SRF/IRHM (macroanuerysm) along ST arcades, Diffuse IRA inferior macula / hemisphere -- remote BRAO; mild disc edema OS: Persistent disc elevation, ERM with central thickening and ORA; drusen, SRHM, persistent IRF/IRHM superior and temporal macula--improving; persistent prominent IRHM nasal mac -improved  Clinical management:  See below  Abbreviations: NFP - Normal foveal profile. CME - cystoid macular edema. PED - pigment epithelial detachment. IRF - intraretinal fluid. SRF - subretinal fluid. EZ - ellipsoid zone. ERM - epiretinal membrane. ORA - outer retinal atrophy. ORT - outer retinal tubulation. SRHM - subretinal hyper-reflective material. IRHM - intraretinal hyper-reflective material      Intravitreal Injection, Pharmacologic Agent - OS - Left Eye       Time Out 12/14/2024. 9:08 AM. Confirmed  correct patient,  procedure, site, and patient consented.   Anesthesia Topical anesthesia was used. Anesthetic medications included Lidocaine  2%, Proparacaine 0.5%.   Procedure Preparation included 5% betadine to ocular surface, eyelid speculum. A supplied (33g) needle was used.   Injection: 6 mg faricimab -svoa 6 MG/0.05ML (Patient supplied)   Route: Intravitreal, Site: Left Eye   NDC: 49757-903-93, Lot: [id number], Expiration date: 01/05/2026, Waste: 0 mL   Post-op Post injection exam found visual acuity of at least counting fingers. The patient tolerated the procedure well. There were no complications. The patient received written and verbal post procedure care education. Post injection medications were not given.   Notes **SAMPLE MEDICATION ADMINISTERED**            ASSESSMENT/PLAN:    ICD-10-CM   1. 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 Essential hypertension  I10     4. Hypertensive retinopathy of both eyes  H35.033     5. Combined forms of age-related cataract of both eyes  H25.813      1-2. BRVO w/ CME and retinal macronauerysm OS - s/p IVA OS #1 (12.15.23) #2 (01.12.24), #3 (02.09.24), #4 (03.15.24), #5 (04.12.24), #6 (06.07.24) ============================= - s/p IVE OS #1 (05.10.24), #2 (07.08.24) #3 (08.9.24), #4 (09.09.24), #5 (10.11.24), #6 (11.15.24), #7 (12.20.24) #8 (01.24.25), #9 (02.28.25), #10 (04.02.25) #11 (05.20.25), #12 (06.20.25), #13 (07.18.25) ============================= - s/p IVV OS #1 (08.15.25), #2 (09.12.25), #3 (10.13.25), #4 (11.10.25), #5 (12.08.25) - pt previously followed with Dr. Lynwood in [place], [place], but now lives in a facility in [place] and cannot be transported to [facility] - history of laser photoablation of  retinal macroaneurysm OS per chart review - FA 12.15.23 shows focal MAs w/ late leakage - BCVA OS 20/250 from CF@3 '  - exam shows macroaneurysm with surrounding circinate exudates and edema, inferior macula OS -- improved; but new IRH / macroaneurysm nasal mac -- increased w/ new SRH - OCT shows OS: Persistent disc elevation, ERM with central thickening and ORA; drusen, SRHM, persistent IRF/IRHM superior and temporal macula--improving; persistent prominent IRHM nasal mac -improved at 4 weeks - recommend IVV OS SAMPLE #6 (01.09.26), today w/ f/u in 4 wks - RBA of procedure discussed, questions answered - IVE informed consent obtained and signed, 05.20.25 (OU) - IVV informed consent obtained and signed, 08.15.25 (OU) - see procedure note **new BRAO superior macula/fovea OS on 01.24.25 visit** - pt reported 3 day history of central visual field loss w/ extension to inferior paracentral VF  - pt reports recent spikes in SBP >200  - discussed findings, prognosis  - f/u 4 weeks, DFE, OCT, possible injection  **Pt approved for Eylea  and Vabysmo  is pending**  1-2. 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 - s/p IVA OD #1 (06.07.24) #2 (07.08.24), #3 (08.09.24), #4 (09.06.24), #5 (10.10.24), #6 (11.15.24) ============== - s/p IVE OD #1 (05.20.25), #2 (06.20.25), #3 (07.18.25), #4 (08.15.25) - exam shows peripapillary hemorrhage, mild NVD nasal disc -- improved; macroaneurysm w/ surrounding heme and edema along ST arcades -- improved  - FA 12.15.23 shows mild NVD nasal disc w/ leakage  - pt may benefit from PRP   - BCVA OD 20/40 stable  - OCT shows OD: Stable improvement in IRF/SRF/IRHM (macroanuerysm) along ST arcades,  Diffuse IRA inferior macula / hemisphere -- remote BRAO; mild disc edema improved at 4 months since last IVE OD  - recommend holding IVE OD today w/ f/u in 4 wks - IVE informed consent obtained and signed, 05.20.25 (OU) - f/u 4 weeks, DFE, OCT, possible injection  3,4. Hypertensive  retinopathy OU  - pt and son report recent changes in BP meds - discussed importance of tight BP control and likely relationship with BRVOs  5. Mixed Cataract OU - The symptoms of cataract, surgical options, and treatments and risks were discussed with patient. - discussed diagnosis and progression - likely visually significant - under the expert management of Dr. Cleatus  - had appt with Dr. Alena on November 10, 2023 who did not recommend cataract surgery at this time - clear from a retina standpoint to proceed with cataract surgery when pt and surgeon are ready  - would recommend timing cataract surgery ~1-2 wks after anti-VEGF injection  Ophthalmic Meds Ordered this visit:  Meds ordered this encounter  Medications   faricimab -svoa (VABYSMO ) 6mg /0.44mL intravitreal injection     Return in about 4 weeks (around 01/11/2025) for f/u, BRVO, DFE, OCT, Possible, IVV, OS.  There are no Patient Instructions on file for this visit.  This document serves as a record of services personally performed by Redell JUDITHANN Hans, MD, PhD. It was created on their behalf by Delon Newness COT, an ophthalmic technician. The creation of this record is the provider's dictation and/or activities during the visit.    Electronically signed by: Delon Newness COT 01.07.26 3:24 PM  This document serves as a record of services personally performed by Redell JUDITHANN Hans, MD, PhD. It was created on their behalf by Wanda GEANNIE Keens, COT an ophthalmic technician. The creation of this record is the provider's dictation and/or activities during the visit.    Electronically signed by:  Wanda GEANNIE Keens, COT  12/23/24 3:24 PM  Redell JUDITHANN Hans, M.D., Ph.D. Diseases & Surgery of the Retina and Vitreous [facility]  I have reviewed the above documentation for accuracy and completeness, and I agree with the above. Redell JUDITHANN Hans, M.D., Ph.D. 12/23/24 3:27 PM   Abbreviations: M myopia  (nearsighted); A astigmatism; H hyperopia (farsighted); P presbyopia; Mrx spectacle prescription;  CTL contact lenses; OD right eye; OS left eye; OU both eyes  XT exotropia; ET esotropia; PEK punctate epithelial keratitis; PEE punctate epithelial erosions; DES dry eye syndrome; MGD meibomian gland dysfunction; ATs artificial tears; PFAT's preservative free artificial tears; NSC nuclear sclerotic cataract; PSC posterior subcapsular cataract; ERM epi-retinal membrane; PVD posterior vitreous detachment; RD retinal detachment; DM diabetes mellitus; DR diabetic retinopathy; NPDR non-proliferative diabetic retinopathy; PDR proliferative diabetic retinopathy; CSME clinically significant macular edema; DME diabetic macular edema; dbh dot blot hemorrhages; CWS cotton wool spot; POAG primary open angle glaucoma; C/D cup-to-disc ratio; HVF humphrey visual field; GVF goldmann visual field; OCT optical coherence tomography; IOP intraocular pressure; BRVO Branch retinal vein occlusion; CRVO central retinal vein occlusion; CRAO central retinal artery occlusion; BRAO branch retinal artery occlusion; RT retinal tear; SB scleral buckle; PPV pars plana vitrectomy; VH Vitreous hemorrhage; PRP panretinal laser photocoagulation; IVK intravitreal kenalog; VMT vitreomacular traction; MH Macular hole;  NVD neovascularization of the disc; NVE neovascularization elsewhere; AREDS age related eye disease study; ARMD age related macular degeneration; POAG primary open angle glaucoma; EBMD epithelial/anterior basement membrane dystrophy; ACIOL anterior chamber intraocular lens; IOL intraocular lens; PCIOL posterior chamber intraocular lens; Phaco/IOL phacoemulsification with intraocular lens  placement; PRK photorefractive keratectomy; LASIK laser assisted in situ keratomileusis; HTN hypertension; DM diabetes mellitus; COPD chronic obstructive pulmonary disease "

## 2024-12-14 ENCOUNTER — Encounter (INDEPENDENT_AMBULATORY_CARE_PROVIDER_SITE_OTHER): Payer: Self-pay | Admitting: Ophthalmology

## 2024-12-14 ENCOUNTER — Ambulatory Visit (INDEPENDENT_AMBULATORY_CARE_PROVIDER_SITE_OTHER): Admitting: Ophthalmology

## 2024-12-14 DIAGNOSIS — I10 Essential (primary) hypertension: Secondary | ICD-10-CM | POA: Diagnosis not present

## 2024-12-14 DIAGNOSIS — H34833 Tributary (branch) retinal vein occlusion, bilateral, with macular edema: Secondary | ICD-10-CM | POA: Diagnosis not present

## 2024-12-14 DIAGNOSIS — H3509 Other intraretinal microvascular abnormalities: Secondary | ICD-10-CM | POA: Diagnosis not present

## 2024-12-14 DIAGNOSIS — H25813 Combined forms of age-related cataract, bilateral: Secondary | ICD-10-CM

## 2024-12-14 DIAGNOSIS — H35033 Hypertensive retinopathy, bilateral: Secondary | ICD-10-CM

## 2024-12-14 MED ORDER — FARICIMAB-SVOA 6 MG/0.05ML IZ SOSY
6.0000 mg | PREFILLED_SYRINGE | INTRAVITREAL | Status: AC | PRN
  Administered 2024-12-14: 6 mg via INTRAVITREAL

## 2025-01-01 ENCOUNTER — Other Ambulatory Visit: Payer: Self-pay | Admitting: Internal Medicine

## 2025-01-01 DIAGNOSIS — R21 Rash and other nonspecific skin eruption: Secondary | ICD-10-CM

## 2025-01-11 ENCOUNTER — Ambulatory Visit (INDEPENDENT_AMBULATORY_CARE_PROVIDER_SITE_OTHER): Admitting: Ophthalmology

## 2025-01-11 ENCOUNTER — Encounter (INDEPENDENT_AMBULATORY_CARE_PROVIDER_SITE_OTHER): Payer: Self-pay | Admitting: Ophthalmology

## 2025-01-11 DIAGNOSIS — H3509 Other intraretinal microvascular abnormalities: Secondary | ICD-10-CM

## 2025-01-11 DIAGNOSIS — I10 Essential (primary) hypertension: Secondary | ICD-10-CM

## 2025-01-11 DIAGNOSIS — H25813 Combined forms of age-related cataract, bilateral: Secondary | ICD-10-CM

## 2025-01-11 DIAGNOSIS — H34833 Tributary (branch) retinal vein occlusion, bilateral, with macular edema: Secondary | ICD-10-CM

## 2025-01-11 DIAGNOSIS — H35033 Hypertensive retinopathy, bilateral: Secondary | ICD-10-CM

## 2025-01-11 MED ORDER — FARICIMAB-SVOA 6 MG/0.05ML IZ SOSY
6.0000 mg | PREFILLED_SYRINGE | INTRAVITREAL | Status: AC | PRN
  Administered 2025-01-11: 6 mg via INTRAVITREAL

## 2025-01-16 ENCOUNTER — Encounter

## 2025-01-16 ENCOUNTER — Other Ambulatory Visit

## 2025-02-04 ENCOUNTER — Ambulatory Visit: Admitting: Cardiology

## 2025-02-08 ENCOUNTER — Encounter (INDEPENDENT_AMBULATORY_CARE_PROVIDER_SITE_OTHER): Admitting: Ophthalmology

## 2025-04-03 ENCOUNTER — Ambulatory Visit: Admitting: Dermatology
